# Patient Record
Sex: Male | Born: 1955 | Race: White | Hispanic: No | Smoking: Former smoker
Health system: Southern US, Community
[De-identification: ages and names within clinical notes are randomized; demographics above are authoritative.]

## PROBLEM LIST (undated history)

## (undated) DIAGNOSIS — M1990 Unspecified osteoarthritis, unspecified site: Secondary | ICD-10-CM

## (undated) DIAGNOSIS — I10 Essential (primary) hypertension: Secondary | ICD-10-CM

## (undated) HISTORY — PX: TOE FUSION: SHX1070

## (undated) HISTORY — PX: BACK SURGERY: SHX140

## (undated) HISTORY — PX: APPENDECTOMY: SHX54

## (undated) HISTORY — PX: OTHER SURGICAL HISTORY: SHX169

---

## 1997-04-06 HISTORY — PX: LUMBAR FUSION: SHX111

## 2007-07-28 ENCOUNTER — Ambulatory Visit: Payer: Self-pay | Admitting: Gastroenterology

## 2011-09-11 ENCOUNTER — Ambulatory Visit: Payer: Self-pay | Admitting: Orthopedic Surgery

## 2013-03-06 ENCOUNTER — Ambulatory Visit: Payer: Self-pay | Admitting: Family Medicine

## 2014-06-14 ENCOUNTER — Ambulatory Visit: Payer: Self-pay | Admitting: Family Medicine

## 2014-06-22 ENCOUNTER — Other Ambulatory Visit: Payer: Self-pay | Admitting: Orthopedic Surgery

## 2014-07-16 NOTE — H&P (Signed)
TOTAL KNEE ADMISSION H&P  Patient is being admitted for left total knee arthroplasty.  Subjective:  Chief Complaint:left knee pain.  HPI: Lawrence PaulsMarshall R Espy Jr., 59 y.o. male, has a history of pain and functional disability in the left knee due to arthritis and has failed non-surgical conservative treatments for greater than 12 weeks to includeNSAID's and/or analgesics, corticosteriod injections, viscosupplementation injections, flexibility and strengthening excercises, weight reduction as appropriate and activity modification.  Onset of symptoms was gradual, starting 4 years ago with gradually worsening course since that time. The patient noted prior procedures on the knee to include  nothing on the left knee(s).  Patient currently rates pain in the left knee(s) at 7 out of 10 with activity. Patient has night pain, worsening of pain with activity and weight bearing, pain that interferes with activities of daily living, pain with passive range of motion and joint swelling.  Patient has evidence of subchondral sclerosis, joint subluxation and joint space narrowing by imaging studies. This patient has had osteoarthritis. There is no active infection.  There are no active problems to display for this patient.  No past medical history on file.  No past surgical history on file.  No prescriptions prior to admission   Allergies not on file  History  Substance Use Topics  . Smoking status: Not on file  . Smokeless tobacco: Not on file  . Alcohol Use: Not on file    No family history on file.   Review of Systems  Constitutional: Negative.   HENT: Negative.   Eyes: Negative.   Respiratory: Negative.   Cardiovascular: Negative.   Gastrointestinal: Negative.   Genitourinary: Negative.   Musculoskeletal: Positive for back pain and joint pain.  Skin: Negative.   Neurological: Negative.   Endo/Heme/Allergies: Negative.     Objective:  Physical Exam  Constitutional: He is oriented to  person, place, and time. He appears well-developed.  HENT:  Head: Normocephalic.  Eyes: EOM are normal.  Neck: Normal range of motion.  Cardiovascular: Normal rate, regular rhythm, normal heart sounds and intact distal pulses.   Respiratory: Effort normal.  GI: Soft.  Genitourinary:  Deferred  Musculoskeletal:  Back pain with ROM. R&L knee pain with ROM. 4/5 strength with knee ROM. Calves soft and non-tender. Bilateral lower extremities grossly N/v intact.  Neurological: He is alert and oriented to person, place, and time. He has normal reflexes.  Skin: Skin is warm and dry.  Psychiatric: His behavior is normal.    Vital signs in last 24 hours:    Labs:   There is no height or weight on file to calculate BMI.   Imaging Review Plain radiographs demonstrate moderate degenerative joint disease of the left knee(s). The overall alignment ismild varus. The bone quality appears to be good for age and reported activity level.  Assessment/Plan:  End stage arthritis, left knee   The patient history, physical examination, clinical judgment of the provider and imaging studies are consistent with end stage degenerative joint disease of the left knee(s) and total knee arthroplasty is deemed medically necessary. The treatment options including medical management, injection therapy arthroscopy and arthroplasty were discussed at length. The risks and benefits of total knee arthroplasty were presented and reviewed. The risks due to aseptic loosening, infection, stiffness, patella tracking problems, thromboembolic complications and other imponderables were discussed. The patient acknowledged the explanation, agreed to proceed with the plan and consent was signed. Patient is being admitted for inpatient treatment for surgery, pain control, PT, OT, prophylactic antibiotics,  VTE prophylaxis, progressive ambulation and ADL's and discharge planning. The patient is planning to be discharged home with home  health services.  Contraindications and adverse affects of Tranexamic acid discussed in detail. Patient denies any of these at this time and understands the risks and benefits.

## 2014-08-01 ENCOUNTER — Other Ambulatory Visit (HOSPITAL_COMMUNITY): Payer: Self-pay | Admitting: *Deleted

## 2014-08-01 NOTE — Patient Instructions (Addendum)
Lawrence PaulsMarshall R Mcmullan Jr.  08/01/2014   Your procedure is scheduled on: Friday May 6th, 2015  [facility] and follow signs to               Short Stay Center at AM.  Call this number if you have problems the morning of surgery [phone]   Remember:  Do not eat food or drink liquids :After Midnight.     Take these medicines the morning of surgery with A SIP OF WATER: no meds to take                               You may not have any metal on your body including hair pins and              piercings  Do not wear jewelry, make-up, lotions, powders or perfumes.             Do not wear nail polish.  Do not shave  48 hours prior to surgery.              Men may shave face and neck.   Do not bring valuables to the hospital. [facility] IS NOT             RESPONSIBLE   FOR VALUABLES.  Contacts, dentures or bridgework may not be worn into surgery.  Leave suitcase in the car. After surgery it may be brought to your room.                 Please read over the following fact sheets you were given: _____________________________________________________________________             [facility] - Preparing [facility] Before surgery, you can play an important role.  Because skin is not sterile, your skin needs to be as free of germs as possible.  You can reduce the number of germs on your skin by washing with CHG (chlorahexidine gluconate) soap before surgery.  CHG is an antiseptic cleaner which kills germs and bonds with the skin to continue killing germs even after washing. Please DO NOT use if you have an allergy to CHG or antibacterial soaps.  If your skin becomes reddened/irritated stop using the CHG and inform your nurse when you arrive at Short Stay. Do not shave (including legs and underarms) for at least 48 hours prior to the first CHG shower.  You may shave your face/neck. Please follow these instructions carefully:  1.  Shower with CHG Soap  the night before surgery and the  morning of Surgery.  2.  If you choose to wash your hair, wash your hair first as usual with your  normal  shampoo.  3.  After you shampoo, rinse your hair and body thoroughly to remove the  shampoo.                           4.  Use CHG as you would any other liquid soap.  You can apply chg directly  to the skin and wash                       Gently with a scrungie or clean washcloth.  5.  Apply the CHG Soap to your body ONLY FROM THE NECK DOWN.  Do not use on face/ open                           Wound or open sores. Avoid contact with eyes, ears mouth and genitals (private parts).                       Wash face,  Genitals (private parts) with your normal soap.             6.  Wash thoroughly, paying special attention to the area where your surgery  will be performed.  7.  Thoroughly rinse your body with warm water from the neck down.  8.  DO NOT shower/wash with your normal soap after using and rinsing off  the CHG Soap.                9.  Pat yourself dry with a clean towel.            10.  Wear clean pajamas.            11.  Place clean sheets on your bed the night of your first shower and do not  sleep with pets. Day of Surgery : Do not apply any lotions/deodorants the morning of surgery.  Please wear clean clothes to the hospital/surgery center.  FAILURE TO FOLLOW THESE INSTRUCTIONS MAY RESULT IN THE CANCELLATION OF YOUR SURGERY PATIENT SIGNATURE_________________________________  NURSE SIGNATURE__________________________________  ________________________________________________________________________  WHAT IS A BLOOD TRANSFUSION? Blood Transfusion Information  A transfusion is the replacement of blood or some of its parts. Blood is made up of multiple cells which provide different functions.  Red blood cells carry oxygen and are used for blood loss replacement.  White blood cells fight against infection.  Platelets control bleeding.  Plasma  helps clot blood.  Other blood products are available for specialized needs, such as hemophilia or other clotting disorders. BEFORE THE TRANSFUSION  Who gives blood for transfusions?   Healthy volunteers who are fully evaluated to make sure their blood is safe. This is blood bank blood. Transfusion therapy is the safest it has ever been in the practice of medicine. Before blood is taken from a donor, a complete history is taken to make sure that person has no history of diseases nor engages in risky social behavior (examples are intravenous drug use or sexual activity with multiple partners). The donor's travel history is screened to minimize risk of transmitting infections, such as malaria. The donated blood is tested for signs of infectious diseases, such as HIV and hepatitis. The blood is then tested to be sure it is compatible with you in order to minimize the chance of a transfusion reaction. If you or a relative donates blood, this is often done in anticipation of surgery and is not appropriate for emergency situations. It takes many days to process the donated blood. RISKS AND COMPLICATIONS Although transfusion therapy is very safe and saves many lives, the main dangers of transfusion include:  1. Getting an infectious disease. 2. Developing a transfusion reaction. This is an allergic reaction to something in the blood you were given. Every precaution is taken to prevent this. The decision to have a blood transfusion has been considered carefully by your caregiver before blood is given. Blood is not given unless the benefits outweigh the risks. AFTER THE TRANSFUSION  Right after receiving a blood transfusion, you will usually feel much better and more energetic. This is especially  true if your red blood cells have gotten low (anemic). The transfusion raises the level of the red blood cells which carry oxygen, and this usually causes an energy increase.  The nurse administering the transfusion  will monitor you carefully for complications. HOME CARE INSTRUCTIONS  No special instructions are needed after a transfusion. You may find your energy is better. Speak with your caregiver about any limitations on activity for underlying diseases you may have. SEEK MEDICAL CARE IF:   Your condition is not improving after your transfusion.  You develop redness or irritation at the intravenous (IV) site. SEEK IMMEDIATE MEDICAL CARE IF:  Any of the following symptoms occur over the next 12 hours:  Shaking chills.  You have a temperature by mouth above 102 F (38.9 C), not controlled by medicine.  Chest, back, or muscle pain.  People around you feel you are not acting correctly or are confused.  Shortness of breath or difficulty breathing.  Dizziness and fainting.  You get a rash or develop hives.  You have a decrease in urine output.  Your urine turns a dark color or changes to pink, red, or brown. Any of the following symptoms occur over the next 10 days:  You have a temperature by mouth above 102 F (38.9 C), not controlled by medicine.  Shortness of breath.  Weakness after normal activity.  The white part of the eye turns yellow (jaundice).  You have a decrease in the amount of urine or are urinating less often.  Your urine turns a dark color or changes to pink, red, or brown. Document Released: 03/20/2000 Document Revised: 06/15/2011 Document Reviewed: 11/07/2007 ExitCare Patient Information 2014 [place].  _______________________________________________________________________  Incentive Spirometer  An incentive spirometer is a tool that can help keep your lungs clear and active. This tool measures how well you are filling your lungs with each breath. Taking long deep breaths may help reverse or decrease the chance of developing breathing (pulmonary) problems (especially infection) following:  A long period of time when you are unable to move or be  active. BEFORE THE PROCEDURE   If the spirometer includes an indicator to show your best effort, your nurse or respiratory therapist will set it to a desired goal.  If possible, sit up straight or lean slightly forward. Try not to slouch.  Hold the incentive spirometer in an upright position. INSTRUCTIONS FOR USE  3. Sit on the edge of your bed if possible, or sit up as far as you can in bed or on a chair. 4. Hold the incentive spirometer in an upright position. 5. Breathe out normally. 6. Place the mouthpiece in your mouth and seal your lips tightly around it. 7. Breathe in slowly and as deeply as possible, raising the piston or the ball toward the top of the column. 8. Hold your breath for 3-5 seconds or for as long as possible. Allow the piston or ball to fall to the bottom of the column. 9. Remove the mouthpiece from your mouth and breathe out normally. 10. Rest for a few seconds and repeat Steps 1 through 7 at least 10 times every 1-2 hours when you are awake. Take your time and take a few normal breaths between deep breaths. 11. The spirometer may include an indicator to show your best effort. Use the indicator as a goal to work toward during each repetition. 12. After each set of 10 deep breaths, practice coughing to be sure your lungs are clear. If you have  an incision (the cut made at the time of surgery), support your incision when coughing by placing a pillow or rolled up towels firmly against it. Once you are able to get out of bed, walk around indoors and cough well. You may stop using the incentive spirometer when instructed by your caregiver.  RISKS AND COMPLICATIONS  Take your time so you do not get dizzy or light-headed.  If you are in pain, you may need to take or ask for pain medication before doing incentive spirometry. It is harder to take a deep breath if you are having pain. AFTER USE  Rest and breathe slowly and easily.  It can be helpful to keep track of a log of  your progress. Your caregiver can provide you with a simple table to help with this. If you are using the spirometer at home, follow these instructions: [place] IF:   You are having difficultly using the spirometer.  You have trouble using the spirometer as often as instructed.  Your pain medication is not giving enough relief while using the spirometer.  You develop fever of 100.5 F (38.1 C) or higher. SEEK IMMEDIATE MEDICAL CARE IF:   You cough up bloody sputum that had not been present before.  You develop fever of 102 F (38.9 C) or greater.  You develop worsening pain at or near the incision site. MAKE SURE YOU:   Understand these instructions.  Will watch your condition.  Will get help right away if you are not doing well or get worse. Document Released: 08/03/2006 Document Revised: 06/15/2011 Document Reviewed: 10/04/2006 [facility] Patient Information 2014 [place], [place].   ________________________________________________________________________

## 2014-08-02 ENCOUNTER — Encounter (HOSPITAL_COMMUNITY): Payer: Self-pay

## 2014-08-02 ENCOUNTER — Encounter (HOSPITAL_COMMUNITY)
Admission: RE | Admit: 2014-08-02 | Discharge: 2014-08-02 | Disposition: A | Discharge status: Home / Self Care | Payer: BLUE CROSS/BLUE SHIELD | Source: Ambulatory Visit | Attending: Specialist | Admitting: Specialist

## 2014-08-02 DIAGNOSIS — Z01812 Encounter for preprocedural laboratory examination: Secondary | ICD-10-CM | POA: Diagnosis present

## 2014-08-02 DIAGNOSIS — M1712 Unilateral primary osteoarthritis, left knee: Secondary | ICD-10-CM | POA: Diagnosis not present

## 2014-08-02 HISTORY — DX: Essential (primary) hypertension: I10

## 2014-08-02 HISTORY — DX: Unspecified osteoarthritis, unspecified site: M19.90

## 2014-08-02 LAB — SURGICAL PCR SCREEN
MRSA, PCR: NEGATIVE
Staphylococcus aureus: POSITIVE — AB

## 2014-08-02 LAB — URINALYSIS, ROUTINE W REFLEX MICROSCOPIC
Bilirubin Urine: NEGATIVE
GLUCOSE, UA: NEGATIVE mg/dL
Hgb urine dipstick: NEGATIVE
KETONES UR: NEGATIVE mg/dL
Leukocytes, UA: NEGATIVE
NITRITE: NEGATIVE
Protein, ur: NEGATIVE mg/dL
Specific Gravity, Urine: 1.009 (ref 1.005–1.030)
UROBILINOGEN UA: 0.2 mg/dL (ref 0.0–1.0)
pH: 6 (ref 5.0–8.0)

## 2014-08-02 LAB — PROTIME-INR
INR: 0.99 (ref 0.00–1.49)
Prothrombin Time: 13.2 seconds (ref 11.6–15.2)

## 2014-08-02 LAB — CBC
HCT: 40.6 % (ref 39.0–52.0)
HEMOGLOBIN: 13.4 g/dL (ref 13.0–17.0)
MCH: 29.8 pg (ref 26.0–34.0)
MCHC: 33 g/dL (ref 30.0–36.0)
MCV: 90.4 fL (ref 78.0–100.0)
PLATELETS: 236 10*3/uL (ref 150–400)
RBC: 4.49 MIL/uL (ref 4.22–5.81)
RDW: 12.7 % (ref 11.5–15.5)
WBC: 7.3 10*3/uL (ref 4.0–10.5)

## 2014-08-02 LAB — BASIC METABOLIC PANEL
ANION GAP: 8 (ref 5–15)
BUN: 16 mg/dL (ref 6–23)
CALCIUM: 9.6 mg/dL (ref 8.4–10.5)
CHLORIDE: 105 mmol/L (ref 96–112)
CO2: 25 mmol/L (ref 19–32)
CREATININE: 0.8 mg/dL (ref 0.50–1.35)
GFR calc Af Amer: >90 mL/min (ref >90)
GFR calc non Af Amer: >90 mL/min (ref >90)
GLUCOSE: 106 mg/dL — AB (ref 70–99)
Potassium: 4.6 mmol/L (ref 3.5–5.1)
Sodium: 138 mmol/L (ref 135–145)

## 2014-08-02 LAB — ABO/RH: ABO/RH(D): B POS

## 2014-08-02 LAB — APTT: aPTT: 33 seconds (ref 24–37)

## 2014-08-08 NOTE — Progress Notes (Signed)
Spoke with patient by phone, patient advised RN blood braclet got wet, but had dried and was readable, patient advised to keep blood braclet dry as possible prior to surgery and short stay would check blood braclet. day of surgery

## 2014-08-09 MED ORDER — DEXTROSE 5 % IV SOLN
3.0000 g | INTRAVENOUS | Status: AC
  Administered 2014-08-10: 3 g via INTRAVENOUS
  Filled 2014-08-09 (×2): qty 3000

## 2014-08-10 ENCOUNTER — Encounter (HOSPITAL_COMMUNITY): Payer: Self-pay | Admitting: *Deleted

## 2014-08-10 ENCOUNTER — Inpatient Hospital Stay (HOSPITAL_COMMUNITY): Payer: BLUE CROSS/BLUE SHIELD | Admitting: Anesthesiology

## 2014-08-10 ENCOUNTER — Inpatient Hospital Stay (HOSPITAL_COMMUNITY)
Admission: RE | Admit: 2014-08-10 | Discharge: 2014-08-12 | DRG: 470 | Disposition: A | Discharge status: Home Health | Payer: BLUE CROSS/BLUE SHIELD | Source: Ambulatory Visit | Attending: Specialist | Admitting: Specialist

## 2014-08-10 ENCOUNTER — Encounter (HOSPITAL_COMMUNITY)
Admission: RE | Disposition: A | Discharge status: Home Health | Payer: Self-pay | Source: Ambulatory Visit | Attending: Specialist

## 2014-08-10 DIAGNOSIS — Z01812 Encounter for preprocedural laboratory examination: Secondary | ICD-10-CM

## 2014-08-10 DIAGNOSIS — E669 Obesity, unspecified: Secondary | ICD-10-CM | POA: Diagnosis present

## 2014-08-10 DIAGNOSIS — M25562 Pain in left knee: Secondary | ICD-10-CM | POA: Diagnosis present

## 2014-08-10 DIAGNOSIS — I10 Essential (primary) hypertension: Secondary | ICD-10-CM | POA: Diagnosis present

## 2014-08-10 DIAGNOSIS — M1712 Unilateral primary osteoarthritis, left knee: Principal | ICD-10-CM | POA: Diagnosis present

## 2014-08-10 DIAGNOSIS — Z6837 Body mass index (BMI) 37.0-37.9, adult: Secondary | ICD-10-CM | POA: Diagnosis not present

## 2014-08-10 HISTORY — PX: TOTAL KNEE ARTHROPLASTY: SHX125

## 2014-08-10 LAB — TYPE AND SCREEN
ABO/RH(D): B POS
Antibody Screen: NEGATIVE

## 2014-08-10 SURGERY — ARTHROPLASTY, KNEE, TOTAL
Anesthesia: Spinal | Site: Knee | Laterality: Left

## 2014-08-10 MED ORDER — OXYCODONE HCL ER 20 MG PO T12A
40.0000 mg | EXTENDED_RELEASE_TABLET | Freq: Two times a day (BID) | ORAL | Status: DC
  Administered 2014-08-10 – 2014-08-12 (×5): 40 mg via ORAL
  Filled 2014-08-10 (×5): qty 2

## 2014-08-10 MED ORDER — ONDANSETRON HCL 4 MG/2ML IJ SOLN
INTRAMUSCULAR | Status: DC | PRN
  Administered 2014-08-10: 4 mg via INTRAVENOUS

## 2014-08-10 MED ORDER — MIDAZOLAM HCL 2 MG/2ML IJ SOLN
INTRAMUSCULAR | Status: AC
  Filled 2014-08-10: qty 2

## 2014-08-10 MED ORDER — FENTANYL CITRATE (PF) 250 MCG/5ML IJ SOLN
INTRAMUSCULAR | Status: DC | PRN
  Administered 2014-08-10: 100 ug via INTRAVENOUS

## 2014-08-10 MED ORDER — ONDANSETRON HCL 4 MG PO TABS: 4.0000 mg | ORAL_TABLET | Freq: Four times a day (QID) | ORAL | Status: DC | PRN

## 2014-08-10 MED ORDER — ONDANSETRON HCL 4 MG/2ML IJ SOLN: 4.0000 mg | Freq: Four times a day (QID) | INTRAMUSCULAR | Status: DC | PRN

## 2014-08-10 MED ORDER — KETOROLAC TROMETHAMINE 30 MG/ML IJ SOLN
INTRAMUSCULAR | Status: AC
  Filled 2014-08-10: qty 1

## 2014-08-10 MED ORDER — BUPIVACAINE-EPINEPHRINE 0.25% -1:200000 IJ SOLN
INTRAMUSCULAR | Status: DC | PRN
  Administered 2014-08-10: 30 mL

## 2014-08-10 MED ORDER — POTASSIUM CHLORIDE IN NACL 20-0.9 MEQ/L-% IV SOLN
INTRAVENOUS | Status: DC
  Administered 2014-08-10: 14:00:00 via INTRAVENOUS
  Filled 2014-08-10 (×5): qty 1000

## 2014-08-10 MED ORDER — PROPOFOL 10 MG/ML IV BOLUS
INTRAVENOUS | Status: AC
  Filled 2014-08-10: qty 20

## 2014-08-10 MED ORDER — POLYETHYLENE GLYCOL 3350 17 G PO PACK: 17.0000 g | PACK | Freq: Every day | ORAL | Status: DC | PRN

## 2014-08-10 MED ORDER — LACTATED RINGERS IV SOLN
INTRAVENOUS | Status: DC | PRN
  Administered 2014-08-10 (×2): via INTRAVENOUS

## 2014-08-10 MED ORDER — SODIUM CHLORIDE 0.9 % IV SOLN: INTRAVENOUS | Status: DC

## 2014-08-10 MED ORDER — BUPIVACAINE HCL (PF) 0.75 % IJ SOLN
INTRAMUSCULAR | Status: DC | PRN
  Administered 2014-08-10: 15 mg

## 2014-08-10 MED ORDER — 0.9 % SODIUM CHLORIDE (POUR BTL) OPTIME
TOPICAL | Status: DC | PRN
  Administered 2014-08-10: 1000 mL

## 2014-08-10 MED ORDER — DOCUSATE SODIUM 100 MG PO CAPS
100.0000 mg | ORAL_CAPSULE | Freq: Two times a day (BID) | ORAL | Status: DC
  Administered 2014-08-10 – 2014-08-12 (×4): 100 mg via ORAL

## 2014-08-10 MED ORDER — FENTANYL CITRATE (PF) 100 MCG/2ML IJ SOLN: 25.0000 ug | INTRAMUSCULAR | Status: DC | PRN

## 2014-08-10 MED ORDER — MENTHOL 3 MG MT LOZG: 1.0000 | LOZENGE | OROMUCOSAL | Status: DC | PRN

## 2014-08-10 MED ORDER — ZOLPIDEM TARTRATE 5 MG PO TABS: 5.0000 mg | ORAL_TABLET | Freq: Every evening | ORAL | Status: DC | PRN

## 2014-08-10 MED ORDER — DEXAMETHASONE SODIUM PHOSPHATE 10 MG/ML IJ SOLN
10.0000 mg | Freq: Once | INTRAMUSCULAR | Status: AC
  Administered 2014-08-11: 10 mg via INTRAVENOUS
  Filled 2014-08-10: qty 1

## 2014-08-10 MED ORDER — METHOCARBAMOL 500 MG PO TABS
500.0000 mg | ORAL_TABLET | Freq: Four times a day (QID) | ORAL | Status: DC | PRN
  Administered 2014-08-10 – 2014-08-12 (×5): 500 mg via ORAL
  Filled 2014-08-10 (×6): qty 1

## 2014-08-10 MED ORDER — SODIUM CHLORIDE 0.9 % IJ SOLN
INTRAMUSCULAR | Status: AC
  Filled 2014-08-10: qty 50

## 2014-08-10 MED ORDER — OXYCODONE HCL 5 MG PO TABS
10.0000 mg | ORAL_TABLET | Freq: Three times a day (TID) | ORAL | Status: DC
  Administered 2014-08-10 – 2014-08-12 (×7): 10 mg via ORAL
  Filled 2014-08-10 (×8): qty 2

## 2014-08-10 MED ORDER — PHENOL 1.4 % MT LIQD
1.0000 | OROMUCOSAL | Status: DC | PRN
  Filled 2014-08-10: qty 177

## 2014-08-10 MED ORDER — LISINOPRIL 20 MG PO TABS
20.0000 mg | ORAL_TABLET | Freq: Every day | ORAL | Status: DC
  Administered 2014-08-10 – 2014-08-11 (×2): 20 mg via ORAL
  Filled 2014-08-10 (×4): qty 1

## 2014-08-10 MED ORDER — METHOCARBAMOL 1000 MG/10ML IJ SOLN
500.0000 mg | Freq: Four times a day (QID) | INTRAVENOUS | Status: DC | PRN
  Administered 2014-08-10: 500 mg via INTRAVENOUS
  Filled 2014-08-10 (×2): qty 5

## 2014-08-10 MED ORDER — ACETAMINOPHEN 325 MG PO TABS
650.0000 mg | ORAL_TABLET | Freq: Four times a day (QID) | ORAL | Status: DC | PRN
  Administered 2014-08-11 – 2014-08-12 (×2): 650 mg via ORAL
  Filled 2014-08-10 (×2): qty 2

## 2014-08-10 MED ORDER — DIPHENHYDRAMINE HCL 12.5 MG/5ML PO ELIX
12.5000 mg | ORAL_SOLUTION | ORAL | Status: DC | PRN
Start: 2014-08-10 — End: 2014-08-12

## 2014-08-10 MED ORDER — METOCLOPRAMIDE HCL 5 MG/ML IJ SOLN
5.0000 mg | Freq: Three times a day (TID) | INTRAMUSCULAR | Status: DC | PRN
Start: 2014-08-10 — End: 2014-08-12

## 2014-08-10 MED ORDER — KETOROLAC TROMETHAMINE 30 MG/ML IJ SOLN
INTRAMUSCULAR | Status: DC | PRN
  Administered 2014-08-10: 30 mg via INTRAVENOUS

## 2014-08-10 MED ORDER — ONDANSETRON HCL 4 MG/2ML IJ SOLN: 4.0000 mg | Freq: Once | INTRAMUSCULAR | Status: DC | PRN

## 2014-08-10 MED ORDER — FLUTICASONE PROPIONATE 50 MCG/ACT NA SUSP
1.0000 | Freq: Every evening | NASAL | Status: DC
  Filled 2014-08-10: qty 16

## 2014-08-10 MED ORDER — BUPIVACAINE-EPINEPHRINE (PF) 0.25% -1:200000 IJ SOLN
INTRAMUSCULAR | Status: AC
  Filled 2014-08-10: qty 30

## 2014-08-10 MED ORDER — TRANEXAMIC ACID 1000 MG/10ML IV SOLN
1000.0000 mg | INTRAVENOUS | Status: AC
  Administered 2014-08-10: 1000 mg via INTRAVENOUS
  Filled 2014-08-10: qty 10

## 2014-08-10 MED ORDER — ALUM & MAG HYDROXIDE-SIMETH 200-200-20 MG/5ML PO SUSP: 30.0000 mL | ORAL | Status: DC | PRN

## 2014-08-10 MED ORDER — ENOXAPARIN SODIUM 30 MG/0.3ML ~~LOC~~ SOLN
30.0000 mg | Freq: Two times a day (BID) | SUBCUTANEOUS | Status: DC
  Administered 2014-08-11 – 2014-08-12 (×3): 30 mg via SUBCUTANEOUS
  Filled 2014-08-10 (×5): qty 0.3

## 2014-08-10 MED ORDER — ACETAMINOPHEN 650 MG RE SUPP: 650.0000 mg | Freq: Four times a day (QID) | RECTAL | Status: DC | PRN

## 2014-08-10 MED ORDER — DOXAZOSIN MESYLATE 8 MG PO TABS
8.0000 mg | ORAL_TABLET | Freq: Every day | ORAL | Status: DC
  Administered 2014-08-10 – 2014-08-11 (×2): 8 mg via ORAL
  Filled 2014-08-10 (×4): qty 1

## 2014-08-10 MED ORDER — MIDAZOLAM HCL 5 MG/5ML IJ SOLN
INTRAMUSCULAR | Status: DC | PRN
  Administered 2014-08-10 (×2): 0.5 mg via INTRAVENOUS
  Administered 2014-08-10: 2 mg via INTRAVENOUS
  Administered 2014-08-10 (×2): 0.5 mg via INTRAVENOUS

## 2014-08-10 MED ORDER — HYDROMORPHONE HCL 1 MG/ML IJ SOLN
1.0000 mg | INTRAMUSCULAR | Status: DC | PRN
  Administered 2014-08-10 – 2014-08-11 (×10): 1 mg via INTRAVENOUS
  Filled 2014-08-10 (×10): qty 1

## 2014-08-10 MED ORDER — ACETAMINOPHEN 10 MG/ML IV SOLN
1000.0000 mg | Freq: Once | INTRAVENOUS | Status: AC
  Administered 2014-08-10: 1000 mg via INTRAVENOUS
  Filled 2014-08-10: qty 100

## 2014-08-10 MED ORDER — PROPOFOL 10 MG/ML IV BOLUS
INTRAVENOUS | Status: DC | PRN
  Administered 2014-08-10: 20 mg via INTRAVENOUS

## 2014-08-10 MED ORDER — CEFAZOLIN SODIUM-DEXTROSE 2-3 GM-% IV SOLR
2.0000 g | Freq: Four times a day (QID) | INTRAVENOUS | Status: AC
  Administered 2014-08-10 (×2): 2 g via INTRAVENOUS
  Filled 2014-08-10 (×2): qty 50

## 2014-08-10 MED ORDER — FENTANYL CITRATE (PF) 100 MCG/2ML IJ SOLN
INTRAMUSCULAR | Status: AC
  Filled 2014-08-10: qty 2

## 2014-08-10 MED ORDER — POVIDONE-IODINE 7.5 % EX SOLN: Freq: Once | CUTANEOUS | Status: DC

## 2014-08-10 MED ORDER — DEXAMETHASONE SODIUM PHOSPHATE 10 MG/ML IJ SOLN
10.0000 mg | Freq: Once | INTRAMUSCULAR | Status: AC
  Administered 2014-08-10: 10 mg via INTRAVENOUS

## 2014-08-10 MED ORDER — SODIUM CHLORIDE 0.9 % IR SOLN
Status: DC | PRN
  Administered 2014-08-10: 1000 mL

## 2014-08-10 MED ORDER — BISACODYL 10 MG RE SUPP: 10.0000 mg | Freq: Every day | RECTAL | Status: DC | PRN

## 2014-08-10 MED ORDER — FERROUS SULFATE 325 (65 FE) MG PO TABS
325.0000 mg | ORAL_TABLET | Freq: Three times a day (TID) | ORAL | Status: DC
  Administered 2014-08-10 – 2014-08-12 (×5): 325 mg via ORAL
  Filled 2014-08-10 (×8): qty 1

## 2014-08-10 MED ORDER — GEMFIBROZIL 600 MG PO TABS
600.0000 mg | ORAL_TABLET | Freq: Two times a day (BID) | ORAL | Status: DC
  Administered 2014-08-10 – 2014-08-12 (×4): 600 mg via ORAL
  Filled 2014-08-10 (×7): qty 1

## 2014-08-10 MED ORDER — PROPOFOL INFUSION 10 MG/ML OPTIME
INTRAVENOUS | Status: DC | PRN
  Administered 2014-08-10: 160 ug/kg/min via INTRAVENOUS

## 2014-08-10 MED ORDER — LINACLOTIDE 145 MCG PO CAPS
145.0000 ug | ORAL_CAPSULE | ORAL | Status: DC | PRN
  Filled 2014-08-10: qty 1

## 2014-08-10 MED ORDER — FENOFIBRATE 54 MG PO TABS
54.0000 mg | ORAL_TABLET | Freq: Every day | ORAL | Status: DC
  Filled 2014-08-10 (×2): qty 1

## 2014-08-10 MED ORDER — METOCLOPRAMIDE HCL 10 MG PO TABS: 5.0000 mg | ORAL_TABLET | Freq: Three times a day (TID) | ORAL | Status: DC | PRN

## 2014-08-10 MED ORDER — MAGNESIUM CITRATE PO SOLN: 1.0000 | Freq: Once | ORAL | Status: AC | PRN

## 2014-08-10 MED ORDER — ONDANSETRON HCL 4 MG/2ML IJ SOLN
INTRAMUSCULAR | Status: AC
  Filled 2014-08-10: qty 2

## 2014-08-10 MED ORDER — SODIUM CHLORIDE 0.9 % IJ SOLN
INTRAMUSCULAR | Status: DC | PRN
  Administered 2014-08-10: 50 mL

## 2014-08-10 MED ORDER — DEXAMETHASONE SODIUM PHOSPHATE 10 MG/ML IJ SOLN
INTRAMUSCULAR | Status: AC
  Filled 2014-08-10: qty 1

## 2014-08-10 SURGICAL SUPPLY — 64 items
BAG ZIPLOCK 12X15 (MISCELLANEOUS) ×4 IMPLANT
BANDAGE ELASTIC 4 VELCRO ST LF (GAUZE/BANDAGES/DRESSINGS) ×2 IMPLANT
BANDAGE ELASTIC 6 VELCRO ST LF (GAUZE/BANDAGES/DRESSINGS) ×2 IMPLANT
BANDAGE ESMARK 6X9 LF (GAUZE/BANDAGES/DRESSINGS) ×1 IMPLANT
BLADE SAG 18X100X1.27 (BLADE) ×2 IMPLANT
BLADE SAW SGTL 13.0X1.19X90.0M (BLADE) ×2 IMPLANT
BNDG ESMARK 6X9 LF (GAUZE/BANDAGES/DRESSINGS) ×2
CAP KNEE TOTAL 3 SIGMA ×2 IMPLANT
CEMENT HV SMART SET (Cement) ×4 IMPLANT
CUFF TOURN SGL QUICK 34 (TOURNIQUET CUFF) ×1
CUFF TRNQT CYL 34X4X40X1 (TOURNIQUET CUFF) ×1 IMPLANT
DERMABOND ADVANCED (GAUZE/BANDAGES/DRESSINGS) ×1
DERMABOND ADVANCED .7 DNX12 (GAUZE/BANDAGES/DRESSINGS) ×1 IMPLANT
DRAPE EXTREMITY T 121X128X90 (DRAPE) ×2 IMPLANT
DRAPE POUCH INSTRU U-SHP 10X18 (DRAPES) ×2 IMPLANT
DRAPE SHEET LG 3/4 BI-LAMINATE (DRAPES) ×2 IMPLANT
DRAPE U-SHAPE 47X51 STRL (DRAPES) ×2 IMPLANT
DRSG AQUACEL AG ADV 3.5X10 (GAUZE/BANDAGES/DRESSINGS) ×2 IMPLANT
DRSG OPSITE POSTOP 4X6 (GAUZE/BANDAGES/DRESSINGS) ×2 IMPLANT
DRSG TEGADERM 4X4.75 (GAUZE/BANDAGES/DRESSINGS) ×2 IMPLANT
DURAPREP 26ML APPLICATOR (WOUND CARE) ×2 IMPLANT
ELECT REM PT RETURN 9FT ADLT (ELECTROSURGICAL) ×2
ELECTRODE REM PT RTRN 9FT ADLT (ELECTROSURGICAL) ×1 IMPLANT
EVACUATOR 1/8 PVC DRAIN (DRAIN) ×2 IMPLANT
FACESHIELD WRAPAROUND (MASK) ×10 IMPLANT
GAUZE SPONGE 2X2 8PLY STRL LF (GAUZE/BANDAGES/DRESSINGS) ×1 IMPLANT
GLOVE BIOGEL PI IND STRL 8 (GLOVE) ×2 IMPLANT
GLOVE BIOGEL PI INDICATOR 8 (GLOVE) ×2
GLOVE SURG ORTHO 8.0 STRL STRW (GLOVE) ×2 IMPLANT
GLOVE SURG ORTHO 9.0 STRL STRW (GLOVE) ×2 IMPLANT
GLOVE SURG SS PI 7.5 STRL IVOR (GLOVE) ×2 IMPLANT
GOWN STRL REUS W/TWL XL LVL3 (GOWN DISPOSABLE) ×4 IMPLANT
HANDPIECE INTERPULSE COAX TIP (DISPOSABLE) ×1
IMMOBILIZER KNEE 20 (SOFTGOODS) ×2 IMPLANT
KIT BASIN OR (CUSTOM PROCEDURE TRAY) ×2 IMPLANT
LIQUID BAND (GAUZE/BANDAGES/DRESSINGS) ×2 IMPLANT
NDL SAFETY ECLIPSE 18X1.5 (NEEDLE) ×1 IMPLANT
NEEDLE HYPO 18GX1.5 SHARP (NEEDLE) ×1
NS IRRIG 1000ML POUR BTL (IV SOLUTION) ×2 IMPLANT
PACK TOTAL JOINT (CUSTOM PROCEDURE TRAY) ×2 IMPLANT
POSITIONER SURGICAL ARM (MISCELLANEOUS) ×2 IMPLANT
SET HNDPC FAN SPRY TIP SCT (DISPOSABLE) ×1 IMPLANT
SET PAD KNEE POSITIONER (MISCELLANEOUS) ×2 IMPLANT
SPONGE GAUZE 2X2 STER 10/PKG (GAUZE/BANDAGES/DRESSINGS) ×1
SPONGE LAP 18X18 X RAY DECT (DISPOSABLE) IMPLANT
SPONGE SURGIFOAM ABS GEL 100 (HEMOSTASIS) IMPLANT
STOCKINETTE 6  STRL (DRAPES) ×1
STOCKINETTE 6 STRL (DRAPES) ×1 IMPLANT
SUCTION FRAZIER 12FR DISP (SUCTIONS) ×2 IMPLANT
SUT BONE WAX W31G (SUTURE) IMPLANT
SUT MNCRL AB 3-0 PS2 18 (SUTURE) ×2 IMPLANT
SUT VIC AB 1 CT1 27 (SUTURE) ×4
SUT VIC AB 1 CT1 27XBRD ANTBC (SUTURE) ×4 IMPLANT
SUT VIC AB 2-0 CT1 27 (SUTURE) ×3
SUT VIC AB 2-0 CT1 TAPERPNT 27 (SUTURE) ×3 IMPLANT
SUT VLOC 180 0 24IN GS25 (SUTURE) ×2 IMPLANT
SYR 50ML LL SCALE MARK (SYRINGE) ×2 IMPLANT
TAPE STRIPS DRAPE STRL (GAUZE/BANDAGES/DRESSINGS) ×2 IMPLANT
TOWEL OR 17X26 10 PK STRL BLUE (TOWEL DISPOSABLE) ×2 IMPLANT
TOWEL OR NON WOVEN STRL DISP B (DISPOSABLE) IMPLANT
TOWER CARTRIDGE SMART MIX (DISPOSABLE) ×2 IMPLANT
TRAY FOLEY W/METER SILVER 16FR (SET/KITS/TRAYS/PACK) ×2 IMPLANT
WATER STERILE IRR 1500ML POUR (IV SOLUTION) ×2 IMPLANT
WRAP KNEE MAXI GEL POST OP (GAUZE/BANDAGES/DRESSINGS) ×2 IMPLANT

## 2014-08-10 NOTE — Evaluation (Signed)
Physical Therapy Evaluation Patient Details Name: Lawrence PaulsMarshall R Quam Jr. MRN: [id number] DOB: 1955-12-15 Today's Date: 08/10/2014   History of Present Illness  L TKR  Clinical Impression  Pt s/p L TKR presents with decreased L LE strength/ROM and post op pain limiting functional mobility.  Pt should progress well to dc home with family assist and HHPT follow up.    Follow Up Recommendations Home health PT    Equipment Recommendations  None recommended by PT    Recommendations for Other Services OT consult     Precautions / Restrictions Precautions Precautions: Knee;Fall Required Braces or Orthoses: Knee Immobilizer - Left Knee Immobilizer - Left: Discontinue once straight leg raise with < 10 degree lag Restrictions Weight Bearing Restrictions: No LLE Weight Bearing: Weight bearing as tolerated      Mobility  Bed Mobility Overal bed mobility: Needs Assistance Bed Mobility: Supine to Sit     Supine to sit: Min assist     General bed mobility comments: cues for sequence and use of R LE to self assist  Transfers Overall transfer level: Needs assistance Equipment used: Rolling walker (2 wheeled) Transfers: Sit to/from Stand Sit to Stand: Min assist         General transfer comment: cues for LE management and use of UEs to self assist  Ambulation/Gait Ambulation/Gait assistance: Min assist Ambulation Distance (Feet): 35 Feet Assistive device: Rolling walker (2 wheeled) Gait Pattern/deviations: Step-to pattern;Decreased step length - right;Decreased step length - left;Shuffle;Trunk flexed Gait velocity: decr   General Gait Details: cues for sequence, posture and position from [employer]  Stairs            Wheelchair Mobility    Modified Rankin (Stroke Patients Only)       Balance                                             Pertinent Vitals/Pain Pain Assessment: 0-10 Pain Score: 5  Pain Location: L knee Pain Descriptors /  Indicators: Aching;Sore Pain Intervention(s): Limited activity within patient's tolerance;Monitored during session;Premedicated before session;Ice applied    Home Living Family/patient expects to be discharged to:: Private residence Living Arrangements: Spouse/significant other Available Help at Discharge: Family Type of Home: House Home Access: Stairs to enter Entrance Stairs-Rails: Right Entrance Stairs-Number of Steps: 4 Home Layout: Two level Home Equipment: Environmental consultantWalker - 2 wheels;Cane - single point;Crutches      Prior Function Level of Independence: Independent               Hand Dominance        Extremity/Trunk Assessment   Upper Extremity Assessment: Overall WFL for tasks assessed           Lower Extremity Assessment: LLE deficits/detail      Cervical / Trunk Assessment: Normal  Communication   Communication: No difficulties  Cognition Arousal/Alertness: Awake/alert Behavior During Therapy: WFL for tasks assessed/performed Overall Cognitive Status: Within Functional Limits for tasks assessed                      General Comments      Exercises        Assessment/Plan    PT Assessment Patient needs continued PT services  PT Diagnosis Difficulty walking   PT Problem List Decreased strength;Decreased range of motion;Decreased activity tolerance;Decreased mobility;Decreased knowledge of use of DME;Obesity;Pain  PT Treatment Interventions  DME instruction;Gait training;Stair training;Functional mobility training;Therapeutic activities;Therapeutic exercise;Patient/family education   PT Goals (Current goals can be found in the Care Plan section) Acute Rehab PT Goals Patient Stated Goal: Resume previous lifestyle with decreased pain PT Goal Formulation: With patient Time For Goal Achievement: 08/14/14 Potential to Achieve Goals: Good    Frequency 7X/week   Barriers to discharge        Co-evaluation               End of Session  Equipment Utilized During Treatment: Left knee immobilizer;Gait belt Activity Tolerance: Patient tolerated treatment well Patient left: in chair;with call bell/phone within reach Nurse Communication: Mobility status         Time: [id number] PT Time Calculation (min) (ACUTE ONLY): 27 min   Charges:   PT Evaluation $Initial PT Evaluation Tier I: 1 Procedure PT Treatments $Gait Training: 8-22 mins   PT G Codes:        Doyne Ellinger 08/10/2014, 6:43 PM

## 2014-08-10 NOTE — Anesthesia Procedure Notes (Addendum)
Procedure Name: MAC Date/Time: 08/10/2014 8:29 AM Performed by: Dione Booze Pre-anesthesia Checklist: Patient identified, Emergency Drugs available, Suction available and Patient being monitored Oxygen Delivery Method: Simple face mask Placement Confirmation: positive ETCO2   Spinal Patient location during procedure: OR Staffing Anesthesiologist: Yeraldi Fidler Performed by: anesthesiologist  Preanesthetic Checklist Completed: patient identified, site marked, surgical consent, pre-op evaluation, timeout performed, IV checked, risks and benefits discussed and monitors and equipment checked Spinal Block Patient position: sitting Prep: Betadine Patient monitoring: heart rate, continuous pulse ox and blood pressure Location: L3-4 Injection technique: single-shot Needle Needle type: Sprotte  Needle gauge: 24 G Needle length: 9 cm Additional Notes Expiration date of kit checked and confirmed. Patient tolerated procedure well, without complications.  Functioning IV was confirmed and monitors were applied. Sterile prep and drape, including hand hygiene, mask and sterile gloves were used. The patient was positioned and the spine was prepped. The skin was anesthetized with lidocaine.  Free flow of clear CSF was obtained prior to injecting local anesthetic into the CSF.  The spinal needle aspirated freely following injection.  The needle was carefully withdrawn.  The patient tolerated the procedure well. Consent was obtained prior to procedure with all questions answered and concerns addressed. Risks including but not limited to bleeding, infection, nerve damage, paralysis, failed block, inadequate analgesia, allergic reaction, high spinal, itching and headache were discussed and the patient wished to proceed.   Lauretta Grill, MD

## 2014-08-10 NOTE — Interval H&P Note (Signed)
History and Physical Interval Note:  08/10/2014 8:11 AM  Lawrence PaulsMarshall R Clairmont Jr.  has presented today for surgery, with the diagnosis of LEFT KNEE OA   The various methods of treatment have been discussed with the patient and family. After consideration of risks, benefits and other options for treatment, the patient has consented to  Procedure(s): LEFT TOTAL KNEE ARTHROPLASTY (Left) as a surgical intervention .  The patient's history has been reviewed, patient examined, no change in status, stable for surgery.  I have reviewed the patient's chart and labs.  Questions were answered to the patient's satisfaction.     Rickayla Wieland ANDREW

## 2014-08-10 NOTE — Progress Notes (Signed)
Asked patient to have family member bring a copy of advance directive.

## 2014-08-10 NOTE — Anesthesia Postprocedure Evaluation (Signed)
  Anesthesia Post-op Note  Patient: Lawrence PaulsMarshall R Nancarrow Jr.  Procedure(s) Performed: Procedure(s) (LRB): LEFT TOTAL KNEE ARTHROPLASTY (Left)  Patient Location: PACU  Anesthesia Type: Spinal  Level of Consciousness: awake and alert   Airway and Oxygen Therapy: Patient Spontanous Breathing  Post-op Pain: mild  Post-op Assessment: Post-op Vital signs reviewed, Patient's Cardiovascular Status Stable, Respiratory Function Stable, Patent Airway and No signs of Nausea or vomiting  Last Vitals:  Filed Vitals:   08/10/14 1305  BP: 153/70  Pulse: 86  Temp: 36.7 C  Resp: 16    Post-op Vital Signs: stable   Complications: No apparent anesthesia complications

## 2014-08-10 NOTE — Op Note (Signed)
DATE OF SURGERY:  08/10/2014  TIME: 10:39 AM  PATIENT NAME:  Lawrence PaulsMarshall R Mendia Jr.    AGE: 59 y.o.   PRE-OPERATIVE DIAGNOSIS:  LEFT KNEE OSTEOARTHRITIS   POST-OPERATIVE DIAGNOSIS:  LEFT KNEE OSTEOARTHRITIS  PROCEDURE:  Procedure(s): LEFT TOTAL KNEE ARTHROPLASTY  SURGEON:  Valbuena Luckenbach ANDREW  ASSISTANT:  Bryson Stilwell, PA-C, present and scrubbed throughout the case, critical for assistance with exposure, retraction, instrumentation, and closure.  OPERATIVE IMPLANTS: Depuy PFC Sigma Rotating Platform.  Femur size 5, Tibia size 5, Patella size 38 3-peg oval button, with a 10 mm polyethylene insert.   PREOPERATIVE INDICATIONS:   Lawrence PaulsMarshall R [name] is a 59 y.o. year old male with end stage bone on bone arthritis of the knee who failed conservative treatment and elected for Total Knee Arthroplasty.   The risks, benefits, and alternatives were discussed at length including but not limited to the risks of infection, bleeding, nerve injury, stiffness, blood clots, the need for revision surgery, cardiopulmonary complications, among others, and they were willing to proceed.  OPERATIVE DESCRIPTION:  The patient was brought to the operative room and placed in a supine position.  Spinal anesthesia was administered.  IV antibiotics were given.  The lower extremity was prepped and draped in the usual sterile fashion.  Time out was performed.  The leg was elevated and exsanguinated and the tourniquet was inflated.  Anterior quadriceps tendon splitting approach was performed.  The patella was retracted and osteophytes were removed.  The anterior horn of the medial and lateral meniscus was removed and cruciate ligaments resected.   The distal femur was opened with the drill and the intramedullary distal femoral cutting jig was utilized, set at 5 degrees resecting 10 mm off the distal femur.  Care was taken to protect the collateral ligaments.  The distal femoral sizing jig was applied,  taking care to avoid notching.  Then the 4-in-1 cutting jig was applied and the anterior and posterior femur was cut, along with the chamfer cuts.    Then the extramedullary tibial cutting jig was utilized making the appropriate cut using the anterior tibial crest as a reference building in appropriate posterior slope.  Care was taken during the cut to protect the medial and collateral ligaments.  The proximal tibia was removed along with the posterior horns of the menisci.   The posterior medial femoral osteophytes and posterior lateral femoral osteophytes were removed.    The flexion gap was then measured and was symmetric with the extension gap, measured at 10.  I completed the distal femoral preparation using the appropriate jig to prepare the box.  The patella was then measured, and cut with the saw.    The proximal tibia sized and prepared accordingly with the reamer and the punch, and then all components were trialed with the trial insert.  The knee was found to have excellent balance and full motion.    The above named components were then cemented into place and all excess cement was removed.  The trial polyethylene component was in place during cementation, and then was exchanged for the real polyethylene component.    The knee was easily taken through a range of motion and the patella tracked well and the knee irrigated copiously and the parapatellar and subcutaneous tissue closed with vicryl, and monocryl with steri strips for the skin.  The arthrotomy was closed at 90 of flexion. The wounds were dressed with sterile gauze and the tourniquet released and the patient was awakened and returned  to the PACU in stable and satisfactory condition.  There were no complications.  Total tourniquet time was 110 minutes.60cc marcaine and saline and toradol periosteal injection. Vloc capsule closure.

## 2014-08-10 NOTE — Transfer of Care (Signed)
Immediate Anesthesia Transfer of Care Note  Patient: Lawrence PaulsMarshall R Soliday Jr.  Procedure(s) Performed: Procedure(s): LEFT TOTAL KNEE ARTHROPLASTY (Left)  Patient Location: PACU  Anesthesia Type:MAC and Spinal  Level of Consciousness: awake, alert , oriented and patient cooperative  Airway & Oxygen Therapy: Patient Spontanous Breathing and Patient connected to face mask oxygen  Post-op Assessment: Report given to RN and Post -op Vital signs reviewed and stable  Post vital signs: Reviewed and stable  Last Vitals:  Filed Vitals:   08/10/14 0602  BP: 149/78  Pulse: 90  Temp: 36.9 C  Resp: 18    Complications: No apparent anesthesia complications

## 2014-08-10 NOTE — Progress Notes (Signed)
Utilization review completed.  

## 2014-08-10 NOTE — Anesthesia Preprocedure Evaluation (Addendum)
Anesthesia Evaluation  Patient identified by MRN, date of birth, ID band Patient awake    Reviewed: Allergy & Precautions, NPO status , Patient's Chart, lab work & pertinent test results  History of Anesthesia Complications Negative for: history of anesthetic complications  Airway Mallampati: II  TM Distance: >3 FB Neck ROM: Full    Dental no notable dental hx. (+) Dental Advisory Given   Pulmonary neg pulmonary ROS,  breath sounds clear to auscultation  Pulmonary exam normal       Cardiovascular hypertension, Pt. on medications Normal cardiovascular examRhythm:Regular Rate:Normal     Neuro/Psych negative neurological ROS  negative psych ROS   GI/Hepatic negative GI ROS, Neg liver ROS,   Endo/Other  obesity  Renal/GU negative Renal ROS  negative genitourinary   Musculoskeletal  (+) Arthritis -,   Abdominal   Peds negative pediatric ROS (+)  Hematology negative hematology ROS (+)   Anesthesia Other Findings   Reproductive/Obstetrics negative OB ROS                            Anesthesia Physical Anesthesia Plan  ASA: II  Anesthesia Plan: Spinal   Post-op Pain Management:    Induction:   Airway Management Planned:   Additional Equipment:   Intra-op Plan:   Post-operative Plan:   Informed Consent: I have reviewed the patients History and Physical, chart, labs and discussed the procedure including the risks, benefits and alternatives for the proposed anesthesia with the patient or authorized representative who has indicated his/her understanding and acceptance.   Dental advisory given  Plan Discussed with: CRNA  Anesthesia Plan Comments:         Anesthesia Quick Evaluation

## 2014-08-11 LAB — BASIC METABOLIC PANEL
Anion gap: 7 (ref 5–15)
BUN: 14 mg/dL (ref 6–20)
CO2: 25 mmol/L (ref 22–32)
CREATININE: 0.75 mg/dL (ref 0.61–1.24)
Calcium: 8.8 mg/dL — ABNORMAL LOW (ref 8.9–10.3)
Chloride: 103 mmol/L (ref 101–111)
GFR calc Af Amer: >60 mL/min (ref >60)
GFR calc non Af Amer: >60 mL/min (ref >60)
Glucose, Bld: 145 mg/dL — ABNORMAL HIGH (ref 70–99)
POTASSIUM: 4.3 mmol/L (ref 3.5–5.1)
SODIUM: 135 mmol/L (ref 135–145)

## 2014-08-11 LAB — CBC
HCT: 35.6 % — ABNORMAL LOW (ref 39.0–52.0)
Hemoglobin: 11.6 g/dL — ABNORMAL LOW (ref 13.0–17.0)
MCH: 29.7 pg (ref 26.0–34.0)
MCHC: 32.6 g/dL (ref 30.0–36.0)
MCV: 91.3 fL (ref 78.0–100.0)
Platelets: 225 10*3/uL (ref 150–400)
RBC: 3.9 MIL/uL — AB (ref 4.22–5.81)
RDW: 12.7 % (ref 11.5–15.5)
WBC: 14.6 10*3/uL — ABNORMAL HIGH (ref 4.0–10.5)

## 2014-08-11 MED ORDER — METHOCARBAMOL 500 MG PO TABS: 500.0000 mg | ORAL_TABLET | Freq: Three times a day (TID) | ORAL | Status: DC | PRN

## 2014-08-11 MED ORDER — OXYCODONE HCL 10 MG PO TABS: 10.0000 mg | ORAL_TABLET | Freq: Three times a day (TID) | ORAL | Status: DC

## 2014-08-11 MED ORDER — ASPIRIN EC 325 MG PO TBEC: 325.0000 mg | DELAYED_RELEASE_TABLET | Freq: Two times a day (BID) | ORAL | Status: DC

## 2014-08-11 NOTE — Progress Notes (Signed)
Physical Therapy Treatment Patient Details Name: Lawrence PaulsMarshall R Palmisano Jr. MRN: [id number] DOB: 09-15-1955 Today's Date: 08/11/2014    History of Present Illness L TKR    PT Comments    Steady progress with mobility.  Pt is hopeful for dc tomorrow.  Follow Up Recommendations  Home health PT     Equipment Recommendations  None recommended by PT    Recommendations for Other Services OT consult     Precautions / Restrictions Precautions Precautions: Knee;Fall Required Braces or Orthoses: Knee Immobilizer - Left Knee Immobilizer - Left: Discontinue once straight leg raise with < 10 degree lag Restrictions Weight Bearing Restrictions: No LLE Weight Bearing: Weight bearing as tolerated    Mobility  Bed Mobility Overal bed mobility: Needs Assistance Bed Mobility: Sit to Supine;Supine to Sit     Supine to sit: Min assist Sit to supine: Min assist   General bed mobility comments: cues for sequence and use of R LE to self assist  Transfers Overall transfer level: Needs assistance Equipment used: Rolling walker (2 wheeled) Transfers: Sit to/from Stand Sit to Stand: Min guard         General transfer comment: cues for LE management and use of UEs to self assist  Ambulation/Gait Ambulation/Gait assistance: Min guard Ambulation Distance (Feet): 111 Feet Assistive device: Rolling walker (2 wheeled) Gait Pattern/deviations: Step-to pattern;Decreased step length - right;Decreased step length - left;Shuffle;Trunk flexed Gait velocity: decr   General Gait Details: cues for sequence, posture and position from [employer]   Stairs            Wheelchair Mobility    Modified Rankin (Stroke Patients Only)       Balance                                    Cognition Arousal/Alertness: Awake/alert Behavior During Therapy: WFL for tasks assessed/performed Overall Cognitive Status: Within Functional Limits for tasks assessed                       Exercises Total Joint Exercises Ankle Circles/Pumps: AROM;Both;15 reps;Supine Quad Sets: AROM;Both;10 reps;Supine Heel Slides: AAROM;Left;10 reps;Supine    General Comments        Pertinent Vitals/Pain Pain Assessment: 0-10 Pain Score: 7  Pain Location: L knee Pain Descriptors / Indicators: Aching;Sore Pain Intervention(s): Limited activity within patient's tolerance;Monitored during session;Premedicated before session;Ice applied    Home Living                      Prior Function            PT Goals (current goals can now be found in the care plan section) Acute Rehab PT Goals Patient Stated Goal: Resume previous lifestyle with decreased pain PT Goal Formulation: With patient Time For Goal Achievement: 08/14/14 Potential to Achieve Goals: Good Progress towards PT goals: Progressing toward goals    Frequency  7X/week    PT Plan Current plan remains appropriate    Co-evaluation             End of Session Equipment Utilized During Treatment: Left knee immobilizer;Gait belt Activity Tolerance: Patient tolerated treatment well Patient left: in bed;with call bell/phone within reach;with family/visitor present     Time: [id number] PT Time Calculation (min) (ACUTE ONLY): 24 min  Charges:  $Gait Training: 8-22 mins $Therapeutic Exercise: 8-22 mins  G Codes:      Lemma Tetro 08/11/2014, 5:25 PM

## 2014-08-11 NOTE — Care Management Note (Signed)
Case Management Note  Patient Details  Name: Lawrence PaulsMarshall R Lavell Jr. MRN: [id number] Date of Birth: 01-15-1956  Subjective/Objective:                    Action/Plan:   Expected Discharge Date:  08/12/14               Expected Discharge Plan:  Home w Home Health Services   Discharge planning Services  CM Consult   [facility]  [facility]  Status of Service:     Medicare Important Message Given:  No Date Medicare IM Given:    Medicare IM give by:    Date Additional Medicare IM Given:    Additional Medicare Important Message give by:     If discussed at Long Length of Stay Meetings, dates discussed:    Additional Comments: N[facility] spoke to pt and [facility] Pt states he has RW and crutches at home. Does not want a bedside commode at this time. [facility] Lawrence DonningAlesia Oluwanifemi Petitti RN CCM Case Mgmt phone [phone] Elliot CousinShavis, [name] [name], RN 08/11/2014, 4:32 PM

## 2014-08-11 NOTE — Progress Notes (Signed)
Subjective: 1 Day Post-Op Procedure(s) (LRB): LEFT TOTAL KNEE ARTHROPLASTY (Left) Patient reports pain as mild to the left knee. Progressing with PT. Tolerating PO's. No N/V. Denies SOB, CP, or calf pain.  Objective: Vital signs in last 24 hours: Temp:  [97.5 F (36.4 C)-98.8 F (37.1 C)] 98.4 F (36.9 C) (05/07 0623) Pulse Rate:  [74-107] 91 (05/07 0623) Resp:  [11-20] 18 (05/07 0623) BP: (110-153)/(65-74) 139/73 mmHg (05/07 0623) SpO2:  [95 %-100 %] 97 % (05/07 0623)  Intake/Output from previous day: 05/06 0701 - 05/07 0700 In: 2395 [P.O.:840; I.V.:1500; IV Piggyback:55] Out: 3036 [Urine:2810; Drains:226] Intake/Output this shift:     Recent Labs  08/11/14 0400  HGB 11.6*    Recent Labs  08/11/14 0400  WBC 14.6*  RBC 3.90*  HCT 35.6*  PLT 225    Recent Labs  08/11/14 0400  NA 135  K 4.3  CL 103  CO2 25  BUN 14  CREATININE 0.75  GLUCOSE 145*  CALCIUM 8.8*   No results for input(s): LABPT, INR in the last 72 hours.  Alert and oriented x3. RRR, Lungs clear, BS x4. Left Calf soft and non tender. L knee dressing C/D/I. No DVT signs. No signs of infection or compartment syndrome. LLE grossly neurovascularly intact.  Drain in left knee d/c'ed. Tip was intact. Pt tolerated well. Hemostasis obtained.  Assessment/Plan: 1 Day Post-Op Procedure(s) (LRB): LEFT TOTAL KNEE ARTHROPLASTY (Left) Up with PT Plan D/c home tomorrow Rx Done and instructions Drain D/c'ed Continue current care  STILWELL, BRYSON L 08/11/2014, 7:39 AM

## 2014-08-11 NOTE — Progress Notes (Signed)
Physical Therapy Treatment Patient Details Name: Lawrence PaulsMarshall R Wichman Jr. MRN: [id number] DOB: 02-12-1956 Today's Date: 08/11/2014    History of Present Illness L TKR    PT Comments    Pt very motivated and progressing well with mobility this am.  Pt hopeful for dc home tomorrow  Follow Up Recommendations  Home health PT     Equipment Recommendations  None recommended by PT    Recommendations for Other Services OT consult     Precautions / Restrictions Precautions Precautions: Knee;Fall Required Braces or Orthoses: Knee Immobilizer - Left Knee Immobilizer - Left: Discontinue once straight leg raise with < 10 degree lag Restrictions Weight Bearing Restrictions: No LLE Weight Bearing: Weight bearing as tolerated    Mobility  Bed Mobility Overal bed mobility: Needs Assistance Bed Mobility: Supine to Sit     Supine to sit: Min assist     General bed mobility comments: cues for sequence and use of R LE to self assist  Transfers Overall transfer level: Needs assistance Equipment used: Rolling walker (2 wheeled) Transfers: Sit to/from Stand Sit to Stand: Min assist         General transfer comment: cues for LE management and use of UEs to self assist  Ambulation/Gait Ambulation/Gait assistance: Min assist;Min guard Ambulation Distance (Feet): 74 Feet Assistive device: Rolling walker (2 wheeled) Gait Pattern/deviations: Step-to pattern;Decreased step length - right;Decreased step length - left;Shuffle;Trunk flexed Gait velocity: decr   General Gait Details: cues for sequence, posture and position from [employer]   Stairs            Wheelchair Mobility    Modified Rankin (Stroke Patients Only)       Balance                                    Cognition Arousal/Alertness: Awake/alert Behavior During Therapy: WFL for tasks assessed/performed Overall Cognitive Status: Within Functional Limits for tasks assessed                      Exercises Total Joint Exercises Ankle Circles/Pumps: AROM;Both;15 reps;Supine Quad Sets: AROM;Both;10 reps;Supine Heel Slides: AAROM;Left;10 reps;Supine Straight Leg Raises: AAROM;Left;10 reps;Supine Goniometric ROM: AAROM at L knee -10 - 45    General Comments        Pertinent Vitals/Pain Pain Assessment: 0-10 Pain Score: 7  Pain Location: L knee Pain Descriptors / Indicators: Aching;Sore Pain Intervention(s): Limited activity within patient's tolerance;Monitored during session;Premedicated before session;Ice applied    Home Living                      Prior Function            PT Goals (current goals can now be found in the care plan section) Acute Rehab PT Goals Patient Stated Goal: Resume previous lifestyle with decreased pain PT Goal Formulation: With patient Time For Goal Achievement: 08/14/14 Potential to Achieve Goals: Good    Frequency  7X/week    PT Plan Current plan remains appropriate    Co-evaluation             End of Session Equipment Utilized During Treatment: Left knee immobilizer;Gait belt Activity Tolerance: Patient tolerated treatment well Patient left: in chair;with call bell/phone within reach     Time: 0808-0840 PT Time Calculation (min) (ACUTE ONLY): 32 min  Charges:  $Gait Training: 8-22 mins $Therapeutic Exercise: 8-22 mins  G Codes:      Yobana Culliton 08/11/2014, 9:42 AM

## 2014-08-12 LAB — CBC
HCT: 36.6 % — ABNORMAL LOW (ref 39.0–52.0)
Hemoglobin: 12.6 g/dL — ABNORMAL LOW (ref 13.0–17.0)
MCH: 31 pg (ref 26.0–34.0)
MCHC: 34.4 g/dL (ref 30.0–36.0)
MCV: 89.9 fL (ref 78.0–100.0)
PLATELETS: 237 10*3/uL (ref 150–400)
RBC: 4.07 MIL/uL — AB (ref 4.22–5.81)
RDW: 12.6 % (ref 11.5–15.5)
WBC: 13.2 10*3/uL — AB (ref 4.0–10.5)

## 2014-08-12 NOTE — Discharge Summary (Signed)
Physician Discharge Summary   Patient ID: Lawrence PaulsMarshall R Gorelik Jr. MRN: [id number] DOB/AGE: 06/04/1955 59 y.o.  Admit date: 08/10/2014 Discharge date: 08/12/2014  Admission Diagnoses:  Active Problems:   Primary osteoarthritis of left knee   Status post total left knee replacement using cement   S/P knee surgery   Discharge Diagnoses:  Same   Surgeries: Procedure(s): LEFT TOTAL KNEE ARTHROPLASTY on 08/10/2014   Consultants: PT/OT  Discharged Condition: Stable  Hospital Course: Lawrence PaulsMarshall R Darrough Jr. is an 59 y.o. male who was admitted 08/10/2014 with a chief complaint of No chief complaint on file. , and found to have a diagnosis of <principal problem not specified>.  They were brought to the operating room on 08/10/2014 and underwent the above named procedures.    The patient had an uncomplicated hospital course and was stable for discharge.  Recent vital signs:  Filed Vitals:   08/12/14 0527  BP: 151/65  Pulse: 102  Temp: 98.1 F (36.7 C)  Resp: 18    Recent laboratory studies:  Results for orders placed or performed during the hospital encounter of 08/10/14  Urinalysis, Routine w reflex microscopic  Result Value Ref Range   Color, Urine YELLOW YELLOW   APPearance CLEAR CLEAR   Specific Gravity, Urine 1.009 1.005 - 1.030   pH 6.0 5.0 - 8.0   Glucose, UA NEGATIVE NEGATIVE mg/dL   Hgb urine dipstick NEGATIVE NEGATIVE   Bilirubin Urine NEGATIVE NEGATIVE   Ketones, ur NEGATIVE NEGATIVE mg/dL   Protein, ur NEGATIVE NEGATIVE mg/dL   Urobilinogen, UA 0.2 0.0 - 1.0 mg/dL   Nitrite NEGATIVE NEGATIVE   Leukocytes, UA NEGATIVE NEGATIVE  Basic metabolic panel  Result Value Ref Range   Sodium 135 135 - 145 mmol/L   Potassium 4.3 3.5 - 5.1 mmol/L   Chloride 103 101 - 111 mmol/L   CO2 25 22 - 32 mmol/L   Glucose, Bld 145 (H) 70 - 99 mg/dL   BUN 14 6 - 20 mg/dL   Creatinine, Ser 4.090.75 0.61 - 1.24 mg/dL   Calcium 8.8 (L) 8.9 - 10.3 mg/dL   GFR calc non Af Amer >60 >60 mL/min    GFR calc Af Amer >60 >60 mL/min   Anion gap 7 5 - 15  CBC  Result Value Ref Range   WBC 14.6 (H) 4.0 - 10.5 K/uL   RBC 3.90 (L) 4.22 - 5.81 MIL/uL   Hemoglobin 11.6 (L) 13.0 - 17.0 g/dL   HCT 81.135.6 (L) 91.439.0 - 78.252.0 %   MCV 91.3 78.0 - 100.0 fL   MCH 29.7 26.0 - 34.0 pg   MCHC 32.6 30.0 - 36.0 g/dL   RDW 95.612.7 21.311.5 - 08.615.5 %   Platelets 225 150 - 400 K/uL  CBC  Result Value Ref Range   WBC 13.2 (H) 4.0 - 10.5 K/uL   RBC 4.07 (L) 4.22 - 5.81 MIL/uL   Hemoglobin 12.6 (L) 13.0 - 17.0 g/dL   HCT 57.836.6 (L) 46.939.0 - 62.952.0 %   MCV 89.9 78.0 - 100.0 fL   MCH 31.0 26.0 - 34.0 pg   MCHC 34.4 30.0 - 36.0 g/dL   RDW 52.812.6 41.311.5 - 24.415.5 %   Platelets 237 150 - 400 K/uL    Discharge Medications:     Medication List    TAKE these medications        aspirin EC 325 MG tablet  Take 1 tablet (325 mg total) by mouth 2 (two) times daily.     doxazosin  8 MG tablet  Commonly known as:  CARDURA  Take 8 mg by mouth at bedtime.     Fenofibrate 50 MG Caps  Take 1 capsule by mouth daily.     gemfibrozil 600 MG tablet  Commonly known as:  LOPID  Take 600 mg by mouth 2 (two) times daily before a meal.     Linaclotide 145 MCG Caps capsule  Commonly known as:  LINZESS  Take 145 mcg by mouth as needed.     lisinopril 20 MG tablet  Commonly known as:  PRINIVIL,ZESTRIL  Take 20 mg by mouth at bedtime.     methocarbamol 500 MG tablet  Commonly known as:  ROBAXIN  Take 1 tablet (500 mg total) by mouth every 8 (eight) hours as needed for muscle spasms.     multivitamin with minerals Tabs tablet  Take 1 tablet by mouth daily.     OxyCODONE 40 mg T12a 12 hr tablet  Commonly known as:  OXYCONTIN  Take 40 mg by mouth every 12 (twelve) hours.     Oxycodone HCl 10 MG Tabs  Take 1-1.5 tablets (10-15 mg total) by mouth 3 (three) times daily.     UNABLE TO FIND  - Biotin  - 2 tabs per day        Diagnostic Studies: No results found.  Disposition: Final discharge disposition not confirmed       Discharge Instructions    Call MD / Call 911    Complete by:  As directed   If you experience chest pain or shortness of breath, CALL 911 and be transported to the hospital emergency room.  If you develope a fever above 101 F, pus (white drainage) or increased drainage or redness at the wound, or calf pain, call your surgeon's office.     Constipation Prevention    Complete by:  As directed   Drink plenty of fluids.  Prune juice may be helpful.  You may use a stool softener, such as Colace (over the counter) 100 mg twice a day.  Use MiraLax (over the counter) for constipation as needed.     Diet - low sodium heart healthy    Complete by:  As directed      Discharge instructions    Complete by:  As directed   INSTRUCTIONS AFTER JOINT REPLACEMENT   Remove items at home which could result in a fall. This includes throw rugs or furniture in walking pathways ICE to the affected joint every three hours while awake for 30 minutes at a time, for at least the first 3-5 days, and then as needed for pain and swelling.  Continue to use ice for pain and swelling. You may notice swelling that will progress down to the foot and ankle.  This is normal after surgery.  Elevate your leg when you are not up walking on it.   Continue to use the breathing machine you got in the hospital (incentive spirometer) which will help keep your temperature down.  It is common for your temperature to cycle up and down following surgery, especially at night when you are not up moving around and exerting yourself.  The breathing machine keeps your lungs expanded and your temperature down.   DIET:  As you were doing prior to hospitalization, we recommend a well-balanced diet.  DRESSING / WOUND CARE / SHOWERING   Leave dressing in place, may remove the drain site dressing on the side. Place a band aid and neosporin over the  site. May shower.  ACTIVITY  Increase activity slowly as tolerated, but follow the weight bearing  instructions below.   No driving for 6 weeks or until further direction given by your physician.  You cannot drive while taking narcotics.  No lifting or carrying greater than 10 lbs. until further directed by your surgeon. Avoid periods of inactivity such as sitting longer than an hour when not asleep. This helps prevent blood clots.  You may return to work once you are authorized by your doctor.     WEIGHT BEARING   As tolerated, unless directed otherwise. Use assistive devices for safety.     EXERCISES  Results after joint replacement surgery are often greatly improved when you follow the exercise, range of motion and muscle strengthening exercises prescribed by your doctor. Safety measures are also important to protect the joint from further injury. Any time any of these exercises cause you to have increased pain or swelling, decrease what you are doing until you are comfortable again and then slowly increase them. If you have problems or questions, call your caregiver or physical therapist for advice.   Rehabilitation is important following a joint replacement. After just a few days of immobilization, the muscles of the leg can become weakened and shrink (atrophy).  These exercises are designed to build up the tone and strength of the thigh and leg muscles and to improve motion. Often times heat used for twenty to thirty minutes before working out will loosen up your tissues and help with improving the range of motion but do not use heat for the first two weeks following surgery (sometimes heat can increase post-operative swelling).   These exercises can be done on a training (exercise) mat, on the floor, on a table or on a bed. Use whatever works the best and is most comfortable for you.    Use music or television while you are exercising so that the exercises are a pleasant break in your day. This will make your life better with the exercises acting as a break in your routine that you can  look forward to.   Perform all exercises about fifteen times, three times per day or as directed.  You should exercise both the operative leg and the other leg as well.    Exercises include:   Quad Sets - Tighten up the muscle on the front of the thigh (Quad) and hold for 5-10 seconds.   Straight Leg Raises - With your knee straight (if you were given a brace, keep it on), lift the leg to 60 degrees, hold for 3 seconds, and slowly lower the leg.  Perform this exercise against resistance later as your leg gets stronger.  Leg Slides: Lying on your back, slowly slide your foot toward your buttocks, bending your knee up off the floor (only go as far as is comfortable). Then slowly slide your foot back down until your leg is flat on the floor again.  Angel Wings: Lying on your back spread your legs to the side as far apart as you can without causing discomfort.  Hamstring Strength:  Lying on your back, push your heel against the floor with your leg straight by tightening up the muscles of your buttocks.  Repeat, but this time bend your knee to a comfortable angle, and push your heel against the floor.  You may put a pillow under the heel to make it more comfortable if necessary.   A rehabilitation program following joint replacement surgery can  speed recovery and prevent re-injury in the future due to weakened muscles. Contact your doctor or a physical therapist for more information on knee rehabilitation.    CONSTIPATION  Constipation is defined medically as fewer than three stools per week and severe constipation as less than one stool per week.  Even if you have a regular bowel pattern at home, your normal regimen is likely to be disrupted due to multiple reasons following surgery.  Combination of anesthesia, postoperative narcotics, change in appetite and fluid intake all can affect your bowels.   YOU MUST use at least one of the following options; they are listed in order of increasing strength to  get the job done.  They are all available over the counter, and you may need to use some, POSSIBLY even all of these options:    Drink plenty of fluids (prune juice may be helpful) and high fiber foods Colace 100 mg by mouth twice a day  Senokot for constipation as directed and as needed Dulcolax (bisacodyl), take with full glass of water  Miralax (polyethylene glycol) once or twice a day as needed.  If you have tried all these things and are unable to have a bowel movement in the first 3-4 days after surgery call either your surgeon or your primary doctor.    If you experience loose stools or diarrhea, hold the medications until you stool forms back up.  If your symptoms do not get better within 1 week or if they get worse, check with your doctor.  If you experience "the worst abdominal pain ever" or develop nausea or vomiting, please contact the office immediately for further recommendations for treatment.   ITCHING:  If you experience itching with your medications, try taking only a single pain pill, or even half a pain pill at a time.  You can also use Benadryl over the counter for itching or also to help with sleep.   TED HOSE STOCKINGS:  Use stockings on both legs until for at least 2 weeks or as directed by physician office. They may be removed at night for sleeping.  MEDICATIONS:  See your medication summary on the "After Visit Summary" that nursing will review with you.  You may have some home medications which will be placed on hold until you complete the course of blood thinner medication.  It is important for you to complete the blood thinner medication as prescribed.  PRECAUTIONS:  If you experience chest pain or shortness of breath - call 911 immediately for transfer to the hospital emergency department.   If you develop a fever greater that 101 F, purulent drainage from wound, increased redness or drainage from wound, foul odor from the wound/dressing, or calf pain - CONTACT YOUR  SURGEON.                                                   FOLLOW-UP APPOINTMENTS:  If you do not already have a post-op appointment, please call the office [phone] for an appointment to be seen by your surgeon.  Guidelines for how soon to be seen are listed in your "After Visit Summary", but are typically between 1-4 weeks after surgery.  OTHER INSTRUCTIONS:   Knee Replacement:  Do not place pillow under knee, focus on keeping the knee straight while resting. CPM instructions: 0-90 degrees, 2 hours in  the morning, 2 hours in the afternoon, and 2 hours in the evening. Place foam block, curve side up under heel at all times except when in CPM or when walking.  DO NOT modify, tear, cut, or change the foam block in any way.  MAKE SURE YOU:  Understand these instructions.  Get help right away if you are not doing well or get worse.    Thank you for letting us be a part of your medical care team.  It is a privilege we respect greatly.  We hope these instructions will help you stay on track for a fast and full recovery!     Do not put a pillow under the knee. Place it under the heel.    Complete by:  As directed      Increase activity slowly as tolerated    Complete by:  As directed      TED hose    Complete by:  As directed   Use stockings (TED hose) for 2 weeks on both leg(s).  You may remove them at night for sleeping.           Follow-up Information    Follow up with [facility].   Why:  Home Health Physical Therapy   Contact information:   [address] [place] [address] [phone]        Signed: Thea Gist 08/12/2014, 6:54 AM

## 2014-08-12 NOTE — Plan of Care (Signed)
Problem: Discharge Progression Outcomes Goal: Anticoagulant follow-up in place Outcome: Not Applicable Date Met:  08/12/14 asa     

## 2014-08-12 NOTE — Progress Notes (Signed)
   Subjective: 2 Days Post-Op Procedure(s) (LRB): LEFT TOTAL KNEE ARTHROPLASTY (Left)  Pt doing well  Ready for d/c  Patient reports pain as mild.  Objective:   VITALS:   Filed Vitals:   08/12/14 0527  BP: 151/65  Pulse: 102  Temp: 98.1 F (36.7 C)  Resp: 18    Left knee ace removed aquacel in place nv intact distally No rashes   LABS  Recent Labs  08/11/14 0400 08/12/14 0522  HGB 11.6* 12.6*  HCT 35.6* 36.6*  WBC 14.6* PENDING  PLT 225 PENDING     Recent Labs  08/11/14 0400  NA 135  K 4.3  BUN 14  CREATININE 0.75  GLUCOSE 145*     Assessment/Plan: 2 Days Post-Op Procedure(s) (LRB): LEFT TOTAL KNEE ARTHROPLASTY (Left) Pt doing well D/c after therapy today F/u in 2 weeks    Alphonsa OverallBrad Maryum Batterson, MPAS, PA-C  08/12/2014, 6:52 AM

## 2014-08-12 NOTE — Progress Notes (Signed)
Discharged from floor via w/c, wife & belongings with pt. No changes in assessment. Lawrence Thornton  

## 2014-08-12 NOTE — Progress Notes (Signed)
Physical Therapy Treatment Patient Details Name: Lawrence Thornton. MRN: [id number] DOB: [birth year] Today's Date: 08/12/2014    History of Present Illness L TKR    PT Comments    Progressing well with mobility including stairs.  Follow Up Recommendations  Home health PT     Equipment Recommendations  None recommended by PT    Recommendations for Other Services OT consult     Precautions / Restrictions Precautions Precautions: Knee;Fall Required Braces or Orthoses: Knee Immobilizer - Left Knee Immobilizer - Left: Discontinue once straight leg raise with < 10 degree lag (Pt performed IND SLR x 10 this am) Restrictions Weight Bearing Restrictions: No LLE Weight Bearing: Weight bearing as tolerated    Mobility  Bed Mobility Overal bed mobility: Needs Assistance Bed Mobility: Sit to Supine;Supine to Sit     Supine to sit: Supervision Sit to supine: Supervision   General bed mobility comments: min cues for sequence and use of R LE to self assist  Transfers Overall transfer level: Needs assistance Equipment used: Rolling walker (2 wheeled) Transfers: Sit to/from Stand Sit to Stand: Supervision         General transfer comment: min cues for LE management and use of UEs to self assist  Ambulation/Gait Ambulation/Gait assistance: Supervision Ambulation Distance (Feet): 111 Feet Assistive device: Rolling walker (2 wheeled) Gait Pattern/deviations: Step-to pattern;Decreased step length - right;Decreased step length - left;Shuffle;Trunk flexed Gait velocity: decr   General Gait Details: cues for sequence, posture and position from RW   Stairs Stairs: Yes Stairs assistance: Min guard Stair Management: No rails;One rail Right;Step to pattern;Forwards;With crutches Number of Stairs: 8 General stair comments: 4 stairs with rail and crutch, 4 stairs with Bil crutches.  Cues for sequence and foot/crutch placement  Wheelchair Mobility    Modified Rankin  (Stroke Patients Only)       Balance                                    Cognition Arousal/Alertness: Awake/alert Behavior During Therapy: WFL for tasks assessed/performed Overall Cognitive Status: Within Functional Limits for tasks assessed                      Exercises Total Joint Exercises Ankle Circles/Pumps: AROM;Both;15 reps;Supine Quad Sets: AROM;Both;Supine;20 reps Heel Slides: AAROM;Left;Supine;20 reps Straight Leg Raises: Left;Supine;20 reps;AAROM;AROM Goniometric ROM: AAROM at L knee -10- 60    General Comments        Pertinent Vitals/Pain Pain Assessment: 0-10 Pain Score: 5  Pain Location: L knee Pain Descriptors / Indicators: Aching;Sore Pain Intervention(s): Limited activity within patient's tolerance;Monitored during session;Premedicated before session;Ice applied    Home Living                      Prior Function            PT Goals (current goals can now be found in the care plan section) Acute Rehab PT Goals Patient Stated Goal: Resume previous lifestyle with decreased pain PT Goal Formulation: With patient Time For Goal Achievement: 08/14/14 Potential to Achieve Goals: Good Progress towards PT goals: Progressing toward goals    Frequency  7X/week    PT Plan Current plan remains appropriate    Co-evaluation             End of Session Equipment Utilized During Treatment: Gait belt Activity Tolerance: Patient tolerated treatment well Patient  left: in bed;with call bell/phone within reach;with family/visitor present     Time: 0902-0943 PT Time Calculation (min) (ACUTE ONLY): 41 min  Charges:  $Gait Training: 8-22 mins $Therapeutic Exercise: 8-22 mins $Therapeutic Activity: 8-22 mins                    G Codes:      Lawrence Thornton 08/12/2014, 12:09 PM

## 2014-08-12 NOTE — Progress Notes (Signed)
Pt. Does not want OT at this time. Pt. States he has had multiple back surgeries and is used to doing for himself. Pt. States that he has A at home and will be fine. OT to sign off.

## 2014-09-21 ENCOUNTER — Encounter: Payer: Self-pay | Admitting: Family Medicine

## 2014-09-21 ENCOUNTER — Ambulatory Visit: Payer: Self-pay | Admitting: Family Medicine

## 2014-09-21 ENCOUNTER — Ambulatory Visit (INDEPENDENT_AMBULATORY_CARE_PROVIDER_SITE_OTHER): Payer: BLUE CROSS/BLUE SHIELD | Admitting: Family Medicine

## 2014-09-21 VITALS — BP 130/60 | HR 78 | Resp 16 | Ht 71.5 in | Wt 281.9 lb

## 2014-09-21 DIAGNOSIS — N529 Male erectile dysfunction, unspecified: Secondary | ICD-10-CM | POA: Insufficient documentation

## 2014-09-21 DIAGNOSIS — N401 Enlarged prostate with lower urinary tract symptoms: Secondary | ICD-10-CM | POA: Diagnosis not present

## 2014-09-21 DIAGNOSIS — G8929 Other chronic pain: Secondary | ICD-10-CM | POA: Diagnosis not present

## 2014-09-21 DIAGNOSIS — M545 Low back pain: Secondary | ICD-10-CM | POA: Diagnosis not present

## 2014-09-21 DIAGNOSIS — M5136 Other intervertebral disc degeneration, lumbar region: Secondary | ICD-10-CM | POA: Insufficient documentation

## 2014-09-21 DIAGNOSIS — M51369 Other intervertebral disc degeneration, lumbar region without mention of lumbar back pain or lower extremity pain: Secondary | ICD-10-CM | POA: Insufficient documentation

## 2014-09-21 DIAGNOSIS — R351 Nocturia: Secondary | ICD-10-CM | POA: Diagnosis not present

## 2014-09-21 DIAGNOSIS — F1120 Opioid dependence, uncomplicated: Secondary | ICD-10-CM | POA: Insufficient documentation

## 2014-09-21 DIAGNOSIS — E785 Hyperlipidemia, unspecified: Secondary | ICD-10-CM | POA: Diagnosis not present

## 2014-09-21 MED ORDER — OXYCODONE HCL ER 40 MG PO T12A: 40.0000 mg | EXTENDED_RELEASE_TABLET | Freq: Two times a day (BID) | ORAL | Status: DC

## 2014-09-21 MED ORDER — GEMFIBROZIL 600 MG PO TABS: 600.0000 mg | ORAL_TABLET | Freq: Two times a day (BID) | ORAL | Status: DC

## 2014-09-21 MED ORDER — OXYCODONE-ACETAMINOPHEN 10-325 MG PO TABS: 1.0000 | ORAL_TABLET | Freq: Three times a day (TID) | ORAL | Status: DC | PRN

## 2014-09-21 MED ORDER — DOXAZOSIN MESYLATE 8 MG PO TABS: 8.0000 mg | ORAL_TABLET | Freq: Every day | ORAL | Status: DC

## 2014-09-21 NOTE — Progress Notes (Signed)
Name: Lawrence Thornton.   MRN: [id number]    DOB: [birth year]   Date:09/21/2014       Progress Note  Subjective  Chief Complaint  Chief Complaint  Patient presents with  . Pain    1 month chronic pain meds  . Hyperlipidemia    Hyperlipidemia This is a chronic problem. The problem is controlled. Recent lipid tests were reviewed and are normal. He has no history of chronic renal disease, diabetes, hypothyroidism or liver disease. Pertinent negatives include no leg pain. Current antihyperlipidemic treatment includes fibric acid derivatives. The current treatment provides moderate improvement of lipids. Risk factors for coronary artery disease include dyslipidemia, male sex and obesity.  Back Pain This is a chronic problem. Episode onset: approximately 20 years ago . Started in 1997. The problem occurs daily. The problem is unchanged. The pain is present in the lumbar spine. The quality of the pain is described as burning and shooting. The pain does not radiate. The pain is at a severity of 3/10. The pain is moderate. The symptoms are aggravated by bending, position and sitting. Associated symptoms include numbness (numbness in right big toe.). Pertinent negatives include no bladder incontinence, bowel incontinence, dysuria, fever, leg pain, pelvic pain or tingling. He has tried ice and analgesics for the symptoms. The treatment provided moderate relief.  Benign Prostatic Hypertrophy This is a chronic problem. The problem is unchanged. Irritative symptoms include nocturia. Irritative symptoms do not include frequency. Obstructive symptoms include a slower stream. Obstructive symptoms do not include incomplete emptying or straining. Pertinent negatives include no chills, dysuria, genital pain, hematuria, nausea or vomiting. Past treatments include doxazosin. The treatment provided significant relief. He has been using treatment for 2 or more years.      Past Medical History  Diagnosis Date    . Hypertension   . Arthritis     oa    Past Surgical History  Procedure Laterality Date  . Back surgery  lower back surgery x 3     L 3 L 4 and L 5 are fused together  . Appendectomy  age 59  . Sebaceous cyst removed Right 15 years ago    middle  finger   . Toe fusion Right     4th toe  . Metal removed from eye  years ago  . 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 Joint replacement      Family History  Problem Relation Age of Onset  . Cancer Mother     Throat  . Hypertension Father   . Hyperlipidemia Father     History   Social History  . Marital Status: Married    Spouse Name: N/A  . Number of Children: N/A  . Years of Education: N/A   Occupational History  . Not on file.   Social History Main Topics  . Smoking status: Never Smoker   . Smokeless tobacco: Never Used  . Alcohol Use: No  . Drug Use: No  . Sexual Activity: Yes    Birth Control/ Protection: Condom   Other Topics Concern  . Not on file   Social History Narrative     Current outpatient prescriptions:  .  aspirin EC 325 MG tablet, Take 1 tablet (325 mg total) by mouth 2 (two) times daily., Disp: 60 tablet, Rfl: 0 .  doxazosin (CARDURA) 8 MG tablet, Take  8 mg by mouth at bedtime., Disp: , Rfl:  .  Fenofibrate 50 MG CAPS, Take 1 capsule by mouth daily., Disp: , Rfl:  .  gemfibrozil (LOPID) 600 MG tablet, Take 600 mg by mouth 2 (two) times daily before a meal., Disp: , Rfl:  .  Linaclotide (LINZESS) 145 MCG CAPS capsule, Take 145 mcg by mouth as needed. , Disp: , Rfl:  .  lisinopril (PRINIVIL,ZESTRIL) 20 MG tablet, Take 20 mg by mouth at bedtime., Disp: , Rfl:  .  methocarbamol (ROBAXIN) 500 MG tablet, Take 1 tablet (500 mg total) by mouth every 8 (eight) hours as needed for muscle spasms., Disp: 50 tablet, Rfl: 2 .  Multiple Vitamin (MULTIVITAMIN WITH MINERALS)  TABS tablet, Take 1 tablet by mouth daily., Disp: , Rfl:  .  OxyCODONE (OXYCONTIN) 40 mg T12A 12 hr tablet, Take 40 mg by mouth every 12 (twelve) hours., Disp: , Rfl:  .  Oxycodone HCl 10 MG TABS, Take 1-1.5 tablets (10-15 mg total) by mouth 3 (three) times daily., Disp: 40 tablet, Rfl: 0  No Known Allergies   Review of Systems  Constitutional: Negative for fever and chills.  Gastrointestinal: Negative for nausea, vomiting and bowel incontinence.  Genitourinary: Positive for nocturia. Negative for bladder incontinence, dysuria, frequency, hematuria, pelvic pain and incomplete emptying.  Musculoskeletal: Positive for back pain. Negative for neck pain.  Neurological: Positive for numbness (numbness in right big toe.). Negative for tingling.      Objective  Filed Vitals:   09/21/14 0907  BP: 130/60  Pulse: 78  Resp: 16  Height: 5' 11.5" (1.816 m)  Weight: 281 lb 14.4 oz (127.869 kg)  SpO2: 98%    Physical Exam  Constitutional: He is well-developed, well-nourished, and in no distress.  HENT:  Head: Normocephalic and atraumatic.  Cardiovascular: Normal rate and regular rhythm.   Pulmonary/Chest: Effort normal and breath sounds normal.  Musculoskeletal:       Lumbar back: He exhibits tenderness, pain and spasm. He exhibits no swelling and no edema.       Back:  Nursing note and vitals reviewed.         Assessment & Plan 1. Chronic low back pain Patient has chronic low back pain, responsive to present opioid therapy. He is compliant with controlled substances agreement. Continue present management. Refills provided and follow-up in one month. - OxyCODONE (OXYCONTIN) 40 mg T12A 12 hr tablet; Take 1 tablet (40 mg total) by mouth every 12 (twelve) hours.  Dispense: 60 tablet; Refill: 0 - oxyCODONE-acetaminophen (PERCOCET) 10-325 MG per tablet; Take 1 tablet by mouth every 8 (eight) hours as needed for pain.  Dispense: 90 tablet; Refill: 0  2. BPH associated with  nocturia  - doxazosin (CARDURA) 8 MG tablet; Take 1 tablet (8 mg total) by mouth at bedtime.  Dispense: 90 tablet; Refill: 3  3. Dyslipidemia Recently itched from fenofibrate or gemfibrozil. We will obtain an updated fasting lipid panel. - gemfibrozil (LOPID) 600 MG tablet; Take 1 tablet (600 mg total) by mouth 2 (two) times daily before a meal.  Dispense: 180 tablet; Refill: 0 - Lipid Profile  There are no diagnoses linked to this encounter.  Jeweline Reif [facility] 09/21/2014 9:17 AM

## 2014-10-22 ENCOUNTER — Encounter: Payer: Self-pay | Admitting: Family Medicine

## 2014-10-22 ENCOUNTER — Ambulatory Visit (INDEPENDENT_AMBULATORY_CARE_PROVIDER_SITE_OTHER): Payer: BLUE CROSS/BLUE SHIELD | Admitting: Family Medicine

## 2014-10-22 VITALS — BP 130/69 | HR 90 | Temp 97.8°F | Resp 18 | Ht 72.0 in | Wt 281.1 lb

## 2014-10-22 DIAGNOSIS — I10 Essential (primary) hypertension: Secondary | ICD-10-CM | POA: Diagnosis not present

## 2014-10-22 DIAGNOSIS — J029 Acute pharyngitis, unspecified: Secondary | ICD-10-CM

## 2014-10-22 DIAGNOSIS — G8929 Other chronic pain: Secondary | ICD-10-CM | POA: Diagnosis not present

## 2014-10-22 DIAGNOSIS — M545 Low back pain: Secondary | ICD-10-CM | POA: Diagnosis not present

## 2014-10-22 MED ORDER — OXYCODONE HCL ER 40 MG PO T12A: 40.0000 mg | EXTENDED_RELEASE_TABLET | Freq: Two times a day (BID) | ORAL | Status: DC

## 2014-10-22 MED ORDER — AZITHROMYCIN 250 MG PO TABS: ORAL_TABLET | ORAL | Status: DC

## 2014-10-22 MED ORDER — LISINOPRIL 20 MG PO TABS: 20.0000 mg | ORAL_TABLET | Freq: Every day | ORAL | Status: DC

## 2014-10-22 MED ORDER — OXYCODONE-ACETAMINOPHEN 10-325 MG PO TABS: 1.0000 | ORAL_TABLET | Freq: Three times a day (TID) | ORAL | Status: DC | PRN

## 2014-10-22 NOTE — Progress Notes (Signed)
Name: Lawrence Thornton.   MRN: [id number]    DOB: 1955-10-20   Date:10/22/2014       Progress Note  Subjective  Chief Complaint  Chief Complaint  Patient presents with  . Follow-up    1 mo./ Cold in throat  . Medication Refill  . Hyperlipidemia    Back Pain This is a chronic problem. The pain is present in the lumbar spine. The pain does not radiate. The pain is at a severity of 3/10. Pertinent negatives include no bladder incontinence, bowel incontinence, headaches, leg pain, numbness or paresis. He has tried analgesics for the symptoms. The treatment provided significant relief.  Sore Throat  This is a new problem. The current episode started in the past 7 days. The problem has been unchanged. There has been no fever. The pain is at a severity of 3/10. The pain is mild. Associated symptoms include coughing. Pertinent negatives include no congestion, ear discharge, ear pain, headaches, neck pain, shortness of breath, swollen glands or trouble swallowing. He has had no exposure to strep or mono.        Past Medical History  Diagnosis Date  . Hypertension   . Arthritis     oa    Past Surgical History  Procedure Laterality Date  . Back surgery  lower back surgery x 3     L 3 L 4 and L 5 are fused together  . Appendectomy  age 84  . Sebaceous cyst removed Right 15 years ago    middle  finger   . Toe fusion Right     4th toe  . Metal removed from eye  years ago  . 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 Joint replacement      Family History  Problem Relation Age of Onset  . Cancer Mother     Throat  . Hypertension Father   . Hyperlipidemia Father     History   Social History  . Marital Status: Married    Spouse Name: N/A  . Number of Children: N/A  . Years of Education: N/A   Occupational History  . Not on file.    Social History Main Topics  . Smoking status: Never Smoker   . Smokeless tobacco: Never Used  . Alcohol Use: No  . Drug Use: No  . Sexual Activity: Yes    Birth Control/ Protection: Condom   Other Topics Concern  . Not on file   Social History Narrative     Current outpatient prescriptions:  .  aspirin EC 325 MG tablet, Take 1 tablet (325 mg total) by mouth 2 (two) times daily., Disp: 60 tablet, Rfl: 0 .  doxazosin (CARDURA) 8 MG tablet, Take 1 tablet (8 mg total) by mouth at bedtime., Disp: 90 tablet, Rfl: 3 .  gemfibrozil (LOPID) 600 MG tablet, Take 1 tablet (600 mg total) by mouth 2 (two) times daily before a meal., Disp: 180 tablet, Rfl: 0 .  Linaclotide (LINZESS) 145 MCG CAPS capsule, Take 145 mcg by mouth as needed. , Disp: , Rfl:  .  lisinopril (PRINIVIL,ZESTRIL) 20 MG tablet, Take 1 tablet (20 mg total) by mouth at bedtime., Disp: 90 tablet, Rfl: 0 .  methocarbamol (ROBAXIN) 500 MG tablet, Take 1 tablet (500 mg total) by mouth every 8 (eight) hours as needed for muscle spasms., Disp: 50 tablet, Rfl:  2 .  Multiple Vitamin (MULTIVITAMIN WITH MINERALS) TABS tablet, Take 1 tablet by mouth daily., Disp: , Rfl:  .  OxyCODONE (OXYCONTIN) 40 mg T12A 12 hr tablet, Take 1 tablet (40 mg total) by mouth every 12 (twelve) hours., Disp: 60 tablet, Rfl: 0 .  oxyCODONE-acetaminophen (PERCOCET) 10-325 MG per tablet, Take 1 tablet by mouth every 8 (eight) hours as needed for pain., Disp: 90 tablet, Rfl: 0 .  azithromycin (ZITHROMAX Z-PAK) 250 MG tablet, 2 tabs po x day 1, then 1 tab po q day x 4 days., Disp: 6 each, Rfl: 0  No Known Allergies   Review of Systems  HENT: Negative for congestion, ear discharge, ear pain and trouble swallowing.   Respiratory: Positive for cough. Negative for shortness of breath.   Gastrointestinal: Negative for bowel incontinence.  Genitourinary: Negative for bladder incontinence.  Musculoskeletal: Positive for back pain. Negative for neck pain.   Neurological: Negative for numbness and headaches.      Objective  Filed Vitals:   10/22/14 0809  BP: 130/69  Pulse: 90  Temp: 97.8 F (36.6 C)  TempSrc: Oral  Resp: 18  Height: 6' (1.829 m)  Weight: 281 lb 1.6 oz (127.506 kg)  SpO2: 95%    Physical Exam  Constitutional: He is oriented to person, place, and time and well-developed, well-nourished, and in no distress.  HENT:  Head: Normocephalic and atraumatic.  Right Ear: Hearing and tympanic membrane normal.  Left Ear: Hearing and tympanic membrane normal.  Nose: Right sinus exhibits no maxillary sinus tenderness and no frontal sinus tenderness. Left sinus exhibits no maxillary sinus tenderness and no frontal sinus tenderness.  Mouth/Throat: Mucous membranes are normal. Posterior oropharyngeal erythema present. No oropharyngeal exudate.  Cardiovascular: Normal rate and regular rhythm.   Pulmonary/Chest: Effort normal and breath sounds normal.  Musculoskeletal:       Right ankle: He exhibits no swelling.       Left ankle: He exhibits no swelling.       Lumbar back: He exhibits pain. He exhibits no tenderness and no spasm.       Back:  Lymphadenopathy:       Right cervical: No superficial cervical and no deep cervical adenopathy present.      Left cervical: No superficial cervical and no deep cervical adenopathy present.  Neurological: He is alert and oriented to person, place, and time.  Skin: Skin is warm and dry.  Nursing note and vitals reviewed.     Assessment & Plan 1. Essential hypertension  - lisinopril (PRINIVIL,ZESTRIL) 20 MG tablet; Take 1 tablet (20 mg total) by mouth at bedtime.  Dispense: 90 tablet; Refill: 0  2. Chronic low back pain Chronic lumbar pain, responsive to opioid therapy. Patient compliant with controlled substances agreement and aware of the adverse reactions and dependence potential of opioids. Refills provided and follow-up in one month. - OxyCODONE (OXYCONTIN) 40 mg T12A 12 hr  tablet; Take 1 tablet (40 mg total) by mouth every 12 (twelve) hours.  Dispense: 60 tablet; Refill: 0 - oxyCODONE-acetaminophen (PERCOCET) 10-325 MG per tablet; Take 1 tablet by mouth every 8 (eight) hours as needed for pain.  Dispense: 90 tablet; Refill: 0  3. Pharyngitis Recommended conservative treatment for sore throat and coughing but provided a prescription for Z-Pak to use start if symptoms not responsive to conservative therapy within 3-4 days. - azithromycin (ZITHROMAX Z-PAK) 250 MG tablet; 2 tabs po x day 1, then 1 tab po q day x 4 days.  Dispense:  6 each; Refill: 0   Lavine Hargrove [facility]ad [facility] 10/22/2014 7:59 PM

## 2014-10-23 LAB — LIPID PANEL
CHOLESTEROL TOTAL: 132 mg/dL (ref 100–199)
Chol/HDL Ratio: 3.5 ratio units (ref 0.0–5.0)
HDL: 38 mg/dL — ABNORMAL LOW (ref >39)
LDL CALC: 73 mg/dL (ref 0–99)
TRIGLYCERIDES: 104 mg/dL (ref 0–149)
VLDL CHOLESTEROL CAL: 21 mg/dL (ref 5–40)

## 2014-11-07 ENCOUNTER — Other Ambulatory Visit: Payer: Self-pay | Admitting: Orthopedic Surgery

## 2014-11-23 ENCOUNTER — Ambulatory Visit (INDEPENDENT_AMBULATORY_CARE_PROVIDER_SITE_OTHER): Payer: BLUE CROSS/BLUE SHIELD | Admitting: Family Medicine

## 2014-11-23 ENCOUNTER — Encounter: Payer: Self-pay | Admitting: Family Medicine

## 2014-11-23 VITALS — BP 140/79 | HR 98 | Temp 98.4°F | Resp 18 | Ht 72.0 in | Wt 283.0 lb

## 2014-11-23 DIAGNOSIS — G8929 Other chronic pain: Secondary | ICD-10-CM

## 2014-11-23 DIAGNOSIS — M545 Low back pain, unspecified: Secondary | ICD-10-CM

## 2014-11-23 MED ORDER — OXYCODONE HCL ER 40 MG PO T12A: 40.0000 mg | EXTENDED_RELEASE_TABLET | Freq: Two times a day (BID) | ORAL | Status: DC

## 2014-11-23 MED ORDER — OXYCODONE-ACETAMINOPHEN 10-325 MG PO TABS: 1.0000 | ORAL_TABLET | Freq: Three times a day (TID) | ORAL | Status: DC | PRN

## 2014-11-23 NOTE — Progress Notes (Signed)
Name: Lawrence Thornton.   MRN: [id number]    DOB: 02-13-1956   Date:11/23/2014       Progress Note  Subjective  Chief Complaint  Chief Complaint  Patient presents with  . Follow-up    4 wk  . Hyperlipidemia  . Medication Refill    oxycontin  / oxycodone w/apap 10/325 tablet    Back Pain This is a chronic problem. The pain is present in the lumbar spine. The pain does not radiate. The pain is at a severity of 3/10. Pertinent negatives include no bladder incontinence, bowel incontinence, leg pain or numbness. He has tried analgesics for the symptoms.      Past Medical History  Diagnosis Date  . Hypertension   . Arthritis     oa    Past Surgical History  Procedure Laterality Date  . Back surgery  lower back surgery x 3     L 3 L 4 and L 5 are fused together  . Appendectomy  age 67  . Sebaceous cyst removed Right 15 years ago    middle  finger   . Toe fusion Right     4th toe  . Metal removed from eye  years ago  . 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 Joint replacement      Family History  Problem Relation Age of Onset  . Cancer Mother     Throat  . Hypertension Father   . Hyperlipidemia Father     Social History   Social History  . Marital Status: Married    Spouse Name: N/A  . Number of Children: N/A  . Years of Education: N/A   Occupational History  . Not on file.   Social History Main Topics  . Smoking status: Never Smoker   . Smokeless tobacco: Never Used  . Alcohol Use: No  . Drug Use: No  . Sexual Activity: Yes    Birth Control/ Protection: Condom   Other Topics Concern  . Not on file   Social History Narrative     Current outpatient prescriptions:  .  aspirin EC 325 MG tablet, Take 1 tablet (325 mg total) by mouth 2 (two) times daily., Disp: 60 tablet, Rfl: 0 .  azithromycin (ZITHROMAX  Z-PAK) 250 MG tablet, 2 tabs po x day 1, then 1 tab po q day x 4 days., Disp: 6 each, Rfl: 0 .  doxazosin (CARDURA) 8 MG tablet, Take 1 tablet (8 mg total) by mouth at bedtime., Disp: 90 tablet, Rfl: 3 .  gemfibrozil (LOPID) 600 MG tablet, Take 1 tablet (600 mg total) by mouth 2 (two) times daily before a meal., Disp: 180 tablet, Rfl: 0 .  Linaclotide (LINZESS) 145 MCG CAPS capsule, Take 145 mcg by mouth as needed. , Disp: , Rfl:  .  lisinopril (PRINIVIL,ZESTRIL) 20 MG tablet, Take 1 tablet (20 mg total) by mouth at bedtime., Disp: 90 tablet, Rfl: 0 .  methocarbamol (ROBAXIN) 500 MG tablet, Take 1 tablet (500 mg total) by mouth every 8 (eight) hours as needed for muscle spasms., Disp: 50 tablet, Rfl: 2 .  Multiple Vitamin (MULTIVITAMIN WITH MINERALS) TABS tablet, Take 1 tablet by mouth daily., Disp: , Rfl:  .  OxyCODONE (OXYCONTIN) 40 mg T12A 12 hr tablet, Take 1 tablet (40 mg total) by mouth every 12 (twelve) hours., Disp: 60 tablet, Rfl:  0 .  oxyCODONE-acetaminophen (PERCOCET) 10-325 MG per tablet, Take 1 tablet by mouth every 8 (eight) hours as needed for pain., Disp: 90 tablet, Rfl: 0  No Known Allergies   Review of Systems  Gastrointestinal: Negative for bowel incontinence.  Genitourinary: Negative for bladder incontinence.  Musculoskeletal: Positive for back pain and joint pain.  Neurological: Negative for numbness.      Objective  Filed Vitals:   11/23/14 1017  BP: 140/79  Pulse: 98  Temp: 98.4 F (36.9 C)  TempSrc: Oral  Resp: 18  Height: 6' (1.829 m)  Weight: 283 lb (128.368 kg)  SpO2: 95%    Physical Exam  Constitutional: He is oriented to person, place, and time and well-developed, well-nourished, and in no distress.  Cardiovascular: Normal rate and regular rhythm.   Pulmonary/Chest: Effort normal and breath sounds normal.  Musculoskeletal:       Back:  Neurological: He is alert and oriented to person, place, and time.  Nursing note and vitals  reviewed.   Assessment & Plan  1. Chronic low back pain Chronic low back pain stable on opioid therapy. Patient compliant with controlled substances agreement and taking the medication as directed.no side effects reported. Patient aware of the dependence potential of opioids. Refills provided follow-up in one month.  - OxyCODONE (OXYCONTIN) 40 mg T12A 12 hr tablet; Take 1 tablet (40 mg total) by mouth every 12 (twelve) hours.  Dispense: 60 tablet; Refill: 0 - oxyCODONE-acetaminophen (PERCOCET) 10-325 MG per tablet; Take 1 tablet by mouth every 8 (eight) hours as needed for pain.  Dispense: 90 tablet; Refill: 0   [name] [facility] 11/23/2014 10:34 AM

## 2014-11-26 ENCOUNTER — Ambulatory Visit: Payer: BLUE CROSS/BLUE SHIELD | Admitting: Family Medicine

## 2014-12-07 NOTE — Patient Instructions (Addendum)
Lawrence Thornton.  12/07/2014   Your procedure is scheduled on: December 21, 2014  Report to [facility]  Entrance take [facility]  elevators to 3rd floor to  Short Stay Center at 11:00  AM.  Call this number if you have problems the morning of surgery [phone]   Remember: ONLY 1 PERSON MAY GO WITH YOU TO SHORT STAY TO GET  READY MORNING OF YOUR SURGERY.  Do not eat food or drink liquids :After Midnight.     Take these medicines the morning of surgery with A SIP OF WATER: Oxycodone if needed                               You may not have any metal on your body including hair pins and              piercings  Do not wear jewelry,  lotions, powders or perfumes, deodorant                       Men may shave face and neck.   Do not bring valuables to the hospital. [facility] IS NOT             RESPONSIBLE   FOR VALUABLES.  Contacts, dentures or bridgework may not be worn into surgery.  Leave suitcase in the car. After surgery it may be brought to your room.      Special Instructions: coughing and deep breathing exercises, leg exercises              Please read over the following fact sheets you were given: _____________________________________________________________________             [facility] - Preparing for Surgery Before surgery, you can play an important role.  Because skin is not sterile, your skin needs to be as free of germs as possible.  You can reduce the number of germs on your skin by washing with CHG (chlorahexidine gluconate) soap before surgery.  CHG is an antiseptic cleaner which kills germs and bonds with the skin to continue killing germs even after washing. Please DO NOT use if you have an allergy to CHG or antibacterial soaps.  If your skin becomes reddened/irritated stop using the CHG and inform your nurse when you arrive at Short Stay. Do not shave (including legs and underarms) for at least 48 hours prior to the first CHG  shower.  You may shave your face/neck. Please follow these instructions carefully:  1.  Shower with CHG Soap the night before surgery and the  morning of Surgery.  2.  If you choose to wash your hair, wash your hair first as usual with your  normal  shampoo.  3.  After you shampoo, rinse your hair and body thoroughly to remove the  shampoo.                           4.  Use CHG as you would any other liquid soap.  You can apply chg directly  to the skin and wash                       Gently with a scrungie or clean washcloth.  5.  Apply the CHG Soap to your body ONLY FROM  THE NECK DOWN.   Do not use on face/ open                           Wound or open sores. Avoid contact with eyes, ears mouth and genitals (private parts).                       Wash face,  Genitals (private parts) with your normal soap.             6.  Wash thoroughly, paying special attention to the area where your surgery  will be performed.  7.  Thoroughly rinse your body with warm water from the neck down.  8.  DO NOT shower/wash with your normal soap after using and rinsing off  the CHG Soap.                9.  Pat yourself dry with a clean towel.            10.  Wear clean pajamas.            11.  Place clean sheets on your bed the night of your first shower and do not  sleep with pets. Day of Surgery : Do not apply any lotions/deodorants the morning of surgery.  Please wear clean clothes to the hospital/surgery center.  FAILURE TO FOLLOW THESE INSTRUCTIONS MAY RESULT IN THE CANCELLATION OF YOUR SURGERY PATIENT SIGNATURE_________________________________  NURSE SIGNATURE__________________________________  ________________________________________________________________________   Lawrence Thornton  An incentive spirometer is a tool that can help keep your lungs clear and active. This tool measures how well you are filling your lungs with each breath. Taking long deep breaths may help reverse or decrease the chance  of developing breathing (pulmonary) problems (especially infection) following:  A long period of time when you are unable to move or be active. BEFORE THE PROCEDURE   If the spirometer includes an indicator to show your best effort, your nurse or respiratory therapist will set it to a desired goal.  If possible, sit up straight or lean slightly forward. Try not to slouch.  Hold the incentive spirometer in an upright position. INSTRUCTIONS FOR USE  1. Sit on the edge of your bed if possible, or sit up as far as you can in bed or on a chair. 2. Hold the incentive spirometer in an upright position. 3. Breathe out normally. 4. Place the mouthpiece in your mouth and seal your lips tightly around it. 5. Breathe in slowly and as deeply as possible, raising the piston or the ball toward the top of the column. 6. Hold your breath for 3-5 seconds or for as long as possible. Allow the piston or ball to fall to the bottom of the column. 7. Remove the mouthpiece from your mouth and breathe out normally. 8. Rest for a few seconds and repeat Steps 1 through 7 at least 10 times every 1-2 hours when you are awake. Take your time and take a few normal breaths between deep breaths. 9. The spirometer may include an indicator to show your best effort. Use the indicator as a goal to work toward during each repetition. 10. After each set of 10 deep breaths, practice coughing to be sure your lungs are clear. If you have an incision (the cut made at the time of surgery), support your incision when coughing by placing a pillow or rolled up towels firmly against it. Once you are  able to get out of bed, walk around indoors and cough well. You may stop using the incentive spirometer when instructed by your caregiver.  RISKS AND COMPLICATIONS  Take your time so you do not get dizzy or light-headed.  If you are in pain, you may need to take or ask for pain medication before doing incentive spirometry. It is harder to  take a deep breath if you are having pain. AFTER USE  Rest and breathe slowly and easily.  It can be helpful to keep track of a log of your progress. Your caregiver can provide you with a simple table to help with this. If you are using the spirometer at home, follow these instructions: SEEK MEDICAL CARE IF:   You are having difficultly using the spirometer.  You have trouble using the spirometer as often as instructed.  Your pain medication is not giving enough relief while using the spirometer.  You develop fever of 100.5 F (38.1 C) or higher. SEEK IMMEDIATE MEDICAL CARE IF:   You cough up bloody sputum that had not been present before.  You develop fever of 102 F (38.9 C) or greater.  You develop worsening pain at or near the incision site. MAKE SURE YOU:   Understand these instructions.  Will watch your condition.  Will get help right away if you are not doing well or get worse. Document Released: 08/03/2006 Document Revised: 06/15/2011 Document Reviewed: 10/04/2006 [facility] Patient Information 2014 [place], [place].   ________________________________________________________________________

## 2014-12-13 ENCOUNTER — Encounter (HOSPITAL_COMMUNITY)
Admission: RE | Admit: 2014-12-13 | Discharge: 2014-12-13 | Disposition: A | Discharge status: Home / Self Care | Payer: BLUE CROSS/BLUE SHIELD | Source: Ambulatory Visit | Attending: Ophthalmology | Admitting: Ophthalmology

## 2014-12-13 ENCOUNTER — Encounter (HOSPITAL_COMMUNITY): Payer: Self-pay

## 2014-12-13 DIAGNOSIS — M1711 Unilateral primary osteoarthritis, right knee: Secondary | ICD-10-CM | POA: Insufficient documentation

## 2014-12-13 DIAGNOSIS — Z01812 Encounter for preprocedural laboratory examination: Secondary | ICD-10-CM | POA: Diagnosis not present

## 2014-12-13 LAB — BASIC METABOLIC PANEL
Anion gap: 9 (ref 5–15)
BUN: 11 mg/dL (ref 6–20)
CO2: 25 mmol/L (ref 22–32)
CREATININE: 0.71 mg/dL (ref 0.61–1.24)
Calcium: 9.7 mg/dL (ref 8.9–10.3)
Chloride: 103 mmol/L (ref 101–111)
GFR calc Af Amer: >60 mL/min (ref >60)
GLUCOSE: 105 mg/dL — AB (ref 65–99)
POTASSIUM: 4.2 mmol/L (ref 3.5–5.1)
SODIUM: 137 mmol/L (ref 135–145)

## 2014-12-13 LAB — SURGICAL PCR SCREEN
MRSA, PCR: NEGATIVE
STAPHYLOCOCCUS AUREUS: NEGATIVE

## 2014-12-13 LAB — CBC
HEMATOCRIT: 43.3 % (ref 39.0–52.0)
Hemoglobin: 14.2 g/dL (ref 13.0–17.0)
MCH: 29.2 pg (ref 26.0–34.0)
MCHC: 32.8 g/dL (ref 30.0–36.0)
MCV: 89.1 fL (ref 78.0–100.0)
PLATELETS: 249 10*3/uL (ref 150–400)
RBC: 4.86 MIL/uL (ref 4.22–5.81)
RDW: 13.7 % (ref 11.5–15.5)
WBC: 5.4 10*3/uL (ref 4.0–10.5)

## 2014-12-13 LAB — URINALYSIS, ROUTINE W REFLEX MICROSCOPIC
Bilirubin Urine: NEGATIVE
Glucose, UA: NEGATIVE mg/dL
HGB URINE DIPSTICK: NEGATIVE
Ketones, ur: NEGATIVE mg/dL
LEUKOCYTES UA: NEGATIVE
Nitrite: NEGATIVE
PROTEIN: NEGATIVE mg/dL
SPECIFIC GRAVITY, URINE: 1.026 (ref 1.005–1.030)
UROBILINOGEN UA: 0.2 mg/dL (ref 0.0–1.0)
pH: 6 (ref 5.0–8.0)

## 2014-12-13 LAB — APTT: APTT: 34 s (ref 24–37)

## 2014-12-13 LAB — PROTIME-INR
INR: 1.07 (ref 0.00–1.49)
Prothrombin Time: 14.1 seconds (ref 11.6–15.2)

## 2014-12-13 NOTE — Progress Notes (Signed)
08-02-14 - EKG - EPIC 11-23-14 - LOV - Dr. Sherryll Burger (int.med.) - EPIC Surgical Clearance - Dr. Sherryll Burger - In Chart 06-14-14 - EKG - In Chart 06-14-14 - PT Lab - in chart 3=-10-16 - CBC w/diff - in chart 06-2014 - CXR - in chart

## 2014-12-18 ENCOUNTER — Telehealth: Payer: Self-pay | Admitting: Family Medicine

## 2014-12-18 DIAGNOSIS — M545 Low back pain: Principal | ICD-10-CM

## 2014-12-18 DIAGNOSIS — G8929 Other chronic pain: Secondary | ICD-10-CM

## 2014-12-18 MED ORDER — OXYCODONE-ACETAMINOPHEN 10-325 MG PO TABS: 1.0000 | ORAL_TABLET | Freq: Three times a day (TID) | ORAL | Status: DC | PRN

## 2014-12-18 MED ORDER — OXYCODONE HCL ER 40 MG PO T12A: 40.0000 mg | EXTENDED_RELEASE_TABLET | Freq: Two times a day (BID) | ORAL | Status: DC

## 2014-12-18 NOTE — Telephone Encounter (Signed)
Prescription is ready for pick up at front desk patient has been notified to bring photo ID

## 2014-12-19 ENCOUNTER — Ambulatory Visit: Payer: BLUE CROSS/BLUE SHIELD | Admitting: Family Medicine

## 2014-12-19 NOTE — H&P (Signed)
TOTAL KNEE ADMISSION H&P  Patient is being admitted for right total knee arthroplasty.  Subjective:  Chief Complaint:right knee pain.  HPI: Lawrence Thornton., 59 y.o. male, has a history of pain and functional disability in the right knee due to arthritis and has failed non-surgical conservative treatments for greater than 12 weeks to includeNSAID's and/or analgesics, corticosteriod injections, viscosupplementation injections, flexibility and strengthening excercises, supervised PT with diminished ADL's post treatment, weight reduction as appropriate and activity modification.  Onset of symptoms was gradual, starting 4 years ago with gradually worsening course since that time. The patient noted no procedures on the right knee(s).  Patient currently rates pain in the right knee(s) at 8 out of 10 with activity. Patient has worsening of pain with activity and weight bearing, pain that interferes with activities of daily living, pain with passive range of motion and joint swelling.  Patient has evidence of periarticular osteophytes, joint subluxation and joint space narrowing by imaging studies. This patient has had osteoarthrtis. There is no active infection.  Patient Active Problem List   Diagnosis Date Noted  . Pharyngitis 10/22/2014  . Chronic low back pain 09/21/2014  . BPH associated with nocturia 09/21/2014  . Dyslipidemia 09/21/2014  . Continuous opioid dependence 09/21/2014  . Chronic LBP 09/21/2014  . Chronic constipation 09/21/2014  . Benign fibroma of prostate 09/21/2014  . DDD (degenerative disc disease), lumbar 09/21/2014  . Failure of erection 09/21/2014  . BP (high blood pressure) 09/21/2014  . Long term current use of opiate analgesic 09/21/2014  . Primary osteoarthritis of left knee 08/10/2014  . Status post total left knee replacement using cement 08/10/2014  . S/P knee surgery 08/10/2014   Past Medical History  Diagnosis Date  . Hypertension   . Arthritis     oa     Past Surgical History  Procedure Laterality Date  . Back surgery  lower back surgery x 3     L 3 L 4 and L 5 are fused together  . Appendectomy  age 5  . Sebaceous cyst removed Right 15 years ago    middle  finger   . Toe fusion Right     4th toe  . Metal removed from eye  years ago  . 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 Joint replacement      No prescriptions prior to admission   No Known Allergies  Social History  Substance Use Topics  . Smoking status: Former Smoker    Quit date: 04/07/1999  . Smokeless tobacco: Never Used  . Alcohol Use: No    Family History  Problem Relation Age of Onset  . Cancer Mother     Throat  . Hypertension Father   . Hyperlipidemia Father      Review of Systems  Constitutional: Negative.   HENT: Negative.   Eyes: Negative.   Respiratory: Negative.   Cardiovascular: Negative.   Gastrointestinal: Negative.   Genitourinary: Negative.   Musculoskeletal: Positive for joint pain.  Skin: Negative.   Neurological: Negative.   Endo/Heme/Allergies: Negative.   Psychiatric/Behavioral: Negative.     Objective:  Physical Exam  Constitutional: He is oriented to person, place, and time. He appears well-developed.  HENT:  Head: Normocephalic.  Eyes: EOM are normal.  Neck: Normal range of motion.  Cardiovascular: Normal rate and intact distal pulses.   Respiratory: Effort normal.  GI: Soft.  Genitourinary:  deferred  Musculoskeletal:  Right knee pain with ROM.  Neurological: He is alert and oriented to person, place, and time.  Skin: Skin is warm and dry.  Psychiatric: His behavior is normal.    Vital signs in last 24 hours:    Labs:   Estimated body mass index is 38.12 kg/(m^2) as calculated from the following:   Height as of 10/22/14: 6' (1.829 m).   Weight as of 10/22/14: 127.506 kg  (281 lb 1.6 oz).   Imaging Review Plain radiographs demonstrate moderate degenerative joint disease of the right knee(s). The overall alignment ismild varus. The bone quality appears to be good for age and reported activity level.  Assessment/Plan:  End stage arthritis, right knee   The patient history, physical examination, clinical judgment of the provider and imaging studies are consistent with end stage degenerative joint disease of the right knee(s) and total knee arthroplasty is deemed medically necessary. The treatment options including medical management, injection therapy arthroscopy and arthroplasty were discussed at length. The risks and benefits of total knee arthroplasty were presented and reviewed. The risks due to aseptic loosening, infection, stiffness, patella tracking problems, thromboembolic complications and other imponderables were discussed. The patient acknowledged the explanation, agreed to proceed with the plan and consent was signed. Patient is being admitted for inpatient treatment for surgery, pain control, PT, OT, prophylactic antibiotics, VTE prophylaxis, progressive ambulation and ADL's and discharge planning. The patient is planning to be discharged home with home health services.  Contraindications and adverse affects of Tranexamic acid discussed in detail. Patient denies any of these at this time and understands the risks and benefits.

## 2014-12-20 MED ORDER — DEXTROSE 5 % IV SOLN
3.0000 g | INTRAVENOUS | Status: AC
  Administered 2014-12-21: 3 g via INTRAVENOUS
  Filled 2014-12-20: qty 3000

## 2014-12-21 ENCOUNTER — Inpatient Hospital Stay (HOSPITAL_COMMUNITY): Payer: BLUE CROSS/BLUE SHIELD | Admitting: Certified Registered"

## 2014-12-21 ENCOUNTER — Inpatient Hospital Stay (HOSPITAL_COMMUNITY)
Admission: RE | Admit: 2014-12-21 | Discharge: 2014-12-23 | DRG: 470 | Disposition: A | Discharge status: Home Health | Payer: BLUE CROSS/BLUE SHIELD | Source: Ambulatory Visit | Attending: Specialist | Admitting: Specialist

## 2014-12-21 ENCOUNTER — Encounter (HOSPITAL_COMMUNITY): Payer: Self-pay | Admitting: *Deleted

## 2014-12-21 ENCOUNTER — Encounter (HOSPITAL_COMMUNITY)
Admission: RE | Disposition: A | Discharge status: Home Health | Payer: Self-pay | Source: Ambulatory Visit | Attending: Specialist

## 2014-12-21 DIAGNOSIS — Z01812 Encounter for preprocedural laboratory examination: Secondary | ICD-10-CM

## 2014-12-21 DIAGNOSIS — Z87891 Personal history of nicotine dependence: Secondary | ICD-10-CM

## 2014-12-21 DIAGNOSIS — M545 Low back pain: Secondary | ICD-10-CM | POA: Diagnosis present

## 2014-12-21 DIAGNOSIS — G8929 Other chronic pain: Secondary | ICD-10-CM | POA: Diagnosis present

## 2014-12-21 DIAGNOSIS — M25561 Pain in right knee: Secondary | ICD-10-CM | POA: Diagnosis present

## 2014-12-21 DIAGNOSIS — M1711 Unilateral primary osteoarthritis, right knee: Secondary | ICD-10-CM | POA: Diagnosis present

## 2014-12-21 DIAGNOSIS — Z96659 Presence of unspecified artificial knee joint: Secondary | ICD-10-CM

## 2014-12-21 DIAGNOSIS — Z79891 Long term (current) use of opiate analgesic: Secondary | ICD-10-CM

## 2014-12-21 DIAGNOSIS — E785 Hyperlipidemia, unspecified: Secondary | ICD-10-CM | POA: Diagnosis present

## 2014-12-21 DIAGNOSIS — Z96652 Presence of left artificial knee joint: Secondary | ICD-10-CM | POA: Diagnosis present

## 2014-12-21 DIAGNOSIS — I10 Essential (primary) hypertension: Secondary | ICD-10-CM | POA: Diagnosis present

## 2014-12-21 HISTORY — PX: TOTAL KNEE ARTHROPLASTY: SHX125

## 2014-12-21 LAB — TYPE AND SCREEN
ABO/RH(D): B POS
Antibody Screen: NEGATIVE

## 2014-12-21 SURGERY — ARTHROPLASTY, KNEE, TOTAL
Anesthesia: Spinal | Site: Knee | Laterality: Right

## 2014-12-21 MED ORDER — KETAMINE HCL 10 MG/ML IJ SOLN
INTRAMUSCULAR | Status: DC | PRN
  Administered 2014-12-21 (×2): 20 mg via INTRAVENOUS

## 2014-12-21 MED ORDER — MENTHOL 3 MG MT LOZG: 1.0000 | LOZENGE | OROMUCOSAL | Status: DC | PRN

## 2014-12-21 MED ORDER — ACETAMINOPHEN 10 MG/ML IV SOLN
INTRAVENOUS | Status: AC
Start: 2014-12-21 — End: 2014-12-22
  Filled 2014-12-21: qty 100

## 2014-12-21 MED ORDER — PROPOFOL 10 MG/ML IV BOLUS
INTRAVENOUS | Status: AC
  Filled 2014-12-21: qty 20

## 2014-12-21 MED ORDER — DIPHENHYDRAMINE HCL 12.5 MG/5ML PO ELIX: 12.5000 mg | ORAL_SOLUTION | ORAL | Status: DC | PRN

## 2014-12-21 MED ORDER — KETOROLAC TROMETHAMINE 30 MG/ML IJ SOLN
INTRAMUSCULAR | Status: AC
  Filled 2014-12-21: qty 1

## 2014-12-21 MED ORDER — POLYETHYLENE GLYCOL 3350 17 G PO PACK: 17.0000 g | PACK | Freq: Every day | ORAL | Status: DC | PRN

## 2014-12-21 MED ORDER — ASPIRIN EC 325 MG PO TBEC: 325.0000 mg | DELAYED_RELEASE_TABLET | Freq: Two times a day (BID) | ORAL | Status: DC

## 2014-12-21 MED ORDER — GEMFIBROZIL 600 MG PO TABS
600.0000 mg | ORAL_TABLET | Freq: Two times a day (BID) | ORAL | Status: DC
  Administered 2014-12-21 – 2014-12-23 (×4): 600 mg via ORAL
  Filled 2014-12-21 (×6): qty 1

## 2014-12-21 MED ORDER — OXYCODONE-ACETAMINOPHEN 10-325 MG PO TABS: 1.0000 | ORAL_TABLET | Freq: Three times a day (TID) | ORAL | Status: DC | PRN

## 2014-12-21 MED ORDER — TRANEXAMIC ACID 1000 MG/10ML IV SOLN
1000.0000 mg | INTRAVENOUS | Status: AC
  Administered 2014-12-21: 1000 mg via INTRAVENOUS
  Filled 2014-12-21: qty 10

## 2014-12-21 MED ORDER — DEXAMETHASONE SODIUM PHOSPHATE 10 MG/ML IJ SOLN
INTRAMUSCULAR | Status: AC
  Filled 2014-12-21: qty 1

## 2014-12-21 MED ORDER — LIDOCAINE HCL (CARDIAC) 20 MG/ML IV SOLN
INTRAVENOUS | Status: DC | PRN
  Administered 2014-12-21: 50 mg via INTRAVENOUS

## 2014-12-21 MED ORDER — PROPOFOL 10 MG/ML IV BOLUS
INTRAVENOUS | Status: DC | PRN
  Administered 2014-12-21: 20 mg via INTRAVENOUS
  Administered 2014-12-21: 40 mg via INTRAVENOUS

## 2014-12-21 MED ORDER — HYDROMORPHONE HCL 1 MG/ML IJ SOLN
0.2500 mg | INTRAMUSCULAR | Status: DC | PRN
  Administered 2014-12-21 (×2): 0.5 mg via INTRAVENOUS

## 2014-12-21 MED ORDER — BUPIVACAINE-EPINEPHRINE (PF) 0.25% -1:200000 IJ SOLN
INTRAMUSCULAR | Status: AC
  Filled 2014-12-21: qty 30

## 2014-12-21 MED ORDER — FENTANYL CITRATE (PF) 100 MCG/2ML IJ SOLN
INTRAMUSCULAR | Status: AC
  Filled 2014-12-21: qty 4

## 2014-12-21 MED ORDER — OXYCODONE HCL 5 MG PO TABS
5.0000 mg | ORAL_TABLET | ORAL | Status: DC | PRN
  Administered 2014-12-21 – 2014-12-22 (×2): 10 mg via ORAL
  Administered 2014-12-22: 5 mg via ORAL
  Administered 2014-12-23 (×4): 10 mg via ORAL
  Filled 2014-12-21 (×7): qty 2

## 2014-12-21 MED ORDER — BISACODYL 10 MG RE SUPP: 10.0000 mg | Freq: Every day | RECTAL | Status: DC | PRN

## 2014-12-21 MED ORDER — METOCLOPRAMIDE HCL 10 MG PO TABS: 5.0000 mg | ORAL_TABLET | Freq: Three times a day (TID) | ORAL | Status: DC | PRN

## 2014-12-21 MED ORDER — SODIUM CHLORIDE 0.9 % IR SOLN
Status: DC | PRN
  Administered 2014-12-21: 1000 mL

## 2014-12-21 MED ORDER — HYDROMORPHONE HCL 1 MG/ML IJ SOLN
1.0000 mg | INTRAMUSCULAR | Status: DC | PRN
  Administered 2014-12-21 – 2014-12-22 (×5): 1 mg via INTRAVENOUS
  Filled 2014-12-21 (×5): qty 1

## 2014-12-21 MED ORDER — LACTATED RINGERS IV SOLN
INTRAVENOUS | Status: DC
  Administered 2014-12-21: 15:00:00 via INTRAVENOUS
  Administered 2014-12-21: 1000 mL via INTRAVENOUS
  Administered 2014-12-21: 16:00:00 via INTRAVENOUS

## 2014-12-21 MED ORDER — LISINOPRIL 20 MG PO TABS
20.0000 mg | ORAL_TABLET | Freq: Every day | ORAL | Status: DC
  Administered 2014-12-21 – 2014-12-22 (×2): 20 mg via ORAL
  Filled 2014-12-21 (×3): qty 1

## 2014-12-21 MED ORDER — ACETAMINOPHEN 325 MG PO TABS: 650.0000 mg | ORAL_TABLET | Freq: Four times a day (QID) | ORAL | Status: DC | PRN

## 2014-12-21 MED ORDER — BUPIVACAINE-EPINEPHRINE 0.25% -1:200000 IJ SOLN
INTRAMUSCULAR | Status: DC | PRN
Start: 2014-12-21 — End: 2014-12-21
  Administered 2014-12-21: 30 mL

## 2014-12-21 MED ORDER — OXYCODONE HCL 5 MG PO TABS: 5.0000 mg | ORAL_TABLET | ORAL | Status: DC | PRN

## 2014-12-21 MED ORDER — POTASSIUM CHLORIDE IN NACL 20-0.9 MEQ/L-% IV SOLN
INTRAVENOUS | Status: DC
  Administered 2014-12-21: 75 mL/h via INTRAVENOUS
  Filled 2014-12-21 (×4): qty 1000

## 2014-12-21 MED ORDER — SODIUM CHLORIDE 0.9 % IJ SOLN
INTRAMUSCULAR | Status: AC
  Filled 2014-12-21: qty 50

## 2014-12-21 MED ORDER — DEXAMETHASONE SODIUM PHOSPHATE 10 MG/ML IJ SOLN
10.0000 mg | Freq: Once | INTRAMUSCULAR | Status: AC
  Administered 2014-12-22: 10 mg via INTRAVENOUS
  Filled 2014-12-21: qty 1

## 2014-12-21 MED ORDER — POVIDONE-IODINE 7.5 % EX SOLN: Freq: Once | CUTANEOUS | Status: DC

## 2014-12-21 MED ORDER — PROPOFOL INFUSION 10 MG/ML OPTIME
INTRAVENOUS | Status: DC | PRN
  Administered 2014-12-21: 150 ug/kg/min via INTRAVENOUS

## 2014-12-21 MED ORDER — ALUM & MAG HYDROXIDE-SIMETH 200-200-20 MG/5ML PO SUSP: 30.0000 mL | ORAL | Status: DC | PRN

## 2014-12-21 MED ORDER — DEXAMETHASONE SODIUM PHOSPHATE 10 MG/ML IJ SOLN
10.0000 mg | Freq: Once | INTRAMUSCULAR | Status: AC
  Administered 2014-12-21: 10 mg via INTRAVENOUS

## 2014-12-21 MED ORDER — METOCLOPRAMIDE HCL 5 MG/ML IJ SOLN: 5.0000 mg | Freq: Three times a day (TID) | INTRAMUSCULAR | Status: DC | PRN

## 2014-12-21 MED ORDER — ACETAMINOPHEN 10 MG/ML IV SOLN
INTRAVENOUS | Status: DC | PRN
  Administered 2014-12-21: 1000 mg via INTRAVENOUS

## 2014-12-21 MED ORDER — OXYCODONE HCL 5 MG PO TABS
5.0000 mg | ORAL_TABLET | Freq: Three times a day (TID) | ORAL | Status: DC | PRN
  Administered 2014-12-22 (×2): 5 mg via ORAL
  Filled 2014-12-21 (×2): qty 1

## 2014-12-21 MED ORDER — MIDAZOLAM HCL 2 MG/2ML IJ SOLN
INTRAMUSCULAR | Status: AC
  Filled 2014-12-21: qty 4

## 2014-12-21 MED ORDER — GLYCOPYRROLATE 0.2 MG/ML IJ SOLN
INTRAMUSCULAR | Status: DC | PRN
  Administered 2014-12-21: 0.2 mg via INTRAVENOUS

## 2014-12-21 MED ORDER — OXYCODONE-ACETAMINOPHEN 5-325 MG PO TABS
1.0000 | ORAL_TABLET | Freq: Three times a day (TID) | ORAL | Status: DC | PRN
  Administered 2014-12-22 (×2): 1 via ORAL
  Filled 2014-12-21 (×2): qty 1

## 2014-12-21 MED ORDER — MAGNESIUM CITRATE PO SOLN: 1.0000 | Freq: Once | ORAL | Status: DC | PRN

## 2014-12-21 MED ORDER — FENTANYL CITRATE (PF) 100 MCG/2ML IJ SOLN
INTRAMUSCULAR | Status: DC | PRN
  Administered 2014-12-21 (×3): 50 ug via INTRAVENOUS
  Administered 2014-12-21 (×2): 100 ug via INTRAVENOUS
  Administered 2014-12-21: 50 ug via INTRAVENOUS
  Administered 2014-12-21 (×2): 100 ug via INTRAVENOUS
  Administered 2014-12-21: 50 ug via INTRAVENOUS
  Administered 2014-12-21: 100 ug via INTRAVENOUS
  Administered 2014-12-21: 50 ug via INTRAVENOUS

## 2014-12-21 MED ORDER — DOXAZOSIN MESYLATE 8 MG PO TABS
8.0000 mg | ORAL_TABLET | Freq: Every day | ORAL | Status: DC
  Administered 2014-12-21 – 2014-12-22 (×2): 8 mg via ORAL
  Filled 2014-12-21 (×3): qty 1

## 2014-12-21 MED ORDER — CEFAZOLIN SODIUM-DEXTROSE 2-3 GM-% IV SOLR
2.0000 g | Freq: Four times a day (QID) | INTRAVENOUS | Status: AC
  Administered 2014-12-21 – 2014-12-22 (×2): 2 g via INTRAVENOUS
  Filled 2014-12-21 (×2): qty 50

## 2014-12-21 MED ORDER — METHOCARBAMOL 1000 MG/10ML IJ SOLN
500.0000 mg | Freq: Four times a day (QID) | INTRAVENOUS | Status: DC | PRN
  Administered 2014-12-21: 500 mg via INTRAVENOUS
  Filled 2014-12-21 (×2): qty 5

## 2014-12-21 MED ORDER — SODIUM CHLORIDE 0.9 % IJ SOLN
INTRAMUSCULAR | Status: DC | PRN
  Administered 2014-12-21: 30 mL

## 2014-12-21 MED ORDER — HYDROMORPHONE HCL 1 MG/ML IJ SOLN
INTRAMUSCULAR | Status: AC
  Administered 2014-12-21: 0.5 mg via INTRAVENOUS
  Filled 2014-12-21: qty 1

## 2014-12-21 MED ORDER — METHOCARBAMOL 500 MG PO TABS: 500.0000 mg | ORAL_TABLET | Freq: Four times a day (QID) | ORAL | Status: DC | PRN

## 2014-12-21 MED ORDER — KETOROLAC TROMETHAMINE 30 MG/ML IJ SOLN
INTRAMUSCULAR | Status: DC | PRN
Start: 2014-12-21 — End: 2014-12-21
  Administered 2014-12-21: 30 mg

## 2014-12-21 MED ORDER — FENTANYL CITRATE (PF) 250 MCG/5ML IJ SOLN
INTRAMUSCULAR | Status: AC
  Filled 2014-12-21: qty 25

## 2014-12-21 MED ORDER — METHOCARBAMOL 500 MG PO TABS
500.0000 mg | ORAL_TABLET | Freq: Four times a day (QID) | ORAL | Status: DC | PRN
  Administered 2014-12-22 – 2014-12-23 (×4): 500 mg via ORAL
  Filled 2014-12-21 (×4): qty 1

## 2014-12-21 MED ORDER — FERROUS SULFATE 325 (65 FE) MG PO TABS
325.0000 mg | ORAL_TABLET | Freq: Three times a day (TID) | ORAL | Status: DC
  Administered 2014-12-22 – 2014-12-23 (×4): 325 mg via ORAL
  Filled 2014-12-21 (×7): qty 1

## 2014-12-21 MED ORDER — ZOLPIDEM TARTRATE 5 MG PO TABS: 5.0000 mg | ORAL_TABLET | Freq: Every evening | ORAL | Status: DC | PRN

## 2014-12-21 MED ORDER — MIDAZOLAM HCL 5 MG/5ML IJ SOLN
INTRAMUSCULAR | Status: DC | PRN
  Administered 2014-12-21 (×2): 2 mg via INTRAVENOUS

## 2014-12-21 MED ORDER — LINACLOTIDE 145 MCG PO CAPS
145.0000 ug | ORAL_CAPSULE | ORAL | Status: DC | PRN
  Filled 2014-12-21: qty 1

## 2014-12-21 MED ORDER — OXYCODONE HCL ER 10 MG PO T12A
40.0000 mg | EXTENDED_RELEASE_TABLET | Freq: Two times a day (BID) | ORAL | Status: DC
  Administered 2014-12-21 – 2014-12-23 (×4): 40 mg via ORAL
  Filled 2014-12-21 (×4): qty 4

## 2014-12-21 MED ORDER — ONDANSETRON HCL 4 MG PO TABS: 4.0000 mg | ORAL_TABLET | Freq: Four times a day (QID) | ORAL | Status: DC | PRN

## 2014-12-21 MED ORDER — PHENOL 1.4 % MT LIQD: 1.0000 | OROMUCOSAL | Status: DC | PRN

## 2014-12-21 MED ORDER — DOCUSATE SODIUM 100 MG PO CAPS
100.0000 mg | ORAL_CAPSULE | Freq: Two times a day (BID) | ORAL | Status: DC
  Administered 2014-12-21 – 2014-12-23 (×4): 100 mg via ORAL

## 2014-12-21 MED ORDER — KETAMINE HCL 10 MG/ML IJ SOLN
INTRAMUSCULAR | Status: AC
  Filled 2014-12-21: qty 1

## 2014-12-21 MED ORDER — ONDANSETRON HCL 4 MG/2ML IJ SOLN: 4.0000 mg | Freq: Four times a day (QID) | INTRAMUSCULAR | Status: DC | PRN

## 2014-12-21 MED ORDER — ENOXAPARIN SODIUM 30 MG/0.3ML ~~LOC~~ SOLN
30.0000 mg | Freq: Two times a day (BID) | SUBCUTANEOUS | Status: DC
  Administered 2014-12-22 – 2014-12-23 (×3): 30 mg via SUBCUTANEOUS
  Filled 2014-12-21 (×5): qty 0.3

## 2014-12-21 MED ORDER — ACETAMINOPHEN 650 MG RE SUPP: 650.0000 mg | Freq: Four times a day (QID) | RECTAL | Status: DC | PRN

## 2014-12-21 SURGICAL SUPPLY — 68 items
BAG DECANTER FOR FLEXI CONT (MISCELLANEOUS) IMPLANT
BAG ZIPLOCK 12X15 (MISCELLANEOUS) ×2 IMPLANT
BANDAGE ELASTIC 4 VELCRO ST LF (GAUZE/BANDAGES/DRESSINGS) ×2 IMPLANT
BANDAGE ELASTIC 6 VELCRO ST LF (GAUZE/BANDAGES/DRESSINGS) ×2 IMPLANT
BANDAGE ESMARK 6X9 LF (GAUZE/BANDAGES/DRESSINGS) ×1 IMPLANT
BLADE SAG 18X100X1.27 (BLADE) ×2 IMPLANT
BLADE SAW SGTL 13.0X1.19X90.0M (BLADE) ×2 IMPLANT
BNDG CMPR 9X6 STRL LF SNTH (GAUZE/BANDAGES/DRESSINGS) ×1
BNDG ESMARK 6X9 LF (GAUZE/BANDAGES/DRESSINGS) ×2
CAP KNEE TOTAL 3 SIGMA ×2 IMPLANT
CEMENT HV SMART SET (Cement) ×4 IMPLANT
CUFF TOURN SGL QUICK 34 (TOURNIQUET CUFF) ×2
CUFF TRNQT CYL 34X4X40X1 (TOURNIQUET CUFF) ×1 IMPLANT
DECANTER SPIKE VIAL GLASS SM (MISCELLANEOUS) ×2 IMPLANT
DRAPE EXTREMITY T 121X128X90 (DRAPE) ×2 IMPLANT
DRAPE POUCH INSTRU U-SHP 10X18 (DRAPES) ×2 IMPLANT
DRAPE SHEET LG 3/4 BI-LAMINATE (DRAPES) ×2 IMPLANT
DRAPE U-SHAPE 47X51 STRL (DRAPES) ×2 IMPLANT
DRSG AQUACEL AG ADV 3.5X10 (GAUZE/BANDAGES/DRESSINGS) ×2 IMPLANT
DRSG TEGADERM 4X4.75 (GAUZE/BANDAGES/DRESSINGS) ×2 IMPLANT
DURAPREP 26ML APPLICATOR (WOUND CARE) ×4 IMPLANT
ELECT REM PT RETURN 9FT ADLT (ELECTROSURGICAL) ×2
ELECTRODE REM PT RTRN 9FT ADLT (ELECTROSURGICAL) ×1 IMPLANT
EVACUATOR 1/8 PVC DRAIN (DRAIN) ×2 IMPLANT
FACESHIELD WRAPAROUND (MASK) ×10 IMPLANT
GAUZE SPONGE 2X2 8PLY STRL LF (GAUZE/BANDAGES/DRESSINGS) ×1 IMPLANT
GLOVE BIOGEL PI IND STRL 8 (GLOVE) ×2 IMPLANT
GLOVE BIOGEL PI INDICATOR 8 (GLOVE) ×2
GLOVE SURG ORTHO 8.0 STRL STRW (GLOVE) ×2 IMPLANT
GLOVE SURG ORTHO 9.0 STRL STRW (GLOVE) ×2 IMPLANT
GLOVE SURG SS PI 7.5 STRL IVOR (GLOVE) ×2 IMPLANT
GOWN STRL REUS W/TWL XL LVL3 (GOWN DISPOSABLE) ×4 IMPLANT
HANDPIECE INTERPULSE COAX TIP (DISPOSABLE) ×1
IMMOBILIZER KNEE 20 (SOFTGOODS) ×2
IMMOBILIZER KNEE 20 THIGH 36 (SOFTGOODS) ×1 IMPLANT
KIT BASIN OR (CUSTOM PROCEDURE TRAY) ×2 IMPLANT
LIQUID BAND (GAUZE/BANDAGES/DRESSINGS) ×2 IMPLANT
NDL SAFETY ECLIPSE 18X1.5 (NEEDLE) ×1 IMPLANT
NEEDLE HYPO 18GX1.5 SHARP (NEEDLE) ×2
NS IRRIG 1000ML POUR BTL (IV SOLUTION) ×2 IMPLANT
PACK TOTAL JOINT (CUSTOM PROCEDURE TRAY) ×2 IMPLANT
PEN SKIN MARKING BROAD (MISCELLANEOUS) ×2 IMPLANT
POSITIONER SURGICAL ARM (MISCELLANEOUS) ×2 IMPLANT
SET HNDPC FAN SPRY TIP SCT (DISPOSABLE) ×1 IMPLANT
SET PAD KNEE POSITIONER (MISCELLANEOUS) ×2 IMPLANT
SPONGE GAUZE 2X2 STER 10/PKG (GAUZE/BANDAGES/DRESSINGS) ×1
SPONGE LAP 18X18 X RAY DECT (DISPOSABLE) IMPLANT
SPONGE SURGIFOAM ABS GEL 100 (HEMOSTASIS) ×2 IMPLANT
STOCKINETTE 6  STRL (DRAPES) ×1
STOCKINETTE 6 STRL (DRAPES) ×1 IMPLANT
SUCTION FRAZIER 12FR DISP (SUCTIONS) ×2 IMPLANT
SUT BONE WAX W31G (SUTURE) IMPLANT
SUT MNCRL AB 3-0 PS2 18 (SUTURE) ×2 IMPLANT
SUT VIC AB 1 CT1 27 (SUTURE) ×4
SUT VIC AB 1 CT1 27XBRD ANTBC (SUTURE) ×4 IMPLANT
SUT VIC AB 2-0 CT1 27 (SUTURE) ×4
SUT VIC AB 2-0 CT1 TAPERPNT 27 (SUTURE) ×2 IMPLANT
SUT VLOC 180 0 24IN GS25 (SUTURE) ×2 IMPLANT
SYR 50ML LL SCALE MARK (SYRINGE) ×2 IMPLANT
TAPE STRIPS DRAPE STRL (GAUZE/BANDAGES/DRESSINGS) ×2 IMPLANT
TOWEL OR 17X26 10 PK STRL BLUE (TOWEL DISPOSABLE) ×2 IMPLANT
TOWEL OR NON WOVEN STRL DISP B (DISPOSABLE) ×2 IMPLANT
TOWER CARTRIDGE SMART MIX (DISPOSABLE) ×2 IMPLANT
TRAY FOLEY W/METER SILVER 14FR (SET/KITS/TRAYS/PACK) IMPLANT
TRAY FOLEY W/METER SILVER 16FR (SET/KITS/TRAYS/PACK) ×2 IMPLANT
WATER STERILE IRR 1500ML POUR (IV SOLUTION) ×4 IMPLANT
WRAP KNEE MAXI GEL POST OP (GAUZE/BANDAGES/DRESSINGS) ×2 IMPLANT
YANKAUER SUCT BULB TIP NO VENT (SUCTIONS) ×2 IMPLANT

## 2014-12-21 NOTE — Transfer of Care (Signed)
Immediate Anesthesia Transfer of Care Note  Patient: Lawrence Thornton.  Procedure(s) Performed: Procedure(s): RIGHT TOTAL KNEE ARTHROPLASTY (Right)  Patient Location: PACU  Anesthesia Type:Spinal  Level of Consciousness: awake, alert , oriented and patient cooperative  Airway & Oxygen Therapy: Patient Spontanous Breathing and Patient connected to face mask oxygen  Post-op Assessment: Report given to RN and Post -op Vital signs reviewed and stable  Post vital signs: stable  Last Vitals:  Filed Vitals:   12/21/14 1645  BP:   Pulse:   Temp: 36.8 C  Resp:     Complications: No apparent anesthesia complications

## 2014-12-21 NOTE — Transfer of Care (Deleted)
Immediate Anesthesia Transfer of Care Note  Patient: Lawrence Thornton.  Procedure(s) Performed: Procedure(s): RIGHT TOTAL KNEE ARTHROPLASTY (Right)  Patient Location: PACU  Anesthesia Type:Spinal  Level of Consciousness: awake, alert , oriented and patient cooperative  Airway & Oxygen Therapy: Patient Spontanous Breathing and Patient connected to face mask oxygen  Post-op Assessment: Report given to RN and Post -op Vital signs reviewed and stable  Post vital signs: stable  Last Vitals:  Filed Vitals:   12/21/14 1645  BP:   Pulse:   Temp: 36.8 C  Resp:     Complications: No apparent anesthesia complications  L4 spinal level, moving left leg

## 2014-12-21 NOTE — Anesthesia Preprocedure Evaluation (Addendum)
Anesthesia Evaluation  Patient identified by MRN, date of birth, ID band Patient awake    Reviewed: Allergy & Precautions, NPO status , Patient's Chart, lab work & pertinent test results  Airway Mallampati: II  TM Distance: >3 FB Neck ROM: Full    Dental no notable dental hx.    Pulmonary former smoker,    Pulmonary exam normal breath sounds clear to auscultation       Cardiovascular hypertension, Pt. on medications Normal cardiovascular exam Rhythm:Regular Rate:Normal     Neuro/Psych negative neurological ROS  negative psych ROS   GI/Hepatic negative GI ROS, Neg liver ROS,   Endo/Other  negative endocrine ROS  Renal/GU negative Renal ROS  negative genitourinary   Musculoskeletal  (+) Arthritis ,   Abdominal (+) + obese,   Peds negative pediatric ROS (+)  Hematology negative hematology ROS (+)   Anesthesia Other Findings   Reproductive/Obstetrics negative OB ROS                           Anesthesia Physical Anesthesia Plan  ASA: II  Anesthesia Plan: Spinal   Post-op Pain Management:    Induction: Intravenous  Airway Management Planned: Natural Airway  Additional Equipment:   Intra-op Plan:   Post-operative Plan:   Informed Consent: I have reviewed the patients History and Physical, chart, labs and discussed the procedure including the risks, benefits and alternatives for the proposed anesthesia with the patient or authorized representative who has indicated his/her understanding and acceptance.   Dental advisory given  Plan Discussed with: CRNA  Anesthesia Plan Comments: (Discussed risks and benefits of and differences between spinal and general. Discussed risks of spinal including headache, backache, failure, bleeding and hematoma, infection, and nerve damage and paralysis. Patient consents to spinal. Questions answered. Coagulation studies and platelet count acceptable. )         Anesthesia Quick Evaluation

## 2014-12-21 NOTE — Anesthesia Postprocedure Evaluation (Signed)
  Anesthesia Post-op Note  Patient: Lawrence Thornton.  Procedure(s) Performed: Procedure(s) (LRB): RIGHT TOTAL KNEE ARTHROPLASTY (Right)  Patient Location: PACU  Anesthesia Type: Spinal  Level of Consciousness: awake and alert   Airway and Oxygen Therapy: Patient Spontanous Breathing  Post-op Pain: mild  Post-op Assessment: Post-op Vital signs reviewed, Patient's Cardiovascular Status Stable, Respiratory Function Stable, Patent Airway and No signs of Nausea or vomiting  Last Vitals:  Filed Vitals:   12/21/14 1745  BP: 126/80  Pulse: 86  Temp: 36.6 C  Resp: 16    Post-op Vital Signs: stable   Complications: No apparent anesthesia complications

## 2014-12-21 NOTE — Op Note (Signed)
DATE OF SURGERY:  12/21/2014  TIME: 4:35 PM  PATIENT NAME:  Lawrence Thornton.    AGE: 59 y.o.   PRE-OPERATIVE DIAGNOSIS:  RIGHT KNEE OA  POST-OPERATIVE DIAGNOSIS:  RIGHT KNEE OA  PROCEDURE:  Procedure(s): RIGHT TOTAL KNEE ARTHROPLASTY  SURGEON:  Nikitha Mode ANDREW  ASSISTANT:  Bryson Stilwell, PA-C, present and scrubbed throughout the case, critical for assistance with exposure, retraction, instrumentation, and closure.  OPERATIVE IMPLANTS: Depuy PFC Sigma Rotating Platform.  Femur size 5, Tibia size 5, Patella size 38  3-peg oval button, with a 10 mm polyethylene insert.   PREOPERATIVE INDICATIONS:   Lawrence Thornton. is a 59 y.o. year old male with end stage bone on bone arthritis of the knee who failed conservative treatment and elected for Total Knee Arthroplasty.   The risks, benefits, and alternatives were discussed at length including but not limited to the risks of infection, bleeding, nerve injury, stiffness, blood clots, the need for revision surgery, cardiopulmonary complications, among others, and they were willing to proceed.  OPERATIVE DESCRIPTION:  The patient was brought to the operative room and placed in a supine position.  Spinal anesthesia was administered.  IV antibiotics were given.  The lower extremity was prepped and draped in the usual sterile fashion.  Time out was performed.  The leg was elevated and exsanguinated and the tourniquet was inflated.  Anterior quadriceps tendon splitting approach was performed.  The patella was retracted and osteophytes were removed.  The anterior horn of the medial and lateral meniscus was removed and cruciate ligaments resected.   The distal femur was opened with the drill and the intramedullary distal femoral cutting jig was utilized, set at 5 degrees resecting 10 mm off the distal femur.  Care was taken to protect the collateral ligaments.  The distal femoral sizing jig was applied, taking care to avoid  notching.  Then the 4-in-1 cutting jig was applied and the anterior and posterior femur was cut, along with the chamfer cuts.    Then the extramedullary tibial cutting jig was utilized making the appropriate cut using the anterior tibial crest as a reference building in appropriate posterior slope.  Care was taken during the cut to protect the medial and collateral ligaments.  The proximal tibia was removed along with the posterior horns of the menisci.   The posterior medial femoral osteophytes and posterior lateral femoral osteophytes were removed.    The flexion gap was then measured and was symmetric with the extension gap, measured at 10.  I completed the distal femoral preparation using the appropriate jig to prepare the box.  The patella was then measured, and cut with the saw.    The proximal tibia sized and prepared accordingly with the reamer and the punch, and then all components were trialed with the trial insert.  The knee was found to have excellent balance and full motion.    The above named components were then cemented into place and all excess cement was removed.  The trial polyethylene component was in place during cementation, and then was exchanged for the real polyethylene component.    The knee was easily taken through a range of motion and the patella tracked well and the knee irrigated copiously and the parapatellar and subcutaneous tissue closed with vicryl, and monocryl with steri strips for the skin.  The arthrotomy was closed at 90 of flexion. The wounds were dressed with sterile gauze and the tourniquet released and the patient was awakened and returned  to the PACU in stable and satisfactory condition.  There were no complications.  Total tourniquet time was 115 minutes.

## 2014-12-21 NOTE — Anesthesia Procedure Notes (Signed)
Spinal  End time: 12/21/2014 2:04 PM Staffing Anesthesiologist: Franne Grip Performed by: anesthesiologist  Preanesthetic Checklist Completed: patient identified, site marked, surgical consent, pre-op evaluation, timeout performed, IV checked, risks and benefits discussed and monitors and equipment checked Spinal Block Patient position: sitting Prep: Betadine Patient monitoring: continuous pulse ox, heart rate and blood pressure Approach: midline Location: L3-4 Injection technique: single-shot Needle Needle type: Sprotte  Needle gauge: 24 G Assessment Sensory level: T4 Additional Notes Pt tolerated spinal well. Drugs  And spinal kit within date. CRNA attempted x2 prior to Dr. Delma Post. Negative heme or paresthesia

## 2014-12-21 NOTE — Interval H&P Note (Signed)
History and Physical Interval Note:  12/21/2014 1:38 PM  Lawrence Thornton.  has presented today for surgery, with the diagnosis of RIGHT KNEE OA  The various methods of treatment have been discussed with the patient and family. After consideration of risks, benefits and other options for treatment, the patient has consented to  Procedure(s): RIGHT TOTAL KNEE ARTHROPLASTY (Right) as a surgical intervention .  The patient's history has been reviewed, patient examined, no change in status, stable for surgery.  I have reviewed the patient's chart and labs.  Questions were answered to the patient's satisfaction.     Lawrence Thornton ANDREW

## 2014-12-22 LAB — CBC
HEMATOCRIT: 37.3 % — AB (ref 39.0–52.0)
HEMOGLOBIN: 12.7 g/dL — AB (ref 13.0–17.0)
MCH: 29.8 pg (ref 26.0–34.0)
MCHC: 34 g/dL (ref 30.0–36.0)
MCV: 87.6 fL (ref 78.0–100.0)
Platelets: 243 10*3/uL (ref 150–400)
RBC: 4.26 MIL/uL (ref 4.22–5.81)
RDW: 13.2 % (ref 11.5–15.5)
WBC: 15.5 10*3/uL — AB (ref 4.0–10.5)

## 2014-12-22 LAB — BASIC METABOLIC PANEL WITH GFR
Anion gap: 6 (ref 5–15)
BUN: 12 mg/dL (ref 6–20)
CO2: 28 mmol/L (ref 22–32)
Calcium: 9.3 mg/dL (ref 8.9–10.3)
Chloride: 102 mmol/L (ref 101–111)
Creatinine, Ser: 0.84 mg/dL (ref 0.61–1.24)
GFR calc Af Amer: >60 mL/min
GFR calc non Af Amer: >60 mL/min
Glucose, Bld: 155 mg/dL — ABNORMAL HIGH (ref 65–99)
Potassium: 4.9 mmol/L (ref 3.5–5.1)
Sodium: 136 mmol/L (ref 135–145)

## 2014-12-22 NOTE — Progress Notes (Signed)
Patient ID: Lawrence Thornton., male   DOB: [birth year], 59 y.o.   MRN: [id number]    Subjective: 1 Day Post-Op Procedure(s) (LRB): RIGHT TOTAL KNEE ARTHROPLASTY (Right) Patient reports pain as 5 on 0-10 scale.   Denies CP or SOB.  Voiding without difficulty. Positive flatus. Objective: Vital signs in last 24 hours: Temp:  [97.5 F (36.4 C)-98.5 F (36.9 C)] 98 F (36.7 C) (09/17 0525) Pulse Rate:  [74-95] 84 (09/17 0525) Resp:  [14-21] 16 (09/17 0525) BP: (125-163)/(62-96) 128/96 mmHg (09/17 0525) SpO2:  [95 %-100 %] 96 % (09/17 0525) Weight:  [127.461 kg (281 lb)] 127.461 kg (281 lb) (09/16 1745)  Intake/Output from previous day: 09/16 0701 - 09/17 0700 In: 3560 [P.O.:360; I.V.:3200] Out: 2445 [Urine:2295; Drains:50; Blood:100] Intake/Output this shift: Total I/O In: 240 [P.O.:240] Out: -   Labs:  Recent Labs  12/22/14 0422  HGB 12.7*    Recent Labs  12/22/14 0422  WBC 15.5*  RBC 4.26  HCT 37.3*  PLT 243    Recent Labs  12/22/14 0422  NA 136  K 4.9  CL 102  CO2 28  BUN 12  CREATININE 0.84  GLUCOSE 155*  CALCIUM 9.3   No results for input(s): LABPT, INR in the last 72 hours.  Physical Exam: Neurologically intact ABD soft Sensation intact distally Intact pulses distally Dorsiflexion/Plantar flexion intact Incision: dressing C/D/I and scant drainage Compartment soft  Assessment/Plan: 1 Day Post-Op Procedure(s) (LRB): RIGHT TOTAL KNEE ARTHROPLASTY (Right) Advance diet Up with therapy Plan for discharge tomorrow  Pulled Drain applied steri strips and bandage  [place], Baxter Kail for Dr. Venita Lick [facility] [phone] 12/22/2014, 8:40 AM

## 2014-12-22 NOTE — Evaluation (Signed)
Physical Therapy Evaluation Patient Details Name: Lawrence Thornton. MRN: [id number] DOB: 1955/11/16 Today's Date: 12/22/2014   History of Present Illness  R TKA  Clinical Impression  Patient ambulated short distance this visit. Just took medication. Patient will benefit from PT to address problems listed in note below to DC to home.    Follow Up Recommendations Home health PT;Supervision/Assistance - 24 hour    Equipment Recommendations  None recommended by PT    Recommendations for Other Services       Precautions / Restrictions Precautions Precautions: Knee Required Braces or Orthoses: Knee Immobilizer - Right Knee Immobilizer - Right: Discontinue once straight leg raise with < 10 degree lag      Mobility  Bed Mobility Overal bed mobility: Needs Assistance Bed Mobility: Supine to Sit     Supine to sit: Min assist     General bed mobility comments: R leg support. use of rail  Transfers Overall transfer level: Needs assistance Equipment used: Rolling walker (2 wheeled) Transfers: Sit to/from Stand Sit to Stand: Min assist         General transfer comment: cues for hand placement  Ambulation/Gait Ambulation/Gait assistance: Min assist Ambulation Distance (Feet): 50 Feet Assistive device: Rolling walker (2 wheeled) Gait Pattern/deviations: Step-to pattern;Antalgic     General Gait Details: cues for posture and sequence  Stairs            Wheelchair Mobility    Modified Rankin (Stroke Patients Only)       Balance                                             Pertinent Vitals/Pain Pain Assessment: 0-10 Pain Score: 6  Pain Descriptors / Indicators: Aching;Discomfort;Grimacing Pain Intervention(s): Limited activity within patient's tolerance;Patient requesting pain meds-RN notified;Premedicated before session;Repositioned;Ice applied    Home Living Family/patient expects to be discharged to:: Private residence Living  Arrangements: Spouse/significant other Available Help at Discharge: Family Type of Home: House Home Access: Stairs to enter Entrance Stairs-Rails: Right Entrance Stairs-Number of Steps: 4 Home Layout: One level Home Equipment: Environmental consultant - 2 wheels;Cane - single point Additional Comments: patient states tg=hat he sleeps in a recliner    Prior Function Level of Independence: Independent               Hand Dominance        Extremity/Trunk Assessment               Lower Extremity Assessment: RLE deficits/detail RLE Deficits / Details: able t perform SLR    Cervical / Trunk Assessment: Normal  Communication   Communication: No difficulties  Cognition Arousal/Alertness: Awake/alert Behavior During Therapy: WFL for tasks assessed/performed Overall Cognitive Status: Within Functional Limits for tasks assessed                      General Comments      Exercises        Assessment/Plan    PT Assessment Patient needs continued PT services  PT Diagnosis Difficulty walking   PT Problem List Decreased strength;Decreased range of motion;Decreased activity tolerance;Decreased mobility;Decreased knowledge of precautions;Decreased knowledge of use of DME;Pain  PT Treatment Interventions DME instruction;Gait training;Stair training;Functional mobility training;Therapeutic exercise;Therapeutic activities;Patient/family education   PT Goals (Current goals can be found in the Care Plan section) Acute Rehab PT Goals Patient Stated Goal: to go  home PT Goal Formulation: With patient Time For Goal Achievement: 12/25/14 Potential to Achieve Goals: Good    Frequency 7X/week   Barriers to discharge        Co-evaluation               End of Session Equipment Utilized During Treatment: Right knee immobilizer Activity Tolerance: Patient tolerated treatment well Patient left: in chair;with call bell/phone within reach Nurse Communication: Mobility status          Time: 1610-9604 PT Time Calculation (min) (ACUTE ONLY): 30 min   Charges:   PT Evaluation $Initial PT Evaluation Tier I: 1 Procedure PT Treatments $Gait Training: 8-22 mins   PT G Codes:        [name] 12/22/2014, 9:18 AM [name] PT [phone]

## 2014-12-22 NOTE — Progress Notes (Signed)
PT Cancellation Note  Patient Details Name: Lawrence Thornton. MRN: [id number] DOB: 08/15/1955   Cancelled Treatment:    Reason Eval/Treat Not Completed: Pain limiting ability to participate at 1400.   [name] 12/22/2014, 2:49 PM [name] PT [phone]

## 2014-12-22 NOTE — Progress Notes (Signed)
OT Cancellation Note  Patient Details Name: Lawrence Thornton. MRN: [id number] DOB: 1956/02/29   Cancelled Treatment:    Reason Eval/Treat Not Completed: OT screened, no needs identified, will sign off . Pt has had previous knee sx and does not need OT.  He has been able to access his commode without DME and does not feel he needs anything after this sx.  SPENCER,MARYELLEN 12/22/2014, 11:36 AM  Marica Otter, OTR/L [phone] 12/22/2014

## 2014-12-22 NOTE — Progress Notes (Signed)
Physical Therapy Treatment Patient Details Name: Lawrence Thornton. MRN: [id number] DOB: 1955/06/06 Today's Date: 12/22/2014    History of Present Illness R TKA    PT Comments    Patient tolerated ther exer.Had just ambulated to/from bathroom.  Follow Up Recommendations  Home health PT;Supervision/Assistance - 24 hour     Equipment Recommendations       Recommendations for Other Services       Precautions / Restrictions Precautions Precautions: Knee    Mobility  Bed Mobility                  Transfers                    Ambulation/Gait                 Stairs            Wheelchair Mobility    Modified Rankin (Stroke Patients Only)       Balance                                    Cognition Arousal/Alertness: Awake/alert                          Exercises Total Joint Exercises Ankle Circles/Pumps: AROM;Both;10 reps;Supine Quad Sets: AROM;Both;10 reps;Supine Heel Slides: AAROM;Right;10 reps;Supine Hip ABduction/ADduction: AAROM;Right;10 reps;Supine Straight Leg Raises: AAROM;Right;10 reps;Supine    General Comments        Pertinent Vitals/Pain Pain Score: 4  Pain Location: R knee Pain Descriptors / Indicators: Aching;Tightness Pain Intervention(s): Premedicated before session;Ice applied    Home Living                      Prior Function            PT Goals (current goals can now be found in the care plan section) Progress towards PT goals: Progressing toward goals    Frequency  7X/week    PT Plan Current plan remains appropriate    Co-evaluation             End of Session   Activity Tolerance: Patient tolerated treatment well Patient left: in chair;with call bell/phone within reach     Time: 1051-1106 PT Time Calculation (min) (ACUTE ONLY): 15 min  Charges:  $Therapeutic Exercise: 8-22 mins                    G Codes:      Lawrence Thornton 12/22/2014, 2:48 PM

## 2014-12-23 LAB — CBC
HCT: 35 % — ABNORMAL LOW (ref 39.0–52.0)
Hemoglobin: 11.4 g/dL — ABNORMAL LOW (ref 13.0–17.0)
MCH: 28.8 pg (ref 26.0–34.0)
MCHC: 32.6 g/dL (ref 30.0–36.0)
MCV: 88.4 fL (ref 78.0–100.0)
PLATELETS: 220 10*3/uL (ref 150–400)
RBC: 3.96 MIL/uL — AB (ref 4.22–5.81)
RDW: 13.5 % (ref 11.5–15.5)
WBC: 10.4 10*3/uL (ref 4.0–10.5)

## 2014-12-23 NOTE — Progress Notes (Signed)
   Subjective:  Patient reports pain as mild to moderate.  Denies N/V/CP/SOB. Wants to go home.  Objective:   VITALS:   Filed Vitals:   12/22/14 0917 12/22/14 1406 12/22/14 2121 12/23/14 0506  BP: 141/68 137/76 136/75 139/67  Pulse: 83 91 82 78  Temp: 98.6 F (37 C) 98.5 F (36.9 C) 98.4 F (36.9 C) 98.3 F (36.8 C)  TempSrc: Oral Oral Oral Oral  Resp: Height:      Weight:      SpO2: 97% 95% 96% 98%    Sensation intact distally Intact pulses distally Dorsiflexion/Plantar flexion intact Incision: scant drainage No cellulitis present Compartment soft   Lab Results  Component Value Date   WBC 10.4 12/23/2014   HGB 11.4* 12/23/2014   HCT 35.0* 12/23/2014   MCV 88.4 12/23/2014   PLT 220 12/23/2014   BMET    Component Value Date/Time   NA 136 12/22/2014 0422   K 4.9 12/22/2014 0422   CL 102 12/22/2014 0422   CO2 28 12/22/2014 0422   GLUCOSE 155* 12/22/2014 0422   BUN 12 12/22/2014 0422   CREATININE 0.84 12/22/2014 0422   CALCIUM 9.3 12/22/2014 0422   GFRNONAA >60 12/22/2014 0422   GFRAA >60 12/22/2014 0422     Assessment/Plan: 2 Days Post-Op   Active Problems:   S/P knee replacement   Osteoarthritis of right knee   WBAT with walker DVT ppx: lovenox in house, home on ASA PO pain control Discharge home with home health   Swinteck, Cloyde Reams 12/23/2014, 8:13 AM   Samson Frederic, MD Cell [phone]

## 2014-12-23 NOTE — Progress Notes (Signed)
Physical Therapy Treatment Patient Details Name: Lawrence Thornton. MRN: [id number] DOB: June 25, 1955 Today's Date: 12/23/2014    History of Present Illness R TKA    PT Comments    Encouraged elevation of the R leg. Assisted TEDS onto R  Leg. Pt ready for DC.  Follow Up Recommendations  Home health PT;Supervision/Assistance - 24 hour     Equipment Recommendations       Recommendations for Other Services       Precautions / Restrictions Precautions Precautions: Knee    Mobility  Bed Mobility   Bed Mobility: Sit to Supine       Sit to supine: Modified independent (Device/Increase time)   General bed mobility comments: used sheet to lift leg onto bed.  Transfers Overall transfer level: Needs assistance Equipment used: Rolling walker (2 wheeled) Transfers: Sit to/from Stand Sit to Stand: Modified independent (Device/Increase time)         General transfer comment: cues for hand placement  Ambulation/Gait Ambulation/Gait assistance: Modified independent (Device/Increase time) Ambulation Distance (Feet): 100 Feet Assistive device: Rolling walker (2 wheeled) Gait Pattern/deviations: Step-to pattern;Antalgic     General Gait Details: cues for posture and sequence   Stairs Stairs: Yes Stairs assistance: Supervision Stair Management: Step to pattern;Backwards;With walker Number of Stairs: 1 General stair comments: cues for safety  Wheelchair Mobility    Modified Rankin (Stroke Patients Only)       Balance                                    Cognition Arousal/Alertness: Awake/alert                          Exercises Total Joint Exercises Ankle Circles/Pumps: AROM;Both;10 reps;Supine Quad Sets: AROM;Both;10 reps;Supine Hip ABduction/ADduction: AAROM;Right;10 reps;Supine Straight Leg Raises: AAROM;Right;10 reps;Supine Goniometric ROM: 10-60 knee flexion R    General Comments        Pertinent Vitals/Pain Pain  Score: 6  Pain Location: R knee Pain Descriptors / Indicators: Aching;Tightness Pain Intervention(s): Limited activity within patient's tolerance;Monitored during session;Premedicated before session;Repositioned;Ice applied    Home Living                      Prior Function            PT Goals (current goals can now be found in the care plan section) Progress towards PT goals: Progressing toward goals    Frequency       PT Plan Current plan remains appropriate    Co-evaluation             End of Session   Activity Tolerance: Patient tolerated treatment well Patient left: in bed;with call bell/phone within reach     Time: 1043-1106 PT Time Calculation (min) (ACUTE ONLY): 23 min  Charges:  $Gait Training: 8-22 mins $Therapeutic Exercise: 8-22 mins                    G Codes:      [name] 12/23/2014, 2:35 PM

## 2014-12-23 NOTE — Discharge Instructions (Signed)
° ° °TOTAL KNEE REPLACEMENT POSTOPERATIVE DIRECTIONS ° ° ° °Knee Rehabilitation, Guidelines Following Surgery  °Results after knee surgery are often greatly improved when you follow the exercise, range of motion and muscle strengthening exercises prescribed by your doctor. Safety measures are also important to protect the knee from further injury. Any time any of these exercises cause you to have increased pain or swelling in your knee joint, decrease the amount until you are comfortable again and slowly increase them. If you have problems or questions, call your caregiver or physical therapist for advice.  ° °WEIGHT BEARING °Weight bearing as tolerated with assist device (walker, cane, etc) as directed, use it as long as suggested by your surgeon or therapist, typically at least 4-6 weeks. ° °HOME CARE INSTRUCTIONS  °Remove items at home which could result in a fall. This includes throw rugs or furniture in walking pathways.  °Continue medications as instructed at time of discharge. °You may have some home medications which will be placed on hold until you complete the course of blood thinner medication.  °You may start showering once you are discharged home but do not submerge the incision under water. Just pat the incision dry and apply a dry gauze dressing on daily. °Walk with walker as instructed.  °You may resume a sexual relationship in one month or when given the OK by your doctor.  °· Use walker as long as suggested by your caregivers. °· Avoid periods of inactivity such as sitting longer than an hour when not asleep. This helps prevent blood clots.  °You may put full weight on your legs and walk as much as is comfortable.  °You may return to work once you are cleared by your doctor.  °Do not drive a car for 6 weeks or until released by you surgeon.  °· Do not drive while taking narcotics.  °Wear the elastic stockings for three weeks following surgery during the day but you may remove then at night. °Make  sure you keep all of your appointments after your operation with all of your doctors and caregivers. You should call the office at the above phone number and make an appointment for approximately two weeks after the date of your surgery. °Do not remove your surgical dressing. The dressing is waterproof; you may take showers in 3 days, but do not take tub baths or submerge the dressing. °Please pick up a stool softener and laxative for home use as long as you are requiring pain medications. °· ICE to the affected knee every three hours for 30 minutes at a time and then as needed for pain and swelling.  Continue to use ice on the knee for pain and swelling from surgery. You may notice swelling that will progress down to the foot and ankle.  This is normal after surgery.  Elevate the leg when you are not up walking on it.   °It is important for you to complete the blood thinner medication as prescribed by your doctor. °· Continue to use the breathing machine which will help keep your temperature down.  It is common for your temperature to cycle up and down following surgery, especially at night when you are not up moving around and exerting yourself.  The breathing machine keeps your lungs expanded and your temperature down. ° °RANGE OF MOTION AND STRENGTHENING EXERCISES  °Rehabilitation of the knee is important following a knee injury or an operation. After just a few days of immobilization, the muscles of the thigh   which control the knee become weakened and shrink (atrophy). Knee exercises are designed to build up the tone and strength of the thigh muscles and to improve knee motion. Often times heat used for twenty to thirty minutes before working out will loosen up your tissues and help with improving the range of motion but do not use heat for the first two weeks following surgery. These exercises can be done on a training (exercise) mat, on the floor, on a table or on a bed. Use what ever works the best and is  most comfortable for you Knee exercises include:  °Leg Lifts - While your knee is still immobilized in a splint or cast, you can do straight leg raises. Lift the leg to 60 degrees, hold for 3 sec, and slowly lower the leg. Repeat 10-20 times 2-3 times daily. Perform this exercise against resistance later as your knee gets better.  °Quad and Hamstring Sets - Tighten up the muscle on the front of the thigh (Quad) and hold for 5-10 sec. Repeat this 10-20 times hourly. Hamstring sets are done by pushing the foot backward against an object and holding for 5-10 sec. Repeat as with quad sets.  °A rehabilitation program following serious knee injuries can speed recovery and prevent re-injury in the future due to weakened muscles. Contact your doctor or a physical therapist for more information on knee rehabilitation.  ° °SKILLED REHAB INSTRUCTIONS: °If the patient is transferred to a skilled rehab facility following release from the hospital, a list of the current medications will be sent to the facility for the patient to continue.  When discharged from the skilled rehab facility, please have the facility set up the patient's Home Health Physical Therapy prior to being released. Also, the skilled facility will be responsible for providing the patient with their medications at time of release from the facility to include their pain medication, the muscle relaxants, and their blood thinner medication. If the patient is still at the rehab facility at time of the two week follow up appointment, the skilled rehab facility will also need to assist the patient in arranging follow up appointment in our office and any transportation needs. ° °MAKE SURE YOU:  °Understand these instructions.  °Will watch your condition.  °Will get help right away if you are not doing well or get worse.  ° ° °Pick up stool softner and laxative for home use following surgery while on pain medications. °Do NOT remove your dressing. You may shower.  °Do  not take tub baths or submerge incision under water. °May shower starting three days after surgery. °Please use a clean towel to pat the incision dry following showers. °Continue to use ice for pain and swelling after surgery. °Do not use any lotions or creams on the incision until instructed by your surgeon. ° °

## 2014-12-23 NOTE — Discharge Summary (Signed)
Physician Discharge Summary  Patient ID: Lawrence Thornton. MRN: [id number] DOB/AGE: 1955/04/30 59 y.o.  Admit date: 12/21/2014 Discharge date: 12/23/2014  Admission Diagnoses: Rigth knee primary OA  Discharge Diagnoses:  Active Problems:   S/P knee replacement   Osteoarthritis of right knee   Discharged Condition: good  Hospital Course:  Floy Riegler. is a 59 y.o. who was admitted to [facility]. They were brought to the operating room on 12/21/2014 and underwent Procedure(s): RIGHT TOTAL KNEE ARTHROPLASTY.  Patient tolerated the procedure well and was later transferred to the recovery room and then to the orthopaedic floor for postoperative care.  They were given PO and IV analgesics for pain control following their surgery.  They were given 24 hours of postoperative antibiotics of  Anti-infectives    Start     Dose/Rate Route Frequency Ordered Stop   12/21/14 2000  ceFAZolin (ANCEF) IVPB 2 g/50 mL premix     2 g 100 mL/hr over 30 Minutes Intravenous Every 6 hours 12/21/14 1804 12/22/14 0131   12/21/14 0600  ceFAZolin (ANCEF) 3 g in dextrose 5 % 50 mL IVPB     3 g 160 mL/hr over 30 Minutes Intravenous On call to O.R. 12/20/14 1335 12/21/14 1352     and started on DVT prophylaxis in the form of lovenox.   PT and OT were ordered for total joint protocol.  Discharge planning consulted to help with postop disposition and equipment needs.  Patient had a good night on the evening of surgery and started to get up OOB with therapy on day one.  Hemovac drain was pulled without difficulty.  Continued to work with therapy into day two.  Dressing was with normal limits.  The patient had progressed with therapy and meeting their goals. Patient was seen in rounds and was ready to go home.  Consults: n/a  Significant Diagnostic Studies: routine  Treatments: routine  Discharge Exam: Blood pressure 139/67, pulse 78, temperature 98.3 F (36.8 C), temperature source Oral,  resp. rate 16, height 6' (1.829 m), weight 127.461 kg (281 lb), SpO2 98 %. Well nourished. Alert and oriented x3. RRR, Lungs clear, BS x4. Abdomen soft and non tender. Right Calf soft and non tender. Right knee dressing C/D/I. No DVT signs. Compartment soft. No signs of infection.  Right LE grossly neurovascular intact.  Disposition: 01-Home or Self Care  Discharge Instructions    Call MD / Call 911    Complete by:  As directed   If you experience chest pain or shortness of breath, CALL 911 and be transported to the hospital emergency room.  If you develope a fever above 101 F, pus (white drainage) or increased drainage or redness at the wound, or calf pain, call your surgeon's office.     Call MD / Call 911    Complete by:  As directed   If you experience chest pain or shortness of breath, CALL 911 and be transported to the hospital emergency room.  If you develope a fever above 101 F, pus (white drainage) or increased drainage or redness at the wound, or calf pain, call your surgeon's office.     Constipation Prevention    Complete by:  As directed   Drink plenty of fluids.  Prune juice may be helpful.  You may use a stool softener, such as Colace (over the counter) 100 mg twice a day.  Use MiraLax (over the counter) for constipation as needed.     Constipation  Prevention    Complete by:  As directed   Drink plenty of fluids.  Prune juice may be helpful.  You may use a stool softener, such as Colace (over the counter) 100 mg twice a day.  Use MiraLax (over the counter) for constipation as needed.     Diet - low sodium heart healthy    Complete by:  As directed      Diet - low sodium heart healthy    Complete by:  As directed      Discharge instructions    Complete by:  As directed   INSTRUCTIONS AFTER JOINT REPLACEMENT   Remove items at home which could result in a fall. This includes throw rugs or furniture in walking pathways ICE to the affected joint every three hours while awake for  30 minutes at a time, for at least the first 3-5 days, and then as needed for pain and swelling.  Continue to use ice for pain and swelling. You may notice swelling that will progress down to the foot and ankle.  This is normal after surgery.  Elevate your leg when you are not up walking on it.   Continue to use the breathing machine you got in the hospital (incentive spirometer) which will help keep your temperature down.  It is common for your temperature to cycle up and down following surgery, especially at night when you are not up moving around and exerting yourself.  The breathing machine keeps your lungs expanded and your temperature down.   DIET:  As you were doing prior to hospitalization, we recommend a well-balanced diet.  DRESSING / WOUND CARE / SHOWERING  Keep the surgical dressing until follow up.  The dressing is water proof, so you can shower without any extra covering.  IF THE DRESSING FALLS OFF or the wound gets wet inside, change the dressing with sterile gauze.  Please use good hand washing techniques before changing the dressing.  Do not use any lotions or creams on the incision until instructed by your surgeon.    ACTIVITY  Increase activity slowly as tolerated, but follow the weight bearing instructions below.   No driving for 6 weeks or until further direction given by your physician.  You cannot drive while taking narcotics.  No lifting or carrying greater than 10 lbs. until further directed by your surgeon. Avoid periods of inactivity such as sitting longer than an hour when not asleep. This helps prevent blood clots.  You may return to work once you are authorized by your doctor.     WEIGHT BEARING   Weight bearing as tolerated with assist device (walker, cane, etc) as directed, use it as long as suggested by your surgeon or therapist, typically at least 4-6 weeks.   EXERCISES  Results after joint replacement surgery are often greatly improved when you follow the  exercise, range of motion and muscle strengthening exercises prescribed by your doctor. Safety measures are also important to protect the joint from further injury. Any time any of these exercises cause you to have increased pain or swelling, decrease what you are doing until you are comfortable again and then slowly increase them. If you have problems or questions, call your caregiver or physical therapist for advice.   Rehabilitation is important following a joint replacement. After just a few days of immobilization, the muscles of the leg can become weakened and shrink (atrophy).  These exercises are designed to build up the tone and strength of the thigh  and leg muscles and to improve motion. Often times heat used for twenty to thirty minutes before working out will loosen up your tissues and help with improving the range of motion but do not use heat for the first two weeks following surgery (sometimes heat can increase post-operative swelling).   These exercises can be done on a training (exercise) mat, on the floor, on a table or on a bed. Use whatever works the best and is most comfortable for you.    Use music or television while you are exercising so that the exercises are a pleasant break in your day. This will make your life better with the exercises acting as a break in your routine that you can look forward to.   Perform all exercises about fifteen times, three times per day or as directed.  You should exercise both the operative leg and the other leg as well.   Exercises include:   Quad Sets - Tighten up the muscle on the front of the thigh (Quad) and hold for 5-10 seconds.   Straight Leg Raises - With your knee straight (if you were given a brace, keep it on), lift the leg to 60 degrees, hold for 3 seconds, and slowly lower the leg.  Perform this exercise against resistance later as your leg gets stronger.  Leg Slides: Lying on your back, slowly slide your foot toward your buttocks, bending  your knee up off the floor (only go as far as is comfortable). Then slowly slide your foot back down until your leg is flat on the floor again.  Angel Wings: Lying on your back spread your legs to the side as far apart as you can without causing discomfort.  Hamstring Strength:  Lying on your back, push your heel against the floor with your leg straight by tightening up the muscles of your buttocks.  Repeat, but this time bend your knee to a comfortable angle, and push your heel against the floor.  You may put a pillow under the heel to make it more comfortable if necessary.   A rehabilitation program following joint replacement surgery can speed recovery and prevent re-injury in the future due to weakened muscles. Contact your doctor or a physical therapist for more information on knee rehabilitation.    CONSTIPATION  Constipation is defined medically as fewer than three stools per week and severe constipation as less than one stool per week.  Even if you have a regular bowel pattern at home, your normal regimen is likely to be disrupted due to multiple reasons following surgery.  Combination of anesthesia, postoperative narcotics, change in appetite and fluid intake all can affect your bowels.   YOU MUST use at least one of the following options; they are listed in order of increasing strength to get the job done.  They are all available over the counter, and you may need to use some, POSSIBLY even all of these options:    Drink plenty of fluids (prune juice may be helpful) and high fiber foods Colace 100 mg by mouth twice a day  Senokot for constipation as directed and as needed Dulcolax (bisacodyl), take with full glass of water  Miralax (polyethylene glycol) once or twice a day as needed.  If you have tried all these things and are unable to have a bowel movement in the first 3-4 days after surgery call either your surgeon or your primary doctor.    If you experience loose stools or  diarrhea, hold the  medications until you stool forms back up.  If your symptoms do not get better within 1 week or if they get worse, check with your doctor.  If you experience "the worst abdominal pain ever" or develop nausea or vomiting, please contact the office immediately for further recommendations for treatment.   ITCHING:  If you experience itching with your medications, try taking only a single pain pill, or even half a pain pill at a time.  You can also use Benadryl over the counter for itching or also to help with sleep.   TED HOSE STOCKINGS:  Use stockings on both legs until for at least 2 weeks or as directed by physician office. They may be removed at night for sleeping.  MEDICATIONS:  See your medication summary on the "After Visit Summary" that nursing will review with you.  You may have some home medications which will be placed on hold until you complete the course of blood thinner medication.  It is important for you to complete the blood thinner medication as prescribed.  PRECAUTIONS:  If you experience chest pain or shortness of breath - call 911 immediately for transfer to the hospital emergency department.   If you develop a fever greater that 101 F, purulent drainage from wound, increased redness or drainage from wound, foul odor from the wound/dressing, or calf pain - CONTACT YOUR SURGEON.                                                   FOLLOW-UP APPOINTMENTS:  If you do not already have a post-op appointment, please call the office for an appointment to be seen by your surgeon.  Guidelines for how soon to be seen are listed in your "After Visit Summary", but are typically between 1-4 weeks after surgery.  OTHER INSTRUCTIONS:   Knee Replacement:  Do not place pillow under knee, focus on keeping the knee straight while resting. CPM instructions: 0-90 degrees, 2 hours in the morning, 2 hours in the afternoon, and 2 hours in the evening. Place foam block, curve side up under  heel at all times except when in CPM or when walking.  DO NOT modify, tear, cut, or change the foam block in any way.  MAKE SURE YOU:  Understand these instructions.  Get help right away if you are not doing well or get worse.    Thank you for letting us be a part of your medical care team.  It is a privilege we respect greatly.  We hope these instructions will help you stay on track for a fast and full recovery!     Do not put a pillow under the knee. Place it under the heel.    Complete by:  As directed      Driving restrictions    Complete by:  As directed   No driving for 6 weeks     Increase activity slowly as tolerated    Complete by:  As directed      Increase activity slowly as tolerated    Complete by:  As directed      Lifting restrictions    Complete by:  As directed   No lifting for 6 weeks     TED hose    Complete by:  As directed   Use stockings (TED hose) for 2 weeks on  both leg(s).  You may remove them at night for sleeping.            Medication List    STOP taking these medications        azithromycin 250 MG tablet  Commonly known as:  ZITHROMAX Z-PAK      TAKE these medications        aspirin EC 325 MG tablet  Take 1 tablet (325 mg total) by mouth 2 (two) times daily.     doxazosin 8 MG tablet  Commonly known as:  CARDURA  Take 1 tablet (8 mg total) by mouth at bedtime.     gemfibrozil 600 MG tablet  Commonly known as:  LOPID  Take 1 tablet (600 mg total) by mouth 2 (two) times daily before a meal.     Linaclotide 145 MCG Caps capsule  Commonly known as:  LINZESS  Take 145 mcg by mouth as needed (constipation).     lisinopril 20 MG tablet  Commonly known as:  PRINIVIL,ZESTRIL  Take 1 tablet (20 mg total) by mouth at bedtime.     methocarbamol 500 MG tablet  Commonly known as:  ROBAXIN  Take 1 tablet (500 mg total) by mouth every 6 (six) hours as needed for muscle spasms.     OxyCODONE 40 mg T12a 12 hr tablet  Commonly known as:   OXYCONTIN  Take 1 tablet (40 mg total) by mouth every 12 (twelve) hours.     oxyCODONE 5 MG immediate release tablet  Commonly known as:  Oxy IR/ROXICODONE  Take 1 tablet (5 mg total) by mouth every 4 (four) hours as needed for severe pain or breakthrough pain.     oxyCODONE-acetaminophen 10-325 MG per tablet  Commonly known as:  PERCOCET  Take 1 tablet by mouth every 8 (eight) hours as needed for pain.           Follow-up Information    Follow up with Erasmo Leventhal, MD. Schedule an appointment as soon as possible for a visit in 2 weeks.   Specialty:  Orthopedic Surgery   Why:  For wound re-check   Contact information:   [address] [place] [address] [phone]       Follow up with [facility].   Why:  Home Health Physical Therapy   Contact information:   [address] [place] [address] [phone]       Signed: Markham Jordan 12/23/2014, 3:24 PM

## 2014-12-23 NOTE — Care Management Note (Signed)
Case Management Note  Patient Details  Name: Lawrence Thornton. MRN: [id number] Date of Birth: April 26, 1955  Subjective/Objective:     Right TKA               Action/Plan: Home Health referral. NCM spoke to pt and offered choice for [facility]. Pt ageeable to [place] for [facility]. States he had [facility] in the past. Has RW and cane at home. Wife at home to assist with his care.   Expected Discharge Date:  12/23/2014              Expected Discharge Plan:  Home w Home Health Services  In-House Referral:     Discharge planning Services  CM Consult   HH Arranged:  PT [facility] Agency:  [facility]  Status of Service:  Completed, signed off  Medicare Important Message Given:    Date Medicare IM Given:    Medicare IM give by:    Date Additional Medicare IM Given:    Additional Medicare Important Message give by:     If discussed at Long Length of Stay Meetings, dates discussed:    Additional Comments:  Elliot Cousin, RN 12/23/2014, 10:08 AM

## 2014-12-24 ENCOUNTER — Encounter (HOSPITAL_COMMUNITY): Payer: Self-pay | Admitting: Specialist

## 2014-12-26 ENCOUNTER — Encounter: Payer: Self-pay | Admitting: *Deleted

## 2014-12-26 ENCOUNTER — Emergency Department
Admission: EM | Admit: 2014-12-26 | Discharge: 2014-12-27 | Disposition: A | Discharge status: Home / Self Care | Payer: BLUE CROSS/BLUE SHIELD | Attending: Emergency Medicine | Admitting: Emergency Medicine

## 2014-12-26 DIAGNOSIS — Z79899 Other long term (current) drug therapy: Secondary | ICD-10-CM | POA: Insufficient documentation

## 2014-12-26 DIAGNOSIS — Z7982 Long term (current) use of aspirin: Secondary | ICD-10-CM | POA: Diagnosis not present

## 2014-12-26 DIAGNOSIS — I10 Essential (primary) hypertension: Secondary | ICD-10-CM | POA: Insufficient documentation

## 2014-12-26 DIAGNOSIS — R509 Fever, unspecified: Secondary | ICD-10-CM | POA: Diagnosis present

## 2014-12-26 DIAGNOSIS — Z87891 Personal history of nicotine dependence: Secondary | ICD-10-CM | POA: Insufficient documentation

## 2014-12-26 DIAGNOSIS — Z79891 Long term (current) use of opiate analgesic: Secondary | ICD-10-CM | POA: Diagnosis not present

## 2014-12-26 DIAGNOSIS — L03115 Cellulitis of right lower limb: Secondary | ICD-10-CM

## 2014-12-26 NOTE — ED Notes (Signed)
Patient states he had a temperature of 100.6 orally this evening at approximately 1945.Marland Kitchen Patient states he took 10 mg Percocet at [facility]. Patient had right knee replacement surgery on 9/16 and was told to come in if he had a fever. Patient states he feels like he has had an increase in pain at the incision site. Patient is warm to the touch, diaphoretic and pale.

## 2014-12-27 LAB — CBC
HEMATOCRIT: 33.3 % — AB (ref 40.0–52.0)
HEMOGLOBIN: 11.2 g/dL — AB (ref 13.0–18.0)
MCH: 29 pg (ref 26.0–34.0)
MCHC: 33.6 g/dL (ref 32.0–36.0)
MCV: 86.3 fL (ref 80.0–100.0)
Platelets: 328 10*3/uL (ref 150–440)
RBC: 3.86 MIL/uL — AB (ref 4.40–5.90)
RDW: 13.6 % (ref 11.5–14.5)
WBC: 11.7 10*3/uL — AB (ref 3.8–10.6)

## 2014-12-27 LAB — URINALYSIS COMPLETE WITH MICROSCOPIC (ARMC ONLY)
BACTERIA UA: NONE SEEN
Bilirubin Urine: NEGATIVE
GLUCOSE, UA: NEGATIVE mg/dL
KETONES UR: NEGATIVE mg/dL
Leukocytes, UA: NEGATIVE
NITRITE: NEGATIVE
PROTEIN: NEGATIVE mg/dL
SPECIFIC GRAVITY, URINE: 1.031 — AB (ref 1.005–1.030)
pH: 5 (ref 5.0–8.0)

## 2014-12-27 LAB — COMPREHENSIVE METABOLIC PANEL
ALK PHOS: 56 U/L (ref 38–126)
ALT: 18 U/L (ref 17–63)
AST: 23 U/L (ref 15–41)
Albumin: 3.7 g/dL (ref 3.5–5.0)
Anion gap: 10 (ref 5–15)
BUN: 13 mg/dL (ref 6–20)
CALCIUM: 9.2 mg/dL (ref 8.9–10.3)
CO2: 23 mmol/L (ref 22–32)
CREATININE: 0.74 mg/dL (ref 0.61–1.24)
Chloride: 99 mmol/L — ABNORMAL LOW (ref 101–111)
Glucose, Bld: 173 mg/dL — ABNORMAL HIGH (ref 65–99)
Potassium: 4.2 mmol/L (ref 3.5–5.1)
Sodium: 132 mmol/L — ABNORMAL LOW (ref 135–145)
Total Bilirubin: 0.8 mg/dL (ref 0.3–1.2)
Total Protein: 7.6 g/dL (ref 6.5–8.1)

## 2014-12-27 MED ORDER — MORPHINE SULFATE (PF) 4 MG/ML IV SOLN
INTRAVENOUS | Status: AC
  Filled 2014-12-27: qty 1

## 2014-12-27 MED ORDER — MORPHINE SULFATE (PF) 4 MG/ML IV SOLN
4.0000 mg | Freq: Once | INTRAVENOUS | Status: AC
  Administered 2014-12-27: 4 mg via INTRAVENOUS

## 2014-12-27 MED ORDER — CEPHALEXIN 500 MG PO CAPS
500.0000 mg | ORAL_CAPSULE | Freq: Once | ORAL | Status: AC
  Administered 2014-12-27: 500 mg via ORAL
  Filled 2014-12-27: qty 1

## 2014-12-27 MED ORDER — MORPHINE SULFATE (PF) 2 MG/ML IV SOLN: 2.0000 mg | Freq: Once | INTRAVENOUS | Status: DC

## 2014-12-27 MED ORDER — CEPHALEXIN 500 MG PO CAPS: 500.0000 mg | ORAL_CAPSULE | Freq: Two times a day (BID) | ORAL | Status: AC

## 2014-12-27 NOTE — ED Provider Notes (Signed)
[facility] Emergency Department Provider Note  ____________________________________________  Time seen: 11:45 PM  I have reviewed the triage vital signs and the nursing notes.   HISTORY  Chief Complaint Fever      HPI Lawrence Thornton. is a 59 y.o. male status post right knee replacement performed by Dr. Thomasena Edis on 12/21/2014 presents with fever at home MAXIMUM TEMPERATURE 100.6 at 7:45 PM this evening. Patient denies any cough no dysuria no urinary frequency or urgency. Patient does however admit to warmth and increased pain at the incision site. Of note patient took a Percocet at 8:00 PM before presenting to the emergency department     Past Medical History  Diagnosis Date  . Hypertension   . Arthritis     oa    Patient Active Problem List   Diagnosis Date Noted  . S/P knee replacement 12/21/2014  . Osteoarthritis of right knee 12/21/2014  . Pharyngitis 10/22/2014  . Chronic low back pain 09/21/2014  . BPH associated with nocturia 09/21/2014  . Dyslipidemia 09/21/2014  . Continuous opioid dependence 09/21/2014  . Chronic LBP 09/21/2014  . Chronic constipation 09/21/2014  . Benign fibroma of prostate 09/21/2014  . DDD (degenerative disc disease), lumbar 09/21/2014  . Failure of erection 09/21/2014  . BP (high blood pressure) 09/21/2014  . Long term current use of opiate analgesic 09/21/2014  . Primary osteoarthritis of left knee 08/10/2014  . Status post total left knee replacement using cement 08/10/2014  . S/P knee surgery 08/10/2014    Past Surgical History  Procedure Laterality Date  . Back surgery  lower back surgery x 3     L 3 L 4 and L 5 are fused together  . Appendectomy  age 78  . Sebaceous cyst removed Right 15 years ago    middle  finger   . Toe fusion Right     4th toe  . Metal removed from eye  years ago  . 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 Joint replacement    . Total knee arthroplasty Right 12/21/2014    Procedure: RIGHT TOTAL KNEE ARTHROPLASTY;  Surgeon: Eugenia Mcalpine, MD;  Location: [facility] ORS;  Service: Orthopedics;  Laterality: Right;    Current Outpatient Rx  Name  Route  Sig  Dispense  Refill  . aspirin EC 325 MG tablet   Oral   Take 1 tablet (325 mg total) by mouth 2 (two) times daily.   60 tablet   0   . doxazosin (CARDURA) 8 MG tablet   Oral   Take 1 tablet (8 mg total) by mouth at bedtime.   90 tablet   3   . gemfibrozil (LOPID) 600 MG tablet   Oral   Take 1 tablet (600 mg total) by mouth 2 (two) times daily before a meal.   180 tablet   0   . Linaclotide (LINZESS) 145 MCG CAPS capsule   Oral   Take 145 mcg by mouth as needed (constipation).          Marland Kitchen lisinopril (PRINIVIL,ZESTRIL) 20 MG tablet   Oral   Take 1 tablet (20 mg total) by mouth at bedtime.   90 tablet   0   . methocarbamol (ROBAXIN) 500 MG tablet   Oral   Take 1 tablet (500 mg total) by mouth every 6 (six) hours as  needed for muscle spasms.   50 tablet   2   . oxyCODONE (OXY IR/ROXICODONE) 5 MG immediate release tablet   Oral   Take 1 tablet (5 mg total) by mouth every 4 (four) hours as needed for severe pain or breakthrough pain.   60 tablet   0   . OxyCODONE (OXYCONTIN) 40 mg T12A 12 hr tablet   Oral   Take 1 tablet (40 mg total) by mouth every 12 (twelve) hours.   60 tablet   0   . oxyCODONE-acetaminophen (PERCOCET) 10-325 MG per tablet   Oral   Take 1 tablet by mouth every 8 (eight) hours as needed for pain.   90 tablet   0     Allergies No known drug allergies  Family History  Problem Relation Age of Onset  . Cancer Mother     Throat  . Hypertension Father   . Hyperlipidemia Father     Social History Social History  Substance Use Topics  . Smoking status: Former Smoker    Quit date: 04/07/1999  . Smokeless tobacco: Never  Used  . Alcohol Use: No    Review of Systems  Constitutional: Positive for fever. Eyes: Negative for visual changes. ENT: Negative for sore throat. Cardiovascular: Negative for chest pain. Respiratory: Negative for shortness of breath. Gastrointestinal: Negative for abdominal pain, vomiting and diarrhea. Genitourinary: Negative for dysuria. Musculoskeletal: Negative for back pain. Skin: Negative for rash. Neurolog ical: Negative for headaches, focal weakness or numbness.   10-point ROS otherwise negative.  ____________________________________________   PHYSICAL EXAM:  VITAL SIGNS: ED Triage Vitals  Enc Vitals Group     BP 12/26/14 2315 134/79 mmHg     Pulse Rate 12/26/14 2315 120     Resp 12/26/14 2315 18     Temp 12/26/14 2315 98.5 F (36.9 C)     Temp Source 12/26/14 2315 Oral     SpO2 12/26/14 2315 99 %     Weight 12/26/14 2315 283 lb (128.368 kg)     Height 12/26/14 2315 6' (1.829 m)     Head Cir --      Peak Flow --      Pain Score 12/26/14 2316 5     Pain Loc --      Pain Edu? --      Excl. in GC? --     Constitutional: Alert and oriented. Well appearing and in no distress. Eyes: Conjunctivae are normal. PERRL. Normal extraocular movements. ENT   Head: Normocephalic and atraumatic.   Nose: No congestion/rhinnorhea.   Mouth/Throat: Mucous membranes are moist.   Neck: No stridor. Cardiovascular: Normal rate, regular rhythm. Normal and symmetric distal pulses are present in all extremities. No murmurs, rubs, or gallops. Respiratory: Normal respiratory effort without tachypnea nor retractions. Breath sounds are clear and equal bilaterally. No wheezes/rales/rhonchi. Gastrointestinal: Soft and nontender. No distention. There is no CVA tenderness. Genitourinary: deferred Musculoskeletal: Nontender with normal range of motion in all extremities. No joint effusions.  No lower extremity tenderness nor edema. Neurologic:  Normal speech and language. No  gross focal neurologic deficits are appreciated. Speech is normal.  Skin:  Mild erythema noted superior aspect of the patient's right knee surgical incision site. Psychiatric: Mood and affect are normal. Speech and behavior are normal. Patient exhibits appropriate insight and judgment.  ____________________________________________    LABS (pertinent positives/negatives)  Labs Reviewed  CBC - Abnormal; Notable for the following:    WBC 11.7 (*)    RBC 3.86 (*)  Hemoglobin 11.2 (*)    HCT 33.3 (*)    All other components within normal limits  COMPREHENSIVE METABOLIC PANEL - Abnormal; Notable for the following:    Sodium 132 (*)    Chloride 99 (*)    Glucose, Bld 173 (*)    All other components within normal limits  URINALYSIS COMPLETEWITH MICROSCOPIC ([facility] ONLY) - Abnormal; Notable for the following:    Color, Urine YELLOW (*)    APPearance CLEAR (*)    Specific Gravity, Urine 1.031 (*)    Hgb urine dipstick 1+ (*)    Squamous Epithelial / LPF 0-5 (*)    All other components within normal limits        INITIAL IMPRESSION / ASSESSMENT AND PLAN / ED COURSE  Pertinent labs & imaging results that were available during my care of the patient were reviewed by me and considered in my medical decision making (see chart for details).  Given history of physical exam concern for possible early wound infection. As such patient received Keflex 500 mg after discussing with Dr. Lequita Halt orthopedic on call for [facility] orthopedists who agreed with plan. Patient is advised to follow-up with the orthopedist tomorrow  ____________________________________________   FINAL CLINICAL IMPRESSION(S) / ED DIAGNOSES  Final diagnoses:  Cellulitis of right knee      Darci Current, MD 12/27/14 [phone]

## 2014-12-27 NOTE — ED Notes (Signed)
Pt provided a urinal and given a urine sample

## 2014-12-27 NOTE — ED Notes (Signed)
Pts right knee was re-bandaged and pt was given pain meds. Pt in NAD at this time .

## 2014-12-27 NOTE — Discharge Instructions (Signed)

## 2014-12-28 ENCOUNTER — Other Ambulatory Visit (HOSPITAL_COMMUNITY): Payer: Self-pay | Admitting: Specialist

## 2014-12-28 ENCOUNTER — Encounter (HOSPITAL_COMMUNITY): Payer: BLUE CROSS/BLUE SHIELD

## 2014-12-28 ENCOUNTER — Ambulatory Visit (HOSPITAL_COMMUNITY)
Admission: RE | Admit: 2014-12-28 | Discharge: 2014-12-28 | Disposition: A | Discharge status: Home / Self Care | Payer: BLUE CROSS/BLUE SHIELD | Source: Ambulatory Visit | Attending: Orthopedic Surgery | Admitting: Orthopedic Surgery

## 2014-12-28 DIAGNOSIS — Z96651 Presence of right artificial knee joint: Secondary | ICD-10-CM | POA: Insufficient documentation

## 2014-12-28 DIAGNOSIS — M7989 Other specified soft tissue disorders: Secondary | ICD-10-CM | POA: Diagnosis not present

## 2014-12-28 DIAGNOSIS — R52 Pain, unspecified: Secondary | ICD-10-CM

## 2014-12-28 NOTE — Progress Notes (Signed)
*  PRELIMINARY RESULTS* Vascular Ultrasound Right lower extremity venous duplex has been completed.  Preliminary findings: negative for DVT and superficial thrombosis in visualized veins.  Called results to Dr. Thomasena Edis office.    Farrel Demark, RDMS, RVT  12/28/2014, 11:45 AM

## 2015-01-08 ENCOUNTER — Other Ambulatory Visit: Payer: Self-pay | Admitting: Family Medicine

## 2015-01-22 ENCOUNTER — Ambulatory Visit (INDEPENDENT_AMBULATORY_CARE_PROVIDER_SITE_OTHER): Payer: BLUE CROSS/BLUE SHIELD | Admitting: Family Medicine

## 2015-01-22 ENCOUNTER — Encounter: Payer: Self-pay | Admitting: Family Medicine

## 2015-01-22 VITALS — BP 132/70 | HR 109 | Temp 97.7°F | Resp 18 | Wt 276.2 lb

## 2015-01-22 DIAGNOSIS — R7303 Prediabetes: Secondary | ICD-10-CM

## 2015-01-22 DIAGNOSIS — G8929 Other chronic pain: Secondary | ICD-10-CM | POA: Diagnosis not present

## 2015-01-22 DIAGNOSIS — Z96651 Presence of right artificial knee joint: Secondary | ICD-10-CM

## 2015-01-22 DIAGNOSIS — E668 Other obesity: Secondary | ICD-10-CM | POA: Insufficient documentation

## 2015-01-22 DIAGNOSIS — M545 Low back pain: Secondary | ICD-10-CM

## 2015-01-22 DIAGNOSIS — J329 Chronic sinusitis, unspecified: Secondary | ICD-10-CM | POA: Insufficient documentation

## 2015-01-22 DIAGNOSIS — E781 Pure hyperglyceridemia: Secondary | ICD-10-CM | POA: Insufficient documentation

## 2015-01-22 LAB — POCT GLYCOSYLATED HEMOGLOBIN (HGB A1C): HEMOGLOBIN A1C: 5.6

## 2015-01-22 LAB — POCT UA - MICROALBUMIN: MICROALBUMIN (UR) POC: 20 mg/L

## 2015-01-22 MED ORDER — OXYCODONE HCL ER 40 MG PO T12A: 40.0000 mg | EXTENDED_RELEASE_TABLET | Freq: Two times a day (BID) | ORAL | Status: DC

## 2015-01-22 MED ORDER — OXYCODONE-ACETAMINOPHEN 10-325 MG PO TABS: 1.0000 | ORAL_TABLET | Freq: Three times a day (TID) | ORAL | Status: DC | PRN

## 2015-01-22 NOTE — Progress Notes (Signed)
Name: Lawrence PaulsMarshall R Beshears Jr.   MRN: [id number]    DOB: [birth year]   Date:01/22/2015       Progress Note  Subjective  Chief Complaint  Chief Complaint  Patient presents with  . Medication Refill    patient is here for a 411-month f/u and need a refill of his pain medication (Oxycontin 40mg  & Percocet 10/325mg )     Back Pain This is a chronic problem. The pain is present in the lumbar spine. The pain does not radiate. The pain is at a severity of 3/10. The symptoms are aggravated by bending and position. Pertinent negatives include no bladder incontinence, bowel incontinence or fever. He has tried analgesics for the symptoms. The treatment provided significant relief.    Past Medical History  Diagnosis Date  . Hypertension   . Arthritis     oa    Past Surgical History  Procedure Laterality Date  . Back surgery  lower back surgery x 3     L 3 L 4 and L 5 are fused together  . Appendectomy  age 59  . Sebaceous cyst removed Right 15 years ago    middle  finger   . Toe fusion Right     4th toe  . Metal removed from eye  years ago  . 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 Joint replacement    . Total knee arthroplasty Right 12/21/2014    Procedure: RIGHT TOTAL KNEE ARTHROPLASTY;  Surgeon: Eugenia Mcalpineobert Collins, MD;  Location: [facility];  Service: Orthopedics;  Laterality: Right;    Family History  Problem Relation Age of Onset  . Cancer Mother     Throat  . Hypertension Father   . Hyperlipidemia Father     Social History   Social History  . Marital Status: Married    Spouse Name: N/A  . Number of Children: N/A  . Years of Education: N/A   Occupational History  . Not on file.   Social History Main Topics  . Smoking status: Former Smoker    Quit date: 04/07/1999  . Smokeless tobacco: Never Used  . Alcohol Use: No  . Drug Use: No  .  Sexual Activity: Yes    Birth Control/ Protection: Condom   Other Topics Concern  . Not on file   Social History Narrative     Current outpatient prescriptions:  .  aspirin EC 325 MG tablet, Take 1 tablet (325 mg total) by mouth 2 (two) times daily., Disp: 60 tablet, Rfl: 0 .  doxazosin (CARDURA) 8 MG tablet, Take 1 tablet (8 mg total) by mouth at bedtime., Disp: 90 tablet, Rfl: 3 .  gemfibrozil (LOPID) 600 MG tablet, take 1 tablet by mouth twice a day before meals, Disp: 180 tablet, Rfl: 0 .  Linaclotide (LINZESS) 145 MCG CAPS capsule, Take 145 mcg by mouth as needed (constipation). , Disp: , Rfl:  .  lisinopril (PRINIVIL,ZESTRIL) 20 MG tablet, Take 1 tablet (20 mg total) by mouth at bedtime., Disp: 90 tablet, Rfl: 0 .  methocarbamol (ROBAXIN) 500 MG tablet, Take 1 tablet (500 mg total) by mouth every 6 (six) hours as needed for muscle spasms., Disp: 50 tablet, Rfl: 2 .  oxyCODONE (OXY IR/ROXICODONE) 5 MG immediate release tablet, Take 1 tablet (5 mg total) by mouth every 4 (four) hours as needed for severe pain or breakthrough pain.,  Disp: 60 tablet, Rfl: 0 .  OxyCODONE (OXYCONTIN) 40 mg T12A 12 hr tablet, Take 1 tablet (40 mg total) by mouth every 12 (twelve) hours., Disp: 60 tablet, Rfl: 0 .  oxyCODONE-acetaminophen (PERCOCET) 10-325 MG per tablet, Take 1 tablet by mouth every 8 (eight) hours as needed for pain., Disp: 90 tablet, Rfl: 0  No Known Allergies   Review of Systems  Constitutional: Negative for fever and chills.  Gastrointestinal: Negative for bowel incontinence.  Genitourinary: Negative for bladder incontinence.  Musculoskeletal: Positive for back pain and joint pain.   Objective  Filed Vitals:   01/22/15 1003  BP: 132/70  Pulse: 109  Temp: 97.7 F (36.5 C)  TempSrc: Oral  Resp: 18  Weight: 276 lb 3.2 oz (125.283 kg)  SpO2: 97%   Physical Exam  Constitutional: He is oriented to person, place, and time and well-developed, well-nourished, and in no distress.    Cardiovascular: Normal rate and regular rhythm.   No murmur heard. Pulmonary/Chest: Effort normal and breath sounds normal. No respiratory distress. He has no wheezes.  Musculoskeletal:       Back:  Surgical incision scar on right knee.  Neurological: He is alert and oriented to person, place, and time.  Nursing note and vitals reviewed.   Assessment & Plan  1. Chronic low back pain Symptoms of chronic low back pain are stable and controlled on scheduled and as needed opioid therapy. Patient compliant with controlled substances agreement. - OxyCODONE (OXYCONTIN) 40 mg T12A 12 hr tablet; Take 1 tablet (40 mg total) by mouth every 12 (twelve) hours.  Dispense: 60 tablet; Refill: 0 - oxyCODONE-acetaminophen (PERCOCET) 10-325 MG tablet; Take 1 tablet by mouth every 8 (eight) hours as needed for pain.  Dispense: 90 tablet; Refill: 0  2. Status post total right knee replacement Patient is under a pain contract with this practice. He 70 underwent a right TKR and informed the orthopedic surgeon about the existence of the pain contract. He was prescribed oxycodone by the orthopedic surgeon after surgery to take for breakthrough pain. He took some oxycodone when the pain was particularly severe in the early operative days but has stopped taking the oxycodone now.   3. Prediabetes  - POCT glycosylated hemoglobin (Hb A1C) - POCT UA - Microalbumin   Doral Ventrella Asad A[facility] 01/22/2015 10:15 AM

## 2015-02-22 ENCOUNTER — Encounter: Payer: Self-pay | Admitting: Family Medicine

## 2015-02-22 ENCOUNTER — Ambulatory Visit (INDEPENDENT_AMBULATORY_CARE_PROVIDER_SITE_OTHER): Payer: BLUE CROSS/BLUE SHIELD | Admitting: Family Medicine

## 2015-02-22 VITALS — BP 130/70 | HR 109 | Temp 98.9°F | Resp 19 | Ht 72.0 in | Wt 273.7 lb

## 2015-02-22 DIAGNOSIS — G8929 Other chronic pain: Secondary | ICD-10-CM

## 2015-02-22 DIAGNOSIS — M545 Low back pain, unspecified: Secondary | ICD-10-CM

## 2015-02-22 DIAGNOSIS — Z23 Encounter for immunization: Secondary | ICD-10-CM | POA: Diagnosis not present

## 2015-02-22 DIAGNOSIS — I10 Essential (primary) hypertension: Secondary | ICD-10-CM | POA: Diagnosis not present

## 2015-02-22 MED ORDER — OXYCODONE-ACETAMINOPHEN 10-325 MG PO TABS: 1.0000 | ORAL_TABLET | Freq: Three times a day (TID) | ORAL | Status: DC | PRN

## 2015-02-22 MED ORDER — LISINOPRIL 20 MG PO TABS: 20.0000 mg | ORAL_TABLET | Freq: Every day | ORAL | Status: DC

## 2015-02-22 MED ORDER — OXYCODONE HCL ER 40 MG PO T12A: 40.0000 mg | EXTENDED_RELEASE_TABLET | Freq: Two times a day (BID) | ORAL | Status: DC

## 2015-02-22 NOTE — Progress Notes (Signed)
Name: Lawrence PaulsMarshall R Borel Jr.   MRN: [id number]    DOB: 09/11/1955   Date:02/22/2015       Progress Note  Subjective  Chief Complaint  Chief Complaint  Patient presents with  . Follow-up    1 mo  . Hyperlipidemia  . Medication Refill    lisinopril 20mg  / oxycontin 40mg  / oxycodone 10/325    Back Pain This is a chronic problem. The problem is unchanged. The pain is present in the lumbar spine. The pain does not radiate. The pain is at a severity of 3/10. The pain is moderate. The symptoms are aggravated by bending and position. Pertinent negatives include no bladder incontinence, bowel incontinence, chest pain, fever, headaches, numbness or paresis. He has tried analgesics for the symptoms. The treatment provided significant relief.  Hypertension This is a chronic problem. The problem is controlled. Pertinent negatives include no anxiety, blurred vision, chest pain or headaches. Risk factors for coronary artery disease include dyslipidemia, obesity and male gender. Past treatments include ACE inhibitors. There is no history of kidney disease, CAD/MI or CVA.    Past Medical History  Diagnosis Date  . Hypertension   . Arthritis     oa    Past Surgical History  Procedure Laterality Date  . Back surgery  lower back surgery x 3     L 3 L 4 and L 5 are fused together  . Appendectomy  age 59  . Sebaceous cyst removed Right 15 years ago    middle  finger   . Toe fusion Right     4th toe  . Metal removed from eye  years ago  . 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 Joint replacement    . Total knee arthroplasty Right 12/21/2014    Procedure: RIGHT TOTAL KNEE ARTHROPLASTY;  Surgeon: Eugenia Mcalpineobert Collins, MD;  Location: [facility];  Service: Orthopedics;  Laterality: Right;  . Total knee arthroplasty Right 12/21/14    Family History  Problem Relation Age of  Onset  . Cancer Mother     Throat  . Hypertension Father   . Hyperlipidemia Father     Social History   Social History  . Marital Status: Married    Spouse Name: N/A  . Number of Children: N/A  . Years of Education: N/A   Occupational History  . Not on file.   Social History Main Topics  . Smoking status: Former Smoker    Quit date: 04/07/1999  . Smokeless tobacco: Never Used  . Alcohol Use: No  . Drug Use: No  . Sexual Activity: Yes    Birth Control/ Protection: Condom   Other Topics Concern  . Not on file   Social History Narrative     Current outpatient prescriptions:  .  doxazosin (CARDURA) 8 MG tablet, Take 1 tablet (8 mg total) by mouth at bedtime., Disp: 90 tablet, Rfl: 3 .  gemfibrozil (LOPID) 600 MG tablet, take 1 tablet by mouth twice a day before meals, Disp: 180 tablet, Rfl: 0 .  Linaclotide (LINZESS) 145 MCG CAPS capsule, Take 145 mcg by mouth as needed (constipation). , Disp: , Rfl:  .  lisinopril (PRINIVIL,ZESTRIL) 20 MG tablet, Take 1 tablet (20 mg total) by mouth at bedtime., Disp: 90 tablet, Rfl: 0 .  methocarbamol (ROBAXIN) 500 MG tablet, Take 1 tablet (500 mg total) by mouth every  6 (six) hours as needed for muscle spasms., Disp: 50 tablet, Rfl: 2 .  oxyCODONE (OXY IR/ROXICODONE) 5 MG immediate release tablet, Take 1 tablet (5 mg total) by mouth every 4 (four) hours as needed for severe pain or breakthrough pain., Disp: 60 tablet, Rfl: 0 .  OxyCODONE (OXYCONTIN) 40 mg T12A 12 hr tablet, Take 1 tablet (40 mg total) by mouth every 12 (twelve) hours., Disp: 60 tablet, Rfl: 0 .  oxyCODONE-acetaminophen (PERCOCET) 10-325 MG tablet, Take 1 tablet by mouth every 8 (eight) hours as needed for pain., Disp: 90 tablet, Rfl: 0 .  aspirin EC 325 MG tablet, Take 1 tablet (325 mg total) by mouth 2 (two) times daily. (Patient not taking: Reported on 02/22/2015), Disp: 60 tablet, Rfl: 0  No Known Allergies   Review of Systems  Constitutional: Negative for fever.    Eyes: Negative for blurred vision.  Cardiovascular: Negative for chest pain.  Gastrointestinal: Negative for bowel incontinence.  Genitourinary: Negative for bladder incontinence.  Musculoskeletal: Positive for back pain.  Neurological: Negative for numbness and headaches.     Objective  Filed Vitals:   02/22/15 0915  BP: 130/70  Pulse: 109  Temp: 98.9 F (37.2 C)  TempSrc: Oral  Resp: 19  Height: 6' (1.829 m)  Weight: 273 lb 11.2 oz (124.15 kg)  SpO2: 96%    Physical Exam  Constitutional: He is oriented to person, place, and time and well-developed, well-nourished, and in no distress.  Cardiovascular: Normal rate and regular rhythm.   No murmur heard. Pulmonary/Chest: Effort normal and breath sounds normal. No respiratory distress. He has no wheezes.  Musculoskeletal:       Back:  Surgical incision scar on right knee.  Neurological: He is alert and oriented to person, place, and time.  Nursing note and vitals reviewed.    Assessment & Plan  1. Need for immunization against influenza  - Flu Vaccine QUAD 36+ mos PF IM (Fluarix & Fluzone Quad PF)  2. Essential hypertension  - lisinopril (PRINIVIL,ZESTRIL) 20 MG tablet; Take 1 tablet (20 mg total) by mouth at bedtime.  Dispense: 90 tablet; Refill: 0  3. Chronic low back pain Chronic low back pain, stable on opioid therapy. Patient lives with controlled substances agreement and taking medicines as prescribed. Refills provided. - oxyCODONE (OXYCONTIN) 40 mg 12 hr tablet; Take 1 tablet (40 mg total) by mouth every 12 (twelve) hours.  Dispense: 60 tablet; Refill: 0 - oxyCODONE-acetaminophen (PERCOCET) 10-325 MG tablet; Take 1 tablet by mouth every 8 (eight) hours as needed for pain.  Dispense: 90 tablet; Refill: 0    [facility] 02/22/2015 9:32 AM

## 2015-03-22 ENCOUNTER — Ambulatory Visit (INDEPENDENT_AMBULATORY_CARE_PROVIDER_SITE_OTHER): Payer: BLUE CROSS/BLUE SHIELD | Admitting: Family Medicine

## 2015-03-22 ENCOUNTER — Encounter: Payer: Self-pay | Admitting: Family Medicine

## 2015-03-22 VITALS — BP 130/70 | HR 96 | Temp 98.4°F | Resp 15 | Ht 72.0 in | Wt 272.2 lb

## 2015-03-22 DIAGNOSIS — G8929 Other chronic pain: Secondary | ICD-10-CM

## 2015-03-22 DIAGNOSIS — M545 Low back pain: Secondary | ICD-10-CM

## 2015-03-22 MED ORDER — OXYCODONE-ACETAMINOPHEN 10-325 MG PO TABS: 1.0000 | ORAL_TABLET | Freq: Three times a day (TID) | ORAL | Status: DC | PRN

## 2015-03-22 MED ORDER — OXYCODONE HCL ER 40 MG PO T12A: 40.0000 mg | EXTENDED_RELEASE_TABLET | Freq: Two times a day (BID) | ORAL | Status: DC

## 2015-03-22 NOTE — Progress Notes (Signed)
Name: Lawrence PaulsMarshall R Bodkin Jr.   MRN: [id number]    DOB: 1955/11/28   Date:03/22/2015       Progress Note  Subjective  Chief Complaint  Chief Complaint  Patient presents with  . Follow-up    1 MO  . Hyperlipidemia  . Medication Refill    oxycodone    Back Pain This is a chronic problem. The pain is present in the lumbar spine. The pain does not radiate. The pain is at a severity of 3/10. The symptoms are aggravated by bending and position. Pertinent negatives include no bladder incontinence, bowel incontinence or fever. He has tried analgesics for the symptoms. The treatment provided significant relief.     Past Medical History  Diagnosis Date  . Hypertension   . Arthritis     oa    Past Surgical History  Procedure Laterality Date  . Back surgery  lower back surgery x 3     L 3 L 4 and L 5 are fused together  . Appendectomy  age 59  . Sebaceous cyst removed Right 15 years ago    middle  finger   . Toe fusion Right     4th toe  . Metal removed from eye  years ago  . 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 Joint replacement    . Total knee arthroplasty Right 12/21/2014    Procedure: RIGHT TOTAL KNEE ARTHROPLASTY;  Surgeon: Eugenia Mcalpineobert Collins, MD;  Location: [facility];  Service: Orthopedics;  Laterality: Right;  . Total knee arthroplasty Right 12/21/14    Family History  Problem Relation Age of Onset  . Cancer Mother     Throat  . Hypertension Father   . Hyperlipidemia Father     Social History   Social History  . Marital Status: Married    Spouse Name: N/A  . Number of Children: N/A  . Years of Education: N/A   Occupational History  . Not on file.   Social History Main Topics  . Smoking status: Former Smoker    Quit date: 04/07/1999  . Smokeless tobacco: Never Used  . Alcohol Use: No  . Drug Use: No  . Sexual Activity: Yes      Birth Control/ Protection: Condom   Other Topics Concern  . Not on file   Social History Narrative     Current outpatient prescriptions:  .  aspirin EC 325 MG tablet, Take 1 tablet (325 mg total) by mouth 2 (two) times daily. (Patient not taking: Reported on 02/22/2015), Disp: 60 tablet, Rfl: 0 .  doxazosin (CARDURA) 8 MG tablet, Take 1 tablet (8 mg total) by mouth at bedtime., Disp: 90 tablet, Rfl: 3 .  gemfibrozil (LOPID) 600 MG tablet, take 1 tablet by mouth twice a day before meals, Disp: 180 tablet, Rfl: 0 .  Linaclotide (LINZESS) 145 MCG CAPS capsule, Take 145 mcg by mouth as needed (constipation). , Disp: , Rfl:  .  lisinopril (PRINIVIL,ZESTRIL) 20 MG tablet, Take 1 tablet (20 mg total) by mouth at bedtime., Disp: 90 tablet, Rfl: 0 .  methocarbamol (ROBAXIN) 500 MG tablet, Take 1 tablet (500 mg total) by mouth every 6 (six) hours as needed for muscle spasms., Disp: 50 tablet, Rfl: 2 .  oxyCODONE (OXY IR/ROXICODONE) 5 MG immediate release tablet, Take 1 tablet (5 mg total) by mouth every 4 (four) hours as needed  for severe pain or breakthrough pain., Disp: 60 tablet, Rfl: 0 .  oxyCODONE (OXYCONTIN) 40 mg 12 hr tablet, Take 1 tablet (40 mg total) by mouth every 12 (twelve) hours., Disp: 60 tablet, Rfl: 0 .  oxyCODONE-acetaminophen (PERCOCET) 10-325 MG tablet, Take 1 tablet by mouth every 8 (eight) hours as needed for pain., Disp: 90 tablet, Rfl: 0  No Known Allergies   Review of Systems  Constitutional: Negative for fever.  Gastrointestinal: Negative for bowel incontinence.  Genitourinary: Negative for bladder incontinence.  Musculoskeletal: Positive for back pain.    Objective  Filed Vitals:   03/22/15 1004  BP: 130/70  Pulse: 96  Temp: 98.4 F (36.9 C)  TempSrc: Oral  Resp: 15  Height: 6' (1.829 m)  Weight: 272 lb 3.2 oz (123.469 kg)  SpO2: 98%    Physical Exam  Constitutional: He is oriented to person, place, and time and well-developed, well-nourished, and  in no distress.  Cardiovascular: Normal rate and regular rhythm.   No murmur heard. Pulmonary/Chest: Effort normal and breath sounds normal. No respiratory distress. He has no wheezes.  Musculoskeletal:       Lumbar back: He exhibits tenderness, pain and spasm.       Back:  Neurological: He is alert and oriented to person, place, and time.  Nursing note and vitals reviewed.   Assessment & Plan  1. Chronic low back pain  - oxyCODONE (OXYCONTIN) 40 mg 12 hr tablet; Take 1 tablet (40 mg total) by mouth every 12 (twelve) hours.  Dispense: 60 tablet; Refill: 0 - oxyCODONE-acetaminophen (PERCOCET) 10-325 MG tablet; Take 1 tablet by mouth every 8 (eight) hours as needed for pain.  Dispense: 90 tablet; Refill: 0   [facility] 03/22/2015 10:24 AM

## 2015-04-22 ENCOUNTER — Encounter: Payer: Self-pay | Admitting: Family Medicine

## 2015-04-22 ENCOUNTER — Ambulatory Visit (INDEPENDENT_AMBULATORY_CARE_PROVIDER_SITE_OTHER): Payer: BLUE CROSS/BLUE SHIELD | Admitting: Family Medicine

## 2015-04-22 VITALS — BP 128/71 | HR 99 | Temp 98.9°F | Resp 17 | Ht 72.0 in | Wt 270.6 lb

## 2015-04-22 DIAGNOSIS — M545 Low back pain: Secondary | ICD-10-CM | POA: Diagnosis not present

## 2015-04-22 DIAGNOSIS — G8929 Other chronic pain: Secondary | ICD-10-CM | POA: Diagnosis not present

## 2015-04-22 DIAGNOSIS — E785 Hyperlipidemia, unspecified: Secondary | ICD-10-CM

## 2015-04-22 MED ORDER — OXYCODONE-ACETAMINOPHEN 10-325 MG PO TABS: 1.0000 | ORAL_TABLET | Freq: Three times a day (TID) | ORAL | Status: DC | PRN

## 2015-04-22 MED ORDER — OXYCODONE HCL ER 40 MG PO T12A: 40.0000 mg | EXTENDED_RELEASE_TABLET | Freq: Two times a day (BID) | ORAL | Status: DC

## 2015-04-22 MED ORDER — GEMFIBROZIL 600 MG PO TABS: ORAL_TABLET | ORAL | Status: DC

## 2015-04-22 NOTE — Progress Notes (Signed)
Name: Lawrence PaulsMarshall R Crail Jr.   MRN: [id number]    DOB: [birth year]   Date:04/22/2015       Progress Note  Subjective  Chief Complaint  Chief Complaint  Patient presents with  . Medication Refill    1 mo   . Hyperlipidemia    Hyperlipidemia This is a chronic problem. The problem is controlled. Recent lipid tests were reviewed and are normal. Exacerbating diseases include obesity. Pertinent negatives include no chest pain, leg pain, myalgias or shortness of breath. Current antihyperlipidemic treatment includes fibric acid derivatives.  Back Pain This is a chronic problem. The pain is present in the lumbar spine. The pain does not radiate. The pain is at a severity of 3/10. The symptoms are aggravated by bending and position. Pertinent negatives include no bladder incontinence, bowel incontinence, chest pain, fever or leg pain. He has tried analgesics for the symptoms. The treatment provided significant relief.    Past Medical History  Diagnosis Date  . Hypertension   . Arthritis     oa    Past Surgical History  Procedure Laterality Date  . Back surgery  lower back surgery x 3     L 3 L 4 and L 5 are fused together  . Appendectomy  age 60  . Sebaceous cyst removed Right 15 years ago    middle  finger   . Toe fusion Right     4th toe  . Metal removed from eye  years ago  . 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 Joint replacement    . Total knee arthroplasty Right 12/21/2014    Procedure: RIGHT TOTAL KNEE ARTHROPLASTY;  Surgeon: Eugenia Mcalpineobert Collins, MD;  Location: [facility];  Service: Orthopedics;  Laterality: Right;  . Total knee arthroplasty Right 12/21/14    Family History  Problem Relation Age of Onset  . Cancer Mother     Throat  . Hypertension Father   . Hyperlipidemia Father     Social History   Social History  . Marital Status:  Married    Spouse Name: N/A  . Number of Children: N/A  . Years of Education: N/A   Occupational History  . Not on file.   Social History Main Topics  . Smoking status: Former Smoker    Quit date: 04/07/1999  . Smokeless tobacco: Never Used  . Alcohol Use: No  . Drug Use: No  . Sexual Activity: Yes    Birth Control/ Protection: Condom   Other Topics Concern  . Not on file   Social History Narrative     Current outpatient prescriptions:  .  doxazosin (CARDURA) 8 MG tablet, Take 1 tablet (8 mg total) by mouth at bedtime., Disp: 90 tablet, Rfl: 3 .  gemfibrozil (LOPID) 600 MG tablet, take 1 tablet by mouth twice a day before meals, Disp: 180 tablet, Rfl: 0 .  Linaclotide (LINZESS) 145 MCG CAPS capsule, Take 145 mcg by mouth as needed (constipation). , Disp: , Rfl:  .  lisinopril (PRINIVIL,ZESTRIL) 20 MG tablet, Take 1 tablet (20 mg total) by mouth at bedtime., Disp: 90 tablet, Rfl: 0 .  oxyCODONE (OXY IR/ROXICODONE) 5 MG immediate release tablet, Take 1 tablet (5 mg total) by mouth every 4 (four) hours as needed for severe pain or breakthrough pain., Disp: 60 tablet, Rfl: 0 .  oxyCODONE (OXYCONTIN) 40 mg 12 hr tablet,  Take 1 tablet (40 mg total) by mouth every 12 (twelve) hours., Disp: 60 tablet, Rfl: 0 .  oxyCODONE-acetaminophen (PERCOCET) 10-325 MG tablet, Take 1 tablet by mouth every 8 (eight) hours as needed for pain., Disp: 90 tablet, Rfl: 0 .  aspirin EC 325 MG tablet, Take 1 tablet (325 mg total) by mouth 2 (two) times daily. (Patient not taking: Reported on 02/22/2015), Disp: 60 tablet, Rfl: 0 .  methocarbamol (ROBAXIN) 500 MG tablet, Take 1 tablet (500 mg total) by mouth every 6 (six) hours as needed for muscle spasms. (Patient not taking: Reported on 04/22/2015), Disp: 50 tablet, Rfl: 2  No Known Allergies   Review of Systems  Constitutional: Negative for fever and chills.  Respiratory: Negative for shortness of breath.   Cardiovascular: Negative for chest pain and  palpitations.  Gastrointestinal: Negative for bowel incontinence.  Genitourinary: Negative for bladder incontinence.  Musculoskeletal: Positive for back pain. Negative for myalgias.    Objective  Filed Vitals:   04/22/15 1016  BP: 128/71  Pulse: 99  Temp: 98.9 F (37.2 C)  TempSrc: Oral  Resp: 17  Height: 6' (1.829 m)  Weight: 270 lb 9.6 oz (122.743 kg)  SpO2: 96%    Physical Exam  Constitutional: He is oriented to person, place, and time and well-developed, well-nourished, and in no distress.  Cardiovascular: Normal rate and regular rhythm.   No murmur heard. Pulmonary/Chest: Effort normal and breath sounds normal. No respiratory distress. He has no wheezes.  Musculoskeletal:       Lumbar back: He exhibits tenderness, pain and spasm.       Back:  Neurological: He is alert and oriented to person, place, and time.  Nursing note and vitals reviewed.   Assessment & Plan  1. Chronic low back pain Stable on chronic opioid therapy. Compliant with controlled substances agreement. Refills provided. - oxyCODONE (OXYCONTIN) 40 mg 12 hr tablet; Take 1 tablet (40 mg total) by mouth every 12 (twelve) hours.  Dispense: 60 tablet; Refill: 0 - oxyCODONE-acetaminophen (PERCOCET) 10-325 MG tablet; Take 1 tablet by mouth every 8 (eight) hours as needed for pain.  Dispense: 90 tablet; Refill: 0  2. Dyslipidemia  - gemfibrozil (LOPID) 600 MG tablet; take 1 tablet by mouth twice a day before meals  Dispense: 180 tablet; Refill: 0   Laren Orama Asad [facility] 04/22/2015 10:35 AM

## 2015-05-05 IMAGING — CR DG LUMBAR SPINE 2-3V
2 series · 4 of 4 positions shown · non-contrast
Comparison: None.

CLINICAL DATA: Follow-up back surgery hardware

EXAM:
LUMBAR SPINE - 2-3 VIEW

[ap]
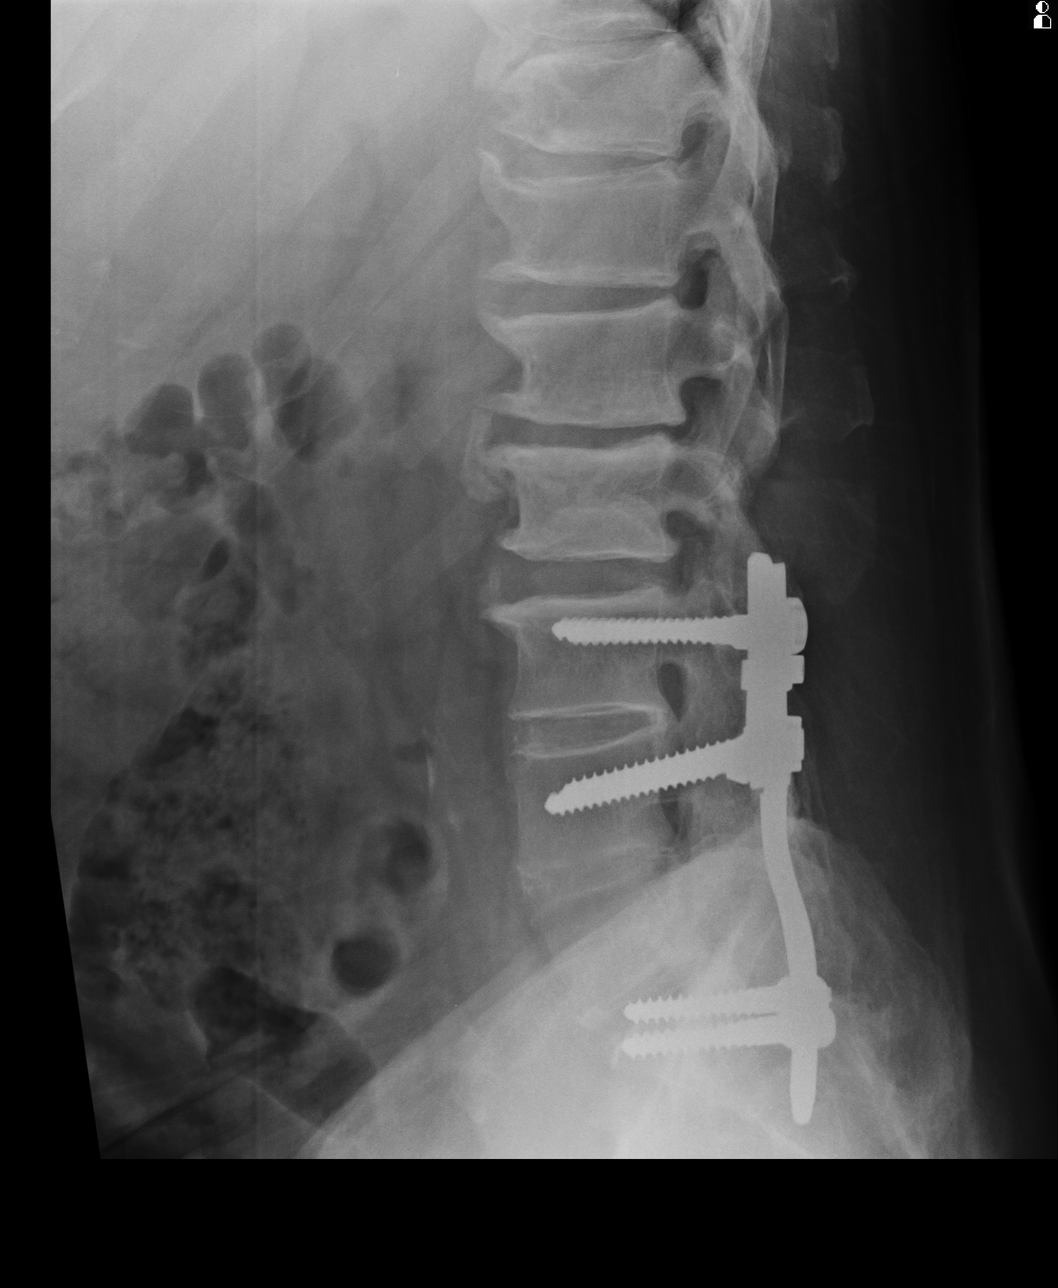

[Series 1: l5/s1 spot · 0.17mm/px · 3 of 3 slices shown]
[im 1/3]
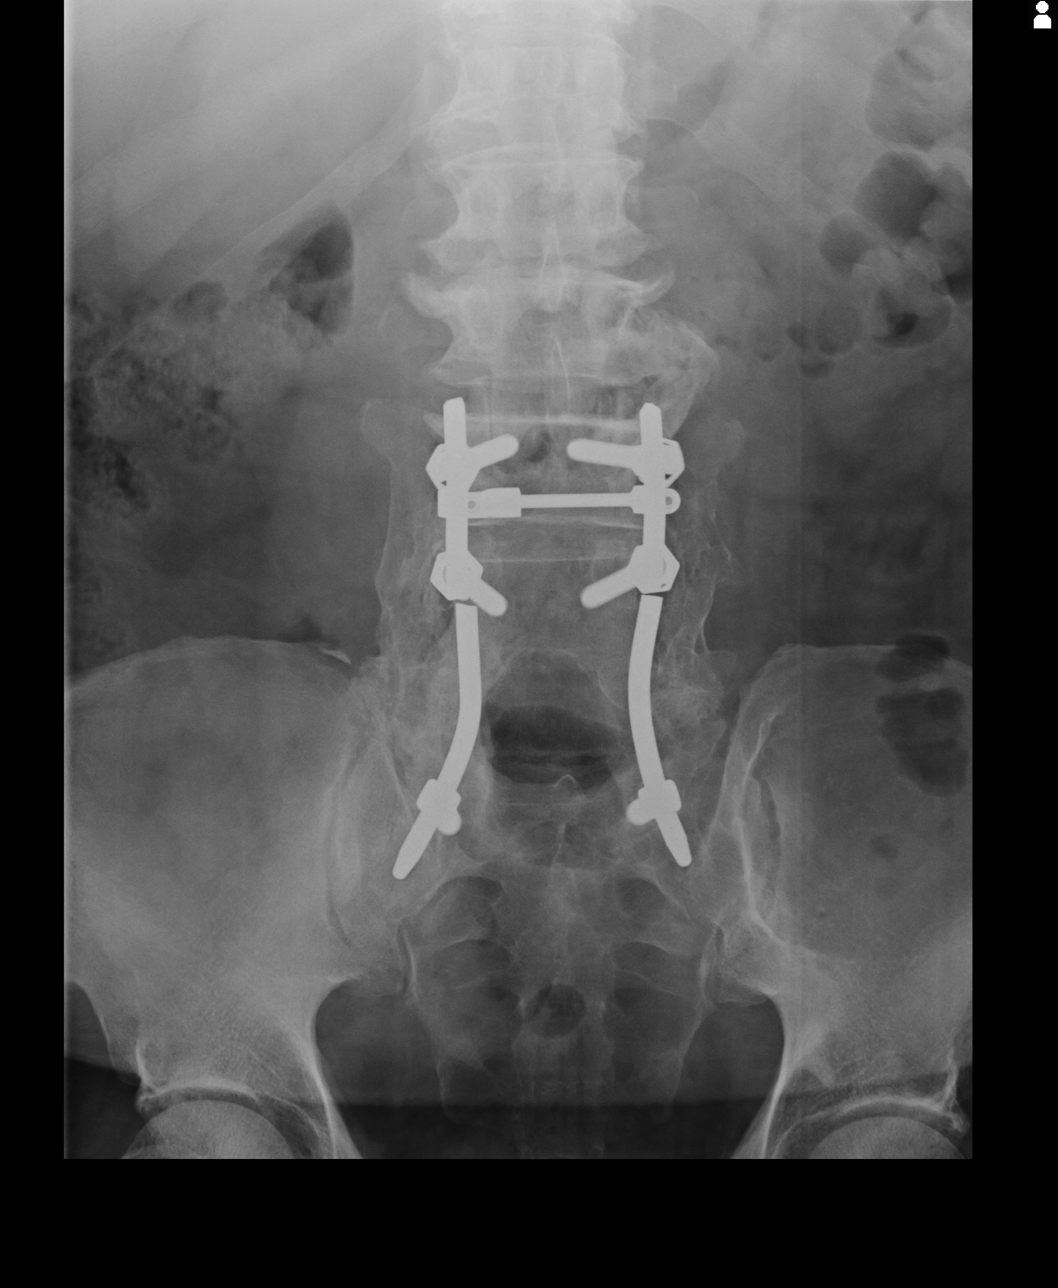
[im 2/3]
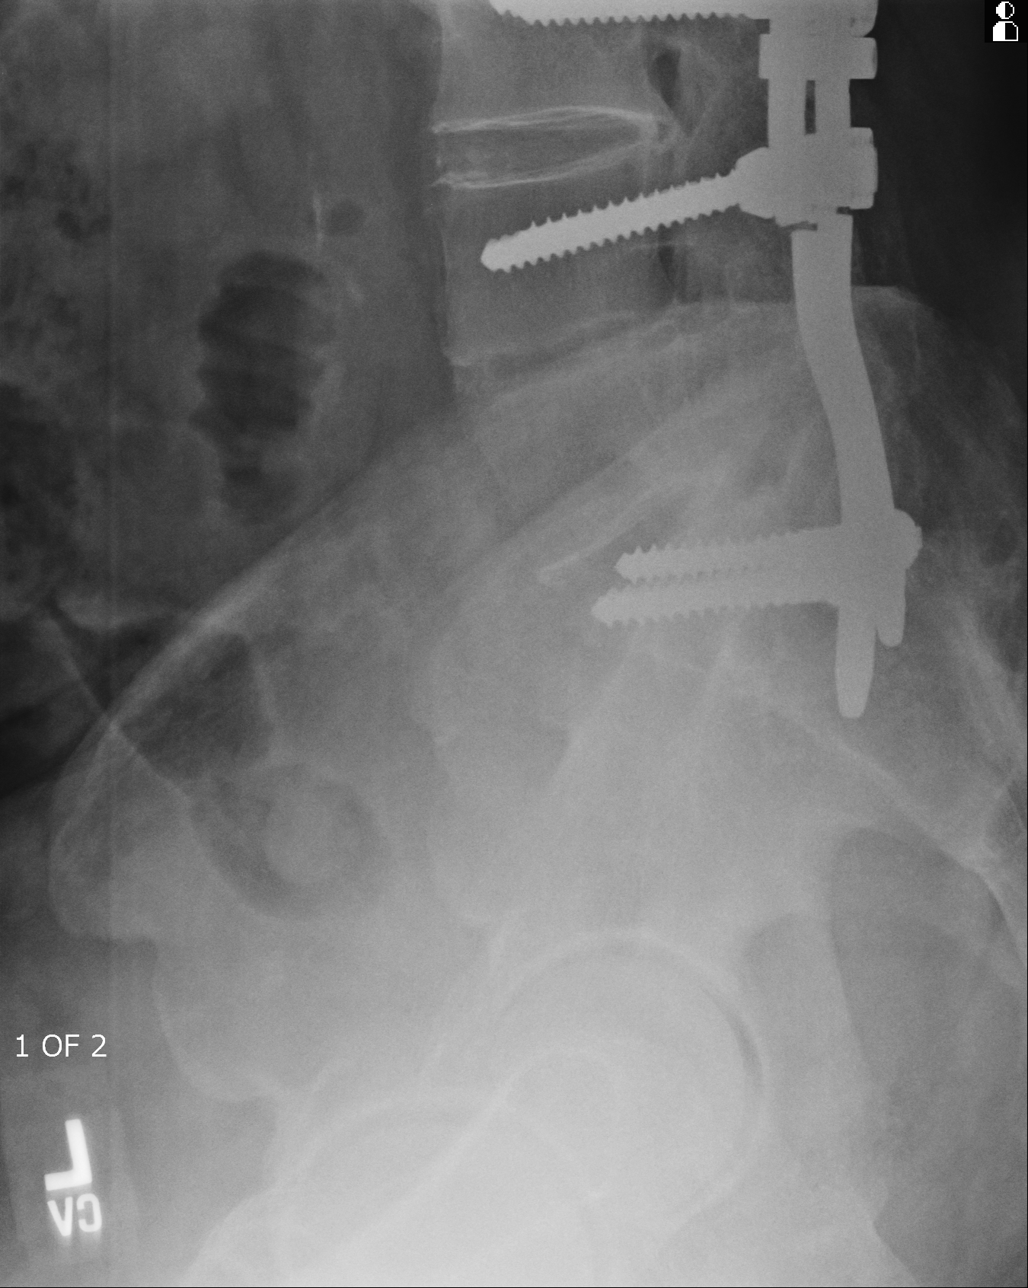
[im 3/3]
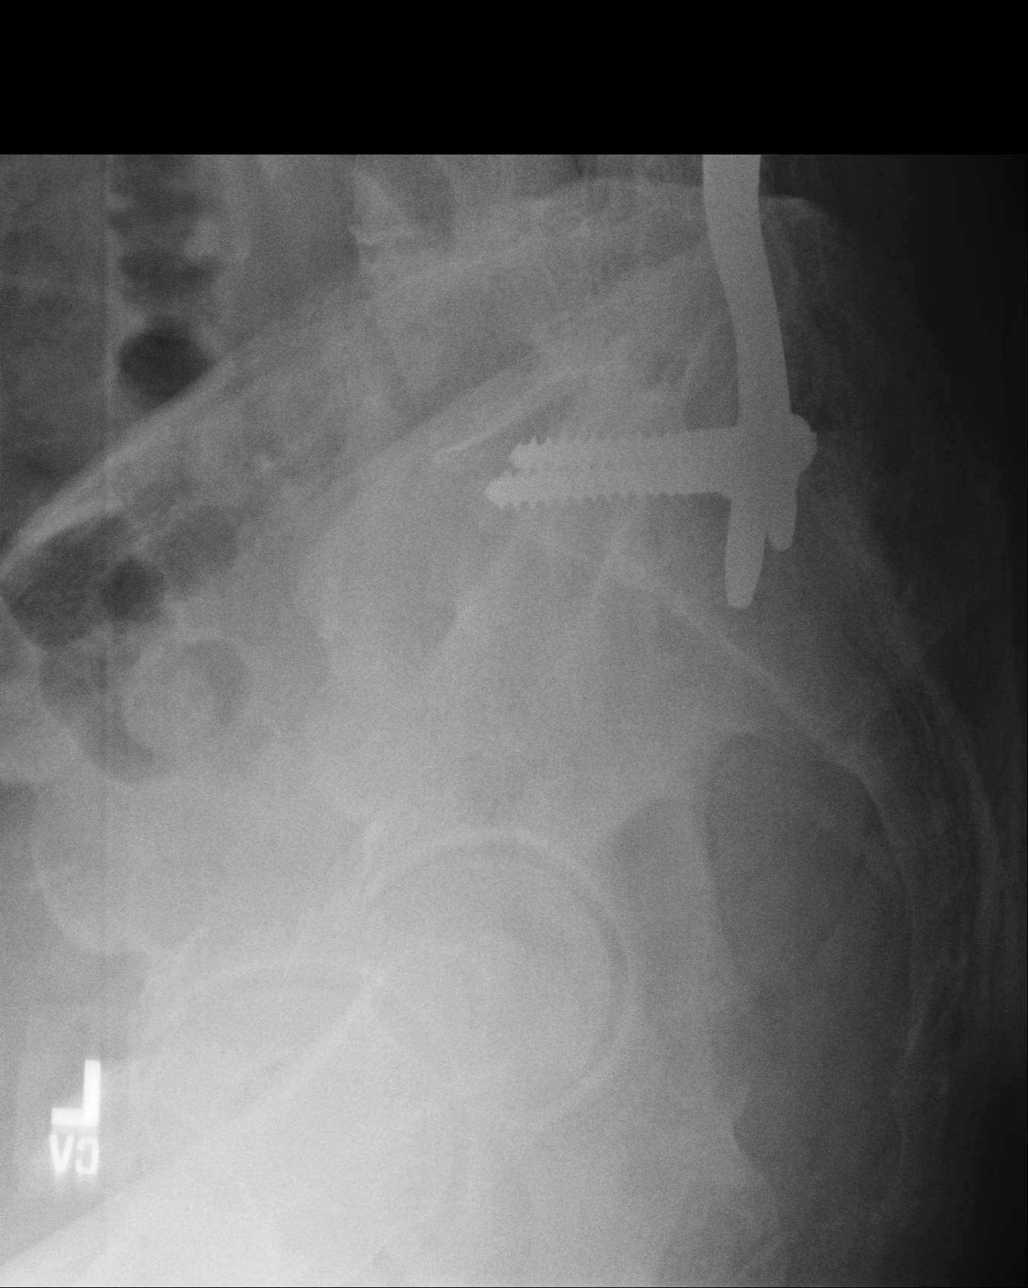

[4 of 4 positions shown; findings below may reference images not displayed]

FINDINGS: There are 5 nonrib bearing lumbar-type vertebral bodies. The
vertebral body heights are maintained. The alignment is anatomic.
There is no spondylolysis. There is no acute fracture or static
listhesis.

There is posterior spinal fusion with osseous fusion across the
posterior elements of L3-4, L4-5 and L5-S1. There is orthopedic
hardware transfixing the L3, L4 and S1 vertebral bodies. There are
bilateral pedicle screws at L3, L4 and S1. There is fracture of the
vertical rod just inferior to the L4 pedicle screw bilaterally.

The SI joints are unremarkable.
IMPRESSION: There are bilateral pedicle screws at L3, L4 and S1. There is
fracture of the vertical rod just inferior to the L4 pedicle screw
bilaterally.

No acute osseous injury of the lumbar spine.

## 2015-05-23 ENCOUNTER — Ambulatory Visit (INDEPENDENT_AMBULATORY_CARE_PROVIDER_SITE_OTHER): Payer: BLUE CROSS/BLUE SHIELD | Admitting: Family Medicine

## 2015-05-23 ENCOUNTER — Encounter: Payer: Self-pay | Admitting: Family Medicine

## 2015-05-23 VITALS — BP 122/64 | HR 91 | Temp 98.4°F | Resp 14 | Ht 72.0 in | Wt 270.0 lb

## 2015-05-23 DIAGNOSIS — M545 Low back pain: Secondary | ICD-10-CM

## 2015-05-23 DIAGNOSIS — G8929 Other chronic pain: Secondary | ICD-10-CM

## 2015-05-23 DIAGNOSIS — I10 Essential (primary) hypertension: Secondary | ICD-10-CM | POA: Diagnosis not present

## 2015-05-23 MED ORDER — OXYCODONE-ACETAMINOPHEN 10-325 MG PO TABS: 1.0000 | ORAL_TABLET | Freq: Three times a day (TID) | ORAL | Status: DC | PRN

## 2015-05-23 MED ORDER — OXYCODONE HCL ER 40 MG PO T12A: 40.0000 mg | EXTENDED_RELEASE_TABLET | Freq: Two times a day (BID) | ORAL | Status: DC

## 2015-05-23 MED ORDER — LISINOPRIL 20 MG PO TABS: 20.0000 mg | ORAL_TABLET | Freq: Every day | ORAL | Status: DC

## 2015-05-23 NOTE — Progress Notes (Signed)
Name: Lawrence Thornton.   MRN: [id number]    DOB: 1955-06-22   Date:05/23/2015       Progress Note  Subjective  Chief Complaint  Chief Complaint  Patient presents with  . Medication Refill  . Pain    back    Back Pain This is a chronic problem. The pain is present in the lumbar spine. The pain does not radiate. The pain is at a severity of 3/10. The symptoms are aggravated by bending and position. Pertinent negatives include no bladder incontinence, bowel incontinence, chest pain, fever or headaches. He has tried analgesics for the symptoms. The treatment provided significant relief.  Hypertension This is a chronic problem. The problem is unchanged. The problem is controlled. Pertinent negatives include no blurred vision, chest pain, headaches, palpitations or shortness of breath. Past treatments include ACE inhibitors. There is no history of kidney disease, CAD/MI or CVA.      Past Medical History  Diagnosis Date  . Hypertension   . Arthritis     oa    Past Surgical History  Procedure Laterality Date  . Back surgery  lower back surgery x 3     L 3 L 4 and L 5 are fused together  . Appendectomy  age 43  . Sebaceous cyst removed Right 15 years ago    middle  finger   . Toe fusion Right     4th toe  . Metal removed from eye  years ago  . 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 Joint replacement    . Total knee arthroplasty Right 12/21/2014    Procedure: RIGHT TOTAL KNEE ARTHROPLASTY;  Surgeon: Eugenia Mcalpine, MD;  Location: [facility];  Service: Orthopedics;  Laterality: Right;  . Total knee arthroplasty Right 12/21/14    Family History  Problem Relation Age of Onset  . Cancer Mother     Throat  . Hypertension Father   . Hyperlipidemia Father     Social History   Social History  . Marital Status: Married    Spouse Name: N/A  .  Number of Children: N/A  . Years of Education: N/A   Occupational History  . Not on file.   Social History Main Topics  . Smoking status: Former Smoker    Quit date: 04/07/1999  . Smokeless tobacco: Never Used  . Alcohol Use: No  . Drug Use: No  . Sexual Activity: Yes    Birth Control/ Protection: Condom   Other Topics Concern  . Not on file   Social History Narrative     Current outpatient prescriptions:  .  aspirin EC 325 MG tablet, Take 1 tablet (325 mg total) by mouth 2 (two) times daily. (Patient not taking: Reported on 02/22/2015), Disp: 60 tablet, Rfl: 0 .  doxazosin (CARDURA) 8 MG tablet, Take 1 tablet (8 mg total) by mouth at bedtime., Disp: 90 tablet, Rfl: 3 .  gemfibrozil (LOPID) 600 MG tablet, take 1 tablet by mouth twice a day before meals, Disp: 180 tablet, Rfl: 0 .  Linaclotide (LINZESS) 145 MCG CAPS capsule, Take 145 mcg by mouth as needed (constipation). , Disp: , Rfl:  .  lisinopril (PRINIVIL,ZESTRIL) 20 MG tablet, Take 1 tablet (20 mg total) by mouth at bedtime., Disp: 90 tablet, Rfl: 0 .  methocarbamol (ROBAXIN) 500 MG tablet, Take 1 tablet (500 mg total) by  mouth every 6 (six) hours as needed for muscle spasms. (Patient not taking: Reported on 04/22/2015), Disp: 50 tablet, Rfl: 2 .  oxyCODONE (OXY IR/ROXICODONE) 5 MG immediate release tablet, Take 1 tablet (5 mg total) by mouth every 4 (four) hours as needed for severe pain or breakthrough pain., Disp: 60 tablet, Rfl: 0 .  oxyCODONE (OXYCONTIN) 40 mg 12 hr tablet, Take 1 tablet (40 mg total) by mouth every 12 (twelve) hours., Disp: 60 tablet, Rfl: 0 .  oxyCODONE-acetaminophen (PERCOCET) 10-325 MG tablet, Take 1 tablet by mouth every 8 (eight) hours as needed for pain., Disp: 90 tablet, Rfl: 0  No Known Allergies   Review of Systems  Constitutional: Negative for fever.  Eyes: Negative for blurred vision.  Respiratory: Negative for shortness of breath.   Cardiovascular: Negative for chest pain and  palpitations.  Gastrointestinal: Negative for bowel incontinence.  Genitourinary: Negative for bladder incontinence.  Musculoskeletal: Positive for back pain.  Neurological: Negative for headaches.     Objective  Filed Vitals:   05/23/15 0909  BP: 122/64  Pulse: 91  Temp: 98.4 F (36.9 C)  TempSrc: Oral  Resp: 14  Height: 6' (1.829 m)  Weight: 270 lb (122.471 kg)  SpO2: 97%    Physical Exam  Constitutional: He is oriented to person, place, and time and well-developed, well-nourished, and in no distress.  HENT:  Head: Normocephalic and atraumatic.  Cardiovascular: Normal rate and regular rhythm.   No murmur heard. Pulmonary/Chest: Effort normal and breath sounds normal. No respiratory distress. He has no wheezes.  Musculoskeletal: He exhibits no edema.       Lumbar back: He exhibits tenderness, pain and spasm.       Back:  Neurological: He is alert and oriented to person, place, and time.  Psychiatric: Mood, memory, affect and judgment normal.  Nursing note and vitals reviewed.      Assessment & Plan  1. Chronic low back pain Table on daily opioid therapy. Patient compliant with controlled substances agreement. Understands the dependence potential and side effects of opioids. Refills provided - oxyCODONE (OXYCONTIN) 40 mg 12 hr tablet; Take 1 tablet (40 mg total) by mouth every 12 (twelve) hours.  Dispense: 60 tablet; Refill: 0 - oxyCODONE-acetaminophen (PERCOCET) 10-325 MG tablet; Take 1 tablet by mouth every 8 (eight) hours as needed for pain.  Dispense: 90 tablet; Refill: 0  2. Essential hypertension  - lisinopril (PRINIVIL,ZESTRIL) 20 MG tablet; Take 1 tablet (20 mg total) by mouth at bedtime.  Dispense: 90 tablet; Refill: 0   Keimya Briddell [facility]ad [facility] 05/23/2015 10:17 AM

## 2015-06-20 ENCOUNTER — Encounter: Payer: Self-pay | Admitting: Family Medicine

## 2015-06-20 ENCOUNTER — Ambulatory Visit (INDEPENDENT_AMBULATORY_CARE_PROVIDER_SITE_OTHER): Payer: BLUE CROSS/BLUE SHIELD | Admitting: Family Medicine

## 2015-06-20 VITALS — BP 120/62 | HR 88 | Temp 98.5°F | Resp 17 | Ht 72.0 in | Wt 268.2 lb

## 2015-06-20 DIAGNOSIS — M545 Low back pain: Secondary | ICD-10-CM | POA: Diagnosis not present

## 2015-06-20 DIAGNOSIS — G8929 Other chronic pain: Secondary | ICD-10-CM | POA: Diagnosis not present

## 2015-06-20 MED ORDER — OXYCODONE HCL ER 40 MG PO T12A: 40.0000 mg | EXTENDED_RELEASE_TABLET | Freq: Two times a day (BID) | ORAL | Status: DC

## 2015-06-20 MED ORDER — OXYCODONE-ACETAMINOPHEN 10-325 MG PO TABS: 1.0000 | ORAL_TABLET | Freq: Three times a day (TID) | ORAL | Status: DC | PRN

## 2015-06-20 NOTE — Progress Notes (Signed)
Name: Lawrence PaulsMarshall R Mathison Jr.   MRN: [id number]    DOB: 10-21-1955   Date:06/20/2015       Progress Note  Subjective  Chief Complaint  Chief Complaint  Patient presents with  . Follow-up    1 mo  . Medication Refill    oxtcodone 10/325 mg / oxtcontin 40 mg    Back Pain This is a chronic problem. The pain is present in the lumbar spine. The pain does not radiate. The pain is at a severity of 3/10. The symptoms are aggravated by bending and position. Pertinent negatives include no bladder incontinence, bowel incontinence or fever. He has tried analgesics for the symptoms. The treatment provided significant relief.      Past Medical History  Diagnosis Date  . Hypertension   . Arthritis     oa    Past Surgical History  Procedure Laterality Date  . Back surgery  lower back surgery x 3     L 3 L 4 and L 5 are fused together  . Appendectomy  age 60  . Sebaceous cyst removed Right 15 years ago    middle  finger   . Toe fusion Right     4th toe  . Metal removed from eye  years ago  . 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 Joint replacement    . Total knee arthroplasty Right 12/21/2014    Procedure: RIGHT TOTAL KNEE ARTHROPLASTY;  Surgeon: Eugenia Mcalpineobert Collins, MD;  Location: [facility];  Service: Orthopedics;  Laterality: Right;  . Total knee arthroplasty Right 12/21/14    Family History  Problem Relation Age of Onset  . Cancer Mother     Throat  . Hypertension Father   . Hyperlipidemia Father     Social History   Social History  . Marital Status: Married    Spouse Name: N/A  . Number of Children: N/A  . Years of Education: N/A   Occupational History  . Not on file.   Social History Main Topics  . Smoking status: Former Smoker    Quit date: 04/07/1999  . Smokeless tobacco: Never Used  . Alcohol Use: No  . Drug Use: No  . Sexual  Activity: Yes    Birth Control/ Protection: Condom   Other Topics Concern  . Not on file   Social History Narrative     Current outpatient prescriptions:  .  aspirin EC 325 MG tablet, Take 1 tablet (325 mg total) by mouth 2 (two) times daily., Disp: 60 tablet, Rfl: 0 .  doxazosin (CARDURA) 8 MG tablet, Take 1 tablet (8 mg total) by mouth at bedtime., Disp: 90 tablet, Rfl: 3 .  gemfibrozil (LOPID) 600 MG tablet, take 1 tablet by mouth twice a day before meals, Disp: 180 tablet, Rfl: 0 .  Linaclotide (LINZESS) 145 MCG CAPS capsule, Take 145 mcg by mouth as needed (constipation). , Disp: , Rfl:  .  lisinopril (PRINIVIL,ZESTRIL) 20 MG tablet, Take 1 tablet (20 mg total) by mouth at bedtime., Disp: 90 tablet, Rfl: 0 .  methocarbamol (ROBAXIN) 500 MG tablet, Take 1 tablet (500 mg total) by mouth every 6 (six) hours as needed for muscle spasms., Disp: 50 tablet, Rfl: 2 .  oxyCODONE (OXY IR/ROXICODONE) 5 MG immediate release tablet, Take 1 tablet (5 mg total) by mouth every 4 (four) hours as needed for severe pain  or breakthrough pain., Disp: 60 tablet, Rfl: 0 .  oxyCODONE (OXYCONTIN) 40 mg 12 hr tablet, Take 1 tablet (40 mg total) by mouth every 12 (twelve) hours., Disp: 60 tablet, Rfl: 0 .  oxyCODONE-acetaminophen (PERCOCET) 10-325 MG tablet, Take 1 tablet by mouth every 8 (eight) hours as needed for pain., Disp: 90 tablet, Rfl: 0  No Known Allergies   Review of Systems  Constitutional: Negative for fever.  Gastrointestinal: Negative for bowel incontinence.  Genitourinary: Negative for bladder incontinence.  Musculoskeletal: Positive for back pain.     Objective  Filed Vitals:   06/20/15 0938  BP: 120/62  Pulse: 88  Temp: 98.5 F (36.9 C)  TempSrc: Oral  Resp: 17  Height: 6' (1.829 m)  Weight: 268 lb 3.2 oz (121.655 kg)  SpO2: 98%    Physical Exam  Constitutional: He is oriented to person, place, and time and well-developed, well-nourished, and in no distress.  HENT:    Head: Normocephalic and atraumatic.  Cardiovascular: Normal rate and regular rhythm.   No murmur heard. Pulmonary/Chest: Effort normal and breath sounds normal. No respiratory distress. He has no wheezes.  Musculoskeletal: He exhibits no edema.       Lumbar back: He exhibits tenderness, pain and spasm.       Back:  Neurological: He is alert and oriented to person, place, and time.  Psychiatric: Mood, memory, affect and judgment normal.  Nursing note and vitals reviewed.     Assessment & Plan  1. Chronic low back pain Stable and responsive to opioid therapy. Patient compliant with controlled substances agreement and a new copy was signed today. Refills provided. - oxyCODONE (OXYCONTIN) 40 mg 12 hr tablet; Take 1 tablet (40 mg total) by mouth every 12 (twelve) hours.  Dispense: 60 tablet; Refill: 0 - oxyCODONE-acetaminophen (PERCOCET) 10-325 MG tablet; Take 1 tablet by mouth every 8 (eight) hours as needed for pain.  Dispense: 90 tablet; Refill: 0   Lawrence Thornton [facility] 06/20/2015 10:02 AM

## 2015-07-18 ENCOUNTER — Encounter: Payer: Self-pay | Admitting: Family Medicine

## 2015-07-18 ENCOUNTER — Ambulatory Visit (INDEPENDENT_AMBULATORY_CARE_PROVIDER_SITE_OTHER): Payer: BLUE CROSS/BLUE SHIELD | Admitting: Family Medicine

## 2015-07-18 VITALS — BP 130/68 | HR 92 | Temp 98.2°F | Resp 16 | Ht 72.0 in | Wt 269.0 lb

## 2015-07-18 DIAGNOSIS — T402X5A Adverse effect of other opioids, initial encounter: Secondary | ICD-10-CM | POA: Insufficient documentation

## 2015-07-18 DIAGNOSIS — G8929 Other chronic pain: Secondary | ICD-10-CM

## 2015-07-18 DIAGNOSIS — K5903 Drug induced constipation: Secondary | ICD-10-CM | POA: Diagnosis not present

## 2015-07-18 DIAGNOSIS — M545 Low back pain, unspecified: Secondary | ICD-10-CM

## 2015-07-18 MED ORDER — OXYCODONE HCL ER 40 MG PO T12A: 40.0000 mg | EXTENDED_RELEASE_TABLET | Freq: Two times a day (BID) | ORAL | Status: DC

## 2015-07-18 MED ORDER — LINACLOTIDE 145 MCG PO CAPS: 145.0000 ug | ORAL_CAPSULE | Freq: Every day | ORAL | Status: DC

## 2015-07-18 MED ORDER — OXYCODONE-ACETAMINOPHEN 10-325 MG PO TABS: 1.0000 | ORAL_TABLET | Freq: Three times a day (TID) | ORAL | Status: DC | PRN

## 2015-07-18 NOTE — Progress Notes (Signed)
Name: Lawrence PaulsMarshall R Koo Jr.   MRN: [id number]    DOB: [birth year]   Date:07/18/2015       Progress Note  Subjective  Chief Complaint  Chief Complaint  Patient presents with  . Follow-up    1 month F/U, Needs refill  . Pain    Chronic back pain constant pain  . Constipation    Refills goes to bathroom daily but will take if he doesn't go for a day, takes PRN.    HPI  Chronic Low Back Pain: Pt. Presents for medication refill of Oxycontin 40 mg every 12 hours and Percocet 10-325 mg every 8 hours as needed. Chronic low back pain rated at 3/10, pain relieved with medications.   Opioid Induced Constipation: Pt. Requesting refill for Linzess, takes it twice a week for opioid-induced constipation. Normally has a bowel movement every day.   Past Medical History  Diagnosis Date  . Hypertension   . Arthritis     oa    Past Surgical History  Procedure Laterality Date  . Back surgery  lower back surgery x 3     L 3 L 4 and L 5 are fused together  . Appendectomy  age 60  . Sebaceous cyst removed Right 15 years ago    middle  finger   . Toe fusion Right     4th toe  . Metal removed from eye  years ago  . 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 Joint replacement    . Total knee arthroplasty Right 12/21/2014    Procedure: RIGHT TOTAL KNEE ARTHROPLASTY;  Surgeon: Eugenia Mcalpineobert Collins, MD;  Location: [facility];  Service: Orthopedics;  Laterality: Right;  . Total knee arthroplasty Right 12/21/14    Family History  Problem Relation Age of Onset  . Cancer Mother     Throat  . Hypertension Father   . Hyperlipidemia Father     Social History   Social History  . Marital Status: Married    Spouse Name: N/A  . Number of Children: N/A  . Years of Education: N/A   Occupational History  . Not on file.   Social History Main Topics  . Smoking status:  Former Smoker    Quit date: 04/07/1999  . Smokeless tobacco: Never Used  . Alcohol Use: No  . Drug Use: No  . Sexual Activity: Yes    Birth Control/ Protection: Condom   Other Topics Concern  . Not on file   Social History Narrative     Current outpatient prescriptions:  .  doxazosin (CARDURA) 8 MG tablet, Take 1 tablet (8 mg total) by mouth at bedtime., Disp: 90 tablet, Rfl: 3 .  gemfibrozil (LOPID) 600 MG tablet, take 1 tablet by mouth twice a day before meals, Disp: 180 tablet, Rfl: 0 .  Linaclotide (LINZESS) 145 MCG CAPS capsule, Take 145 mcg by mouth as needed (constipation). , Disp: , Rfl:  .  lisinopril (PRINIVIL,ZESTRIL) 20 MG tablet, Take 1 tablet (20 mg total) by mouth at bedtime., Disp: 90 tablet, Rfl: 0 .  oxyCODONE (OXYCONTIN) 40 mg 12 hr tablet, Take 1 tablet (40 mg total) by mouth every 12 (twelve) hours., Disp: 60 tablet, Rfl: 0 .  oxyCODONE-acetaminophen (PERCOCET) 10-325 MG tablet, Take 1 tablet by mouth every 8 (eight) hours as needed for pain., Disp: 90 tablet, Rfl: 0 .  aspirin EC  325 MG tablet, Take 1 tablet (325 mg total) by mouth 2 (two) times daily. (Patient not taking: Reported on 07/18/2015), Disp: 60 tablet, Rfl: 0 .  methocarbamol (ROBAXIN) 500 MG tablet, Take 1 tablet (500 mg total) by mouth every 6 (six) hours as needed for muscle spasms. (Patient not taking: Reported on 07/18/2015), Disp: 50 tablet, Rfl: 2 .  oxyCODONE (OXY IR/ROXICODONE) 5 MG immediate release tablet, Take 1 tablet (5 mg total) by mouth every 4 (four) hours as needed for severe pain or breakthrough pain. (Patient not taking: Reported on 07/18/2015), Disp: 60 tablet, Rfl: 0  No Known Allergies   Review of Systems  Gastrointestinal: Positive for constipation. Negative for abdominal pain and diarrhea.  Musculoskeletal: Positive for back pain.      Objective  Filed Vitals:   07/18/15 1529  BP: 130/68  Pulse: 92  Temp: 98.2 F (36.8 C)  TempSrc: Oral  Resp: 16  Height: 6' (1.829  m)  Weight: 269 lb (122.018 kg)  SpO2: 96%    Physical Exam  Constitutional: He is oriented to person, place, and time and well-developed, well-nourished, and in no distress.  Cardiovascular: Normal rate and regular rhythm.   Pulmonary/Chest: Effort normal and breath sounds normal.  Abdominal: Soft. Bowel sounds are normal.  Musculoskeletal:       Lumbar back: He exhibits tenderness.       Back:  Neurological: He is alert and oriented to person, place, and time.  Psychiatric: Mood, memory, affect and judgment normal.  Nursing note and vitals reviewed.      Assessment & Plan  1. Chronic low back pain Stable and responsive to opioid therapy, obtain urine drug screen as part of controlled substances agreement. - oxyCODONE (OXYCONTIN) 40 mg 12 hr tablet; Take 1 tablet (40 mg total) by mouth every 12 (twelve) hours.  Dispense: 60 tablet; Refill: 0 - oxyCODONE-acetaminophen (PERCOCET) 10-325 MG tablet; Take 1 tablet by mouth every 8 (eight) hours as needed for pain.  Dispense: 90 tablet; Refill: 0 - Drug Screen 12+Alcohol+CRT, Ur  2. Constipation due to opioid therapy  - Linaclotide (LINZESS) 145 MCG CAPS capsule; Take 1 capsule (145 mcg total) by mouth daily.  Dispense: 30 capsule; Refill: 0   Berthold Glace Asad [facility] 07/18/2015 3:54 PM

## 2015-07-31 LAB — DRUG SCREEN 12+ALCOHOL+CRT, UR
AMPHETAMINES, URINE: NEGATIVE ng/mL
BARBITURATE: NEGATIVE ng/mL
BENZODIAZ UR QL: NEGATIVE ng/mL
CANNABINOIDS: NEGATIVE ng/mL
COCAINE (METABOLITE): NEGATIVE ng/mL
Creatinine, Urine: 179.1 mg/dL (ref 20.0–300.0)
Ethanol U, Quan: NEGATIVE %
MEPERIDINE: NEGATIVE ng/mL
Methadone: NEGATIVE ng/mL
OPIATE SCREEN URINE: NEGATIVE ng/mL
Oxycodone/Oxymorphone, Urine: POSITIVE — AB
Phencyclidine: NEGATIVE ng/mL
Propoxyphene: NEGATIVE ng/mL
Tramadol: NEGATIVE ng/mL

## 2015-08-16 ENCOUNTER — Encounter: Payer: Self-pay | Admitting: Family Medicine

## 2015-08-16 ENCOUNTER — Ambulatory Visit (INDEPENDENT_AMBULATORY_CARE_PROVIDER_SITE_OTHER): Payer: BLUE CROSS/BLUE SHIELD | Admitting: Family Medicine

## 2015-08-16 VITALS — BP 110/70 | HR 89 | Temp 98.5°F | Resp 18 | Ht 72.0 in | Wt 270.4 lb

## 2015-08-16 DIAGNOSIS — G8929 Other chronic pain: Secondary | ICD-10-CM

## 2015-08-16 DIAGNOSIS — M545 Low back pain: Secondary | ICD-10-CM

## 2015-08-16 MED ORDER — OXYCODONE-ACETAMINOPHEN 10-325 MG PO TABS: 1.0000 | ORAL_TABLET | Freq: Three times a day (TID) | ORAL | Status: DC | PRN

## 2015-08-16 MED ORDER — OXYCODONE HCL ER 40 MG PO T12A: 40.0000 mg | EXTENDED_RELEASE_TABLET | Freq: Two times a day (BID) | ORAL | Status: DC

## 2015-08-16 NOTE — Progress Notes (Signed)
Name: Lawrence Thornton.   MRN: [id number]    DOB: [birth year]   Date:08/16/2015       Progress Note  Subjective  Chief Complaint  Chief Complaint  Patient presents with  . Pain    med refill    Back Pain This is a chronic problem. The problem is unchanged. The pain is present in the lumbar spine. The pain does not radiate. The pain is at a severity of 3/10. The symptoms are aggravated by bending and position. Pertinent negatives include no bladder incontinence, bowel incontinence or fever. He has tried analgesics for the symptoms. The treatment provided significant relief.    Past Medical History  Diagnosis Date  . Hypertension   . Arthritis     oa    Past Surgical History  Procedure Laterality Date  . Back surgery  lower back surgery x 3     L 3 L 4 and L 5 are fused together  . Appendectomy  age 29  . Sebaceous cyst removed Right 15 years ago    middle  finger   . Toe fusion Right     4th toe  . Metal removed from eye  years ago  . 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 Joint replacement    . Total knee arthroplasty Right 12/21/2014    Procedure: RIGHT TOTAL KNEE ARTHROPLASTY;  Surgeon: Eugenia Mcalpine, MD;  Location: [facility];  Service: Orthopedics;  Laterality: Right;  . Total knee arthroplasty Right 12/21/14    Family History  Problem Relation Age of Onset  . Cancer Mother     Throat  . Hypertension Father   . Hyperlipidemia Father     Social History   Social History  . Marital Status: Married    Spouse Name: N/A  . Number of Children: N/A  . Years of Education: N/A   Occupational History  . Not on file.   Social History Main Topics  . Smoking status: Former Smoker    Quit date: 04/07/1999  . Smokeless tobacco: Never Used  . Alcohol Use: No  . Drug Use: No  . Sexual Activity: Yes    Birth Control/ Protection:  Condom   Other Topics Concern  . Not on file   Social History Narrative     Current outpatient prescriptions:  .  aspirin EC 325 MG tablet, Take 1 tablet (325 mg total) by mouth 2 (two) times daily. (Patient not taking: Reported on 07/18/2015), Disp: 60 tablet, Rfl: 0 .  doxazosin (CARDURA) 8 MG tablet, Take 1 tablet (8 mg total) by mouth at bedtime., Disp: 90 tablet, Rfl: 3 .  gemfibrozil (LOPID) 600 MG tablet, take 1 tablet by mouth twice a day before meals, Disp: 180 tablet, Rfl: 0 .  Linaclotide (LINZESS) 145 MCG CAPS capsule, Take 1 capsule (145 mcg total) by mouth daily., Disp: 30 capsule, Rfl: 0 .  lisinopril (PRINIVIL,ZESTRIL) 20 MG tablet, Take 1 tablet (20 mg total) by mouth at bedtime., Disp: 90 tablet, Rfl: 0 .  methocarbamol (ROBAXIN) 500 MG tablet, Take 1 tablet (500 mg total) by mouth every 6 (six) hours as needed for muscle spasms. (Patient not taking: Reported on 07/18/2015), Disp: 50 tablet, Rfl: 2 .  oxyCODONE (OXY IR/ROXICODONE) 5 MG immediate release tablet, Take 1 tablet (5 mg total) by mouth every 4 (four) hours as needed for severe  pain or breakthrough pain. (Patient not taking: Reported on 07/18/2015), Disp: 60 tablet, Rfl: 0 .  oxyCODONE (OXYCONTIN) 40 mg 12 hr tablet, Take 1 tablet (40 mg total) by mouth every 12 (twelve) hours., Disp: 60 tablet, Rfl: 0 .  oxyCODONE-acetaminophen (PERCOCET) 10-325 MG tablet, Take 1 tablet by mouth every 8 (eight) hours as needed for pain., Disp: 90 tablet, Rfl: 0  No Known Allergies   Review of Systems  Constitutional: Negative for fever.  Gastrointestinal: Negative for bowel incontinence.  Genitourinary: Negative for bladder incontinence.  Musculoskeletal: Positive for back pain.     Objective  Filed Vitals:   08/16/15 0909  BP: 110/70  Pulse: 89  Temp: 98.5 F (36.9 C)  TempSrc: Oral  Resp: 18  Height: 6' (1.829 m)  Weight: 270 lb 6.4 oz (122.653 kg)  SpO2: 97%    Physical Exam  Constitutional: He is oriented  to person, place, and time and well-developed, well-nourished, and in no distress.  HENT:  Head: Normocephalic and atraumatic.  Cardiovascular: Normal rate and regular rhythm.   No murmur heard. Pulmonary/Chest: Effort normal and breath sounds normal. No respiratory distress. He has no wheezes.  Musculoskeletal: He exhibits no edema.       Lumbar back: He exhibits tenderness, pain and spasm.       Back:  Neurological: He is alert and oriented to person, place, and time.  Psychiatric: Mood, memory, affect and judgment normal.  Nursing note and vitals reviewed.      Assessment & Plan  1. Chronic low back pain Stable and responsive to opioid therapy, consistent UDS last month. Refills provided and follow-up in one month. - oxyCODONE (OXYCONTIN) 40 mg 12 hr tablet; Take 1 tablet (40 mg total) by mouth every 12 (twelve) hours.  Dispense: 60 tablet; Refill: 0 - oxyCODONE-acetaminophen (PERCOCET) 10-325 MG tablet; Take 1 tablet by mouth every 8 (eight) hours as needed for pain.  Dispense: 90 tablet; Refill: 0   Lawrence Thornton Asad [facility] 08/16/2015 9:52 AM

## 2015-09-07 ENCOUNTER — Other Ambulatory Visit: Payer: Self-pay | Admitting: Family Medicine

## 2015-09-16 ENCOUNTER — Ambulatory Visit (INDEPENDENT_AMBULATORY_CARE_PROVIDER_SITE_OTHER): Payer: BLUE CROSS/BLUE SHIELD | Admitting: Family Medicine

## 2015-09-16 ENCOUNTER — Encounter: Payer: Self-pay | Admitting: Family Medicine

## 2015-09-16 VITALS — BP 124/72 | HR 86 | Temp 98.8°F | Resp 16 | Ht 72.0 in | Wt 275.5 lb

## 2015-09-16 DIAGNOSIS — G8929 Other chronic pain: Secondary | ICD-10-CM

## 2015-09-16 DIAGNOSIS — M545 Low back pain: Secondary | ICD-10-CM | POA: Diagnosis not present

## 2015-09-16 DIAGNOSIS — I10 Essential (primary) hypertension: Secondary | ICD-10-CM

## 2015-09-16 MED ORDER — OXYCODONE HCL ER 40 MG PO T12A
40.0000 mg | EXTENDED_RELEASE_TABLET | Freq: Two times a day (BID) | ORAL | Status: DC
Start: 2015-09-16 — End: 2015-10-10

## 2015-09-16 MED ORDER — OXYCODONE HCL ER 40 MG PO T12A: 40.0000 mg | EXTENDED_RELEASE_TABLET | Freq: Two times a day (BID) | ORAL | Status: DC

## 2015-09-16 MED ORDER — LISINOPRIL 20 MG PO TABS
20.0000 mg | ORAL_TABLET | Freq: Every day | ORAL | Status: DC
Start: 2015-09-16 — End: 2015-12-10

## 2015-09-16 MED ORDER — OXYCODONE-ACETAMINOPHEN 10-325 MG PO TABS: 1.0000 | ORAL_TABLET | Freq: Three times a day (TID) | ORAL | Status: DC | PRN

## 2015-09-16 NOTE — Progress Notes (Signed)
Name: Lawrence Thornton.   MRN: [id number]    DOB: February 27, 1956   Date:09/16/2015       Progress Note  Subjective  Chief Complaint  Chief Complaint  Patient presents with  . Pain    medication refills    Back Pain This is a chronic problem. The problem has been gradually worsening since onset. The pain is present in the lumbar spine. The pain does not radiate. The pain is at a severity of 5/10. The pain is moderate. The pain is worse during the night. The symptoms are aggravated by bending, position and lying down. Pertinent negatives include no bladder incontinence, bowel incontinence, chest pain, fever, headaches or weakness. Risk factors include obesity. He has tried analgesics for the symptoms. The treatment provided significant relief.  Hypertension This is a chronic problem. The problem is unchanged. Pertinent negatives include no blurred vision, chest pain, headaches or palpitations. Past treatments include ACE inhibitors. There is no history of kidney disease, CAD/MI or CVA.     Past Medical History  Diagnosis Date  . Hypertension   . Arthritis     oa    Past Surgical History  Procedure Laterality Date  . Back surgery  lower back surgery x 3     L 3 L 4 and L 5 are fused together  . Appendectomy  age 30  . Sebaceous cyst removed Right 15 years ago    middle  finger   . Toe fusion Right     4th toe  . Metal removed from eye  years ago  . 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 Joint replacement    . Total knee arthroplasty Right 12/21/2014    Procedure: RIGHT TOTAL KNEE ARTHROPLASTY;  Surgeon: Eugenia Mcalpine, MD;  Location: [facility];  Service: Orthopedics;  Laterality: Right;  . Total knee arthroplasty Right 12/21/14    Family History  Problem Relation Age of Onset  . Cancer Mother     Throat  . Hypertension Father   .  Hyperlipidemia Father     Social History   Social History  . Marital Status: Married    Spouse Name: N/A  . Number of Children: N/A  . Years of Education: N/A   Occupational History  . Not on file.   Social History Main Topics  . Smoking status: Former Smoker    Quit date: 04/07/1999  . Smokeless tobacco: Never Used  . Alcohol Use: No  . Drug Use: No  . Sexual Activity: Yes    Birth Control/ Protection: Condom   Other Topics Concern  . Not on file   Social History Narrative     Current outpatient prescriptions:  .  doxazosin (CARDURA) 8 MG tablet, Take 1 tablet (8 mg total) by mouth at bedtime., Disp: 90 tablet, Rfl: 3 .  gemfibrozil (LOPID) 600 MG tablet, take 1 tablet by mouth twice a day before meals, Disp: 180 tablet, Rfl: 0 .  Linaclotide (LINZESS) 145 MCG CAPS capsule, Take 1 capsule (145 mcg total) by mouth daily., Disp: 30 capsule, Rfl: 0 .  lisinopril (PRINIVIL,ZESTRIL) 20 MG tablet, Take 1 tablet (20 mg total) by mouth at bedtime., Disp: 90 tablet, Rfl: 0 .  oxyCODONE (OXYCONTIN) 40 mg 12 hr tablet, Take 1 tablet (40 mg total) by mouth every 12 (twelve) hours., Disp: 60 tablet, Rfl: 0 .  oxyCODONE-acetaminophen (PERCOCET) 10-325 MG tablet, Take 1 tablet by mouth every 8 (eight) hours as needed for pain., Disp: 90 tablet, Rfl: 0  No Known Allergies   Review of Systems  Constitutional: Negative for fever.  Eyes: Negative for blurred vision.  Cardiovascular: Negative for chest pain and palpitations.  Gastrointestinal: Negative for bowel incontinence.  Genitourinary: Negative for bladder incontinence.  Musculoskeletal: Positive for back pain.  Neurological: Negative for weakness and headaches.    Objective  Filed Vitals:   09/16/15 0912  BP: 124/72  Pulse: 86  Temp: 98.8 F (37.1 C)  TempSrc: Oral  Resp: 16  Height: 6' (1.829 m)  Weight: 275 lb 8 oz (124.966 kg)  SpO2: 97%    Physical Exam  Constitutional: He is oriented to person, place, and  time and well-developed, well-nourished, and in no distress.  HENT:  Head: Normocephalic and atraumatic.  Cardiovascular: Normal rate, regular rhythm and normal heart sounds.   No murmur heard. Pulmonary/Chest: Effort normal and breath sounds normal. He has no wheezes.  Musculoskeletal:       Lumbar back: He exhibits tenderness, pain and spasm.       Back:  SLR negative on right side.  Neurological: He is alert and oriented to person, place, and time.  Psychiatric: Mood, memory, affect and judgment normal.  Nursing note and vitals reviewed.     Assessment & Plan  1. Chronic low back pain Acutely worse, patient has history of lower back surgery and will contact orthopedics. Continue on opioid therapy as prescribed. - oxyCODONE (OXYCONTIN) 40 mg 12 hr tablet; Take 1 tablet (40 mg total) by mouth every 12 (twelve) hours.  Dispense: 60 tablet; Refill: 0 - oxyCODONE-acetaminophen (PERCOCET) 10-325 MG tablet; Take 1 tablet by mouth every 8 (eight) hours as needed for pain.  Dispense: 90 tablet; Refill: 0  2. Essential hypertension Pressures stable and controlled on present antihypertensive therapy - lisinopril (PRINIVIL,ZESTRIL) 20 MG tablet; Take 1 tablet (20 mg total) by mouth at bedtime.  Dispense: 90 tablet; Refill: 0   Kimani Hovis Asad A. Faylene KurtzShah [facility] 09/16/2015 9:47 AM

## 2015-10-10 ENCOUNTER — Encounter: Payer: Self-pay | Admitting: Family Medicine

## 2015-10-10 ENCOUNTER — Ambulatory Visit (INDEPENDENT_AMBULATORY_CARE_PROVIDER_SITE_OTHER): Payer: BLUE CROSS/BLUE SHIELD | Admitting: Family Medicine

## 2015-10-10 VITALS — BP 126/73 | HR 87 | Temp 98.6°F | Resp 17 | Ht 72.0 in | Wt 270.2 lb

## 2015-10-10 DIAGNOSIS — E785 Hyperlipidemia, unspecified: Secondary | ICD-10-CM | POA: Diagnosis not present

## 2015-10-10 DIAGNOSIS — R079 Chest pain, unspecified: Secondary | ICD-10-CM

## 2015-10-10 DIAGNOSIS — M545 Low back pain, unspecified: Secondary | ICD-10-CM

## 2015-10-10 DIAGNOSIS — G8929 Other chronic pain: Secondary | ICD-10-CM

## 2015-10-10 DIAGNOSIS — N401 Enlarged prostate with lower urinary tract symptoms: Secondary | ICD-10-CM | POA: Diagnosis not present

## 2015-10-10 DIAGNOSIS — R351 Nocturia: Secondary | ICD-10-CM

## 2015-10-10 MED ORDER — OXYCODONE HCL ER 40 MG PO T12A: 40.0000 mg | EXTENDED_RELEASE_TABLET | Freq: Two times a day (BID) | ORAL | Status: DC

## 2015-10-10 MED ORDER — DOXAZOSIN MESYLATE 8 MG PO TABS: 8.0000 mg | ORAL_TABLET | Freq: Every day | ORAL | Status: DC

## 2015-10-10 MED ORDER — OXYCODONE-ACETAMINOPHEN 10-325 MG PO TABS: 1.0000 | ORAL_TABLET | Freq: Three times a day (TID) | ORAL | Status: DC | PRN

## 2015-10-10 MED ORDER — GEMFIBROZIL 600 MG PO TABS: ORAL_TABLET | ORAL | Status: DC
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
## 2015-11-11 ENCOUNTER — Ambulatory Visit (INDEPENDENT_AMBULATORY_CARE_PROVIDER_SITE_OTHER): Payer: BLUE CROSS/BLUE SHIELD | Admitting: Family Medicine

## 2015-11-11 ENCOUNTER — Encounter: Payer: Self-pay | Admitting: Family Medicine

## 2015-11-11 DIAGNOSIS — G8929 Other chronic pain: Secondary | ICD-10-CM | POA: Diagnosis not present

## 2015-11-11 DIAGNOSIS — M545 Low back pain: Secondary | ICD-10-CM | POA: Diagnosis not present

## 2015-11-11 MED ORDER — OXYCODONE-ACETAMINOPHEN 10-325 MG PO TABS: 1.0000 | ORAL_TABLET | Freq: Three times a day (TID) | ORAL | 0 refills | Status: DC | PRN

## 2015-11-11 MED ORDER — OXYCODONE HCL ER 40 MG PO T12A: 40.0000 mg | EXTENDED_RELEASE_TABLET | Freq: Two times a day (BID) | ORAL | 0 refills | Status: DC

## 2015-11-11 NOTE — Progress Notes (Signed)
Name: Lawrence Thornton.   MRN: [id number]    DOB: 02-Mar-1956   Date:11/11/2015       Progress Note  Subjective  Chief Complaint  Chief Complaint  Patient presents with  . Follow-up    1 mo  . Medication Refill    Back Pain  This is a chronic problem. The problem has been gradually worsening (especially worse at night) since onset. The pain is present in the lumbar spine. The pain does not radiate. The pain is at a severity of 3/10. The pain is moderate. The pain is worse during the night. The symptoms are aggravated by bending, lying down and position. Pertinent negatives include no bladder incontinence, bowel incontinence, fever or weakness. Risk factors include obesity. He has tried analgesics for the symptoms. The treatment provided significant relief.    Past Medical History:  Diagnosis Date  . Arthritis    oa  . Hypertension     Past Surgical History:  Procedure Laterality Date  . APPENDECTOMY  age 67  . BACK SURGERY  lower back surgery x 3    L 3 L 4 and L 5 are fused together  . JOINT REPLACEMENT    . metal removed from eye  years ago  . sebaceous cyst removed Right 15 years ago   middle  finger   . TOE FUSION Right    4th toe  . 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 TOTAL KNEE ARTHROPLASTY Right 12/21/2014   Procedure: RIGHT TOTAL KNEE ARTHROPLASTY;  Surgeon: Eugenia Mcalpine, MD;  Location: [facility];  Service: Orthopedics;  Laterality: Right;  . TOTAL KNEE ARTHROPLASTY Right 12/21/14    Family History  Problem Relation Age of Onset  . Cancer Mother     Throat  . Hypertension Father   . Hyperlipidemia Father     Social History   Social History  . Marital status: Married    Spouse name: N/A  . Number of children: N/A  . Years of education: N/A   Occupational History  . Not on file.   Social History Main Topics  .  Smoking status: Former Smoker    Quit date: 04/07/1999  . Smokeless tobacco: Never Used  . Alcohol use No  . Drug use: No  . Sexual activity: Yes    Birth control/ protection: Condom   Other Topics Concern  . Not on file   Social History Narrative  . No narrative on file     Current Outpatient Prescriptions:  .  gemfibrozil (LOPID) 600 MG tablet, take 1 tablet by mouth twice a day before meals, Disp: 180 tablet, Rfl: 0 .  Linaclotide (LINZESS) 145 MCG CAPS capsule, Take 1 capsule (145 mcg total) by mouth daily., Disp: 30 capsule, Rfl: 0 .  lisinopril (PRINIVIL,ZESTRIL) 20 MG tablet, Take 1 tablet (20 mg total) by mouth at bedtime., Disp: 90 tablet, Rfl: 0 .  oxyCODONE (OXYCONTIN) 40 mg 12 hr tablet, Take 1 tablet (40 mg total) by mouth every 12 (twelve) hours., Disp: 60 tablet, Rfl: 0 .  oxyCODONE-acetaminophen (PERCOCET) 10-325 MG tablet, Take 1 tablet by mouth every 8 (eight) hours as needed for pain., Disp: 90 tablet, Rfl: 0 .  doxazosin (CARDURA) 8 MG tablet, Take 1 tablet (8 mg total) by mouth at bedtime. (Patient not taking: Reported on 11/11/2015), Disp: 90 tablet, Rfl: 3 .  predniSONE (  DELTASONE) 10 MG tablet, take tablets by mouth daily FOLLOWING INSTRUCTION SHEET GIVING, Disp: , Rfl: 0  No Known Allergies   Review of Systems  Constitutional: Negative for fever.  Gastrointestinal: Negative for bowel incontinence.  Genitourinary: Negative for bladder incontinence.  Musculoskeletal: Positive for back pain.  Neurological: Negative for weakness.      Objective  Vitals:   11/11/15 1036  BP: 123/62  Pulse: 100  Resp: 17  Temp: 98.8 F (37.1 C)  TempSrc: Oral  SpO2: 97%  Weight: 274 lb 9.6 oz (124.6 kg)  Height: 6' (1.829 m)    Physical Exam  Constitutional: He is oriented to person, place, and time and well-developed, well-nourished, and in no distress.  Cardiovascular: Normal rate, regular rhythm and normal heart sounds.   No murmur heard. Pulmonary/Chest:  Effort normal and breath sounds normal. He has no wheezes.  Musculoskeletal:       Lumbar back: He exhibits tenderness and pain. He exhibits no spasm.       Back:  SLR test negative on left side.  Neurological: He is alert and oriented to person, place, and time.  Nursing note and vitals reviewed.     Assessment & Plan  1. Chronic low back pain Worsening chronic low back pain, continue on opioid therapy but will also referred to pain management for better pain control. Patient also plans on seeing the back specialist who performed his back surgery. Refills provided. - oxyCODONE (OXYCONTIN) 40 mg 12 hr tablet; Take 1 tablet (40 mg total) by mouth every 12 (twelve) hours.  Dispense: 60 tablet; Refill: 0 - oxyCODONE-acetaminophen (PERCOCET) 10-325 MG tablet; Take 1 tablet by mouth every 8 (eight) hours as needed for pain.  Dispense: 90 tablet; Refill: 0 - Ambulatory referral to Pain Clinic   [facility] Asad A. Faylene KurtzShah [facility] 11/11/2015 10:46 AM

## 2015-11-12 LAB — COMPREHENSIVE METABOLIC PANEL
A/G RATIO: 1.5 (ref 1.2–2.2)
ALK PHOS: 76 IU/L (ref 39–117)
ALT: 27 IU/L (ref 0–44)
AST: 18 IU/L (ref 0–40)
Albumin: 4.6 g/dL (ref 3.5–5.5)
BUN/Creatinine Ratio: 19 (ref 9–20)
BUN: 15 mg/dL (ref 6–24)
Bilirubin Total: 0.3 mg/dL (ref 0.0–1.2)
CALCIUM: 10 mg/dL (ref 8.7–10.2)
CO2: 24 mmol/L (ref 18–29)
CREATININE: 0.78 mg/dL (ref 0.76–1.27)
Chloride: 100 mmol/L (ref 96–106)
GFR calc Af Amer: 114 mL/min/{1.73_m2} (ref >59)
GFR, EST NON AFRICAN AMERICAN: 99 mL/min/{1.73_m2} (ref >59)
Globulin, Total: 3 g/dL (ref 1.5–4.5)
Glucose: 131 mg/dL — ABNORMAL HIGH (ref 65–99)
POTASSIUM: 5.1 mmol/L (ref 3.5–5.2)
SODIUM: 141 mmol/L (ref 134–144)
Total Protein: 7.6 g/dL (ref 6.0–8.5)

## 2015-11-12 LAB — LIPID PANEL
CHOL/HDL RATIO: 3.5 ratio (ref 0.0–5.0)
Cholesterol, Total: 153 mg/dL (ref 100–199)
HDL: 44 mg/dL (ref >39)
LDL CALC: 84 mg/dL (ref 0–99)
Triglycerides: 125 mg/dL (ref 0–149)
VLDL CHOLESTEROL CAL: 25 mg/dL (ref 5–40)

## 2015-12-10 ENCOUNTER — Encounter: Payer: Self-pay | Admitting: Family Medicine

## 2015-12-10 ENCOUNTER — Ambulatory Visit (INDEPENDENT_AMBULATORY_CARE_PROVIDER_SITE_OTHER): Payer: BLUE CROSS/BLUE SHIELD | Admitting: Family Medicine

## 2015-12-10 DIAGNOSIS — E785 Hyperlipidemia, unspecified: Secondary | ICD-10-CM

## 2015-12-10 DIAGNOSIS — G8929 Other chronic pain: Secondary | ICD-10-CM | POA: Diagnosis not present

## 2015-12-10 DIAGNOSIS — I10 Essential (primary) hypertension: Secondary | ICD-10-CM | POA: Diagnosis not present

## 2015-12-10 DIAGNOSIS — M545 Low back pain, unspecified: Secondary | ICD-10-CM

## 2015-12-10 MED ORDER — OXYCODONE HCL ER 40 MG PO T12A: 40.0000 mg | EXTENDED_RELEASE_TABLET | Freq: Two times a day (BID) | ORAL | 0 refills | Status: DC

## 2015-12-10 MED ORDER — OXYCODONE-ACETAMINOPHEN 10-325 MG PO TABS: 1.0000 | ORAL_TABLET | Freq: Three times a day (TID) | ORAL | 0 refills | Status: DC | PRN

## 2015-12-10 MED ORDER — GEMFIBROZIL 600 MG PO TABS: ORAL_TABLET | ORAL | 0 refills | Status: DC

## 2015-12-10 MED ORDER — LISINOPRIL 20 MG PO TABS: 20.0000 mg | ORAL_TABLET | Freq: Every day | ORAL | 0 refills | Status: DC

## 2015-12-10 NOTE — Progress Notes (Signed)
Name: Lawrence Thornton.   MRN: [id number]    DOB: 09-May-1955   Date:12/10/2015       Progress Note  Subjective  Chief Complaint  Chief Complaint  Patient presents with  . Medication Refill  . Pain  . Hypertension    Hypertension  This is a chronic problem. The problem has been gradually worsening since onset. The problem is uncontrolled. Pertinent negatives include no blurred vision, chest pain, headaches, palpitations or shortness of breath. Past treatments include ACE inhibitors. There is no history of kidney disease, CAD/MI or CVA.  Back Pain  This is a chronic problem. The problem has been gradually worsening (especially worse at night) since onset. The pain is present in the lumbar spine. The pain does not radiate. The pain is at a severity of 4/10. The pain is moderate. The pain is worse during the night. The symptoms are aggravated by bending, lying down and position (lying down flat makes it worse.). Pertinent negatives include no bladder incontinence, bowel incontinence, chest pain, fever, headaches or weakness. Risk factors include obesity. He has tried analgesics (Has been referred to Pain Clinic but does not have appointment until September 12th) for the symptoms. The treatment provided significant relief.  Hyperlipidemia  This is a chronic problem. The problem is controlled. Recent lipid tests were reviewed and are normal. He has no history of obesity. Pertinent negatives include no chest pain, myalgias or shortness of breath. Current antihyperlipidemic treatment includes fibric acid derivatives.     Past Medical History:  Diagnosis Date  . Arthritis    oa  . Hypertension     Past Surgical History:  Procedure Laterality Date  . APPENDECTOMY  age 69  . BACK SURGERY  lower back surgery x 3    L 3 L 4 and L 5 are fused together  . JOINT REPLACEMENT    . metal removed from eye  years ago  . sebaceous cyst removed Right 15 years ago   middle  finger   . TOE FUSION  Right    4th toe  . 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 TOTAL KNEE ARTHROPLASTY Right 12/21/2014   Procedure: RIGHT TOTAL KNEE ARTHROPLASTY;  Surgeon: Eugenia Mcalpine, MD;  Location: [facility];  Service: Orthopedics;  Laterality: Right;  . TOTAL KNEE ARTHROPLASTY Right 12/21/14    Family History  Problem Relation Age of Onset  . Cancer Mother     Throat  . Hypertension Father   . Hyperlipidemia Father     Social History   Social History  . Marital status: Married    Spouse name: N/A  . Number of children: N/A  . Years of education: N/A   Occupational History  . Not on file.   Social History Main Topics  . Smoking status: Former Smoker    Quit date: 04/07/1999  . Smokeless tobacco: Never Used  . Alcohol use No  . Drug use: No  . Sexual activity: Yes    Birth control/ protection: Condom   Other Topics Concern  . Not on file   Social History Narrative  . No narrative on file     Current Outpatient Prescriptions:  .  doxazosin (CARDURA) 8 MG tablet, Take 1 tablet (8 mg total) by mouth at bedtime. (Patient not taking: Reported on 11/11/2015), Disp: 90 tablet, Rfl: 3 .  gemfibrozil (LOPID) 600  MG tablet, take 1 tablet by mouth twice a day before meals, Disp: 180 tablet, Rfl: 0 .  Linaclotide (LINZESS) 145 MCG CAPS capsule, Take 1 capsule (145 mcg total) by mouth daily., Disp: 30 capsule, Rfl: 0 .  lisinopril (PRINIVIL,ZESTRIL) 20 MG tablet, Take 1 tablet (20 mg total) by mouth at bedtime., Disp: 90 tablet, Rfl: 0 .  oxyCODONE (OXYCONTIN) 40 mg 12 hr tablet, Take 1 tablet (40 mg total) by mouth every 12 (twelve) hours., Disp: 60 tablet, Rfl: 0 .  oxyCODONE-acetaminophen (PERCOCET) 10-325 MG tablet, Take 1 tablet by mouth every 8 (eight) hours as needed for pain., Disp: 90 tablet, Rfl: 0  No Known Allergies   Review of Systems    Constitutional: Negative for fever.  Eyes: Negative for blurred vision.  Respiratory: Negative for shortness of breath.   Cardiovascular: Negative for chest pain and palpitations.  Gastrointestinal: Negative for bowel incontinence.  Genitourinary: Negative for bladder incontinence.  Musculoskeletal: Positive for back pain. Negative for myalgias.  Neurological: Negative for weakness and headaches.    Objective  Vitals:   12/10/15 0948  BP: (!) 144/68  Pulse: 81  Resp: 18  Temp: 98.9 F (37.2 C)  SpO2: 96%  Weight: 278 lb 1 oz (126.1 kg)  Height: 6' (1.829 m)    Physical Exam  Constitutional: He is oriented to person, place, and time and well-developed, well-nourished, and in no distress.  Cardiovascular: Normal rate, regular rhythm, S1 normal, S2 normal and normal heart sounds.   No murmur heard. Pulmonary/Chest: Effort normal and breath sounds normal. He has no wheezes.  Musculoskeletal:       Right ankle: He exhibits no swelling.       Left ankle: He exhibits no swelling.       Lumbar back: He exhibits tenderness, pain and spasm.       Back:  Tenderness on the right lower back along the paraspinal muscles, spasm and stiffness  in left lower back.  Neurological: He is alert and oriented to person, place, and time.  Psychiatric: Mood, memory, affect and judgment normal.  Nursing note and vitals reviewed.    Assessment & Plan  1. Chronic low back pain Has been referred to pain clinic, he has an appointment later this month. Refill for OxyContin and Percocet provided. Patient compliant with controlled substances agreement - oxyCODONE (OXYCONTIN) 40 mg 12 hr tablet; Take 1 tablet (40 mg total) by mouth every 12 (twelve) hours.  Dispense: 60 tablet; Refill: 0 - oxyCODONE-acetaminophen (PERCOCET) 10-325 MG tablet; Take 1 tablet by mouth every 8 (eight) hours as needed for pain.  Dispense: 90 tablet; Refill: 0  2. Dyslipidemia FLP at goal, reviewed.  Refill for  gemfibrozil provided - gemfibrozil (LOPID) 600 MG tablet; take 1 tablet by mouth twice a day before meals  Dispense: 180 tablet; Refill: 0  3. Essential hypertension Blood pressure elevated on initial check, improved on repeat. Continue on lisinopril at present dosage. - lisinopril (PRINIVIL,ZESTRIL) 20 MG tablet; Take 1 tablet (20 mg total) by mouth at bedtime.  Dispense: 90 tablet; Refill: 0   Rollie Hynek Asad A. Faylene KurtzShah [facility] 12/10/2015 10:18 AM

## 2016-01-09 ENCOUNTER — Ambulatory Visit (INDEPENDENT_AMBULATORY_CARE_PROVIDER_SITE_OTHER): Payer: BLUE CROSS/BLUE SHIELD | Admitting: Family Medicine

## 2016-01-09 ENCOUNTER — Encounter: Payer: Self-pay | Admitting: Family Medicine

## 2016-01-09 VITALS — BP 155/78 | HR 108 | Temp 98.6°F | Resp 16 | Ht 72.0 in | Wt 279.6 lb

## 2016-01-09 DIAGNOSIS — I10 Essential (primary) hypertension: Secondary | ICD-10-CM | POA: Diagnosis not present

## 2016-01-09 DIAGNOSIS — G8929 Other chronic pain: Secondary | ICD-10-CM | POA: Diagnosis not present

## 2016-01-09 DIAGNOSIS — M545 Low back pain: Secondary | ICD-10-CM | POA: Diagnosis not present

## 2016-01-09 MED ORDER — LISINOPRIL 20 MG PO TABS: 30.0000 mg | ORAL_TABLET | Freq: Every day | ORAL | 0 refills | Status: DC

## 2016-01-09 MED ORDER — OXYCODONE HCL ER 40 MG PO T12A: 40.0000 mg | EXTENDED_RELEASE_TABLET | Freq: Two times a day (BID) | ORAL | 0 refills | Status: DC

## 2016-01-09 MED ORDER — OXYCODONE-ACETAMINOPHEN 10-325 MG PO TABS: 1.0000 | ORAL_TABLET | Freq: Three times a day (TID) | ORAL | 0 refills | Status: DC | PRN

## 2016-01-09 NOTE — Progress Notes (Signed)
Name: Lawrence Thornton.   MRN: [id number]    DOB: 08/31/1955   Date:01/09/2016       Progress Note  Subjective  Chief Complaint  Chief Complaint  Patient presents with  . Follow-up    1 month F/U  . Back Pain    Is a little better with medication and able to walk now    Back Pain  This is a chronic problem. The problem occurs constantly. The pain is present in the lumbar spine. The pain does not radiate. The pain is at a severity of 3/10. The symptoms are aggravated by position. Pertinent negatives include no chest pain, headaches, leg pain or numbness. He has tried analgesics (Has seen Physician at [facility] for the symptoms.  Hypertension  This is a chronic problem. The problem has been gradually worsening since onset. The problem is uncontrolled. Pertinent negatives include no blurred vision, chest pain, headaches or palpitations. Past treatments include ACE inhibitors.     Past Medical History:  Diagnosis Date  . Arthritis    oa  . Hypertension     Past Surgical History:  Procedure Laterality Date  . APPENDECTOMY  age 65  . BACK SURGERY  lower back surgery x 3    L 3 L 4 and L 5 are fused together  . JOINT REPLACEMENT    . metal removed from eye  years ago  . sebaceous cyst removed Right 15 years ago   middle  finger   . TOE FUSION Right    4th toe  . 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 TOTAL KNEE ARTHROPLASTY Right 12/21/2014   Procedure: RIGHT TOTAL KNEE ARTHROPLASTY;  Surgeon: Eugenia Mcalpine, MD;  Location: [facility];  Service: Orthopedics;  Laterality: Right;  . TOTAL KNEE ARTHROPLASTY Right 12/21/14    Family History  Problem Relation Age of Onset  . Cancer Mother     Throat  . Hypertension Father   . Hyperlipidemia Father     Social History   Social History  . Marital status: Married    Spouse  name: N/A  . Number of children: N/A  . Years of education: N/A   Occupational History  . Not on file.   Social History Main Topics  . Smoking status: Former Smoker    Quit date: 04/07/1999  . Smokeless tobacco: Never Used  . Alcohol use No  . Drug use: No  . Sexual activity: Yes    Birth control/ protection: Condom   Other Topics Concern  . Not on file   Social History Narrative  . No narrative on file     Current Outpatient Prescriptions:  .  doxazosin (CARDURA) 8 MG tablet, Take 1 tablet (8 mg total) by mouth at bedtime., Disp: 90 tablet, Rfl: 3 .  gemfibrozil (LOPID) 600 MG tablet, take 1 tablet by mouth twice a day before meals, Disp: 180 tablet, Rfl: 0 .  Linaclotide (LINZESS) 145 MCG CAPS capsule, Take 1 capsule (145 mcg total) by mouth daily., Disp: 30 capsule, Rfl: 0 .  lisinopril (PRINIVIL,ZESTRIL) 20 MG tablet, Take 1.5 tablets (30 mg total) by mouth at bedtime., Disp: 135 tablet, Rfl: 0 .  oxyCODONE (OXYCONTIN) 40 mg 12 hr tablet, Take 1 tablet (40 mg total) by mouth every 12 (twelve) hours., Disp: 60 tablet, Rfl: 0 .  oxyCODONE-acetaminophen (PERCOCET) 10-325 MG  tablet, Take 1 tablet by mouth every 8 (eight) hours as needed for pain., Disp: 90 tablet, Rfl: 0 .  tiZANidine (ZANAFLEX) 4 MG tablet, , Disp: , Rfl: 0  No Known Allergies   Review of Systems  Eyes: Negative for blurred vision.  Cardiovascular: Negative for chest pain and palpitations.  Musculoskeletal: Positive for back pain.  Neurological: Negative for numbness and headaches.    Objective  Vitals:   01/09/16 0843 01/09/16 0914  BP: (!) 148/64 (!) 155/78  Pulse: (!) 108   Resp: 16   Temp: 98.6 F (37 C)   TempSrc: Oral   SpO2: 98%   Weight: 279 lb 9.6 oz (126.8 kg)   Height: 6' (1.829 m)     Physical Exam  Constitutional: He is oriented to person, place, and time and well-developed, well-nourished, and in no distress.  HENT:  Head: Normocephalic and atraumatic.  Cardiovascular: Normal  rate, regular rhythm and normal heart sounds.   No murmur heard. Pulmonary/Chest: Effort normal and breath sounds normal. He has no wheezes.  Musculoskeletal: He exhibits no edema.       Lumbar back: He exhibits tenderness, pain and spasm.       Back:  Neurological: He is alert and oriented to person, place, and time.  Psychiatric: Mood, memory, affect and judgment normal.  Nursing note and vitals reviewed.    Assessment & Plan  1. Chronic right-sided low back pain without sciatica Continue on opioid therapy, patient [facility]. Refills provided. Patient compliant with controlled substances agreement - oxyCODONE (OXYCONTIN) 40 mg 12 hr tablet; Take 1 tablet (40 mg total) by mouth every 12 (twelve) hours.  Dispense: 60 tablet; Refill: 0 - oxyCODONE-acetaminophen (PERCOCET) 10-325 MG tablet; Take 1 tablet by mouth every 8 (eight) hours as needed for pain.  Dispense: 90 tablet; Refill: 0  2. Essential hypertension BP is elevated, could be secondary to anxiety versus chronic pain, increase lisinopril to 30 mg daily, reassess in one month - lisinopril (PRINIVIL,ZESTRIL) 20 MG tablet; Take 1.5 tablets (30 mg total) by mouth at bedtime.  Dispense: 135 tablet; Refill: 0   Adiya Selmer Asad A. Faylene KurtzShah [facility] 01/09/2016 7:40 PM

## 2016-02-10 ENCOUNTER — Ambulatory Visit (INDEPENDENT_AMBULATORY_CARE_PROVIDER_SITE_OTHER): Payer: BLUE CROSS/BLUE SHIELD | Admitting: Family Medicine

## 2016-02-10 ENCOUNTER — Encounter: Payer: Self-pay | Admitting: Family Medicine

## 2016-02-10 VITALS — BP 143/72 | HR 88 | Temp 98.4°F | Resp 17 | Ht 72.0 in | Wt 279.4 lb

## 2016-02-10 DIAGNOSIS — R739 Hyperglycemia, unspecified: Secondary | ICD-10-CM

## 2016-02-10 DIAGNOSIS — G8929 Other chronic pain: Secondary | ICD-10-CM

## 2016-02-10 DIAGNOSIS — M545 Low back pain: Secondary | ICD-10-CM

## 2016-02-10 DIAGNOSIS — I10 Essential (primary) hypertension: Secondary | ICD-10-CM

## 2016-02-10 LAB — POCT GLYCOSYLATED HEMOGLOBIN (HGB A1C): HEMOGLOBIN A1C: 6.2

## 2016-02-10 MED ORDER — OXYCODONE HCL ER 40 MG PO T12A: 40.0000 mg | EXTENDED_RELEASE_TABLET | Freq: Two times a day (BID) | ORAL | 0 refills | Status: DC

## 2016-02-10 MED ORDER — OXYCODONE-ACETAMINOPHEN 10-325 MG PO TABS: 1.0000 | ORAL_TABLET | Freq: Three times a day (TID) | ORAL | 0 refills | Status: DC | PRN

## 2016-02-10 NOTE — Progress Notes (Signed)
Name: Lawrence PaulsMarshall R Combes Jr.   MRN: [id number]    DOB: [birth year]   Date:02/10/2016       Progress Note  Subjective  Chief Complaint  Chief Complaint  Patient presents with  . Follow-up    1 mo  . Medication Refill    Back Pain  This is a chronic problem. The problem occurs daily. The problem is unchanged. The pain is present in the lumbar spine. The pain does not radiate. The pain is at a severity of 3/10. The pain is moderate. The pain is worse during the night. The symptoms are aggravated by bending, lying down and position. Pertinent negatives include no bladder incontinence or bowel incontinence. Risk factors include obesity. He has tried analgesics for the symptoms. The treatment provided significant relief.     Past Medical History:  Diagnosis Date  . Arthritis    oa  . Hypertension     Past Surgical History:  Procedure Laterality Date  . APPENDECTOMY  age 60  . BACK SURGERY  lower back surgery x 3    L 3 L 4 and L 5 are fused together  . JOINT REPLACEMENT    . metal removed from eye  years ago  . sebaceous cyst removed Right 15 years ago   middle  finger   . TOE FUSION Right    4th toe  . TOTAL KNEE ARTHROPLASTY Left 08/10/2014   Procedure: LEFT TOTAL KNEE ARTHROPLASTY;  Surgeon: Eugenia Mcalpineobert Collins, MD;  Location: [facility];  Service: Orthopedics;  Laterality: Left;  . TOTAL KNEE ARTHROPLASTY Right 12/21/2014   Procedure: RIGHT TOTAL KNEE ARTHROPLASTY;  Surgeon: Eugenia Mcalpineobert Collins, MD;  Location: [facility];  Service: Orthopedics;  Laterality: Right;  . TOTAL KNEE ARTHROPLASTY Right 12/21/14    Family History  Problem Relation Age of Onset  . Cancer Mother     Throat  . Hypertension Father   . Hyperlipidemia Father     Social History   Social History  . Marital status: Married    Spouse name: N/A  . Number of children: N/A  . Years of education: N/A   Occupational History  . Not on file.   Social History Main Topics  . Smoking status: Former Smoker    Quit date:  04/07/1999  . Smokeless tobacco: Never Used  . Alcohol use No  . Drug use: No  . Sexual activity: Yes    Birth control/ protection: Condom   Other Topics Concern  . Not on file   Social History Narrative  . No narrative on file     Current Outpatient Prescriptions:  .  doxazosin (CARDURA) 8 MG tablet, Take 1 tablet (8 mg total) by mouth at bedtime., Disp: 90 tablet, Rfl: 3 .  gemfibrozil (LOPID) 600 MG tablet, take 1 tablet by mouth twice a day before meals, Disp: 180 tablet, Rfl: 0 .  Linaclotide (LINZESS) 145 MCG CAPS capsule, Take 1 capsule (145 mcg total) by mouth daily., Disp: 30 capsule, Rfl: 0 .  lisinopril (PRINIVIL,ZESTRIL) 20 MG tablet, Take 1.5 tablets (30 mg total) by mouth at bedtime., Disp: 135 tablet, Rfl: 0 .  oxyCODONE (OXYCONTIN) 40 mg 12 hr tablet, Take 1 tablet (40 mg total) by mouth every 12 (twelve) hours., Disp: 60 tablet, Rfl: 0 .  oxyCODONE-acetaminophen (PERCOCET) 10-325 MG tablet, Take 1 tablet by mouth every 8 (eight) hours as needed for pain., Disp: 90 tablet, Rfl: 0 .  tiZANidine (ZANAFLEX) 4 MG tablet, , Disp: , Rfl: 0  No  Known Allergies   Review of Systems  Gastrointestinal: Negative for bowel incontinence.  Genitourinary: Negative for bladder incontinence.  Musculoskeletal: Positive for back pain.      Objective  Vitals:   02/10/16 0836  BP: (!) 143/72  Pulse: 88  Resp: 17  Temp: 98.4 F (36.9 C)  TempSrc: Oral  SpO2: 96%  Weight: 279 lb 6.4 oz (126.7 kg)  Height: 6' (1.829 m)    Physical Exam  Constitutional: He is oriented to person, place, and time and well-developed, well-nourished, and in no distress.  Cardiovascular: Normal rate, regular rhythm, S1 normal, S2 normal and normal heart sounds.   No murmur heard. Pulmonary/Chest: Effort normal and breath sounds normal. He has no wheezes. He has no rhonchi.  Musculoskeletal:       Lumbar back: He exhibits tenderness. He exhibits no pain and no spasm.  Neurological: He is alert  and oriented to person, place, and time.  Nursing note and vitals reviewed.    Assessment & Plan  1. Chronic right-sided low back pain without sciatica Pain is responsive to opioid therapy, patient compliant with controlled substances agreement and understands the dependence potential, side effects, and drug interactions of opioids. Refills provided - oxyCODONE-acetaminophen (PERCOCET) 10-325 MG tablet; Take 1 tablet by mouth every 8 (eight) hours as needed for pain.  Dispense: 90 tablet; Refill: 0 - oxyCODONE (OXYCONTIN) 40 mg 12 hr tablet; Take 1 tablet (40 mg total) by mouth every 12 (twelve) hours.  Dispense: 60 tablet; Refill: 0  2. Hyperglycemia Point-of-care A1c 6.2%, consistent with prediabetes - POCT HgB A1C  3. Essential hypertension BP stable and controlled on present therapy, improving from last 2 months.   Lawrence Thornton Asad A. Faylene KurtzShah [facility] 02/10/2016 8:46 AM

## 2016-03-11 ENCOUNTER — Encounter: Payer: Self-pay | Admitting: Family Medicine

## 2016-03-11 ENCOUNTER — Ambulatory Visit (INDEPENDENT_AMBULATORY_CARE_PROVIDER_SITE_OTHER): Payer: BLUE CROSS/BLUE SHIELD | Admitting: Family Medicine

## 2016-03-11 DIAGNOSIS — I10 Essential (primary) hypertension: Secondary | ICD-10-CM

## 2016-03-11 DIAGNOSIS — T402X5A Adverse effect of other opioids, initial encounter: Secondary | ICD-10-CM

## 2016-03-11 DIAGNOSIS — G8929 Other chronic pain: Secondary | ICD-10-CM | POA: Diagnosis not present

## 2016-03-11 DIAGNOSIS — K5903 Drug induced constipation: Secondary | ICD-10-CM | POA: Diagnosis not present

## 2016-03-11 DIAGNOSIS — M545 Low back pain, unspecified: Secondary | ICD-10-CM

## 2016-03-11 DIAGNOSIS — E785 Hyperlipidemia, unspecified: Secondary | ICD-10-CM | POA: Diagnosis not present

## 2016-03-11 MED ORDER — OXYCODONE HCL ER 40 MG PO T12A: 40.0000 mg | EXTENDED_RELEASE_TABLET | Freq: Two times a day (BID) | ORAL | 0 refills | Status: DC

## 2016-03-11 MED ORDER — LINACLOTIDE 145 MCG PO CAPS: 145.0000 ug | ORAL_CAPSULE | Freq: Every day | ORAL | 0 refills | Status: DC

## 2016-03-11 MED ORDER — GEMFIBROZIL 600 MG PO TABS: ORAL_TABLET | ORAL | 0 refills | Status: DC

## 2016-03-11 MED ORDER — LISINOPRIL 20 MG PO TABS: 30.0000 mg | ORAL_TABLET | Freq: Every day | ORAL | 0 refills | Status: DC

## 2016-03-11 MED ORDER — OXYCODONE-ACETAMINOPHEN 10-325 MG PO TABS: 1.0000 | ORAL_TABLET | Freq: Three times a day (TID) | ORAL | 0 refills | Status: DC | PRN

## 2016-03-11 NOTE — Progress Notes (Signed)
Name: Lawrence PaulsMarshall R Ploch Jr.   MRN: [id number]    DOB: 09/15/1955   Date:03/11/2016       Progress Note  Subjective  Chief Complaint  Chief Complaint  Patient presents with  . Pain    pain medication refills  . Hypertension    Back Pain  This is a chronic problem. The problem occurs daily. The problem is unchanged. The pain is present in the lumbar spine. The pain does not radiate. The pain is at a severity of 3/10. The pain is moderate. The pain is worse during the night. The symptoms are aggravated by bending, lying down and position. Pertinent negatives include no bladder incontinence, bowel incontinence, chest pain, headaches or leg pain. Risk factors include obesity. He has tried analgesics for the symptoms. The treatment provided significant relief.  Hyperlipidemia  This is a chronic problem. The problem is controlled. Recent lipid tests were reviewed and are normal. Pertinent negatives include no chest pain, leg pain, myalgias or shortness of breath. Current antihyperlipidemic treatment includes fibric acid derivatives. Risk factors for coronary artery disease include dyslipidemia, male sex and obesity.  Hypertension  This is a chronic problem. The problem is unchanged. The problem is controlled. Pertinent negatives include no blurred vision, chest pain, headaches, orthopnea, palpitations, shortness of breath or sweats. Past treatments include ACE inhibitors. There is no history of kidney disease or CAD/MI.     Past Medical History:  Diagnosis Date  . Arthritis    oa  . Hypertension     Past Surgical History:  Procedure Laterality Date  . APPENDECTOMY  age 60  . BACK SURGERY  lower back surgery x 3    L 3 L 4 and L 5 are fused together  . JOINT REPLACEMENT    . metal removed from eye  years ago  . sebaceous cyst removed Right 15 years ago   middle  finger   . TOE FUSION Right    4th toe  . TOTAL KNEE ARTHROPLASTY Left 08/10/2014   Procedure: LEFT TOTAL KNEE  ARTHROPLASTY;  Surgeon: Eugenia Mcalpineobert Collins, MD;  Location: [facility];  Service: Orthopedics;  Laterality: Left;  . TOTAL KNEE ARTHROPLASTY Right 12/21/2014   Procedure: RIGHT TOTAL KNEE ARTHROPLASTY;  Surgeon: Eugenia Mcalpineobert Collins, MD;  Location: [facility];  Service: Orthopedics;  Laterality: Right;  . TOTAL KNEE ARTHROPLASTY Right 12/21/14    Family History  Problem Relation Age of Onset  . Cancer Mother     Throat  . Hypertension Father   . Hyperlipidemia Father     Social History   Social History  . Marital status: Married    Spouse name: N/A  . Number of children: N/A  . Years of education: N/A   Occupational History  . Not on file.   Social History Main Topics  . Smoking status: Former Smoker    Quit date: 04/07/1999  . Smokeless tobacco: Never Used  . Alcohol use No  . Drug use: No  . Sexual activity: Yes    Birth control/ protection: Condom   Other Topics Concern  . Not on file   Social History Narrative  . No narrative on file     Current Outpatient Prescriptions:  .  doxazosin (CARDURA) 8 MG tablet, Take 1 tablet (8 mg total) by mouth at bedtime., Disp: 90 tablet, Rfl: 3 .  gemfibrozil (LOPID) 600 MG tablet, take 1 tablet by mouth twice a day before meals, Disp: 180 tablet, Rfl: 0 .  Linaclotide (LINZESS) 145 MCG  CAPS capsule, Take 1 capsule (145 mcg total) by mouth daily., Disp: 30 capsule, Rfl: 0 .  lisinopril (PRINIVIL,ZESTRIL) 20 MG tablet, Take 1.5 tablets (30 mg total) by mouth at bedtime., Disp: 135 tablet, Rfl: 0 .  oxyCODONE (OXYCONTIN) 40 mg 12 hr tablet, Take 1 tablet (40 mg total) by mouth every 12 (twelve) hours., Disp: 60 tablet, Rfl: 0 .  oxyCODONE-acetaminophen (PERCOCET) 10-325 MG tablet, Take 1 tablet by mouth every 8 (eight) hours as needed for pain., Disp: 90 tablet, Rfl: 0 .  tiZANidine (ZANAFLEX) 4 MG tablet, , Disp: , Rfl: 0  No Known Allergies   Review of Systems  Eyes: Negative for blurred vision.  Respiratory: Negative for shortness of breath.    Cardiovascular: Negative for chest pain, palpitations and orthopnea.  Gastrointestinal: Negative for bowel incontinence.  Genitourinary: Negative for bladder incontinence.  Musculoskeletal: Positive for back pain. Negative for myalgias.  Neurological: Negative for headaches.    Objective  Vitals:   03/11/16 1033  BP: 138/64  Pulse: 99  Resp: 18  Temp: 98.1 F (36.7 C)  TempSrc: Oral  SpO2: 98%  Weight: 285 lb (129.3 kg)  Height: 6' (1.829 m)    Physical Exam  Constitutional: He is oriented to person, place, and time and well-developed, well-nourished, and in no distress.  HENT:  Head: Normocephalic and atraumatic.  Cardiovascular: Normal rate, regular rhythm, S1 normal, S2 normal and normal heart sounds.   No murmur heard. Pulmonary/Chest: Effort normal and breath sounds normal. He has no wheezes. He has no rhonchi.  Abdominal: Soft. Bowel sounds are normal.  Musculoskeletal:       Lumbar back: He exhibits tenderness. He exhibits no pain and no spasm.  Neurological: He is alert and oriented to person, place, and time.  Psychiatric: Mood, memory, affect and judgment normal.  Nursing note and vitals reviewed.    Recent Results (from the past 2160 hour(s))  POCT HgB A1C     Status: Abnormal   Collection Time: 02/10/16  9:39 AM  Result Value Ref Range   Hemoglobin A1C 6.2      Assessment & Plan  1. Chronic right-sided low back pain without sciatica Stable, responsive to opioid therapy, refills provided - oxyCODONE-acetaminophen (PERCOCET) 10-325 MG tablet; Take 1 tablet by mouth every 8 (eight) hours as needed for pain.  Dispense: 90 tablet; Refill: 0 - oxyCODONE (OXYCONTIN) 40 mg 12 hr tablet; Take 1 tablet (40 mg total) by mouth every 12 (twelve) hours.  Dispense: 60 tablet; Refill: 0  2. Constipation due to opioid therapy  - linaclotide (LINZESS) 145 MCG CAPS capsule; Take 1 capsule (145 mcg total) by mouth daily.  Dispense: 30 capsule; Refill: 0  3.  Dyslipidemia  - gemfibrozil (LOPID) 600 MG tablet; take 1 tablet by mouth twice a day before meals  Dispense: 180 tablet; Refill: 0  4. Essential hypertension  - lisinopril (PRINIVIL,ZESTRIL) 20 MG tablet; Take 1.5 tablets (30 mg total) by mouth at bedtime.  Dispense: 135 tablet; Refill: 0   Korby Ratay Asad A. Faylene KurtzShah [facility] 03/11/2016 10:42 AM

## 2016-04-10 ENCOUNTER — Ambulatory Visit (INDEPENDENT_AMBULATORY_CARE_PROVIDER_SITE_OTHER): Payer: BLUE CROSS/BLUE SHIELD | Admitting: Family Medicine

## 2016-04-10 ENCOUNTER — Encounter: Payer: Self-pay | Admitting: Family Medicine

## 2016-04-10 VITALS — BP 140/71 | HR 106 | Temp 98.2°F | Resp 17 | Ht 72.0 in | Wt 282.1 lb

## 2016-04-10 DIAGNOSIS — M545 Low back pain: Secondary | ICD-10-CM | POA: Diagnosis not present

## 2016-04-10 DIAGNOSIS — G8929 Other chronic pain: Secondary | ICD-10-CM

## 2016-04-10 DIAGNOSIS — J069 Acute upper respiratory infection, unspecified: Secondary | ICD-10-CM

## 2016-04-10 MED ORDER — OXYCODONE-ACETAMINOPHEN 10-325 MG PO TABS: 1.0000 | ORAL_TABLET | Freq: Three times a day (TID) | ORAL | 0 refills | Status: DC | PRN

## 2016-04-10 MED ORDER — OXYCODONE HCL ER 40 MG PO T12A: 40.0000 mg | EXTENDED_RELEASE_TABLET | Freq: Two times a day (BID) | ORAL | 0 refills | Status: DC

## 2016-04-10 NOTE — Progress Notes (Signed)
Name: Lawrence Thornton.   MRN: [id number]    DOB: April 03, 1956   Date:04/10/2016       Progress Note  Subjective  Chief Complaint  Chief Complaint  Patient presents with  . Follow-up    1 mo  . Medication Refill    Back Pain  This is a chronic problem. The problem occurs daily. The problem is unchanged. The pain is present in the lumbar spine. The pain does not radiate. The pain is at a severity of 3/10. The pain is moderate. The pain is worse during the night. The symptoms are aggravated by bending, lying down and position. Pertinent negatives include no bladder incontinence, bowel incontinence, leg pain or numbness. Risk factors include obesity. He has tried analgesics for the symptoms. The treatment provided significant relief.     Past Medical History:  Diagnosis Date  . Arthritis    oa  . Hypertension     Past Surgical History:  Procedure Laterality Date  . APPENDECTOMY  age 26  . BACK SURGERY  lower back surgery x 3    L 3 L 4 and L 5 are fused together  . JOINT REPLACEMENT    . metal removed from eye  years ago  . sebaceous cyst removed Right 15 years ago   middle  finger   . TOE FUSION Right    4th toe  . 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 TOTAL KNEE ARTHROPLASTY Right 12/21/2014   Procedure: RIGHT TOTAL KNEE ARTHROPLASTY;  Surgeon: Eugenia Mcalpine, MD;  Location: [facility];  Service: Orthopedics;  Laterality: Right;  . TOTAL KNEE ARTHROPLASTY Right 12/21/14    Family History  Problem Relation Age of Onset  . Cancer Mother     Throat  . Hypertension Father   . Hyperlipidemia Father     Social History   Social History  . Marital status: Married    Spouse name: N/A  . Number of children: N/A  . Years of education: N/A   Occupational History  . Not on file.   Social History Main Topics  . Smoking status: Former  Smoker    Quit date: 04/07/1999  . Smokeless tobacco: Never Used  . Alcohol use No  . Drug use: No  . Sexual activity: Yes    Birth control/ protection: Condom   Other Topics Concern  . Not on file   Social History Narrative  . No narrative on file     Current Outpatient Prescriptions:  .  doxazosin (CARDURA) 8 MG tablet, Take 1 tablet (8 mg total) by mouth at bedtime., Disp: 90 tablet, Rfl: 3 .  gemfibrozil (LOPID) 600 MG tablet, take 1 tablet by mouth twice a day before meals, Disp: 180 tablet, Rfl: 0 .  linaclotide (LINZESS) 145 MCG CAPS capsule, Take 1 capsule (145 mcg total) by mouth daily., Disp: 30 capsule, Rfl: 0 .  lisinopril (PRINIVIL,ZESTRIL) 20 MG tablet, Take 1.5 tablets (30 mg total) by mouth at bedtime., Disp: 135 tablet, Rfl: 0 .  oxyCODONE (OXYCONTIN) 40 mg 12 hr tablet, Take 1 tablet (40 mg total) by mouth every 12 (twelve) hours., Disp: 60 tablet, Rfl: 0 .  oxyCODONE-acetaminophen (PERCOCET) 10-325 MG tablet, Take 1 tablet by mouth every 8 (eight) hours as needed for pain., Disp: 90 tablet, Rfl: 0 .  tiZANidine (ZANAFLEX) 4 MG tablet, , Disp: , Rfl:  0  No Known Allergies   Review of Systems  Gastrointestinal: Negative for bowel incontinence.  Genitourinary: Negative for bladder incontinence.  Musculoskeletal: Positive for back pain.  Neurological: Negative for numbness.    Objective  Vitals:   04/10/16 1110  BP: 140/71  Pulse: (!) 106  Resp: 17  Temp: 98.2 F (36.8 C)  TempSrc: Oral  SpO2: 96%  Weight: 282 lb 1.6 oz (128 kg)  Height: 6' (1.829 m)    Physical Exam  Constitutional: He is oriented to person, place, and time and well-developed, well-nourished, and in no distress.  HENT:  Nasal mucosal inflammation, turbinate hypertrophy  Cardiovascular: Normal rate, regular rhythm, S1 normal, S2 normal and normal heart sounds.   No murmur heard. Pulmonary/Chest: Effort normal and breath sounds normal. He has no wheezes. He has no rhonchi.    Musculoskeletal:       Lumbar back: He exhibits tenderness. He exhibits no pain and no spasm.       Back:  Neurological: He is alert and oriented to person, place, and time.  Nursing note and vitals reviewed.     Assessment & Plan  1. Chronic right-sided low back pain without sciatica Stable and responsive to opioid therapy, patient compliant with controlled substances agreement. Refills provided - oxyCODONE-acetaminophen (PERCOCET) 10-325 MG tablet; Take 1 tablet by mouth every 8 (eight) hours as needed for pain.  Dispense: 90 tablet; Refill: 0 - oxyCODONE (OXYCONTIN) 40 mg 12 hr tablet; Take 1 tablet (40 mg total) by mouth every 12 (twelve) hours.  Dispense: 60 tablet; Refill: 0  2. URI with cough and congestion Resolving, advised to use Flonase for 3-4 days to relieve nasal and sinus related symptoms. No antibiotics indicated at this time.   Carrson Lightcap Asad A. Faylene KurtzShah [facility] 04/10/2016 11:35 AM

## 2016-05-11 ENCOUNTER — Encounter: Payer: Self-pay | Admitting: Family Medicine

## 2016-05-11 ENCOUNTER — Ambulatory Visit (INDEPENDENT_AMBULATORY_CARE_PROVIDER_SITE_OTHER): Payer: BLUE CROSS/BLUE SHIELD | Admitting: Family Medicine

## 2016-05-11 DIAGNOSIS — G8929 Other chronic pain: Secondary | ICD-10-CM | POA: Diagnosis not present

## 2016-05-11 DIAGNOSIS — M545 Low back pain, unspecified: Secondary | ICD-10-CM

## 2016-05-11 MED ORDER — OXYCODONE-ACETAMINOPHEN 10-325 MG PO TABS: 1.0000 | ORAL_TABLET | Freq: Three times a day (TID) | ORAL | 0 refills | Status: DC | PRN

## 2016-05-11 MED ORDER — OXYCODONE HCL ER 40 MG PO T12A: 40.0000 mg | EXTENDED_RELEASE_TABLET | Freq: Two times a day (BID) | ORAL | 0 refills | Status: DC

## 2016-05-11 NOTE — Progress Notes (Signed)
Name: Lawrence Thornton.   MRN: [id number]    DOB: March 30, 1956   Date:05/11/2016       Progress Note  Subjective  Chief Complaint  Chief Complaint  Patient presents with  . Medication Refill    Back Pain  This is a chronic problem. The problem occurs daily. 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 The pain is present in the lumbar spine. The pain does not radiate. The pain is at a severity of 4/10. The pain is moderate. The pain is worse during the night. The symptoms are aggravated by bending, lying down and position. Pertinent negatives include no bladder incontinence, bowel incontinence or leg pain. Risk factors include obesity. He has tried analgesics for the symptoms. The treatment provided significant relief.     Past Medical History:  Diagnosis Date  . Arthritis    oa  . Hypertension     Past Surgical History:  Procedure Laterality Date  . APPENDECTOMY  age 61  . BACK SURGERY  lower back surgery x 3    L 3 L 4 and L 5 are fused together  . JOINT REPLACEMENT    . metal removed from eye  years ago  . sebaceous cyst removed Right 15 years ago   middle  finger   . TOE FUSION Right    4th toe  . TOTAL KNEE ARTHROPLASTY Left 08/10/2014   Procedure: LEFT TOTAL KNEE ARTHROPLASTY;  Surgeon: Eugenia Mcalpineobert Collins, MD;  Location: [facility];  Service: Orthopedics;  Laterality: Left;  . TOTAL KNEE ARTHROPLASTY Right 12/21/2014   Procedure: RIGHT TOTAL KNEE ARTHROPLASTY;  Surgeon: Eugenia Mcalpineobert Collins, MD;  Location: [facility];  Service: Orthopedics;  Laterality: Right;  . TOTAL KNEE ARTHROPLASTY Right 12/21/14    Family History  Problem Relation Age of Onset  . Cancer Mother     Throat  . Hypertension Father   . Hyperlipidemia Father     Social History   Social History  . Marital status: Married    Spouse name: N/A  . Number of children: N/A  . Years of education: N/A   Occupational History  . Not on file.   Social  History Main Topics  . Smoking status: Former Smoker    Quit date: 04/07/1999  . Smokeless tobacco: Never Used  . Alcohol use No  . Drug use: No  . Sexual activity: Yes    Birth control/ protection: Condom   Other Topics Concern  . Not on file   Social History Narrative  . No narrative on file     Current Outpatient Prescriptions:  .  doxazosin (CARDURA) 8 MG tablet, Take 1 tablet (8 mg total) by mouth at bedtime., Disp: 90 tablet, Rfl: 3 .  gemfibrozil (LOPID) 600 MG tablet, take 1 tablet by mouth twice a day before meals, Disp: 180 tablet, Rfl: 0 .  linaclotide (LINZESS) 145 MCG CAPS capsule, Take 1 capsule (145 mcg total) by mouth daily., Disp: 30 capsule, Rfl: 0 .  lisinopril (PRINIVIL,ZESTRIL) 20 MG tablet, Take 1.5 tablets (30 mg total) by mouth at bedtime., Disp: 135 tablet, Rfl: 0 .  oxyCODONE (OXYCONTIN) 40 mg 12 hr tablet, Take 1 tablet (40 mg total) by mouth every 12 (twelve) hours., Disp: 60 tablet, Rfl: 0 .  oxyCODONE-acetaminophen (PERCOCET) 10-325 MG tablet, Take 1 tablet by mouth every 8 (eight) hours as needed for pain., Disp: 90 tablet, Rfl: 0 .  tiZANidine (ZANAFLEX) 4 MG tablet, ,  Disp: , Rfl: 0  No Known Allergies   Review of Systems  Gastrointestinal: Negative for bowel incontinence.  Genitourinary: Negative for bladder incontinence.  Musculoskeletal: Positive for back pain.    Objective  Vitals:   05/11/16 0836  BP: 118/84  Pulse: 83  Resp: 18  Temp: 97.8 F (36.6 C)  TempSrc: Oral  SpO2: 95%  Weight: 277 lb 9.6 oz (125.9 kg)  Height: 6' (1.829 m)    Physical Exam  Constitutional: He is oriented to person, place, and time and well-developed, well-nourished, and in no distress.  Cardiovascular: Normal rate, regular rhythm, S1 normal, S2 normal and normal heart sounds.   No murmur heard. Pulmonary/Chest: Effort normal and breath sounds normal. He has no wheezes. He has no rhonchi.  Musculoskeletal:       Lumbar back: He exhibits tenderness.  He exhibits no pain and no spasm.       Back:  Neurological: He is alert and oriented to person, place, and time.  Psychiatric: Mood, memory, affect and judgment normal.  Nursing note and vitals reviewed.       Assessment & Plan  1. Chronic right-sided low back pain without sciatica Moderately worse, has been working on Database administrator, taking medications as prescribed. Patient compliant with controlled substances agreement. Refills provided. - oxyCODONE (OXYCONTIN) 40 mg 12 hr tablet; Take 1 tablet (40 mg total) by mouth every 12 (twelve) hours.  Dispense: 60 tablet; Refill: 0 - oxyCODONE-acetaminophen (PERCOCET) 10-325 MG tablet; Take 1 tablet by mouth every 8 (eight) hours as needed for pain.  Dispense: 90 tablet; Refill: 0   [name] 05/11/2016 8:54 AM

## 2016-06-08 ENCOUNTER — Ambulatory Visit (INDEPENDENT_AMBULATORY_CARE_PROVIDER_SITE_OTHER): Payer: BLUE CROSS/BLUE SHIELD | Admitting: Family Medicine

## 2016-06-08 ENCOUNTER — Encounter: Payer: Self-pay | Admitting: Family Medicine

## 2016-06-08 DIAGNOSIS — G8929 Other chronic pain: Secondary | ICD-10-CM | POA: Diagnosis not present

## 2016-06-08 DIAGNOSIS — M545 Low back pain, unspecified: Secondary | ICD-10-CM

## 2016-06-08 DIAGNOSIS — E785 Hyperlipidemia, unspecified: Secondary | ICD-10-CM | POA: Diagnosis not present

## 2016-06-08 MED ORDER — OXYCODONE-ACETAMINOPHEN 10-325 MG PO TABS: 1.0000 | ORAL_TABLET | Freq: Three times a day (TID) | ORAL | 0 refills | Status: DC | PRN

## 2016-06-08 MED ORDER — GEMFIBROZIL 600 MG PO TABS: ORAL_TABLET | ORAL | 0 refills | Status: DC

## 2016-06-08 MED ORDER — OXYCODONE HCL ER 40 MG PO T12A: 40.0000 mg | EXTENDED_RELEASE_TABLET | Freq: Two times a day (BID) | ORAL | 0 refills | Status: DC

## 2016-06-08 NOTE — Progress Notes (Signed)
Name: Lawrence PaulsMarshall R Shimamoto Jr.   MRN: [id number]    DOB: 12-19-1955   Date:06/08/2016       Progress Note  Subjective  Chief Complaint  Chief Complaint  Patient presents with  . Medication Refill    Back Pain  This is a chronic problem. The problem occurs daily. The problem has been gradually worsening since onset. The pain is present in the lumbar spine. The pain does not radiate. The pain is at a severity of 3/10. The pain is moderate. The pain is worse during the night. The symptoms are aggravated by bending, lying down and position. Pertinent negatives include no bladder incontinence, bowel incontinence or leg pain. Risk factors include obesity. He has tried analgesics for the symptoms. The treatment provided significant relief.  Hyperlipidemia  This is a chronic problem. The problem is controlled. Recent lipid tests were reviewed and are normal. Exacerbating diseases include obesity. Pertinent negatives include no leg pain, myalgias or shortness of breath. Current antihyperlipidemic treatment includes fibric acid derivatives.     Past Medical History:  Diagnosis Date  . Arthritis    oa  . Hypertension     Past Surgical History:  Procedure Laterality Date  . APPENDECTOMY  age 61  . BACK SURGERY  lower back surgery x 3    L 3 L 4 and L 5 are fused together  . JOINT REPLACEMENT    . metal removed from eye  years ago  . sebaceous cyst removed Right 15 years ago   middle  finger   . TOE FUSION Right    4th toe  . TOTAL KNEE ARTHROPLASTY Left 08/10/2014   Procedure: LEFT TOTAL KNEE ARTHROPLASTY;  Surgeon: Eugenia Mcalpineobert Collins, MD;  Location: [facility];  Service: Orthopedics;  Laterality: Left;  . TOTAL KNEE ARTHROPLASTY Right 12/21/2014   Procedure: RIGHT TOTAL KNEE ARTHROPLASTY;  Surgeon: Eugenia Mcalpineobert Collins, MD;  Location: [facility];  Service: Orthopedics;  Laterality: Right;  . TOTAL KNEE ARTHROPLASTY Right 12/21/14    Family History  Problem Relation Age of Onset  . Cancer Mother     Throat   . Hypertension Father   . Hyperlipidemia Father     Social History   Social History  . Marital status: Married    Spouse name: N/A  . Number of children: N/A  . Years of education: N/A   Occupational History  . Not on file.   Social History Main Topics  . Smoking status: Former Smoker    Quit date: 04/07/1999  . Smokeless tobacco: Never Used  . Alcohol use No  . Drug use: No  . Sexual activity: Yes    Birth control/ protection: Condom   Other Topics Concern  . Not on file   Social History Narrative  . No narrative on file     Current Outpatient Prescriptions:  .  doxazosin (CARDURA) 8 MG tablet, Take 1 tablet (8 mg total) by mouth at bedtime., Disp: 90 tablet, Rfl: 3 .  gemfibrozil (LOPID) 600 MG tablet, take 1 tablet by mouth twice a day before meals, Disp: 180 tablet, Rfl: 0 .  linaclotide (LINZESS) 145 MCG CAPS capsule, Take 1 capsule (145 mcg total) by mouth daily., Disp: 30 capsule, Rfl: 0 .  lisinopril (PRINIVIL,ZESTRIL) 20 MG tablet, Take 1.5 tablets (30 mg total) by mouth at bedtime., Disp: 135 tablet, Rfl: 0 .  oxyCODONE (OXYCONTIN) 40 mg 12 hr tablet, Take 1 tablet (40 mg total) by mouth every 12 (twelve) hours., Disp: 60 tablet, Rfl:  0 .  oxyCODONE-acetaminophen (PERCOCET) 10-325 MG tablet, Take 1 tablet by mouth every 8 (eight) hours as needed for pain., Disp: 90 tablet, Rfl: 0 .  tiZANidine (ZANAFLEX) 4 MG tablet, , Disp: , Rfl: 0  No Known Allergies   Review of Systems  Respiratory: Negative for shortness of breath.   Gastrointestinal: Negative for bowel incontinence.  Genitourinary: Negative for bladder incontinence.  Musculoskeletal: Positive for back pain. Negative for myalgias.     Objective  Vitals:   06/08/16 0905  BP: 138/72  Pulse: 78  Resp: 18  Temp: 98.3 F (36.8 C)  TempSrc: Oral  SpO2: 97%  Weight: 274 lb 3.2 oz (124.4 kg)  Height: 6' (1.829 m)    Physical Exam  Constitutional: He is oriented to person, place, and time and  well-developed, well-nourished, and in no distress.  HENT:  Head: Normocephalic and atraumatic.  Cardiovascular: Normal rate, regular rhythm, S1 normal, S2 normal and normal heart sounds.   No murmur heard. Pulmonary/Chest: Effort normal and breath sounds normal. He has no wheezes. He has no rhonchi.  Musculoskeletal: He exhibits no edema.       Lumbar back: He exhibits tenderness. He exhibits no pain and no spasm.       Back:  Neurological: He is alert and oriented to person, place, and time.  Psychiatric: Mood, memory, affect and judgment normal.  Nursing note and vitals reviewed.     Assessment & Plan  1. Chronic right-sided low back pain without sciatica Stable and responsive to opioid therapy, patient compliant with controlled substances agreement - oxyCODONE (OXYCONTIN) 40 mg 12 hr tablet; Take 1 tablet (40 mg total) by mouth every 12 (twelve) hours.  Dispense: 60 tablet; Refill: 0 - oxyCODONE-acetaminophen (PERCOCET) 10-325 MG tablet; Take 1 tablet by mouth every 8 (eight) hours as needed for pain.  Dispense: 90 tablet; Refill: 0  2. Dyslipidemia  - Lipid panel - gemfibrozil (LOPID) 600 MG tablet; take 1 tablet by mouth twice a day before meals  Dispense: 180 tablet; Refill: 0   Timo Hartwig [facility] 06/08/2016 9:15 AM

## 2016-07-09 ENCOUNTER — Other Ambulatory Visit: Payer: Self-pay | Admitting: Family Medicine

## 2016-07-09 ENCOUNTER — Encounter: Payer: Self-pay | Admitting: Family Medicine

## 2016-07-09 ENCOUNTER — Ambulatory Visit (INDEPENDENT_AMBULATORY_CARE_PROVIDER_SITE_OTHER): Payer: BLUE CROSS/BLUE SHIELD | Admitting: Family Medicine

## 2016-07-09 VITALS — BP 138/72 | HR 85 | Temp 98.3°F | Resp 18 | Ht 72.0 in | Wt 275.4 lb

## 2016-07-09 DIAGNOSIS — I10 Essential (primary) hypertension: Secondary | ICD-10-CM

## 2016-07-09 DIAGNOSIS — M62830 Muscle spasm of back: Secondary | ICD-10-CM | POA: Diagnosis not present

## 2016-07-09 DIAGNOSIS — M545 Low back pain: Secondary | ICD-10-CM

## 2016-07-09 DIAGNOSIS — G8929 Other chronic pain: Secondary | ICD-10-CM

## 2016-07-09 LAB — LIPID PANEL
CHOL/HDL RATIO: 3 ratio (ref <5.0)
Cholesterol: 127 mg/dL (ref <200)
HDL: 42 mg/dL (ref >40)
LDL CALC: 67 mg/dL (ref <100)
TRIGLYCERIDES: 90 mg/dL (ref <150)
VLDL: 18 mg/dL (ref <30)

## 2016-07-09 MED ORDER — OXYCODONE HCL ER 40 MG PO T12A: 40.0000 mg | EXTENDED_RELEASE_TABLET | Freq: Two times a day (BID) | ORAL | 0 refills | Status: DC

## 2016-07-09 MED ORDER — OXYCODONE-ACETAMINOPHEN 10-325 MG PO TABS: 1.0000 | ORAL_TABLET | Freq: Three times a day (TID) | ORAL | 0 refills | Status: DC | PRN

## 2016-07-09 MED ORDER — LISINOPRIL 20 MG PO TABS: 30.0000 mg | ORAL_TABLET | Freq: Every day | ORAL | 0 refills | Status: DC

## 2016-07-09 MED ORDER — TIZANIDINE HCL 4 MG PO TABS: 4.0000 mg | ORAL_TABLET | Freq: Every day | ORAL | 2 refills | Status: DC

## 2016-07-09 NOTE — Progress Notes (Signed)
Name: Lawrence Thornton.   MRN: [id number]    DOB: [birth year]   Date:07/09/2016       Progress Note  Subjective  Chief Complaint  Chief Complaint  Patient presents with  . Medication Refill  . Hypertension    Hypertension  This is a chronic problem. The problem is unchanged. The problem is controlled. Pertinent negatives include no blurred vision, chest pain, headaches, orthopnea, palpitations or sweats. Past treatments include ACE inhibitors. There is no history of kidney disease or CAD/MI.  Back Pain  This is a chronic problem. The problem occurs daily. The problem is unchanged. The pain is present in the lumbar spine. The pain does not radiate. The pain is at a severity of 3/10. The pain is moderate. The pain is worse during the night. The symptoms are aggravated by bending, lying down and position. Stiffness is present at night. Pertinent negatives include no bladder incontinence, bowel incontinence, chest pain or headaches. Risk factors include obesity. He has tried analgesics for the symptoms. The treatment provided significant relief.     Past Medical History:  Diagnosis Date  . Arthritis    oa  . Hypertension     Past Surgical History:  Procedure Laterality Date  . APPENDECTOMY  age 15  . BACK SURGERY  lower back surgery x 3    L 3 L 4 and L 5 are fused together  . JOINT REPLACEMENT    . metal removed from eye  years ago  . sebaceous cyst removed Right 15 years ago   middle  finger   . TOE FUSION Right    4th toe  . 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 TOTAL KNEE ARTHROPLASTY Right 12/21/2014   Procedure: RIGHT TOTAL KNEE ARTHROPLASTY;  Surgeon: Eugenia Mcalpine, MD;  Location: [facility];  Service: Orthopedics;  Laterality: Right;  . TOTAL KNEE ARTHROPLASTY Right 12/21/14    Family History  Problem Relation Age of Onset  . Cancer  Mother     Throat  . Hypertension Father   . Hyperlipidemia Father     Social History   Social History  . Marital status: Married    Spouse name: N/A  . Number of children: N/A  . Years of education: N/A   Occupational History  . Not on file.   Social History Main Topics  . Smoking status: Former Smoker    Quit date: 04/07/1999  . Smokeless tobacco: Never Used  . Alcohol use No  . Drug use: No  . Sexual activity: Yes    Birth control/ protection: Condom   Other Topics Concern  . Not on file   Social History Narrative  . No narrative on file     Current Outpatient Prescriptions:  .  doxazosin (CARDURA) 8 MG tablet, Take 1 tablet (8 mg total) by mouth at bedtime., Disp: 90 tablet, Rfl: 3 .  gemfibrozil (LOPID) 600 MG tablet, take 1 tablet by mouth twice a day before meals, Disp: 180 tablet, Rfl: 0 .  linaclotide (LINZESS) 145 MCG CAPS capsule, Take 1 capsule (145 mcg total) by mouth daily., Disp: 30 capsule, Rfl: 0 .  lisinopril (PRINIVIL,ZESTRIL) 20 MG tablet, Take 1.5 tablets (30 mg total) by mouth at bedtime., Disp: 135 tablet, Rfl: 0 .  oxyCODONE (OXYCONTIN) 40 mg 12 hr tablet, Take 1 tablet (40 mg total) by mouth every 12 (  twelve) hours., Disp: 60 tablet, Rfl: 0 .  oxyCODONE-acetaminophen (PERCOCET) 10-325 MG tablet, Take 1 tablet by mouth every 8 (eight) hours as needed for pain., Disp: 90 tablet, Rfl: 0 .  tiZANidine (ZANAFLEX) 4 MG tablet, , Disp: , Rfl: 0  No Known Allergies   Review of Systems  Eyes: Negative for blurred vision.  Cardiovascular: Negative for chest pain, palpitations and orthopnea.  Gastrointestinal: Negative for bowel incontinence.  Genitourinary: Negative for bladder incontinence.  Musculoskeletal: Positive for back pain.  Neurological: Negative for headaches.    Objective  Vitals:   07/09/16 0811  BP: 138/72  Pulse: 85  Resp: 18  Temp: 98.3 F (36.8 C)  TempSrc: Oral  SpO2: 94%  Weight: 275 lb 6.4 oz (124.9 kg)  Height: 6'  (1.829 m)    Physical Exam  Constitutional: He is oriented to person, place, and time and well-developed, well-nourished, and in no distress.  Cardiovascular: Normal rate, regular rhythm, S1 normal, S2 normal and normal heart sounds.   No murmur heard. Pulmonary/Chest: Effort normal and breath sounds normal. He has no wheezes. He has no rhonchi.  Musculoskeletal:       Lumbar back: He exhibits tenderness. He exhibits no pain and no spasm.       Back:  Neurological: He is alert and oriented to person, place, and time.  Psychiatric: Mood, memory, affect and judgment normal.  Nursing note and vitals reviewed.      Assessment & Plan  1. Chronic right-sided low back pain without sciatica Stable, responsive to opioid therapy, refills provided - oxyCODONE (OXYCONTIN) 40 mg 12 hr tablet; Take 1 tablet (40 mg total) by mouth every 12 (twelve) hours.  Dispense: 60 tablet; Refill: 0 - oxyCODONE-acetaminophen (PERCOCET) 10-325 MG tablet; Take 1 tablet by mouth every 8 (eight) hours as needed for pain.  Dispense: 90 tablet; Refill: 0  2. Essential hypertension Pressure controlled on lisinopril - lisinopril (PRINIVIL,ZESTRIL) 20 MG tablet; Take 1.5 tablets (30 mg total) by mouth at bedtime.  Dispense: 135 tablet; Refill: 0  3. Spasm of back muscles  - tiZANidine (ZANAFLEX) 4 MG tablet; Take 1 tablet (4 mg total) by mouth at bedtime.  Dispense: 30 tablet; Refill: 2   Fatim Vanderschaaf [facility] 07/09/2016 8:26 AM

## 2016-08-07 ENCOUNTER — Encounter: Payer: Self-pay | Admitting: Family Medicine

## 2016-08-07 ENCOUNTER — Ambulatory Visit (INDEPENDENT_AMBULATORY_CARE_PROVIDER_SITE_OTHER): Payer: BLUE CROSS/BLUE SHIELD | Admitting: Family Medicine

## 2016-08-07 DIAGNOSIS — K5903 Drug induced constipation: Secondary | ICD-10-CM

## 2016-08-07 DIAGNOSIS — M545 Low back pain: Secondary | ICD-10-CM

## 2016-08-07 DIAGNOSIS — T402X5A Adverse effect of other opioids, initial encounter: Secondary | ICD-10-CM

## 2016-08-07 DIAGNOSIS — G8929 Other chronic pain: Secondary | ICD-10-CM

## 2016-08-07 MED ORDER — OXYCODONE-ACETAMINOPHEN 10-325 MG PO TABS: 1.0000 | ORAL_TABLET | Freq: Three times a day (TID) | ORAL | 0 refills | Status: DC | PRN

## 2016-08-07 MED ORDER — OXYCODONE HCL ER 40 MG PO T12A: 40.0000 mg | EXTENDED_RELEASE_TABLET | Freq: Two times a day (BID) | ORAL | 0 refills | Status: DC

## 2016-08-07 MED ORDER — LINACLOTIDE 145 MCG PO CAPS: 145.0000 ug | ORAL_CAPSULE | Freq: Every day | ORAL | 0 refills | Status: DC

## 2016-08-07 NOTE — Progress Notes (Signed)
Name: Lawrence PaulsMarshall R Doswell Jr.   MRN: [id number]    DOB: October 17, 1955   Date:08/07/2016       Progress Note  Subjective  Chief Complaint  Chief Complaint  Patient presents with  . Follow-up    1 mo  . Medication Refill    Back Pain  This is a chronic problem. The problem occurs constantly. The problem is unchanged. The pain is present in the lumbar spine. The pain is at a severity of 3/10. The symptoms are aggravated by sitting (prolonged sitting). Pertinent negatives include no bladder incontinence, bowel incontinence, leg pain or numbness. (Constipation from opioid use)   Patient is also experiencing intermittent constipation mainly due to opioids, difficult to pass a stool and firm consistency. He takes Linzess periodically which seems to help.    Past Medical History:  Diagnosis Date  . Arthritis    oa  . Hypertension     Past Surgical History:  Procedure Laterality Date  . APPENDECTOMY  age 61  . BACK SURGERY  lower back surgery x 3    L 3 L 4 and L 5 are fused together  . JOINT REPLACEMENT    . metal removed from eye  years ago  . sebaceous cyst removed Right 15 years ago   middle  finger   . TOE FUSION Right    4th toe  . TOTAL KNEE ARTHROPLASTY Left 08/10/2014   Procedure: LEFT TOTAL KNEE ARTHROPLASTY;  Surgeon: Eugenia Mcalpineobert Collins, MD;  Location: [facility];  Service: Orthopedics;  Laterality: Left;  . TOTAL KNEE ARTHROPLASTY Right 12/21/2014   Procedure: RIGHT TOTAL KNEE ARTHROPLASTY;  Surgeon: Eugenia Mcalpineobert Collins, MD;  Location: [facility];  Service: Orthopedics;  Laterality: Right;  . TOTAL KNEE ARTHROPLASTY Right 12/21/14    Family History  Problem Relation Age of Onset  . Cancer Mother     Throat  . Hypertension Father   . Hyperlipidemia Father     Social History   Social History  . Marital status: Married    Spouse name: N/A  . Number of children: N/A  . Years of education: N/A   Occupational History  . Not on file.   Social History Main Topics  . Smoking status:  Former Smoker    Quit date: 04/07/1999  . Smokeless tobacco: Never Used  . Alcohol use No  . Drug use: No  . Sexual activity: Yes    Birth control/ protection: Condom   Other Topics Concern  . Not on file   Social History Narrative  . No narrative on file     Current Outpatient Prescriptions:  .  doxazosin (CARDURA) 8 MG tablet, Take 1 tablet (8 mg total) by mouth at bedtime., Disp: 90 tablet, Rfl: 3 .  gemfibrozil (LOPID) 600 MG tablet, take 1 tablet by mouth twice a day before meals, Disp: 180 tablet, Rfl: 0 .  linaclotide (LINZESS) 145 MCG CAPS capsule, Take 1 capsule (145 mcg total) by mouth daily., Disp: 30 capsule, Rfl: 0 .  lisinopril (PRINIVIL,ZESTRIL) 20 MG tablet, Take 1.5 tablets (30 mg total) by mouth at bedtime., Disp: 135 tablet, Rfl: 0 .  oxyCODONE (OXYCONTIN) 40 mg 12 hr tablet, Take 1 tablet (40 mg total) by mouth every 12 (twelve) hours., Disp: 60 tablet, Rfl: 0 .  oxyCODONE-acetaminophen (PERCOCET) 10-325 MG tablet, Take 1 tablet by mouth every 8 (eight) hours as needed for pain., Disp: 90 tablet, Rfl: 0 .  tiZANidine (ZANAFLEX) 4 MG tablet, Take 1 tablet (4 mg total) by  mouth at bedtime., Disp: 30 tablet, Rfl: 2  No Known Allergies   Review of Systems  Gastrointestinal: Negative for bowel incontinence.  Genitourinary: Negative for bladder incontinence.  Musculoskeletal: Positive for back pain.  Neurological: Negative for numbness.      Objective  Vitals:   08/07/16 0921  BP: 138/78  Pulse: 89  Resp: 16  Temp: 98.2 F (36.8 C)  TempSrc: Oral  SpO2: 96%  Weight: 266 lb 8 oz (120.9 kg)  Height: 6' (1.829 m)    Physical Exam  Constitutional: He is oriented to person, place, and time and well-developed, well-nourished, and in no distress.  HENT:  Head: Normocephalic and atraumatic.  Cardiovascular: Normal rate, regular rhythm, S1 normal, S2 normal and normal heart sounds.   No murmur heard. Pulmonary/Chest: Effort normal and breath sounds normal.  He has no wheezes. He has no rhonchi.  Abdominal: Soft. Bowel sounds are normal. There is no tenderness.  Musculoskeletal: He exhibits no edema.       Lumbar back: He exhibits tenderness. He exhibits no pain and no spasm.       Back:  Neurological: He is alert and oriented to person, place, and time.  Psychiatric: Mood, memory, affect and judgment normal.  Nursing note and vitals reviewed.      Assessment & Plan  1. Chronic right-sided low back pain without sciatica Stable on opioid therapy, patient compliant with controlled substances agreement. Refills provided - oxyCODONE (OXYCONTIN) 40 mg 12 hr tablet; Take 1 tablet (40 mg total) by mouth every 12 (twelve) hours.  Dispense: 60 tablet; Refill: 0 - oxyCODONE-acetaminophen (PERCOCET) 10-325 MG tablet; Take 1 tablet by mouth every 8 (eight) hours as needed for pain.  Dispense: 90 tablet; Refill: 0  2. Constipation due to opioid therapy  - linaclotide (LINZESS) 145 MCG CAPS capsule; Take 1 capsule (145 mcg total) by mouth daily.  Dispense: 30 capsule; Refill: 0   Greogory Cornette [facility]ad [facility] 08/07/2016 9:36 AM

## 2016-08-12 IMAGING — CR DG CHEST 2V
1 series · 2 of 2 positions shown · non-contrast
Comparison: None.

CLINICAL DATA: Preop for total knee replacement of a high blood
pressure by history

EXAM:
CHEST  2 VIEW

[Series 1: kdxr chest pa (or ap) and lat · 0.14mm/px · 2 of 2 slices shown]
[im 1/2]
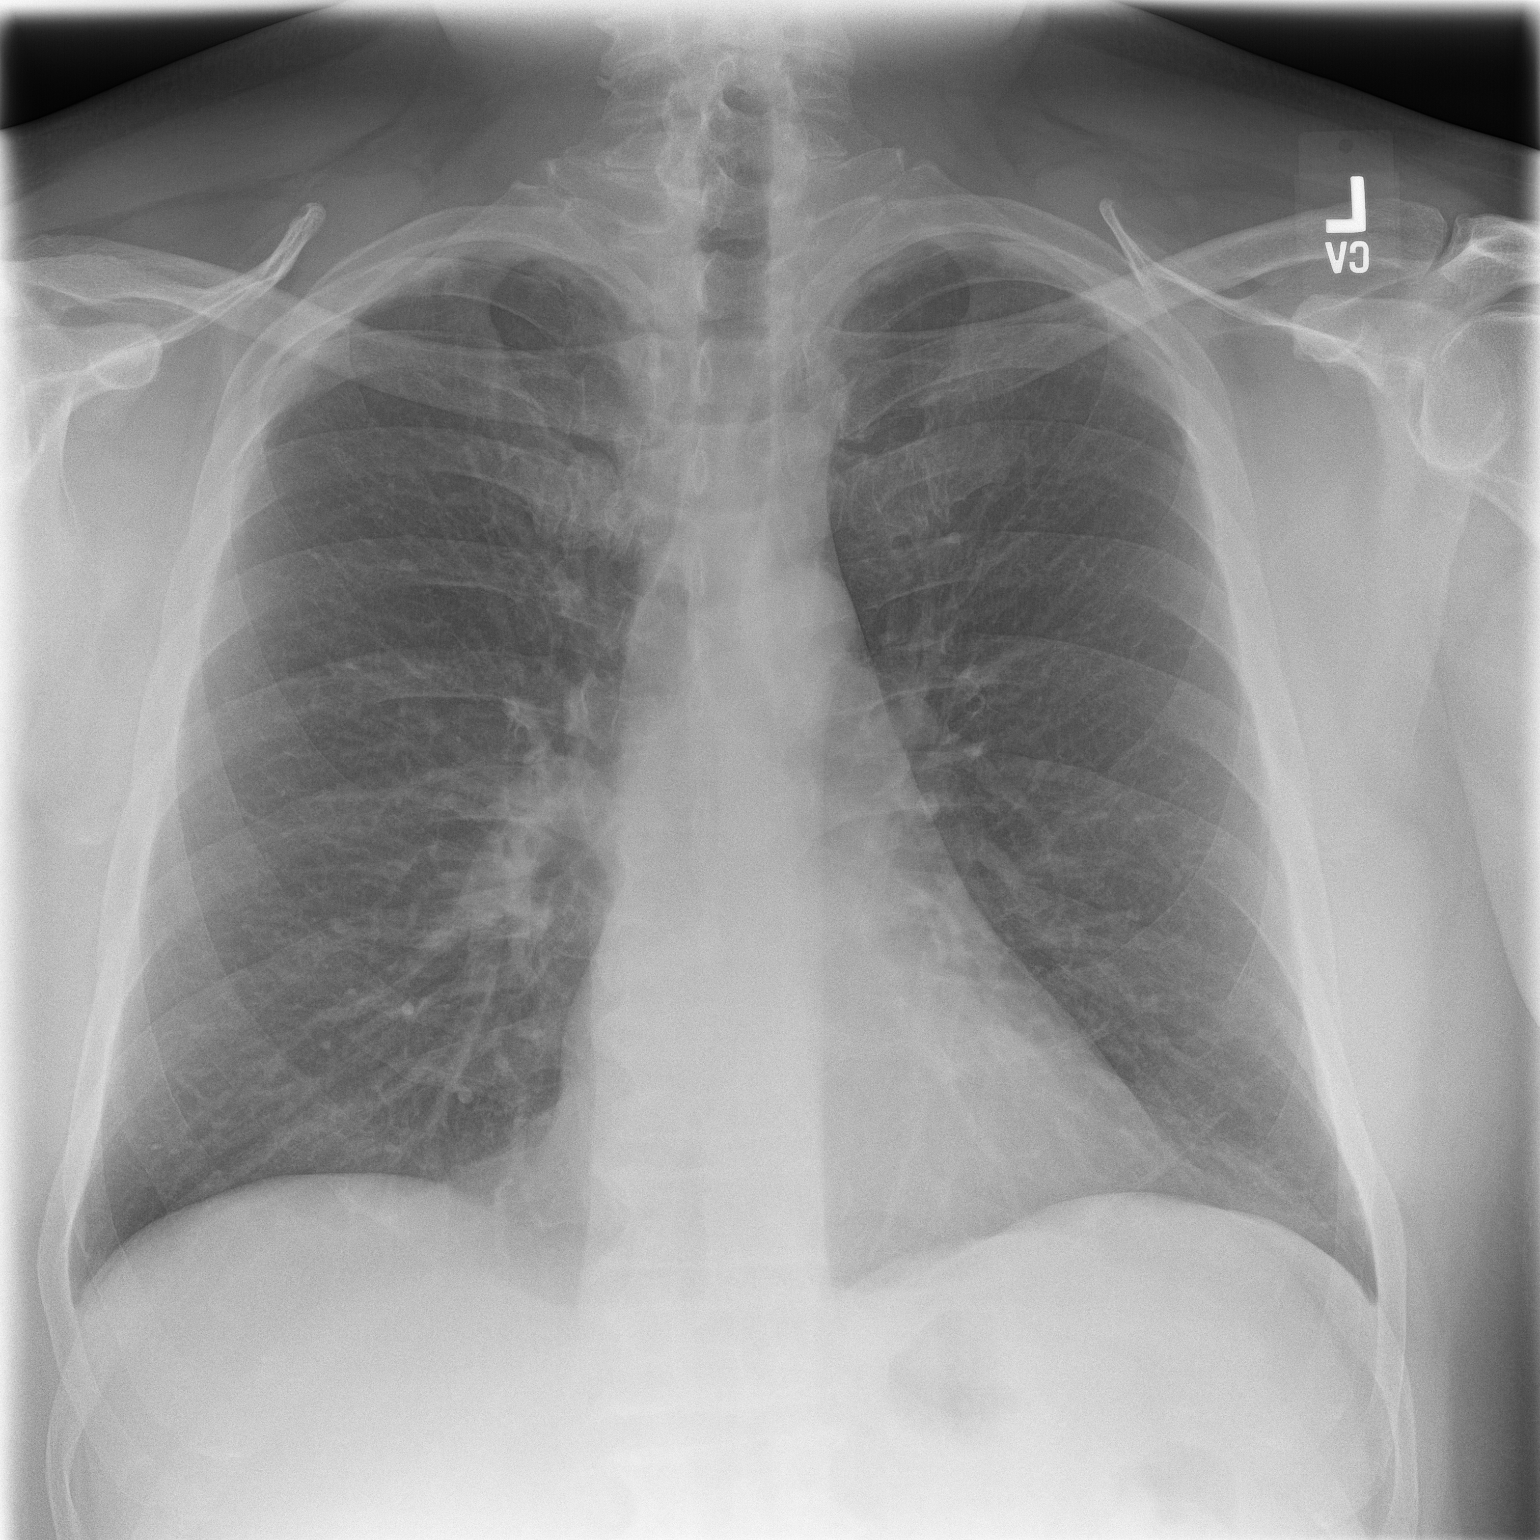
[im 2/2]
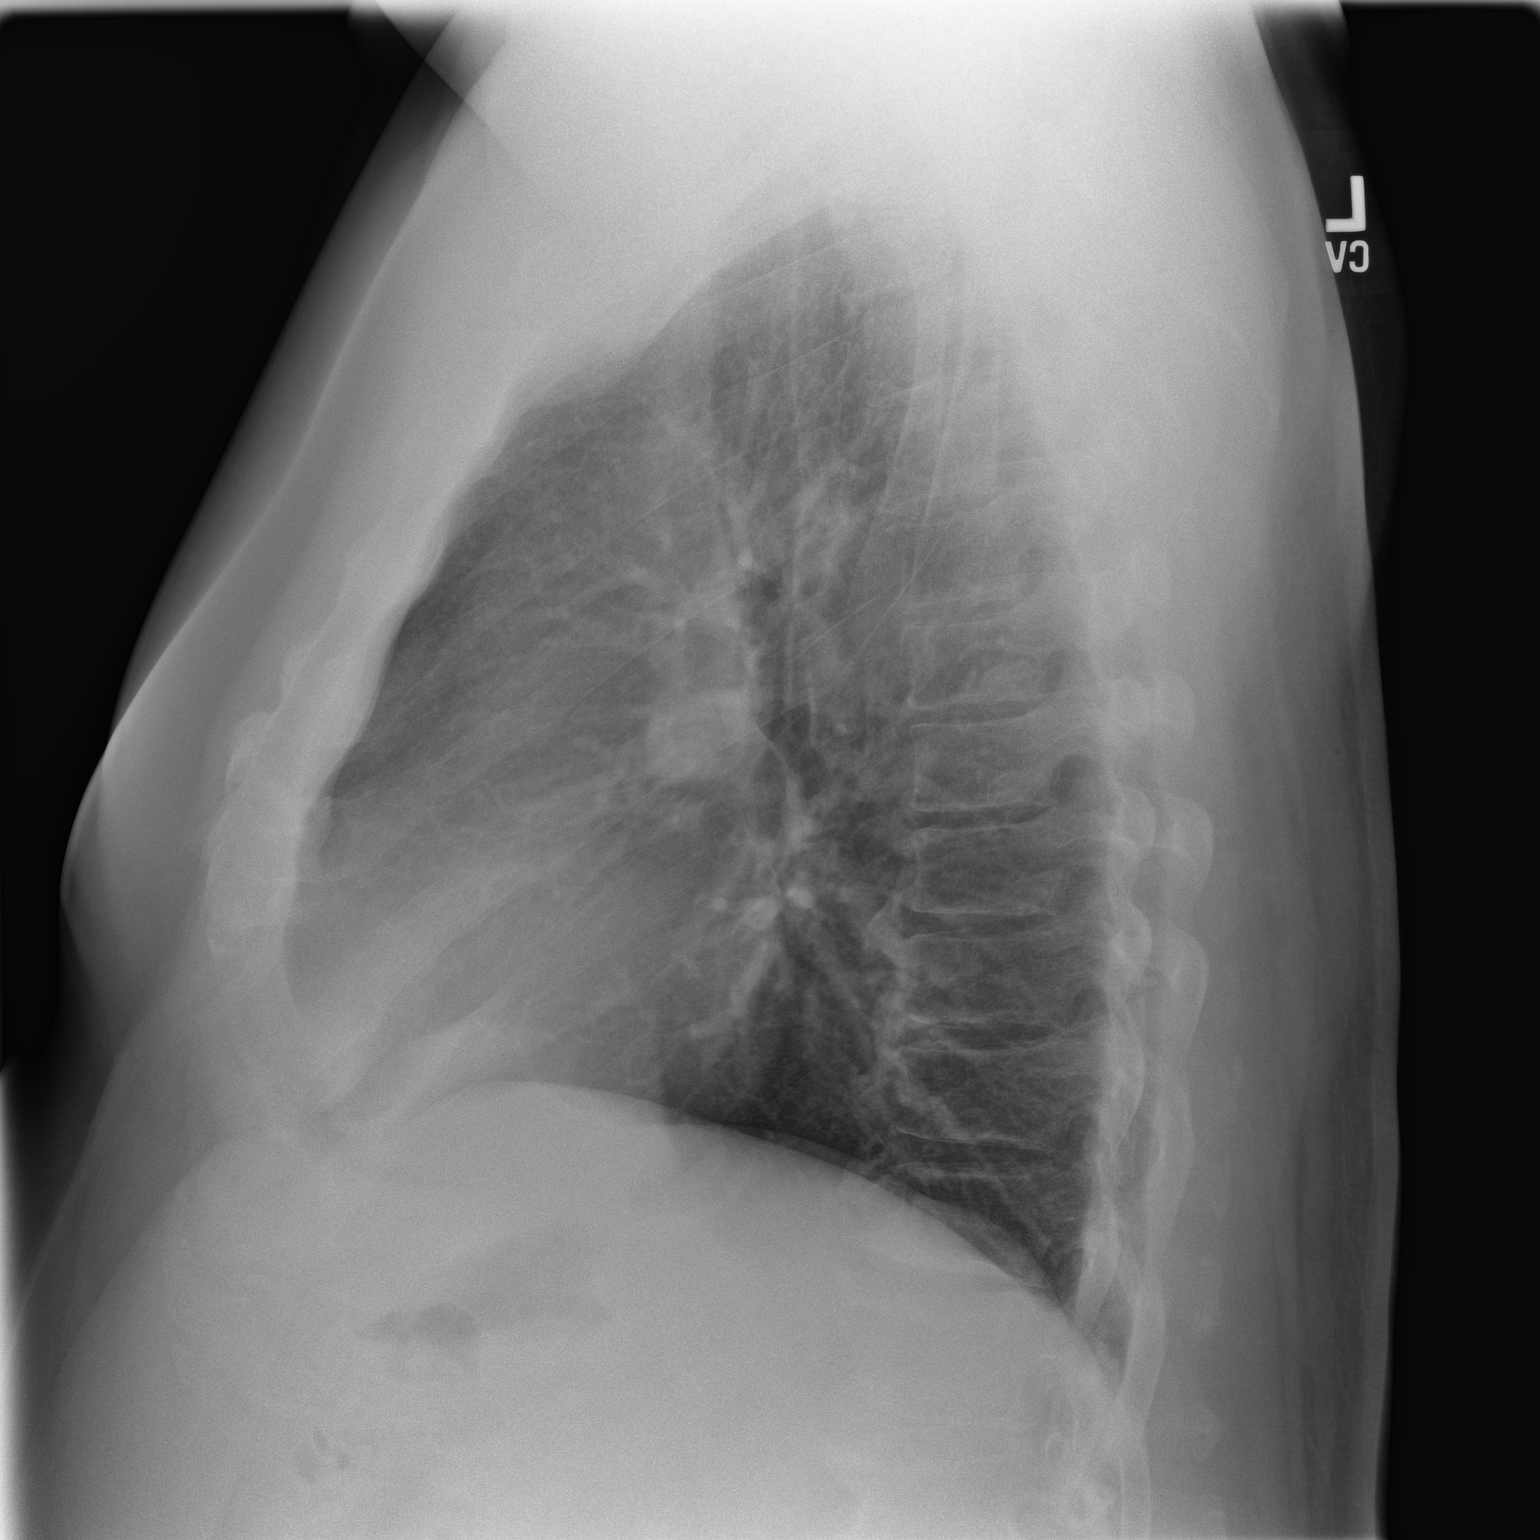

[2 of 2 positions shown; findings below may reference images not displayed]

FINDINGS: No active infiltrate or effusion is seen. Slightly prominent
interstitial and markings are noted which may be chronic in nature.
Mediastinal and hilar contours are unremarkable. The heart is within
normal limits in size. There are degenerative changes noted in the
lower thoracic spine.
IMPRESSION: No active lung disease.  Question mild chronic interstitial change.

## 2016-09-07 ENCOUNTER — Encounter: Payer: Self-pay | Admitting: Family Medicine

## 2016-09-07 ENCOUNTER — Ambulatory Visit (INDEPENDENT_AMBULATORY_CARE_PROVIDER_SITE_OTHER): Payer: BLUE CROSS/BLUE SHIELD | Admitting: Family Medicine

## 2016-09-07 DIAGNOSIS — G8929 Other chronic pain: Secondary | ICD-10-CM | POA: Diagnosis not present

## 2016-09-07 DIAGNOSIS — M545 Low back pain: Secondary | ICD-10-CM

## 2016-09-07 MED ORDER — OXYCODONE-ACETAMINOPHEN 10-325 MG PO TABS: 1.0000 | ORAL_TABLET | Freq: Three times a day (TID) | ORAL | 0 refills | Status: DC | PRN

## 2016-09-07 MED ORDER — OXYCODONE HCL ER 40 MG PO T12A: 40.0000 mg | EXTENDED_RELEASE_TABLET | Freq: Two times a day (BID) | ORAL | 0 refills | Status: DC

## 2016-09-07 NOTE — Progress Notes (Signed)
Name: Lawrence PaulsMarshall R Moyers Jr.   MRN: [id number]    DOB: Oct 16, 1955   Date:09/07/2016       Progress Note  Subjective  Chief Complaint  Chief Complaint  Patient presents with  . Medication Refill    1 month F/U  . Back Pain    Needs Refill    Back Pain  This is a chronic problem. The current episode started today. The problem is unchanged. The pain is present in the lumbar spine. The pain is at a severity of 3/10. The symptoms are aggravated by position. Pertinent negatives include no bladder incontinence, bowel incontinence, leg pain or numbness. He has tried analgesics for the symptoms. The treatment provided significant relief.      Past Medical History:  Diagnosis Date  . Arthritis    oa  . Hypertension     Past Surgical History:  Procedure Laterality Date  . APPENDECTOMY  age 61  . BACK SURGERY  lower back surgery x 3    L 3 L 4 and L 5 are fused together  . JOINT REPLACEMENT    . metal removed from eye  years ago  . sebaceous cyst removed Right 15 years ago   middle  finger   . TOE FUSION Right    4th toe  . TOTAL KNEE ARTHROPLASTY Left 08/10/2014   Procedure: LEFT TOTAL KNEE ARTHROPLASTY;  Surgeon: Eugenia Mcalpineobert Collins, MD;  Location: [facility];  Service: Orthopedics;  Laterality: Left;  . TOTAL KNEE ARTHROPLASTY Right 12/21/2014   Procedure: RIGHT TOTAL KNEE ARTHROPLASTY;  Surgeon: Eugenia Mcalpineobert Collins, MD;  Location: [facility];  Service: Orthopedics;  Laterality: Right;  . TOTAL KNEE ARTHROPLASTY Right 12/21/14    Family History  Problem Relation Age of Onset  . Cancer Mother        Throat  . Hypertension Father   . Hyperlipidemia Father     Social History   Social History  . Marital status: Married    Spouse name: N/A  . Number of children: N/A  . Years of education: N/A   Occupational History  . Not on file.   Social History Main Topics  . Smoking status: Former Smoker    Quit date: 04/07/1999  . Smokeless tobacco: Never Used  . Alcohol use No  . Drug use: No  .  Sexual activity: Yes    Birth control/ protection: Condom   Other Topics Concern  . Not on file   Social History Narrative  . No narrative on file     Current Outpatient Prescriptions:  .  doxazosin (CARDURA) 8 MG tablet, Take 1 tablet (8 mg total) by mouth at bedtime., Disp: 90 tablet, Rfl: 3 .  gemfibrozil (LOPID) 600 MG tablet, take 1 tablet by mouth twice a day before meals, Disp: 180 tablet, Rfl: 0 .  linaclotide (LINZESS) 145 MCG CAPS capsule, Take 1 capsule (145 mcg total) by mouth daily., Disp: 30 capsule, Rfl: 0 .  lisinopril (PRINIVIL,ZESTRIL) 20 MG tablet, Take 1.5 tablets (30 mg total) by mouth at bedtime., Disp: 135 tablet, Rfl: 0 .  oxyCODONE (OXYCONTIN) 40 mg 12 hr tablet, Take 1 tablet (40 mg total) by mouth every 12 (twelve) hours., Disp: 60 tablet, Rfl: 0 .  oxyCODONE-acetaminophen (PERCOCET) 10-325 MG tablet, Take 1 tablet by mouth every 8 (eight) hours as needed for pain., Disp: 90 tablet, Rfl: 0 .  tiZANidine (ZANAFLEX) 4 MG tablet, Take 1 tablet (4 mg total) by mouth at bedtime., Disp: 30 tablet, Rfl: 2  No Known Allergies   Review of Systems  Gastrointestinal: Negative for bowel incontinence.  Genitourinary: Negative for bladder incontinence.  Musculoskeletal: Positive for back pain.  Neurological: Negative for numbness.    Objective  Vitals:   09/07/16 1029  BP: 134/78  Pulse: 74  Resp: 16  Temp: 97.6 F (36.4 C)  TempSrc: Oral  SpO2: 96%  Weight: 263 lb 9.6 oz (119.6 kg)  Height: 6' (1.829 m)    Physical Exam  Constitutional: He is oriented to person, place, and time and well-developed, well-nourished, and in no distress.  HENT:  Head: Normocephalic and atraumatic.  Cardiovascular: Normal rate, regular rhythm, S1 normal, S2 normal and normal heart sounds.   No murmur heard. Pulmonary/Chest: Effort normal and breath sounds normal. He has no wheezes. He has no rhonchi.  Musculoskeletal: He exhibits no edema.       Lumbar back: He exhibits  tenderness. He exhibits no pain and no spasm.       Back:  Neurological: He is alert and oriented to person, place, and time.  Psychiatric: Mood, memory, affect and judgment normal.  Nursing note and vitals reviewed.       Assessment & Plan  1. Chronic right-sided low back pain without sciatica Chronic low back pain stable on opioid therapy, patient compliant with controlled substances agreement. Refills provided - oxyCODONE-acetaminophen (PERCOCET) 10-325 MG tablet; Take 1 tablet by mouth every 8 (eight) hours as needed for pain.  Dispense: 90 tablet; Refill: 0 - oxyCODONE (OXYCONTIN) 40 mg 12 hr tablet; Take 1 tablet (40 mg total) by mouth every 12 (twelve) hours.  Dispense: 60 tablet; Refill: 0   Lawrence Thornton A[facility] 09/07/2016 10:38 AM

## 2016-10-06 ENCOUNTER — Ambulatory Visit (INDEPENDENT_AMBULATORY_CARE_PROVIDER_SITE_OTHER): Payer: BLUE CROSS/BLUE SHIELD | Admitting: Family Medicine

## 2016-10-06 ENCOUNTER — Encounter: Payer: Self-pay | Admitting: Family Medicine

## 2016-10-06 VITALS — BP 136/72 | HR 94 | Temp 98.1°F | Resp 16 | Ht 72.0 in | Wt 261.4 lb

## 2016-10-06 DIAGNOSIS — Z1159 Encounter for screening for other viral diseases: Secondary | ICD-10-CM | POA: Diagnosis not present

## 2016-10-06 DIAGNOSIS — Z0000 Encounter for general adult medical examination without abnormal findings: Secondary | ICD-10-CM | POA: Diagnosis not present

## 2016-10-06 DIAGNOSIS — M545 Low back pain: Secondary | ICD-10-CM | POA: Diagnosis not present

## 2016-10-06 DIAGNOSIS — G8929 Other chronic pain: Secondary | ICD-10-CM

## 2016-10-06 DIAGNOSIS — Z125 Encounter for screening for malignant neoplasm of prostate: Secondary | ICD-10-CM

## 2016-10-06 DIAGNOSIS — R7303 Prediabetes: Secondary | ICD-10-CM

## 2016-10-06 DIAGNOSIS — Z23 Encounter for immunization: Secondary | ICD-10-CM

## 2016-10-06 DIAGNOSIS — Z1211 Encounter for screening for malignant neoplasm of colon: Secondary | ICD-10-CM | POA: Diagnosis not present

## 2016-10-06 LAB — CBC WITH DIFFERENTIAL/PLATELET
BASOS ABS: 63 {cells}/uL (ref 0–200)
Basophils Relative: 1 %
EOS ABS: 189 {cells}/uL (ref 15–500)
Eosinophils Relative: 3 %
HCT: 39.3 % (ref 38.5–50.0)
Hemoglobin: 13.6 g/dL (ref 13.2–17.1)
LYMPHS PCT: 38 %
Lymphs Abs: 2394 cells/uL (ref 850–3900)
MCH: 30.4 pg (ref 27.0–33.0)
MCHC: 34.6 g/dL (ref 32.0–36.0)
MCV: 87.9 fL (ref 80.0–100.0)
MONOS PCT: 10 %
MPV: 9.1 fL (ref 7.5–12.5)
Monocytes Absolute: 630 cells/uL (ref 200–950)
NEUTROS PCT: 48 %
Neutro Abs: 3024 cells/uL (ref 1500–7800)
PLATELETS: 252 10*3/uL (ref 140–400)
RBC: 4.47 MIL/uL (ref 4.20–5.80)
RDW: 13.3 % (ref 11.0–15.0)
WBC: 6.3 10*3/uL (ref 3.8–10.8)

## 2016-10-06 LAB — HEPATITIS C ANTIBODY: HCV Ab: NEGATIVE

## 2016-10-06 LAB — TSH: TSH: 0.65 mIU/L (ref 0.40–4.50)

## 2016-10-06 LAB — POCT GLYCOSYLATED HEMOGLOBIN (HGB A1C): Hemoglobin A1C: 6.6

## 2016-10-06 MED ORDER — OXYCODONE HCL ER 40 MG PO T12A: 40.0000 mg | EXTENDED_RELEASE_TABLET | Freq: Two times a day (BID) | ORAL | 0 refills | Status: DC

## 2016-10-06 MED ORDER — OXYCODONE-ACETAMINOPHEN 10-325 MG PO TABS: 1.0000 | ORAL_TABLET | Freq: Three times a day (TID) | ORAL | 0 refills | Status: DC | PRN

## 2016-10-06 NOTE — Progress Notes (Signed)
Name: Lawrence Thornton.   MRN: [id number]    DOB: 1955/10/27   Date:10/06/2016       Progress Note  Subjective  Chief Complaint  Chief Complaint  Patient presents with  . Annual Exam    CPE    Back Pain  This is a chronic problem. The problem is unchanged. The pain is present in the lumbar spine. The quality of the pain is described as aching. The pain does not radiate. The pain is at a severity of 3/10. The symptoms are aggravated by position. Pertinent negatives include no abdominal pain, bladder incontinence, bowel incontinence, chest pain, fever, headaches, leg pain or numbness. He has tried analgesics for the symptoms. The treatment provided significant relief.    Pt. Presents for a Complete Physical Exam. His last colonoscopy was over 5 years ago, no records available, will order Cologuard.  He is due for Hepatitis C screening, prostate cancer screening.    Past Medical History:  Diagnosis Date  . Arthritis    oa  . Hypertension     Past Surgical History:  Procedure Laterality Date  . APPENDECTOMY  age 88  . BACK SURGERY  lower back surgery x 3    L 3 L 4 and L 5 are fused together  . JOINT REPLACEMENT    . metal removed from eye  years ago  . sebaceous cyst removed Right 15 years ago   middle  finger   . TOE FUSION Right    4th toe  . 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 TOTAL KNEE ARTHROPLASTY Right 12/21/2014   Procedure: RIGHT TOTAL KNEE ARTHROPLASTY;  Surgeon: Eugenia Mcalpine, MD;  Location: [facility];  Service: Orthopedics;  Laterality: Right;  . TOTAL KNEE ARTHROPLASTY Right 12/21/14    Family History  Problem Relation Age of Onset  . Cancer Mother        Throat  . Hypertension Father   . Hyperlipidemia Father     Social History   Social History  . Marital status: Married    Spouse name: N/A  . Number of  children: N/A  . Years of education: N/A   Occupational History  . Not on file.   Social History Main Topics  . Smoking status: Former Smoker    Quit date: 04/07/1999  . Smokeless tobacco: Never Used  . Alcohol use No  . Drug use: No  . Sexual activity: Yes    Birth control/ protection: Condom   Other Topics Concern  . Not on file   Social History Narrative  . No narrative on file     Current Outpatient Prescriptions:  .  doxazosin (CARDURA) 8 MG tablet, Take 1 tablet (8 mg total) by mouth at bedtime., Disp: 90 tablet, Rfl: 3 .  gemfibrozil (LOPID) 600 MG tablet, take 1 tablet by mouth twice a day before meals, Disp: 180 tablet, Rfl: 0 .  linaclotide (LINZESS) 145 MCG CAPS capsule, Take 1 capsule (145 mcg total) by mouth daily., Disp: 30 capsule, Rfl: 0 .  lisinopril (PRINIVIL,ZESTRIL) 20 MG tablet, Take 1.5 tablets (30 mg total) by mouth at bedtime., Disp: 135 tablet, Rfl: 0 .  oxyCODONE (OXYCONTIN) 40 mg 12 hr tablet, Take 1 tablet (40 mg total) by mouth every 12 (twelve) hours., Disp: 60 tablet, Rfl: 0 .  oxyCODONE-acetaminophen (PERCOCET) 10-325 MG tablet, Take 1 tablet by  mouth every 8 (eight) hours as needed for pain., Disp: 90 tablet, Rfl: 0 .  tiZANidine (ZANAFLEX) 4 MG tablet, Take 1 tablet (4 mg total) by mouth at bedtime., Disp: 30 tablet, Rfl: 2  No Known Allergies   Review of Systems  Constitutional: Negative for chills, fever and malaise/fatigue.  HENT: Negative for congestion, ear pain and sore throat.   Eyes: Negative for blurred vision and double vision.  Respiratory: Negative for cough and shortness of breath.   Cardiovascular: Negative for chest pain and leg swelling.  Gastrointestinal: Positive for constipation. Negative for abdominal pain, blood in stool, bowel incontinence, diarrhea, nausea and vomiting.  Genitourinary: Negative for bladder incontinence and hematuria.  Musculoskeletal: Positive for back pain. Negative for joint pain and neck pain.    Neurological: Negative for dizziness, numbness and headaches.  Psychiatric/Behavioral: Negative for depression. The patient is not nervous/anxious and does not have insomnia.       Objective  Vitals:   10/06/16 1424  BP: 136/72  Pulse: 94  Resp: 16  Temp: 98.1 F (36.7 C)  TempSrc: Oral  SpO2: 95%  Weight: 261 lb 6.4 oz (118.6 kg)  Height: 6' (1.829 m)    Physical Exam  Constitutional: He is oriented to person, place, and time and well-developed, well-nourished, and in no distress.  HENT:  Head: Normocephalic and atraumatic.  Right Ear: External ear normal.  Left Ear: External ear normal.  Mouth/Throat: Oropharynx is clear and moist.  Eyes: Conjunctivae are normal. Pupils are equal, round, and reactive to light.  Neck: Neck supple.  Cardiovascular: Normal rate, regular rhythm and normal heart sounds.   No murmur heard. Pulmonary/Chest: Effort normal and breath sounds normal. He has no wheezes.  Abdominal: Soft. Bowel sounds are normal. There is no tenderness.  Genitourinary: Prostate normal. Prostate is not enlarged and not tender.  Musculoskeletal: He exhibits no edema.       Lumbar back: He exhibits tenderness, pain and spasm.       Back:  Neurological: He is alert and oriented to person, place, and time.  Psychiatric: Mood, memory, affect and judgment normal.  Nursing note and vitals reviewed.       Assessment & Plan  1. Annual physical exam Obtain age-appropriate laboratory screening - CBC with Differential/Platelet - VITAMIN D 25 Hydroxy (Vit-D Deficiency, Fractures) - TSH  2. Screening for colon cancer  - Cologuard  3. Screening for prostate cancer  - PSA  4. Need for hepatitis C screening test  - Hepatitis C antibody  5. Chronic right-sided low back pain without sciatica Stable and responsive to opioid therapy, patient compliant with controlled substances agreement. Refills provided - oxyCODONE-acetaminophen (PERCOCET) 10-325 MG tablet;  Take 1 tablet by mouth every 8 (eight) hours as needed for pain.  Dispense: 90 tablet; Refill: 0 - oxyCODONE (OXYCONTIN) 40 mg 12 hr tablet; Take 1 tablet (40 mg total) by mouth every 12 (twelve) hours.  Dispense: 60 tablet; Refill: 0  6. Prediabetes Point-of-care A1c 6.6%, patient is now progressed to full blown diabetes, well controlled, - POCT glycosylated hemoglobin (Hb A1C)  7. Need for shingles vaccine  - Varicella-zoster vaccine IM (Shingrix)   Lawrence Thornton Asad A. Faylene KurtzShah [facility] 10/06/2016 2:33 PM

## 2016-10-07 LAB — PSA: PSA: 0.7 ng/mL (ref <=4.0)

## 2016-10-07 LAB — VITAMIN D 25 HYDROXY (VIT D DEFICIENCY, FRACTURES): Vit D, 25-Hydroxy: 43 ng/mL (ref 30–100)

## 2016-10-08 ENCOUNTER — Telehealth: Payer: Self-pay

## 2016-10-08 NOTE — Telephone Encounter (Signed)
Not able to leave voicemail. Pt box was full.

## 2016-10-08 NOTE — Telephone Encounter (Signed)
-----   Message from Ellyn HackSyed Asad A Shah, MD sent at 10/06/2016  6:15 PM EDT ----- A1c is 6.6%, consistent with well-controlled diabetes mellitus type 2, please schedule for an appointment to discuss the addition of pharmacotherapy

## 2016-10-20 ENCOUNTER — Other Ambulatory Visit: Payer: Self-pay | Admitting: Family Medicine

## 2016-10-20 DIAGNOSIS — R351 Nocturia: Principal | ICD-10-CM

## 2016-10-20 DIAGNOSIS — N401 Enlarged prostate with lower urinary tract symptoms: Secondary | ICD-10-CM

## 2016-10-26 ENCOUNTER — Telehealth: Payer: Self-pay | Admitting: Family Medicine

## 2016-10-26 NOTE — Telephone Encounter (Signed)
Pt states he has received everything for his Colorguard except for the actual kit. Pt would like for that to be sent to him.

## 2016-10-26 NOTE — Telephone Encounter (Signed)
The only thing we do on our in is send in the patient's information along with the order so he will have to contact their office at [phone] to find out why they have not sent him the actual kit.

## 2016-10-28 NOTE — Telephone Encounter (Signed)
Spoke with patient and provided him with the customer service number for cologuard and he will call today

## 2016-11-06 ENCOUNTER — Ambulatory Visit (INDEPENDENT_AMBULATORY_CARE_PROVIDER_SITE_OTHER): Payer: BLUE CROSS/BLUE SHIELD | Admitting: Family Medicine

## 2016-11-06 ENCOUNTER — Encounter: Payer: Self-pay | Admitting: Family Medicine

## 2016-11-06 VITALS — BP 138/67 | HR 80 | Temp 97.8°F | Resp 16 | Ht 72.0 in | Wt 274.6 lb

## 2016-11-06 DIAGNOSIS — M62830 Muscle spasm of back: Secondary | ICD-10-CM

## 2016-11-06 DIAGNOSIS — G8929 Other chronic pain: Secondary | ICD-10-CM | POA: Diagnosis not present

## 2016-11-06 DIAGNOSIS — E781 Pure hyperglyceridemia: Secondary | ICD-10-CM

## 2016-11-06 DIAGNOSIS — M545 Low back pain: Secondary | ICD-10-CM

## 2016-11-06 MED ORDER — OXYCODONE HCL ER 40 MG PO T12A: 40.0000 mg | EXTENDED_RELEASE_TABLET | Freq: Two times a day (BID) | ORAL | 0 refills | Status: DC

## 2016-11-06 MED ORDER — TIZANIDINE HCL 4 MG PO TABS: 4.0000 mg | ORAL_TABLET | Freq: Every day | ORAL | 2 refills | Status: DC

## 2016-11-06 MED ORDER — OXYCODONE-ACETAMINOPHEN 10-325 MG PO TABS: 1.0000 | ORAL_TABLET | Freq: Three times a day (TID) | ORAL | 0 refills | Status: DC | PRN

## 2016-11-06 NOTE — Progress Notes (Signed)
Name: Lawrence PaulsMarshall R Denson Jr.   MRN: [id number]    DOB: [birth year]   Date:11/06/2016       Progress Note  Subjective  Chief Complaint  Chief Complaint  Patient presents with  . Follow-up    1 mo  . Medication Refill    Oxycodone / oxycotin / tizanidine   . Hyperlipidemia    Hyperlipidemia  This is a chronic problem. The problem is controlled. Recent lipid tests were reviewed and are normal. Pertinent negatives include no leg pain, myalgias or shortness of breath. Current antihyperlipidemic treatment includes fibric acid derivatives.  Back Pain  This is a chronic problem. The current episode started today. The problem is unchanged. The pain is present in the lumbar spine. The quality of the pain is described as aching (sometimes sharp pain). The pain is at a severity of 3/10. The symptoms are aggravated by position, bending and sitting (prolonged sitting). Pertinent negatives include no bladder incontinence, bowel incontinence, leg pain or numbness. He has tried analgesics for the symptoms. The treatment provided significant relief.      Past Medical History:  Diagnosis Date  . Arthritis    oa  . Hypertension     Past Surgical History:  Procedure Laterality Date  . APPENDECTOMY  age 61  . BACK SURGERY  lower back surgery x 3    L 3 L 4 and L 5 are fused together  . JOINT REPLACEMENT    . metal removed from eye  years ago  . sebaceous cyst removed Right 15 years ago   middle  finger   . TOE FUSION Right    4th toe  . TOTAL KNEE ARTHROPLASTY Left 08/10/2014   Procedure: LEFT TOTAL KNEE ARTHROPLASTY;  Surgeon: Eugenia Mcalpineobert Collins, MD;  Location: [facility];  Service: Orthopedics;  Laterality: Left;  . TOTAL KNEE ARTHROPLASTY Right 12/21/2014   Procedure: RIGHT TOTAL KNEE ARTHROPLASTY;  Surgeon: Eugenia Mcalpineobert Collins, MD;  Location: [facility];  Service: Orthopedics;  Laterality: Right;  . TOTAL KNEE ARTHROPLASTY Right 12/21/14    Family History  Problem Relation Age of Onset  . Cancer Mother        Throat  . Hypertension Father   . Hyperlipidemia Father     Social History   Social History  . Marital status: Married    Spouse name: N/A  . Number of children: N/A  . Years of education: N/A   Occupational History  . Not on file.   Social History Main Topics  . Smoking status: Former Smoker    Quit date: 04/07/1999  . Smokeless tobacco: Never Used  . Alcohol use No  . Drug use: No  . Sexual activity: Yes    Birth control/ protection: Condom   Other Topics Concern  . Not on file   Social History Narrative  . No narrative on file     Current Outpatient Prescriptions:  .  doxazosin (CARDURA) 8 MG tablet, take 1 tablet by mouth at bedtime, Disp: 90 tablet, Rfl: 3 .  gemfibrozil (LOPID) 600 MG tablet, take 1 tablet by mouth twice a day before meals, Disp: 180 tablet, Rfl: 0 .  linaclotide (LINZESS) 145 MCG CAPS capsule, Take 1 capsule (145 mcg total) by mouth daily., Disp: 30 capsule, Rfl: 0 .  lisinopril (PRINIVIL,ZESTRIL) 20 MG tablet, Take 1.5 tablets (30 mg total) by mouth at bedtime., Disp: 135 tablet, Rfl: 0 .  oxyCODONE (OXYCONTIN) 40 mg 12 hr tablet, Take 1 tablet (40 mg total) by mouth every  12 (twelve) hours., Disp: 60 tablet, Rfl: 0 .  oxyCODONE-acetaminophen (PERCOCET) 10-325 MG tablet, Take 1 tablet by mouth every 8 (eight) hours as needed for pain., Disp: 90 tablet, Rfl: 0 .  tiZANidine (ZANAFLEX) 4 MG tablet, Take 1 tablet (4 mg total) by mouth at bedtime., Disp: 30 tablet, Rfl: 2  No Known Allergies   Review of Systems  Respiratory: Negative for shortness of breath.   Gastrointestinal: Negative for bowel incontinence.  Genitourinary: Negative for bladder incontinence.  Musculoskeletal: Positive for back pain. Negative for myalgias.  Neurological: Negative for numbness.      Objective  Vitals:   11/06/16 0936  BP: 138/67  Pulse: 80  Resp: 16  Temp: 97.8 F (36.6 C)  TempSrc: Oral  SpO2: 96%  Weight: 274 lb 9.6 oz (124.6 kg)  Height: 6'  (1.829 m)    Physical Exam  Constitutional: He is oriented to person, place, and time and well-developed, well-nourished, and in no distress.  HENT:  Head: Normocephalic and atraumatic.  Cardiovascular: Normal rate, regular rhythm and normal heart sounds.   No murmur heard. Pulmonary/Chest: Effort normal and breath sounds normal. He has no wheezes.  Musculoskeletal:       Lumbar back: He exhibits tenderness and pain.       Back:  Neurological: He is alert and oriented to person, place, and time.  Psychiatric: Mood, memory, affect and judgment normal.  Nursing note and vitals reviewed.     Assessment & Plan  1. Chronic right-sided low back pain without sciatica Stable and responsive to opioid treatment, patient compliant with controlled substances agreement and understands the dependence potential, side effects and drug interactions of opioids. Refills provided - oxyCODONE (OXYCONTIN) 40 mg 12 hr tablet; Take 1 tablet (40 mg total) by mouth every 12 (twelve) hours.  Dispense: 60 tablet; Refill: 0 - oxyCODONE-acetaminophen (PERCOCET) 10-325 MG tablet; Take 1 tablet by mouth every 8 (eight) hours as needed for pain.  Dispense: 90 tablet; Refill: 0  2. Spasm of back muscles  - tiZANidine (ZANAFLEX) 4 MG tablet; Take 1 tablet (4 mg total) by mouth at bedtime.  Dispense: 30 tablet; Refill: 2  3. Hypertriglyceridemia Continue on gemfibrozil, repeat lipid panel - Lipid panel   Nikkole Placzek Asad A. Faylene KurtzShah [facility] 11/06/2016 9:49 AM

## 2016-11-20 LAB — LIPID PANEL
CHOL/HDL RATIO: 2.7 ratio (ref <5.0)
CHOLESTEROL: 126 mg/dL (ref <200)
HDL: 46 mg/dL (ref >40)
LDL Cholesterol: 56 mg/dL (ref <100)
TRIGLYCERIDES: 118 mg/dL (ref <150)
VLDL: 24 mg/dL (ref <30)

## 2016-12-08 ENCOUNTER — Ambulatory Visit (INDEPENDENT_AMBULATORY_CARE_PROVIDER_SITE_OTHER): Payer: BLUE CROSS/BLUE SHIELD | Admitting: Family Medicine

## 2016-12-08 ENCOUNTER — Encounter: Payer: Self-pay | Admitting: Family Medicine

## 2016-12-08 DIAGNOSIS — G8929 Other chronic pain: Secondary | ICD-10-CM

## 2016-12-08 DIAGNOSIS — M545 Low back pain: Secondary | ICD-10-CM | POA: Diagnosis not present

## 2016-12-08 MED ORDER — OXYCODONE HCL ER 40 MG PO T12A: 40.0000 mg | EXTENDED_RELEASE_TABLET | Freq: Two times a day (BID) | ORAL | 0 refills | Status: DC

## 2016-12-08 MED ORDER — OXYCODONE-ACETAMINOPHEN 10-325 MG PO TABS: 1.0000 | ORAL_TABLET | Freq: Three times a day (TID) | ORAL | 0 refills | Status: DC | PRN

## 2016-12-08 NOTE — Progress Notes (Signed)
Name: Lawrence PaulsMarshall R Hardacre Jr.   MRN: [id number]    DOB: 08-22-1955   Date:12/08/2016       Progress Note  Subjective  Chief Complaint  Chief Complaint  Patient presents with  . Follow-up    1 mo  . Medication Refill    Back Pain  This is a chronic problem. The current episode started today. The problem is unchanged. The pain is present in the lumbar spine. The quality of the pain is described as aching (sometimes sharp pain). The pain does not radiate. The pain is at a severity of 3/10. The symptoms are aggravated by position, bending and sitting (prolonged sitting). Pertinent negatives include no bladder incontinence, bowel incontinence or numbness. He has tried analgesics for the symptoms. The treatment provided significant relief.     Past Medical History:  Diagnosis Date  . Arthritis    oa  . Hypertension     Past Surgical History:  Procedure Laterality Date  . APPENDECTOMY  age 61  . BACK SURGERY  lower back surgery x 3    L 3 L 4 and L 5 are fused together  . JOINT REPLACEMENT    . metal removed from eye  years ago  . sebaceous cyst removed Right 15 years ago   middle  finger   . TOE FUSION Right    4th toe  . TOTAL KNEE ARTHROPLASTY Left 08/10/2014   Procedure: LEFT TOTAL KNEE ARTHROPLASTY;  Surgeon: Eugenia Mcalpineobert Collins, MD;  Location: [facility];  Service: Orthopedics;  Laterality: Left;  . TOTAL KNEE ARTHROPLASTY Right 12/21/2014   Procedure: RIGHT TOTAL KNEE ARTHROPLASTY;  Surgeon: Eugenia Mcalpineobert Collins, MD;  Location: [facility];  Service: Orthopedics;  Laterality: Right;  . TOTAL KNEE ARTHROPLASTY Right 12/21/14    Family History  Problem Relation Age of Onset  . Cancer Mother        Throat  . Hypertension Father   . Hyperlipidemia Father     Social History   Social History  . Marital status: Married    Spouse name: N/A  . Number of children: N/A  . Years of education: N/A   Occupational History  . Not on file.   Social History Main Topics  . Smoking status: Former  Smoker    Quit date: 04/07/1999  . Smokeless tobacco: Never Used  . Alcohol use No  . Drug use: No  . Sexual activity: Yes    Birth control/ protection: Condom   Other Topics Concern  . Not on file   Social History Narrative  . No narrative on file     Current Outpatient Prescriptions:  .  doxazosin (CARDURA) 8 MG tablet, take 1 tablet by mouth at bedtime, Disp: 90 tablet, Rfl: 3 .  gemfibrozil (LOPID) 600 MG tablet, take 1 tablet by mouth twice a day before meals, Disp: 180 tablet, Rfl: 0 .  linaclotide (LINZESS) 145 MCG CAPS capsule, Take 1 capsule (145 mcg total) by mouth daily., Disp: 30 capsule, Rfl: 0 .  lisinopril (PRINIVIL,ZESTRIL) 20 MG tablet, Take 1.5 tablets (30 mg total) by mouth at bedtime., Disp: 135 tablet, Rfl: 0 .  oxyCODONE (OXYCONTIN) 40 mg 12 hr tablet, Take 1 tablet (40 mg total) by mouth every 12 (twelve) hours., Disp: 60 tablet, Rfl: 0 .  oxyCODONE-acetaminophen (PERCOCET) 10-325 MG tablet, Take 1 tablet by mouth every 8 (eight) hours as needed for pain., Disp: 90 tablet, Rfl: 0 .  tiZANidine (ZANAFLEX) 4 MG tablet, Take 1 tablet (4 mg total) by  mouth at bedtime., Disp: 30 tablet, Rfl: 2  No Known Allergies   Review of Systems  Gastrointestinal: Negative for bowel incontinence.  Genitourinary: Negative for bladder incontinence.  Musculoskeletal: Positive for back pain.  Neurological: Negative for numbness.      Objective  Vitals:   12/08/16 1328  BP: 136/67  Pulse: 79  Resp: 16  Temp: 98.6 F (37 C)  TempSrc: Oral  SpO2: 95%  Weight: 278 lb 6.4 oz (126.3 kg)  Height: 6' (1.829 m)    Physical Exam  Constitutional: He is oriented to person, place, and time and well-developed, well-nourished, and in no distress.  HENT:  Head: Normocephalic and atraumatic.  Cardiovascular: Normal rate, regular rhythm and normal heart sounds.   No murmur heard. Pulmonary/Chest: Effort normal and breath sounds normal. He has no wheezes.  Musculoskeletal:        Lumbar back: He exhibits tenderness and pain.       Back:  Neurological: He is alert and oriented to person, place, and time.  Psychiatric: Mood, memory, affect and judgment normal.  Nursing note and vitals reviewed.     Assessment & Plan  1. Chronic right-sided low back pain without sciatica Stable and responsive to opioid treatment, patient compliant with controlled substances agreement and understands the dependence potential, side effects and drug interactions of opioids. Refills provided - oxyCODONE (OXYCONTIN) 40 mg 12 hr tablet; Take 1 tablet (40 mg total) by mouth every 12 (twelve) hours.  Dispense: 60 tablet; Refill: 0 - oxyCODONE-acetaminophen (PERCOCET) 10-325 MG tablet; Take 1 tablet by mouth every 8 (eight) hours as needed for pain.  Dispense: 90 tablet; Refill: 0   Elva Breaker Asad A. Faylene Kurtz [facility] 12/08/2016 1:49 PM

## 2017-01-07 ENCOUNTER — Encounter: Payer: Self-pay | Admitting: Family Medicine

## 2017-01-07 ENCOUNTER — Ambulatory Visit (INDEPENDENT_AMBULATORY_CARE_PROVIDER_SITE_OTHER): Payer: BLUE CROSS/BLUE SHIELD | Admitting: Family Medicine

## 2017-01-07 VITALS — BP 134/72 | HR 82 | Ht 72.0 in | Wt 275.9 lb

## 2017-01-07 DIAGNOSIS — E119 Type 2 diabetes mellitus without complications: Secondary | ICD-10-CM

## 2017-01-07 DIAGNOSIS — I10 Essential (primary) hypertension: Secondary | ICD-10-CM

## 2017-01-07 DIAGNOSIS — G8929 Other chronic pain: Secondary | ICD-10-CM | POA: Diagnosis not present

## 2017-01-07 DIAGNOSIS — E785 Hyperlipidemia, unspecified: Secondary | ICD-10-CM | POA: Diagnosis not present

## 2017-01-07 DIAGNOSIS — Z23 Encounter for immunization: Secondary | ICD-10-CM

## 2017-01-07 DIAGNOSIS — M545 Low back pain: Secondary | ICD-10-CM | POA: Diagnosis not present

## 2017-01-07 LAB — POCT GLYCOSYLATED HEMOGLOBIN (HGB A1C): HEMOGLOBIN A1C: 6

## 2017-01-07 MED ORDER — OXYCODONE HCL ER 40 MG PO T12A: 40.0000 mg | EXTENDED_RELEASE_TABLET | Freq: Two times a day (BID) | ORAL | 0 refills | Status: DC

## 2017-01-07 MED ORDER — LISINOPRIL 20 MG PO TABS: 30.0000 mg | ORAL_TABLET | Freq: Every day | ORAL | 0 refills | Status: DC

## 2017-01-07 MED ORDER — OXYCODONE-ACETAMINOPHEN 10-325 MG PO TABS: 1.0000 | ORAL_TABLET | Freq: Three times a day (TID) | ORAL | 0 refills | Status: DC | PRN

## 2017-01-07 MED ORDER — GEMFIBROZIL 600 MG PO TABS: ORAL_TABLET | ORAL | 0 refills | Status: DC

## 2017-01-07 NOTE — Progress Notes (Signed)
Name: Lawrence Thornton.   MRN: [id number]    DOB: [birth year]   Date:01/07/2017       Progress Note  Subjective  Chief Complaint  Chief Complaint  Patient presents with  . Diabetes  . Immunizations    flu vaccine given today     Diabetes  He presents for his follow-up diabetic visit. He has type 2 diabetes mellitus. His disease course has been stable. There are no hypoglycemic associated symptoms. Pertinent negatives for hypoglycemia include no headaches. Pertinent negatives for diabetes include no blurred vision, no chest pain, no fatigue, no foot paresthesias, no polydipsia and no polyuria. Symptoms are stable. Pertinent negatives for diabetic complications include no CVA, heart disease or peripheral neuropathy. Current diabetic treatment includes diet. His weight is stable. He is following a generally healthy diet. He participates in exercise daily (walks about 1 mile every day). Frequency home blood tests: does not check his blood glucose.  Hypertension  This is a chronic problem. The problem is unchanged. The problem is controlled. Pertinent negatives include no blurred vision, chest pain, headaches or palpitations. Past treatments include ACE inhibitors. There is no history of kidney disease, CAD/MI or CVA.  Hyperlipidemia  This is a chronic problem. The problem is uncontrolled. Recent lipid tests were reviewed and are normal. Pertinent negatives include no chest pain. Current antihyperlipidemic treatment includes fibric acid derivatives. Risk factors for coronary artery disease include diabetes mellitus, obesity and male sex.  Back Pain  This is a chronic problem. The problem is unchanged. The pain is present in the lumbar spine. The pain does not radiate. The pain is at a severity of 4/10. The symptoms are aggravated by bending, position and standing (has been working on his house at [employer]). Pertinent negatives include no chest pain or headaches.      Past Medical History:    Diagnosis Date  . Arthritis    oa  . Hypertension     Past Surgical History:  Procedure Laterality Date  . APPENDECTOMY  age 61  . BACK SURGERY  lower back surgery x 3    L 3 L 4 and L 5 are fused together  . JOINT REPLACEMENT    . metal removed from eye  years ago  . sebaceous cyst removed Right 15 years ago   middle  finger   . TOE FUSION Right    4th toe  . 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 TOTAL KNEE ARTHROPLASTY Right 12/21/2014   Procedure: RIGHT TOTAL KNEE ARTHROPLASTY;  Surgeon: Eugenia Mcalpine, MD;  Location: [facility];  Service: Orthopedics;  Laterality: Right;  . TOTAL KNEE ARTHROPLASTY Right 12/21/14    Family History  Problem Relation Age of Onset  . Cancer Mother        Throat  . Hypertension Father   . Hyperlipidemia Father     Social History   Social History  . Marital status: Married    Spouse name: N/A  . Number of children: N/A  . Years of education: N/A   Occupational History  . Not on file.   Social History Main Topics  . Smoking status: Former Smoker    Quit date: 04/07/1999  . Smokeless tobacco: Never Used  . Alcohol use No  . Drug use: No  . Sexual activity: Yes    Birth control/ protection: Condom  Other Topics Concern  . Not on file   Social History Narrative  . No narrative on file     Current Outpatient Prescriptions:  .  gemfibrozil (LOPID) 600 MG tablet, take 1 tablet by mouth twice a day before meals, Disp: 180 tablet, Rfl: 0 .  linaclotide (LINZESS) 145 MCG CAPS capsule, Take 1 capsule (145 mcg total) by mouth daily., Disp: 30 capsule, Rfl: 0 .  lisinopril (PRINIVIL,ZESTRIL) 20 MG tablet, Take 1.5 tablets (30 mg total) by mouth at bedtime., Disp: 135 tablet, Rfl: 0 .  oxyCODONE (OXYCONTIN) 40 mg 12 hr tablet, Take 1 tablet (40 mg total) by mouth every 12 (twelve) hours., Disp: 60 tablet,  Rfl: 0 .  oxyCODONE-acetaminophen (PERCOCET) 10-325 MG tablet, Take 1 tablet by mouth every 8 (eight) hours as needed for pain., Disp: 90 tablet, Rfl: 0 .  doxazosin (CARDURA) 8 MG tablet, take 1 tablet by mouth at bedtime (Patient not taking: Reported on 01/07/2017), Disp: 90 tablet, Rfl: 3 .  tiZANidine (ZANAFLEX) 4 MG tablet, Take 1 tablet (4 mg total) by mouth at bedtime. (Patient not taking: Reported on 01/07/2017), Disp: 30 tablet, Rfl: 2  No Known Allergies   Review of Systems  Constitutional: Negative for fatigue.  Eyes: Negative for blurred vision.  Cardiovascular: Negative for chest pain and palpitations.  Musculoskeletal: Positive for back pain.  Neurological: Negative for headaches.  Endo/Heme/Allergies: Negative for polydipsia.     Objective  Vitals:   01/07/17 0951  BP: 134/72  Pulse: 82  SpO2: 98%  Weight: 275 lb 14.4 oz (125.1 kg)  Height: 6' (1.829 m)    Physical Exam  Constitutional: He is oriented to person, place, and time and well-developed, well-nourished, and in no distress.  HENT:  Head: Normocephalic and atraumatic.  Cardiovascular: Normal rate, regular rhythm, S1 normal, S2 normal and normal heart sounds.   No murmur heard. Pulmonary/Chest: Effort normal and breath sounds normal. He has no wheezes. He has no rhonchi.  Abdominal: Soft. Bowel sounds are normal. There is no tenderness.  Musculoskeletal: He exhibits no edema.       Lumbar back: He exhibits tenderness. He exhibits no pain and no spasm.       Back:  Neurological: He is alert and oriented to person, place, and time.  Psychiatric: Mood, memory, affect and judgment normal.  Nursing note and vitals reviewed.      Recent Results (from the past 2160 hour(s))  Lipid panel     Status: None   Collection Time: 11/19/16  8:33 AM  Result Value Ref Range   Cholesterol 126 <200 mg/dL   Triglycerides 130 <865 mg/dL   HDL 46 >78 mg/dL   Total CHOL/HDL Ratio 2.7 <5.0 Ratio   VLDL 24 <30 mg/dL    LDL Cholesterol 56 <469 mg/dL  POCT glycosylated hemoglobin (Hb A1C)     Status: Normal   Collection Time: 01/07/17  9:56 AM  Result Value Ref Range   Hemoglobin A1C 6.0      Assessment & Plan  1. Flu vaccine need  - Flu Vaccine QUAD 36+ mos IM  2. Type 2 diabetes mellitus without complication, without long-term current use of insulin (HCC) A1c is 6.0%, well-controlled diabetes, no indication for pharmacotherapy. - POCT glycosylated hemoglobin (Hb A1C)  3. Essential hypertension BP 7 present at [place] as a treatment - lisinopril (PRINIVIL,ZESTRIL) 20 MG tablet; Take 1.5 tablets (30 mg total) by mouth at bedtime.  Dispense: 135 tablet; Refill: 0  4. Dyslipidemia FLP at  goal consider starting on statin - gemfibrozil (LOPID) 600 MG tablet; take 1 tablet by mouth twice a day before meals  Dispense: 180 tablet; Refill: 0  5. Chronic right-sided low back pain without sciatica Stable, responsive to opioid treatment, patient compliant with controlled substance agreement, understanding the potential side effects of opioids, refills provided and follow-up in one month - oxyCODONE-acetaminophen (PERCOCET) 10-325 MG tablet; Take 1 tablet by mouth every 8 (eight) hours as needed for pain.  Dispense: 90 tablet; Refill: 0 - oxyCODONE (OXYCONTIN) 40 mg 12 hr tablet; Take 1 tablet (40 mg total) by mouth every 12 (twelve) hours.  Dispense: 60 tablet; Refill: 0   Hailei Besser Asad [facility] 01/07/2017 10:05 AM

## 2017-02-05 ENCOUNTER — Ambulatory Visit (INDEPENDENT_AMBULATORY_CARE_PROVIDER_SITE_OTHER): Payer: BLUE CROSS/BLUE SHIELD | Admitting: Family Medicine

## 2017-02-05 ENCOUNTER — Encounter: Payer: Self-pay | Admitting: Family Medicine

## 2017-02-05 VITALS — BP 132/74 | HR 90 | Temp 98.3°F | Resp 14 | Ht 72.0 in | Wt 276.7 lb

## 2017-02-05 DIAGNOSIS — Z1211 Encounter for screening for malignant neoplasm of colon: Secondary | ICD-10-CM | POA: Diagnosis not present

## 2017-02-05 DIAGNOSIS — M545 Low back pain: Secondary | ICD-10-CM

## 2017-02-05 DIAGNOSIS — G8929 Other chronic pain: Secondary | ICD-10-CM

## 2017-02-05 MED ORDER — OXYCODONE HCL ER 40 MG PO T12A: 40.0000 mg | EXTENDED_RELEASE_TABLET | Freq: Two times a day (BID) | ORAL | 0 refills | Status: DC

## 2017-02-05 MED ORDER — OXYCODONE-ACETAMINOPHEN 10-325 MG PO TABS: 1.0000 | ORAL_TABLET | Freq: Three times a day (TID) | ORAL | 0 refills | Status: DC | PRN

## 2017-02-05 NOTE — Progress Notes (Signed)
The following letter was provided to Lawrence Thornton. during their visit today: ---------------------------------------  Dear valued [facility] Patient,  I am writing to share that as of May 21, 2017, I will no longer be seeing patients at [facility]. While it has been my privilege to care for you as a physician, I have decided to move outside of [place] to pursue other opportunities.  The staff at [facility] has been supportive of my decision and are supportive of any patients who have been under my care.  They will be happy to provide care to you and your family.  The office staff will do everything they can to ensure a seamless transition of care at [facility].  However if you are on any controlled substance medications (i.e. Pain medication or benzodiazepines), they will no longer be able to refill those medications, but we will be more than happy to refer you to a specialist.  [facility] will also assist you with the transfer of medical records should you wish to seek care elsewhere.  If you have any questions about your future care, you may call the office at [phone].  I have enjoyed getting to know my patients here and I wish you the very best.  Sincerely,  Velta AddisonSyed Asad Shah, MD  ---------------------------------------  A written copy of this letter was given to the patient.  The patient verbalizes understanding of the letter, and does request referral to specialist or to new primary care provider.  This decision has been conveyed to Dr. Sherryll BurgerShah, who will place any appropriate referrals during today's visit.

## 2017-02-05 NOTE — Progress Notes (Signed)
Name: Lawrence Thornton.   MRN: [id number]    DOB: April 05, 1956   Date:02/05/2017       Progress Note  Subjective  Chief Complaint  Chief Complaint  Patient presents with  . Medication Refill    pain meds    Back Pain  This is a chronic problem. The current episode started today. The problem is unchanged. The pain is present in the lumbar spine. The quality of the pain is described as aching (sometimes sharp pain). The pain does not radiate. The pain is at a severity of 4/10. The pain is moderate. The symptoms are aggravated by position, bending and sitting (prolonged sitting). Pertinent negatives include no bladder incontinence, bowel incontinence or numbness. He has tried analgesics for the symptoms. The treatment provided significant relief.     Past Medical History:  Diagnosis Date  . Arthritis    oa  . Hypertension     Past Surgical History:  Procedure Laterality Date  . APPENDECTOMY  age 61  . BACK SURGERY  lower back surgery x 3    L 3 L 4 and L 5 are fused together  . JOINT REPLACEMENT    . metal removed from eye  years ago  . sebaceous cyst removed Right 15 years ago   middle  finger   . TOE FUSION Right    4th toe  . TOTAL KNEE ARTHROPLASTY Left 08/10/2014   Procedure: LEFT TOTAL KNEE ARTHROPLASTY;  Surgeon: Sydnee Cabal, MD;  Location: [facility];  Service: Orthopedics;  Laterality: Left;  . TOTAL KNEE ARTHROPLASTY Right 12/21/2014   Procedure: RIGHT TOTAL KNEE ARTHROPLASTY;  Surgeon: Sydnee Cabal, MD;  Location: [facility];  Service: Orthopedics;  Laterality: Right;  . TOTAL KNEE ARTHROPLASTY Right 12/21/14    Family History  Problem Relation Age of Onset  . Cancer Mother        Throat  . Hypertension Father   . Hyperlipidemia Father     Social History   Social History  . Marital status: Married    Spouse name: N/A  . Number of children: N/A  . Years of education: N/A   Occupational History  . Not on file.   Social History Main Topics  . Smoking  status: Former Smoker    Quit date: 04/07/1999  . Smokeless tobacco: Never Used  . Alcohol use No  . Drug use: No  . Sexual activity: Yes    Birth control/ protection: Condom   Other Topics Concern  . Not on file   Social History Narrative  . No narrative on file     Current Outpatient Prescriptions:  .  doxazosin (CARDURA) 8 MG tablet, take 1 tablet by mouth at bedtime, Disp: 90 tablet, Rfl: 3 .  gemfibrozil (LOPID) 600 MG tablet, take 1 tablet by mouth twice a day before meals, Disp: 180 tablet, Rfl: 0 .  linaclotide (LINZESS) 145 MCG CAPS capsule, Take 1 capsule (145 mcg total) by mouth daily., Disp: 30 capsule, Rfl: 0 .  lisinopril (PRINIVIL,ZESTRIL) 20 MG tablet, Take 1.5 tablets (30 mg total) by mouth at bedtime., Disp: 135 tablet, Rfl: 0 .  oxyCODONE (OXYCONTIN) 40 mg 12 hr tablet, Take 1 tablet (40 mg total) by mouth every 12 (twelve) hours., Disp: 60 tablet, Rfl: 0 .  oxyCODONE-acetaminophen (PERCOCET) 10-325 MG tablet, Take 1 tablet by mouth every 8 (eight) hours as needed for pain., Disp: 90 tablet, Rfl: 0 .  tiZANidine (ZANAFLEX) 4 MG tablet, Take 1 tablet (4 mg total)  by mouth at bedtime., Disp: 30 tablet, Rfl: 2  No Known Allergies   Review of Systems  Gastrointestinal: Negative for bowel incontinence.  Genitourinary: Negative for bladder incontinence.  Musculoskeletal: Positive for back pain.  Neurological: Negative for numbness.     Objective  Vitals:   02/05/17 1057  BP: 132/74  Pulse: 90  Resp: 14  Temp: 98.3 F (36.8 C)  TempSrc: Oral  SpO2: 96%  Weight: 276 lb 11.2 oz (125.5 kg)  Height: 6' (1.829 m)    Physical Exam  Constitutional: He is oriented to person, place, and time and well-developed, well-nourished, and in no distress.  HENT:  Head: Normocephalic and atraumatic.  Cardiovascular: Normal rate, regular rhythm and normal heart sounds.   No murmur heard. Pulmonary/Chest: Effort normal and breath sounds normal. He has no wheezes.    Musculoskeletal:       Lumbar back: He exhibits tenderness and pain.       Back:  Neurological: He is alert and oriented to person, place, and time.  Psychiatric: Mood, memory, affect and judgment normal.  Nursing note and vitals reviewed.    Recent Results (from the past 2160 hour(s))  Lipid panel     Status: None   Collection Time: 11/19/16  8:33 AM  Result Value Ref Range   Cholesterol 126 <200 mg/dL   Triglycerides 118 <150 mg/dL   HDL 46 >40 mg/dL   Total CHOL/HDL Ratio 2.7 <5.0 Ratio   VLDL 24 <30 mg/dL   LDL Cholesterol 56 <100 mg/dL  POCT glycosylated hemoglobin (Hb A1C)     Status: Normal   Collection Time: 01/07/17  9:56 AM  Result Value Ref Range   Hemoglobin A1C 6.0      Assessment & Plan  1. Chronic right-sided low back pain without sciatica Chronic low back pain, stable on opioid therapy, compliant with controlled substances agreement, advised that after my departure from the practice, he will need to follow up with pain Clinic and he verbalized agreement. - Ambulatory referral to Pain Clinic - oxyCODONE (OXYCONTIN) 40 mg 12 hr tablet; Take 1 tablet (40 mg total) by mouth every 12 (twelve) hours.  Dispense: 60 tablet; Refill: 0 - oxyCODONE-acetaminophen (PERCOCET) 10-325 MG tablet; Take 1 tablet by mouth every 8 (eight) hours as needed for pain.  Dispense: 90 tablet; Refill: 0  2. Screening for colon cancer Pt. has not received Cologuard kit, would like to get referral for colonoscopy.  - Ambulatory referral to Gastroenterology  Dossie Der [facility]ad [facility] 02/05/2017 11:08 AM

## 2017-03-04 ENCOUNTER — Ambulatory Visit (INDEPENDENT_AMBULATORY_CARE_PROVIDER_SITE_OTHER): Payer: BLUE CROSS/BLUE SHIELD | Admitting: Family Medicine

## 2017-03-04 ENCOUNTER — Encounter: Payer: Self-pay | Admitting: Family Medicine

## 2017-03-04 VITALS — BP 158/76 | HR 95 | Temp 98.0°F | Resp 14 | Ht 72.0 in | Wt 283.3 lb

## 2017-03-04 DIAGNOSIS — M545 Low back pain, unspecified: Secondary | ICD-10-CM

## 2017-03-04 DIAGNOSIS — M62830 Muscle spasm of back: Secondary | ICD-10-CM

## 2017-03-04 DIAGNOSIS — G8929 Other chronic pain: Secondary | ICD-10-CM | POA: Diagnosis not present

## 2017-03-04 DIAGNOSIS — I10 Essential (primary) hypertension: Secondary | ICD-10-CM | POA: Diagnosis not present

## 2017-03-04 MED ORDER — TIZANIDINE HCL 4 MG PO TABS: 4.0000 mg | ORAL_TABLET | Freq: Every day | ORAL | 2 refills | Status: DC

## 2017-03-04 MED ORDER — OXYCODONE HCL ER 40 MG PO T12A: 40.0000 mg | EXTENDED_RELEASE_TABLET | Freq: Two times a day (BID) | ORAL | 0 refills | Status: DC

## 2017-03-04 MED ORDER — OXYCODONE-ACETAMINOPHEN 10-325 MG PO TABS: 1.0000 | ORAL_TABLET | Freq: Three times a day (TID) | ORAL | 0 refills | Status: DC | PRN

## 2017-03-04 NOTE — Progress Notes (Signed)
Name: Lawrence Thornton.   MRN: [id number]    DOB: Dec 10, 1955   Date:03/04/2017       Progress Note  Subjective  Chief Complaint  Chief Complaint  Patient presents with  . discuss medicaitons  . forms to fill out  . Medication Refill    Back Pain  This is a chronic problem. The problem is unchanged. The pain is present in the lumbar spine. The quality of the pain is described as aching (sometimes sharp pain). The pain does not radiate. The pain is at a severity of 4/10. The symptoms are aggravated by position, bending, sitting and standing (prolonged sitting and standing makes it worse.). Pertinent negatives include no bladder incontinence, bowel incontinence, leg pain or numbness. He has tried analgesics for the symptoms. The treatment provided significant relief.  Patient is scheduled to see pain management next week for initial visit, he is requesting a prescription for 1 month to be able to establish care with pain clinic to start appropriate initial evaluation and workup.  Past Medical History:  Diagnosis Date  . Arthritis    oa  . Hypertension     Past Surgical History:  Procedure Laterality Date  . APPENDECTOMY  age 61  . BACK SURGERY  lower back surgery x 3    L 3 L 4 and L 5 are fused together  . JOINT REPLACEMENT    . metal removed from eye  years ago  . sebaceous cyst removed Right 15 years ago   middle  finger   . TOE FUSION Right    4th toe  . TOTAL KNEE ARTHROPLASTY Left 08/10/2014   Procedure: LEFT TOTAL KNEE ARTHROPLASTY;  Surgeon: Eugenia Mcalpineobert Collins, MD;  Location: [facility];  Service: Orthopedics;  Laterality: Left;  . TOTAL KNEE ARTHROPLASTY Right 12/21/2014   Procedure: RIGHT TOTAL KNEE ARTHROPLASTY;  Surgeon: Eugenia Mcalpineobert Collins, MD;  Location: [facility];  Service: Orthopedics;  Laterality: Right;  . TOTAL KNEE ARTHROPLASTY Right 12/21/14    Family History  Problem Relation Age of Onset  . Cancer Mother        Throat  . Hypertension Father   . Hyperlipidemia  Father     Social History   Socioeconomic History  . Marital status: Married    Spouse name: Not on file  . Number of children: Not on file  . Years of education: Not on file  . Highest education level: Not on file  Social Needs  . Financial resource strain: Not on file  . Food insecurity - worry: Not on file  . Food insecurity - inability: Not on file  . Transportation needs - medical: Not on file  . Transportation needs - non-medical: Not on file  Occupational History  . Not on file  Tobacco Use  . Smoking status: Former Smoker    Last attempt to quit: 04/07/1999    Years since quitting: 17.9  . Smokeless tobacco: Never Used  Substance and Sexual Activity  . Alcohol use: No  . Drug use: No  . Sexual activity: Yes    Birth control/protection: Condom  Other Topics Concern  . Not on file  Social History Narrative  . Not on file     Current Outpatient Medications:  .  doxazosin (CARDURA) 8 MG tablet, take 1 tablet by mouth at bedtime, Disp: 90 tablet, Rfl: 3 .  gemfibrozil (LOPID) 600 MG tablet, take 1 tablet by mouth twice a day before meals, Disp: 180 tablet, Rfl: 0 .  linaclotide (  LINZESS) 145 MCG CAPS capsule, Take 1 capsule (145 mcg total) by mouth daily., Disp: 30 capsule, Rfl: 0 .  lisinopril (PRINIVIL,ZESTRIL) 20 MG tablet, Take 1.5 tablets (30 mg total) by mouth at bedtime., Disp: 135 tablet, Rfl: 0 .  oxyCODONE (OXYCONTIN) 40 mg 12 hr tablet, Take 1 tablet (40 mg total) by mouth every 12 (twelve) hours., Disp: 60 tablet, Rfl: 0 .  oxyCODONE-acetaminophen (PERCOCET) 10-325 MG tablet, Take 1 tablet by mouth every 8 (eight) hours as needed for pain., Disp: 90 tablet, Rfl: 0 .  tiZANidine (ZANAFLEX) 4 MG tablet, Take 1 tablet (4 mg total) by mouth at bedtime., Disp: 30 tablet, Rfl: 2  No Known Allergies   Review of Systems  Gastrointestinal: Negative for bowel incontinence.  Genitourinary: Negative for bladder incontinence.  Musculoskeletal: Positive for back  pain.  Neurological: Negative for numbness.      Objective  Vitals:   03/04/17 1143  BP: (!) 158/76  Pulse: 95  Resp: 14  Temp: 98 F (36.7 C)  TempSrc: Oral  SpO2: 94%  Weight: 283 lb 4.8 oz (128.5 kg)  Height: 6' (1.829 m)    Physical Exam  Constitutional: He is oriented to person, place, and time and well-developed, well-nourished, and in no distress.  HENT:  Head: Normocephalic and atraumatic.  Cardiovascular: Normal rate, regular rhythm and normal heart sounds.  No murmur heard. Pulmonary/Chest: Effort normal and breath sounds normal. He has no wheezes.  Musculoskeletal:       Lumbar back: He exhibits tenderness, pain and spasm.       Back:  Neurological: He is alert and oriented to person, place, and time.  Nursing note and vitals reviewed.     Recent Results (from the past 2160 hour(s))  POCT glycosylated hemoglobin (Hb A1C)     Status: Normal   Collection Time: 01/07/17  9:56 AM  Result Value Ref Range   Hemoglobin A1C 6.0      Assessment & Plan  1. Chronic right-sided low back pain without sciati symptoms responsive to opioid treatment, compliant with controlled substances agreement and is scheduled to see pain clinic next week, refills provided - oxyCODONE (OXYCONTIN) 40 mg 12 hr tablet; Take 1 tablet (40 mg total) by mouth every 12 (twelve) hours.  Dispense: 60 tablet; Refill: 0 - oxyCODONE-acetaminophen (PERCOCET) 10-325 MG tablet; Take 1 tablet by mouth every 8 (eight) hours as needed for pain.  Dispense: 90 tablet; Refill: 0  2. Spasm of back muscles  - tiZANidine (ZANAFLEX) 4 MG tablet; Take 1 tablet (4 mg total) by mouth at bedtime.  Dispense: 30 tablet; Refill: 2  3. Essential hypertension Elevated blood pressure, advise to check blood pressure regularly, follow up in one month  [name] Asad A. [facility] 03/04/2017 12:02 PM

## 2017-03-11 ENCOUNTER — Other Ambulatory Visit: Payer: Self-pay

## 2017-03-11 ENCOUNTER — Ambulatory Visit
Payer: BLUE CROSS/BLUE SHIELD | Attending: Student in an Organized Health Care Education/Training Program | Admitting: Student in an Organized Health Care Education/Training Program

## 2017-03-11 ENCOUNTER — Encounter: Payer: Self-pay | Admitting: Student in an Organized Health Care Education/Training Program

## 2017-03-11 VITALS — BP 143/77 | HR 86 | Temp 98.2°F | Resp 20 | Ht 72.0 in | Wt 277.0 lb

## 2017-03-11 DIAGNOSIS — E669 Obesity, unspecified: Secondary | ICD-10-CM | POA: Diagnosis not present

## 2017-03-11 DIAGNOSIS — M545 Low back pain: Secondary | ICD-10-CM | POA: Diagnosis present

## 2017-03-11 DIAGNOSIS — Z87442 Personal history of urinary calculi: Secondary | ICD-10-CM | POA: Insufficient documentation

## 2017-03-11 DIAGNOSIS — Z96652 Presence of left artificial knee joint: Secondary | ICD-10-CM | POA: Insufficient documentation

## 2017-03-11 DIAGNOSIS — Z79891 Long term (current) use of opiate analgesic: Secondary | ICD-10-CM | POA: Diagnosis not present

## 2017-03-11 DIAGNOSIS — R079 Chest pain, unspecified: Secondary | ICD-10-CM | POA: Diagnosis not present

## 2017-03-11 DIAGNOSIS — E079 Disorder of thyroid, unspecified: Secondary | ICD-10-CM | POA: Insufficient documentation

## 2017-03-11 DIAGNOSIS — E781 Pure hyperglyceridemia: Secondary | ICD-10-CM | POA: Diagnosis not present

## 2017-03-11 DIAGNOSIS — M5136 Other intervertebral disc degeneration, lumbar region: Secondary | ICD-10-CM | POA: Insufficient documentation

## 2017-03-11 DIAGNOSIS — I129 Hypertensive chronic kidney disease with stage 1 through stage 4 chronic kidney disease, or unspecified chronic kidney disease: Secondary | ICD-10-CM | POA: Diagnosis not present

## 2017-03-11 DIAGNOSIS — Z86010 Personal history of colonic polyps: Secondary | ICD-10-CM | POA: Diagnosis not present

## 2017-03-11 DIAGNOSIS — K5909 Other constipation: Secondary | ICD-10-CM | POA: Insufficient documentation

## 2017-03-11 DIAGNOSIS — Z981 Arthrodesis status: Secondary | ICD-10-CM

## 2017-03-11 DIAGNOSIS — N189 Chronic kidney disease, unspecified: Secondary | ICD-10-CM | POA: Diagnosis not present

## 2017-03-11 DIAGNOSIS — E785 Hyperlipidemia, unspecified: Secondary | ICD-10-CM | POA: Diagnosis not present

## 2017-03-11 DIAGNOSIS — N401 Enlarged prostate with lower urinary tract symptoms: Secondary | ICD-10-CM | POA: Insufficient documentation

## 2017-03-11 DIAGNOSIS — M1712 Unilateral primary osteoarthritis, left knee: Secondary | ICD-10-CM | POA: Insufficient documentation

## 2017-03-11 DIAGNOSIS — G894 Chronic pain syndrome: Secondary | ICD-10-CM | POA: Insufficient documentation

## 2017-03-11 NOTE — Progress Notes (Signed)
Patient's Name: Lawrence Thornton.  MRN: [id number]  Referring Provider: Roselee Nova, MD  DOB: 15-Apr-1955  PCP: Roselee Nova, MD  DOS: 03/11/2017  Note by: Gillis Santa, MD  Service setting: Ambulatory outpatient  Specialty: Interventional Pain Management  Location: [facility] (AMB) Pain Management Facility  Visit type: Initial Patient Evaluation  Patient type: New Patient   Primary Reason(s) for Visit: Encounter for initial evaluation of one or more chronic problems (new to examiner) potentially causing chronic pain, and posing a threat to normal musculoskeletal function. (Level of risk: High) CC: Back Pain (low and right)  HPI  Lawrence Thornton is a 61 y.o. year old, male patient, who comes today to see [place] for the first time for an initial evaluation of his chronic pain. He has Primary osteoarthritis of left knee; Status post total left knee replacement using cement; S/P knee surgery; Chronic low back pain; BPH associated with nocturia; Dyslipidemia; Continuous opioid dependence ([place]); Chronic LBP; Chronic constipation; Benign fibroma of prostate; DDD (degenerative disc disease), lumbar; Failure of erection; BP (high blood pressure); Long term current use of opiate analgesic; S/P knee replacement; Osteoarthritis of right knee; Gonalgia; History of colon polyps; Hypertriglyceridemia; Need for prophylactic vaccination and inoculation against influenza; Congested; Extreme obesity; Preop examination; Screening for gout; Need for vaccination with Comvax; Constipation due to opioid therapy; Hyperglycemia; and Chest pain on their problem list. Today he comes in for evaluation of his Back Pain (low and right)  Pain Assessment: Location: Lower, Right Back Radiating: denies Onset: More than a month ago Duration: Chronic pain Quality: Aching, Sharp, Dull, Burning Severity: 3 /10 (self-reported pain score)  Note: Reported level is compatible with observation.                         When using our  objective Pain Scale, levels between 6 and 10/10 are said to belong in an emergency room, as it progressively worsens from a 6/10, described as severely limiting, requiring emergency care not usually available at an outpatient pain management facility. At a 6/10 level, communication becomes difficult and requires great effort. Assistance to reach the emergency department may be required. Facial flushing and profuse sweating along with potentially dangerous increases in heart rate and blood pressure will be evident. Effect on ADL:   Timing: Constant Modifying factors: medications, walking, ice  Onset and Duration: Gradual Cause of pain: 2009 DDD Severity: No change since onset Timing: unanswered Aggravating Factors: Bending, Lifiting, Prolonged sitting, Prolonged standing and Twisting Alleviating Factors: Cold packs, Medications and Walking Associated Problems: Constipation, Numbness, Spasms, Weakness and Pain that wakes patient up Quality of Pain: Burning, Dull and Sharp Previous Examinations or Tests: Discogram, MRI scan, Myelogram, Neurosurgical evaluation, Orthopedic evaluation and Psychiatric evaluation Previous Treatments: Epidural steroid injections, Narcotic medications and Steroid treatments by mouth  The patient comes into the clinics today for the first time for a chronic pain management evaluation.   61 year old male who seen as a referral from Dr. Manuella Ghazi, his primary care physician for his chronic pain that is localized primarily to his axial lumbar spine and intermittent pain in his bilateral knees.  Patient has a history of discectomy in 1997 and revision discectomy in 1998 revised to a posterior lumbar fusion in 1999. Patient was previously under the care of Dr. Burman Riis at the [facility] spine center in 1999 which resulted in L3-S1 fusion.  Plain film radiographs from December 2014 showed fracture of bilateral spinal rods from  his fusion from L3-S1.  He saw orthopedic spine surgery, Dr. Gerome Apley  regarding his spinal rod fractures which was not thought to be contributing to his worsening low back pain.  Surgery was not recommended initially but if conservative management failed, Dr. Gerome Apley discussed with the patient that major reconstructive surgery would be needed including osteotomy which would require about 1 year long recovery.  In regards to his knees, patient has had knee replacement surgery in 2016.  Lawrence Thornton is tried various medications including gabapentin which was not effective.  Resulted and we are thoughts and nightmares.  He is currently on OxyContin 40 mg twice daily and Percocet 10 mg 3 times daily as needed.  Patient is also tried meloxicam 7.5 mg twice daily.  He stopped due to shortness of breath he uses Zanaflex occasionally for his muscle spasms.  He usually uses Zanaflex once daily.  Patient is also tried physical therapy in the past.  He has had cortisone injections in his back for worsening back pain which was somewhat effective.  He states that he does exercises at home.  Patient is a Armed forces operational officer and states that these medications allow him to function.  He states that he has never run out of his medications early.  Patient has seen Dr. Jeneen Rinks at [facility] pain medicine.  There is a psychological assessment done with Dr.Dinoff  which did not reveal any concerns for substance abuse disorder.  The patient was also made aware of my Comprehensive Pain Management Safety Guidelines where by joining my practice, they limit all of their nerve blocks and joint injections to those done by our practice, for as long as we are retained to manage their care.   Historic Controlled Substance Pharmacotherapy Review  PMP and historical list of controlled substances: Percocet 10 mg 3 times daily as needed for breakthrough, OxyContin 40 mg twice daily. MME/day: Percocet equals 45, OxyContin equals 120 mg/day total equals 165 Medications: The patient did not bring the medication(s) to the  appointment, as requested in our "New Patient Package" Pharmacodynamics: Desired effects: Analgesia: The patient reports >50% benefit. Reported improvement in function: The patient reports medication allows him to accomplish basic ADLs. Clinically meaningful improvement in function (CMIF): Sustained CMIF goals met Perceived effectiveness: Described as relatively effective, allowing for increase in activities of daily living (ADL) Undesirable effects: Side-effects or Adverse reactions: None reported Historical Monitoring: The patient  reports that he does not use drugs. List of all UDS Test(s): Lab Results  Component Value Date   PCPQUANT Negative 07/19/2015   List of other Serum/Urine Drug Screening Test(s):  Lab Results  Component Value Date   PCPQUANT Negative 07/19/2015   Historical Background Evaluation: [place] PMP: Six (6) year initial data search conducted.             PMP NARX Score Report:  Narcotic: 412 Sedative: 160 Stimulant: 0 [place] Department of public safety, offender search: Editor, commissioning Information) Non-contributory Risk Assessment Profile: Aberrant behavior: None observed or detected today Risk factors for fatal opioid overdose: None identified today PMP NARX Overdose Risk Score: 280 Fatal overdose hazard ratio (HR): 2.04 for doses equal to, or higher than 100 MME/day Non-fatal overdose hazard ratio (HR): 8.87 for 100-199 MME/day Risk of opioid abuse or dependence: 0.7-3.0% with doses ? 36 MME/day and 6.1-26% with doses ? 120 MME/day. Substance use disorder (SUD) risk level: Low See psychological assessment done at [facility] on December 30, 2015. Opioid risk tool (ORT) (Total Score): 0 Opioid Risk Tool -  03/11/17 0823      Family History of Substance Abuse   Alcohol  Negative    Illegal Drugs  Negative    Rx Drugs  Negative      Personal History of Substance Abuse   Alcohol  Negative    Illegal Drugs  Negative    Rx Drugs  Negative      Age   Age between 89-45 years    No      History of Preadolescent Sexual Abuse   History of Preadolescent Sexual Abuse  Negative or Male      Psychological Disease   Psychological Disease  Negative    Depression  Negative      Total Score   Opioid Risk Tool Scoring  0    Opioid Risk Interpretation  Low Risk      ORT Scoring interpretation table:  Score <3 = Low Risk for SUD  Score between 4-7 = Moderate Risk for SUD  Score >8 = High Risk for Opioid Abuse    Pharmacologic Plan: None at this point.            Initial impression: No immediate contraindications found.  Meds   Current Outpatient Medications:  .  doxazosin (CARDURA) 8 MG tablet, take 1 tablet by mouth at bedtime, Disp: 90 tablet, Rfl: 3 .  gemfibrozil (LOPID) 600 MG tablet, take 1 tablet by mouth twice a day before meals, Disp: 180 tablet, Rfl: 0 .  linaclotide (LINZESS) 145 MCG CAPS capsule, Take 1 capsule (145 mcg total) by mouth daily., Disp: 30 capsule, Rfl: 0 .  lisinopril (PRINIVIL,ZESTRIL) 20 MG tablet, Take 1.5 tablets (30 mg total) by mouth at bedtime., Disp: 135 tablet, Rfl: 0 .  oxyCODONE (OXYCONTIN) 40 mg 12 hr tablet, Take 1 tablet (40 mg total) by mouth every 12 (twelve) hours., Disp: 60 tablet, Rfl: 0 .  oxyCODONE-acetaminophen (PERCOCET) 10-325 MG tablet, Take 1 tablet by mouth every 8 (eight) hours as needed for pain., Disp: 90 tablet, Rfl: 0 .  tiZANidine (ZANAFLEX) 4 MG tablet, Take 1 tablet (4 mg total) by mouth at bedtime., Disp: 30 tablet, Rfl: 2  Imaging Review   LUMBAR SPINE - 2-3 VIEW   COMPARISON:  None.   FINDINGS:  There are 5 nonrib bearing lumbar-type vertebral bodies. The  vertebral body heights are maintained. The alignment is anatomic.  There is no spondylolysis. There is no acute fracture or static  listhesis.   There is posterior spinal fusion with osseous fusion across the  posterior elements of L3-4, L4-5 and L5-S1. There is orthopedic  hardware transfixing the L3, L4 and S1 vertebral bodies. There  are  bilateral pedicle screws at L3, L4 and S1. There is fracture of the  vertical rod just inferior to the L4 pedicle screw bilaterally.   The SI joints are unremarkable.   IMPRESSION:  There are bilateral pedicle screws at L3, L4 and S1. There is  fracture of the vertical rod just inferior to the L4 pedicle screw  bilaterally.   No acute osseous injury of the lumbar spine.     Complexity Note: Imaging results reviewed. Results shared with Lawrence Thornton, using Layman's terms.                         ROS  Cardiovascular History: High blood pressure and Needs antibiotics prior to dental procedures Pulmonary or Respiratory History: No reported pulmonary signs or symptoms such as wheezing and difficulty taking a  deep full breath (Asthma), difficulty blowing air out (Emphysema), coughing up mucus (Bronchitis), persistent dry cough, or temporary stoppage of breathing during sleep Neurological History: No reported neurological signs or symptoms such as seizures, abnormal skin sensations, urinary and/or fecal incontinence, being born with an abnormal open spine and/or a tethered spinal cord Review of Past Neurological Studies: No results found for this or any previous visit. Psychological-Psychiatric History: No reported psychological or psychiatric signs or symptoms such as difficulty sleeping, anxiety, depression, delusions or hallucinations (schizophrenial), mood swings (bipolar disorders) or suicidal ideations or attempts Gastrointestinal History: Irregular, infrequent bowel movements (Constipation) Genitourinary History: No reported renal or genitourinary signs or symptoms such as difficulty voiding or producing urine, peeing blood, non-functioning kidney, kidney stones, difficulty emptying the bladder, difficulty controlling the flow of urine, or chronic kidney disease Hematological History: No reported hematological signs or symptoms such as prolonged bleeding, low or poor functioning  platelets, bruising or bleeding easily, hereditary bleeding problems, low energy levels due to low hemoglobin or being anemic Endocrine History: No reported endocrine signs or symptoms such as high or low blood sugar, rapid heart rate due to high thyroid levels, obesity or weight gain due to slow thyroid or thyroid disease Rheumatologic History: Joint aches and or swelling due to excess weight (Osteoarthritis) Musculoskeletal History: Negative for myasthenia gravis, muscular dystrophy, multiple sclerosis or malignant hyperthermia Work History: Working full time  Allergies  Lawrence Thornton has No Known Allergies.  Laboratory Chemistry  Inflammation Markers (CRP: Acute Phase) (ESR: Chronic Phase) No results found for: CRP, ESRSEDRATE, LATICACIDVEN               Rheumatology Markers No results found for: RF, ANA, Therisa [place], [facility]              Renal Function Markers Lab Results  Component Value Date   BUN 15 11/11/2015   CREATININE 0.78 11/11/2015   GFRAA 114 11/11/2015   GFRNONAA 99 11/11/2015                 Hepatic Function Markers Lab Results  Component Value Date   AST 18 11/11/2015   ALT 27 11/11/2015   ALBUMIN 4.6 11/11/2015   ALKPHOS 76 11/11/2015   HCVAB NEGATIVE 10/06/2016                 Electrolytes Lab Results  Component Value Date   NA 141 11/11/2015   K 5.1 11/11/2015   CL 100 11/11/2015   CALCIUM 10.0 11/11/2015                 Neuropathy Markers Lab Results  Component Value Date   HGBA1C 6.0 01/07/2017                 Bone Pathology Markers Lab Results  Component Value Date   VD25OH 43 10/06/2016                 Coagulation Parameters Lab Results  Component Value Date   INR 1.07 12/13/2014   LABPROT 14.1 12/13/2014   APTT 34 12/13/2014   PLT 252 10/06/2016                 Cardiovascular Markers Lab Results  Component Value Date   HGB 13.6 10/06/2016   HCT 39.3 10/06/2016                 CA Markers No results  found for: CEA, CA125, LABCA2  Note: Lab results reviewed.  [place]  Drug: Lawrence Thornton  reports that he does not use drugs. Alcohol:  reports that he does not drink alcohol. Tobacco:  reports that he quit smoking about 17 years ago. he has never used smokeless tobacco. Medical:  has a past medical history of Arthritis and Hypertension. Family: family history includes Cancer in his mother; Hyperlipidemia in his father; Hypertension in his father.  Past Surgical History:  Procedure Laterality Date  . APPENDECTOMY  age 61  . BACK SURGERY  lower back surgery x 3    L 3 L 4 and L 5 are fused together  . JOINT REPLACEMENT    . LUMBAR FUSION  1999   L- to L5  . metal removed from eye  years ago  . sebaceous cyst removed Right 15 years ago   middle  finger   . TOE FUSION Right    4th toe  . TOTAL KNEE ARTHROPLASTY Left 08/10/2014   Procedure: LEFT TOTAL KNEE ARTHROPLASTY;  Surgeon: Sydnee Cabal, MD;  Location: [facility];  Service: Orthopedics;  Laterality: Left;  . TOTAL KNEE ARTHROPLASTY Right 12/21/2014   Procedure: RIGHT TOTAL KNEE ARTHROPLASTY;  Surgeon: Sydnee Cabal, MD;  Location: [facility];  Service: Orthopedics;  Laterality: Right;  . TOTAL KNEE ARTHROPLASTY Right 12/21/14   Active Ambulatory Problems    Diagnosis Date Noted  . Primary osteoarthritis of left knee 08/10/2014  . Status post total left knee replacement using cement 08/10/2014  . S/P knee surgery 08/10/2014  . Chronic low back pain 09/21/2014  . BPH associated with nocturia 09/21/2014  . Dyslipidemia 09/21/2014  . Continuous opioid dependence ([place]) 09/21/2014  . Chronic LBP 09/21/2014  . Chronic constipation 09/21/2014  . Benign fibroma of prostate 09/21/2014  . DDD (degenerative disc disease), lumbar 09/21/2014  . Failure of erection 09/21/2014  . BP (high blood pressure) 09/21/2014  . Long term current use of opiate analgesic 09/21/2014  . S/P knee replacement 12/21/2014  . Osteoarthritis of right  knee 12/21/2014  . Gonalgia 01/22/2015  . History of colon polyps 01/22/2015  . Hypertriglyceridemia 01/22/2015  . Need for prophylactic vaccination and inoculation against influenza 01/22/2015  . Congested 01/22/2015  . Extreme obesity 01/22/2015  . Preop examination 01/22/2015  . Screening for gout 01/22/2015  . Need for vaccination with Comvax 01/22/2015  . Constipation due to opioid therapy 07/18/2015  . Hyperglycemia 09/16/2015  . Chest pain 10/10/2015   Resolved Ambulatory Problems    Diagnosis Date Noted  . Pharyngitis 10/22/2014  . Sinus infection 01/22/2015   Past Medical History:  Diagnosis Date  . Arthritis   . Hypertension    Constitutional Exam  General appearance: Well nourished, well developed, and well hydrated. In no apparent acute distress Vitals:   03/11/17 0817  BP: (!) 143/77  Pulse: 86  Resp: 20  Temp: 98.2 F (36.8 C)  TempSrc: Oral  SpO2: 98%  Weight: 277 lb (125.6 kg)  Height: 6' (1.829 m)   BMI Assessment: Estimated body mass index is 37.57 kg/m as calculated from the following:   Height as of this encounter: 6' (1.829 m).   Weight as of this encounter: 277 lb (125.6 kg).  BMI interpretation table: BMI level Category Range association with higher incidence of chronic pain  <18 kg/m2 Underweight   18.5-24.9 kg/m2 Ideal body weight   25-29.9 kg/m2 Overweight Increased incidence by 20%  30-34.9 kg/m2 Obese (Class I) Increased incidence by 68%  35-39.9 kg/m2 Severe obesity (Class II)  Increased incidence by 136%  >40 kg/m2 Extreme obesity (Class III) Increased incidence by 254%   BMI Readings from Last 4 Encounters:  03/11/17 37.57 kg/m  03/04/17 38.42 kg/m  02/05/17 37.53 kg/m  01/07/17 37.42 kg/m   Wt Readings from Last 4 Encounters:  03/11/17 277 lb (125.6 kg)  03/04/17 283 lb 4.8 oz (128.5 kg)  02/05/17 276 lb 11.2 oz (125.5 kg)  01/07/17 275 lb 14.4 oz (125.1 kg)  Psych/Mental status: Alert, oriented x 3 (person, place, &  time)       Eyes: PERLA Respiratory: No evidence of acute respiratory distress  Cervical Spine Area Exam  Skin & Axial Inspection: No masses, redness, edema, swelling, or associated skin lesions Alignment: Symmetrical Functional ROM: Unrestricted ROM      Stability: No instability detected Muscle Tone/Strength: Functionally intact. No obvious neuro-muscular anomalies detected. Sensory (Neurological): Unimpaired Palpation: No palpable anomalies              Upper Extremity (UE) Exam    Side: Right upper extremity  Side: Left upper extremity  Skin & Extremity Inspection: Skin color, temperature, and hair growth are WNL. No peripheral edema or cyanosis. No masses, redness, swelling, asymmetry, or associated skin lesions. No contractures.  Skin & Extremity Inspection: Skin color, temperature, and hair growth are WNL. No peripheral edema or cyanosis. No masses, redness, swelling, asymmetry, or associated skin lesions. No contractures.  Functional ROM: Unrestricted ROM          Functional ROM: Unrestricted ROM          Muscle Tone/Strength: Functionally intact. No obvious neuro-muscular anomalies detected.  Muscle Tone/Strength: Functionally intact. No obvious neuro-muscular anomalies detected.  Sensory (Neurological): Unimpaired          Sensory (Neurological): Unimpaired          Palpation: No palpable anomalies              Palpation: No palpable anomalies              Specialized Test(s): Deferred         Specialized Test(s): Deferred          Thoracic Spine Area Exam  Skin & Axial Inspection: No masses, redness, or swelling Alignment: Symmetrical Functional ROM: Unrestricted ROM Stability: No instability detected Muscle Tone/Strength: Functionally intact. No obvious neuro-muscular anomalies detected. Sensory (Neurological): Unimpaired Muscle strength & Tone: No palpable anomalies  Lumbar Spine Area Exam  Skin & Axial Inspection: Well healed scar from previous spine surgery  detected Alignment: Symmetrical Functional ROM: Decreased ROM     With lumbar extension Stability: No instability detected Muscle Tone/Strength: Functionally intact. No obvious neuro-muscular anomalies detected. Sensory (Neurological): Unimpaired Palpation: Complains of area being tender to palpation       Provocative Tests: Lumbar Hyperextension and rotation test: Positive bilaterally for facet joint pain. Lumbar Lateral bending test: evaluation deferred today       Patrick's Maneuver: Positive bilaterally for SI joint arthralgia.                    Gait & Posture Assessment  Ambulation: Unassisted Gait: Relatively normal for age and body habitus Posture: WNL   Lower Extremity Exam    Side: Right lower extremity  Side: Left lower extremity  Skin & Extremity Inspection: Skin color, temperature, and hair growth are WNL. No peripheral edema or cyanosis. No masses, redness, swelling, asymmetry, or associated skin lesions. No contractures.  Skin & Extremity Inspection: Skin color, temperature, and hair  growth are WNL. No peripheral edema or cyanosis. No masses, redness, swelling, asymmetry, or associated skin lesions. No contractures.  Functional ROM: Unrestricted ROM          Functional ROM: Unrestricted ROM          Muscle Tone/Strength: Functionally intact. No obvious neuro-muscular anomalies detected.  Muscle Tone/Strength: Functionally intact. No obvious neuro-muscular anomalies detected.  Sensory (Neurological): Unimpaired  Sensory (Neurological): Unimpaired  Palpation: No palpable anomalies  Palpation: No palpable anomalies   Assessment  Primary Diagnosis & Pertinent Problem List: The primary encounter diagnosis was Chronic pain syndrome. Diagnoses of Chronic use of opiate for therapeutic purpose, S/P lumbar fusion, and Lumbar degenerative disc disease were also pertinent to this visit.  Visit Diagnosis (New problems to examiner): 1. Chronic pain syndrome   2. Chronic use of opiate  for therapeutic purpose   3. S/P lumbar fusion   4. Lumbar degenerative disc disease     61 year old male who has been on chronic opioid therapy for greater than 10 years being referred from his primary care physician for chronic pain related to his axial lumbar spine and bilateral knees.  Patient's primary pain is in his lower back.  Patient has an impressive lumbar surgical history which includes an L3-S1 posterior lumbar fusion with a sequelae of rod fracture that is been evaluated by orthopedic surgeon and surgery has not been recommended given how extensive reconstruction would be.  Patient has tried cortisone injections, physical therapy, non-opioid analgesics including meloxicam which resulted in shortness of breath, gabapentin which resulted in vivid dreams and impaired cognition, Lyrica which resulted in leg swelling, and various muscle relaxants only of which tizanidine is somewhat effective.  Patient has been stable on his chronic opioid regimen for approximately 8 years which includes OxyContin 40 mg twice daily, Percocet 10 mg 3 times daily as needed for breakthrough pain.  I had an extensive discussion about opiate-induced hyperalgesia and his total morphine milliequivalents.  While patient has been stable on this dose for many years, I would like to wean his total opioid dose down.  Patient states that he finds greater benefit with the long-acting OxyContin so I discussed weaning down his Percocet for breakthrough pain.  Patient utilizes 10 mg 3 times daily as needed, pending appropriate urine drug screen, will agree to take the patient on for his medication management with the understanding that we will be weaning his total opioid dose.  Ideally I would like to get him down to 100-120 morphine milliequivalents.  Patient is currently at 165 morphine milliequivalents.  Patient does have an extensive lumbar surgery along with bilateral knee replacements, works, and is able to function with this  medication regimen so I believe it is effective for his well-being and overall functional status to be on opioid therapy of some sort..  Also had a discussion with the patient about spinal cord stimulation.  The patient's pain is largely axial rather than appendicular.  For this reason we will defer spinal cord stimulation workup at this time but will consider it in the future if his axial low back pain worsens especially in the context of his opioid wean or if he develops radicular pain.  Plan: -Urine drug screen today.  Expect this to be positive for oxycodone and its metabolites. -Follow-up in 2-3 weeks.  Pending urine drug screen, will take over opioid medication management with the understanding that we will be weaning his short acting Percocet from 3 times daily to twice daily as needed. -  Continue tizanidine as prescribed -We will discuss adding additional neuropathic agents including Cymbalta or tricyclic antidepressant at next visit as we start opioid wean.  Ordered Lab-work, Procedure(s), Referral(s), & Consult(s): Orders Placed This Encounter  Procedures  . Compliance Drug Analysis, Ur   Pharmacotherapy (current): Medications ordered:  No orders of the defined types were placed in this encounter.  Medications administered during this visit: Georg Thornton. Huey Romans. had no medications administered during this visit.   Pharmacological management options:  Opioid Analgesics: The patient was informed that there is no guarantee that he would be a candidate for opioid analgesics. The decision will be made following CDC guidelines. This decision will be based on the results of diagnostic studies, as well as Mr. Lasala's risk profile.   Membrane stabilizer: Gabapentin: Vivid dreams, nightmares.  Lyrica: Leg swelling, nausea.  Can consider Cymbalta, venlafaxine, tricyclic antidepressant  Muscle relaxant: Currently on tizanidine and finds that somewhat effective  NSAID: Ibuprofen and  naproxen in the past.  Also tried meloxicam but resulted in shortness of breath.  Other analgesic(s): Consider topical agents including compounded ketamine cream.   Interventional management options: Lawrence Thornton was informed that there is no guarantee that he would be a candidate for interventional therapies. The decision will be based on the results of diagnostic studies, as well as Lawrence Thornton's risk profile.  Procedure(s) under consideration:  Bilateral SI joint injection.   Provider-requested follow-up: Return in about 2 weeks (around 03/25/2017) for Medication Management.  Future Appointments  Date Time Provider [place]  03/25/2017  9:30 AM Gillis Santa, MD [facility] None  05/07/2017 10:00 AM Roselee Nova, MD [facility]    Primary Care Physician: Roselee Nova, MD Location: [facility] Note by: Gillis Santa, M.D, Date: 03/11/2017; Time: 11:32 AM  Patient Instructions  1. UDS today 2. Follow up in 2 weeks

## 2017-03-11 NOTE — Patient Instructions (Signed)
1. UDS today 2. Follow up in 2 weeks

## 2017-03-11 NOTE — Progress Notes (Signed)
Safety precautions to be maintained throughout the outpatient stay will include: orient to surroundings, keep bed in low position, maintain call bell within reach at all times, provide assistance with transfer out of bed and ambulation.  

## 2017-03-17 LAB — COMPLIANCE DRUG ANALYSIS, UR

## 2017-03-25 ENCOUNTER — Encounter: Payer: Self-pay | Admitting: Student in an Organized Health Care Education/Training Program

## 2017-03-25 ENCOUNTER — Other Ambulatory Visit: Payer: Self-pay

## 2017-03-25 ENCOUNTER — Ambulatory Visit
Payer: BLUE CROSS/BLUE SHIELD | Attending: Student in an Organized Health Care Education/Training Program | Admitting: Student in an Organized Health Care Education/Training Program

## 2017-03-25 VITALS — BP 145/72 | HR 80 | Temp 98.4°F | Resp 20 | Ht 72.0 in | Wt 279.0 lb

## 2017-03-25 DIAGNOSIS — Z79899 Other long term (current) drug therapy: Secondary | ICD-10-CM | POA: Diagnosis not present

## 2017-03-25 DIAGNOSIS — Z96653 Presence of artificial knee joint, bilateral: Secondary | ICD-10-CM | POA: Insufficient documentation

## 2017-03-25 DIAGNOSIS — Z79891 Long term (current) use of opiate analgesic: Secondary | ICD-10-CM | POA: Diagnosis not present

## 2017-03-25 DIAGNOSIS — M542 Cervicalgia: Secondary | ICD-10-CM | POA: Diagnosis not present

## 2017-03-25 DIAGNOSIS — M545 Low back pain, unspecified: Secondary | ICD-10-CM

## 2017-03-25 DIAGNOSIS — R739 Hyperglycemia, unspecified: Secondary | ICD-10-CM | POA: Insufficient documentation

## 2017-03-25 DIAGNOSIS — Z87891 Personal history of nicotine dependence: Secondary | ICD-10-CM | POA: Diagnosis not present

## 2017-03-25 DIAGNOSIS — E781 Pure hyperglyceridemia: Secondary | ICD-10-CM | POA: Diagnosis not present

## 2017-03-25 DIAGNOSIS — K5909 Other constipation: Secondary | ICD-10-CM | POA: Insufficient documentation

## 2017-03-25 DIAGNOSIS — Z5181 Encounter for therapeutic drug level monitoring: Secondary | ICD-10-CM | POA: Diagnosis not present

## 2017-03-25 DIAGNOSIS — M5136 Other intervertebral disc degeneration, lumbar region: Secondary | ICD-10-CM

## 2017-03-25 DIAGNOSIS — Z981 Arthrodesis status: Secondary | ICD-10-CM | POA: Diagnosis not present

## 2017-03-25 DIAGNOSIS — G8929 Other chronic pain: Secondary | ICD-10-CM

## 2017-03-25 DIAGNOSIS — M170 Bilateral primary osteoarthritis of knee: Secondary | ICD-10-CM | POA: Insufficient documentation

## 2017-03-25 DIAGNOSIS — G894 Chronic pain syndrome: Secondary | ICD-10-CM

## 2017-03-25 DIAGNOSIS — M51369 Other intervertebral disc degeneration, lumbar region without mention of lumbar back pain or lower extremity pain: Secondary | ICD-10-CM

## 2017-03-25 DIAGNOSIS — I10 Essential (primary) hypertension: Secondary | ICD-10-CM | POA: Diagnosis not present

## 2017-03-25 DIAGNOSIS — E785 Hyperlipidemia, unspecified: Secondary | ICD-10-CM | POA: Diagnosis not present

## 2017-03-25 MED ORDER — OXYCODONE HCL ER 40 MG PO T12A: 40.0000 mg | EXTENDED_RELEASE_TABLET | Freq: Two times a day (BID) | ORAL | 0 refills | Status: DC

## 2017-03-25 MED ORDER — OXYCODONE-ACETAMINOPHEN 10-325 MG PO TABS: 1.0000 | ORAL_TABLET | Freq: Two times a day (BID) | ORAL | 0 refills | Status: DC | PRN

## 2017-03-25 NOTE — Progress Notes (Signed)
Patient's Name: Lawrence R Villamar Jr.  MRN: [id number]  Referring Provider: Shah, Syed Asad A, MD  DOB: 07/20/1955  PCP: Shah, Syed Asad A, MD  DOS: 03/25/2017  Note by: Bilal Lateef, MD  Service setting: Ambulatory outpatient  Specialty: Interventional Pain Management  Location: [facility] (AMB) Pain Management Facility    Patient type: Established   Primary Reason(s) for Visit: Encounter for prescription drug management. (Level of risk: moderate)  CC: Back Pain (lower right) and Neck Pain  HPI  Lawrence Thornton is a 60 y.o. year old, male patient, who comes today for a medication management evaluation. He has Primary osteoarthritis of left knee; Status post total left knee replacement using cement; S/P knee surgery; Chronic low back pain; BPH associated with nocturia; Dyslipidemia; Continuous opioid dependence (HCC); Chronic LBP; Chronic constipation; Benign fibroma of prostate; DDD (degenerative disc disease), lumbar; Failure of erection; BP (high blood pressure); Long term current use of opiate analgesic; S/P knee replacement; Osteoarthritis of right knee; Gonalgia; History of colon polyps; Hypertriglyceridemia; Need for prophylactic vaccination and inoculation against influenza; Congested; Extreme obesity; Preop examination; Screening for gout; Need for vaccination with Comvax; Constipation due to opioid therapy; Hyperglycemia; and Chest pain on their problem list. His primarily concern today is the Back Pain (lower right) and Neck Pain  Pain Assessment: Location: Right Back(lower right) Radiating: denies Onset: More than a month ago Duration: Chronic pain Quality: Sharp, Dull Severity: 3 /10 (self-reported pain score)  Note: Reported level is compatible with observation.                         When using our objective Pain Scale, levels between 6 and 10/10 are said to belong in an emergency room, as it progressively worsens from a 6/10, described as severely limiting, requiring emergency care not  usually available at an outpatient pain management facility. At a 6/10 level, communication becomes difficult and requires great effort. Assistance to reach the emergency department may be required. Facial flushing and profuse sweating along with potentially dangerous increases in heart rate and blood pressure will be evident. Effect on ADL: slowed way down Timing: Constant Modifying factors: moving around medication  Lawrence Thornton was last scheduled for an appointment on 03/11/2017 for medication management. During today's appointment we reviewed Lawrence Thornton's chronic pain status, as well as his outpatient medication regimen.  The patient  reports that he does not use drugs. His body mass index is 37.84 kg/m.  Further details on both, my assessment(s), as well as the proposed treatment plan, please see below.  Controlled Substance Pharmacotherapy Assessment REMS (Risk Evaluation and Mitigation Strategy)  PMP and historical list of controlled substances: Percocet 10 mg 3 times daily as needed for breakthrough, OxyContin 40 mg twice daily. MME/day: Percocet equals 45, OxyContin equals 120 mg/day total equals 165 No notes on file Pharmacokinetics: Liberation and absorption (onset of action): WNL Distribution (time to peak effect): WNL Metabolism and excretion (duration of action): WNL         Pharmacodynamics: Desired effects: Analgesia: Lawrence Thornton reports >50% benefit. Functional ability: Patient reports that medication allows him to accomplish basic ADLs Clinically meaningful improvement in function (CMIF): Sustained CMIF goals met Perceived effectiveness: Described as relatively effective, allowing for increase in activities of daily living (ADL) Undesirable effects: Side-effects or Adverse reactions: None reported Monitoring: [place] PMP: Online review of the past 12-month period conducted. Compliant with practice rules and regulations Last UDS on record: Summary  Date Value Ref   Range  Status  03/11/2017 FINAL  Final    Comment:    ==================================================================== TOXASSURE COMP DRUG ANALYSIS,UR ==================================================================== Test                             Result       Flag       Units Drug Present and Declared for Prescription Verification   Oxycodone                      2974         EXPECTED   ng/mg creat   Oxymorphone                    1740         EXPECTED   ng/mg creat   Noroxycodone                   2878         EXPECTED   ng/mg creat   Noroxymorphone                 420          EXPECTED   ng/mg creat    Sources of oxycodone are scheduled prescription medications.    Oxymorphone, noroxycodone, and noroxymorphone are expected    metabolites of oxycodone. Oxymorphone is also available as a    scheduled prescription medication.   Acetaminophen                  PRESENT      EXPECTED Drug Absent but Declared for Prescription Verification   Tizanidine                     Not Detected UNEXPECTED    Tizanidine, as indicated in the declared medication list, is not    always detected even when used as directed. ==================================================================== Test                      Result    Flag   Units      Ref Range   Creatinine              112              mg/dL      >=20 ==================================================================== Declared Medications:  The flagging and interpretation on this report are based on the  following declared medications.  Unexpected results may arise from  inaccuracies in the declared medications.  **Note: The testing scope of this panel includes these medications:  Oxycodone  Oxycodone (Oxycodone Acetaminophen)  **Note: The testing scope of this panel does not include small to  moderate amounts of these reported medications:  Acetaminophen (Oxycodone Acetaminophen)  Tizanidine  **Note: The testing scope of this panel does  not include following  reported medications:  Doxazosin  Gemfibrozil  Linaclotide  Lisinopril ==================================================================== For clinical consultation, please call [phone]. ====================================================================    UDS interpretation: Compliant          Medication Assessment Form: Reviewed. Patient indicates being compliant with therapy Treatment compliance: Compliant Risk Assessment Profile: Aberrant behavior: See prior evaluations. None observed or detected today Comorbid factors increasing risk of overdose: See prior notes. No additional risks detected today Risk of substance use disorder (SUD): Low Opioid Risk Tool - 03/25/17 0940      Family History of Substance Abuse   Alcohol  Negative      Illegal Drugs  Negative    Rx Drugs  Negative      Personal History of Substance Abuse   Alcohol  Negative    Illegal Drugs  Negative    Rx Drugs  Negative      Age   Age between 16-45 years   No      History of Preadolescent Sexual Abuse   History of Preadolescent Sexual Abuse  Negative or Male      Psychological Disease   Psychological Disease  Negative    Depression  Negative      Total Score   Opioid Risk Tool Scoring  0    Opioid Risk Interpretation  Low Risk      ORT Scoring interpretation table:  Score <3 = Low Risk for SUD  Score between 4-7 = Moderate Risk for SUD  Score >8 = High Risk for Opioid Abuse   Risk Mitigation Strategies:  Patient Counseling: Completed today. Counseling provided to patient as per "Patient Counseling Document". Document signed by patient, attesting to counseling and understanding Patient-Prescriber Agreement (PPA): Obtained today  Notification to other healthcare providers: Written and sent today  Pharmacologic Plan: Today we will take over the chronic pain medication management and from this point on our medication agreement with this patient is active continue  OxyContin at its current dose, wean Percocet to twice daily as needed.  Laboratory Chemistry  Inflammation Markers (CRP: Acute Phase) (ESR: Chronic Phase) No results found for: CRP, ESRSEDRATE, LATICACIDVEN               Rheumatology Markers No results found for: Elayne Guerin, [facility]              Renal Function Markers Lab Results  Component Value Date   BUN 15 11/11/2015   CREATININE 0.78 11/11/2015   GFRAA 114 11/11/2015   GFRNONAA 99 11/11/2015                 Hepatic Function Markers Lab Results  Component Value Date   AST 18 11/11/2015   ALT 27 11/11/2015   ALBUMIN 4.6 11/11/2015   ALKPHOS 76 11/11/2015   HCVAB NEGATIVE 10/06/2016                 Electrolytes Lab Results  Component Value Date   NA 141 11/11/2015   K 5.1 11/11/2015   CL 100 11/11/2015   CALCIUM 10.0 11/11/2015                 Neuropathy Markers Lab Results  Component Value Date   HGBA1C 6.0 01/07/2017                 Bone Pathology Markers Lab Results  Component Value Date   VD25OH 43 10/06/2016                 Coagulation Parameters Lab Results  Component Value Date   INR 1.07 12/13/2014   LABPROT 14.1 12/13/2014   APTT 34 12/13/2014   PLT 252 10/06/2016                 Cardiovascular Markers Lab Results  Component Value Date   HGB 13.6 10/06/2016   HCT 39.3 10/06/2016                 CA Markers No results found for: CEA, CA125, LABCA2               Note: Lab results reviewed.   Meds  Current Outpatient Medications:  .  doxazosin (CARDURA) 8 MG tablet, take 1 tablet by mouth at bedtime, Disp: 90 tablet, Rfl: 3 .  gemfibrozil (LOPID) 600 MG tablet, take 1 tablet by mouth twice a day before meals, Disp: 180 tablet, Rfl: 0 .  linaclotide (LINZESS) 145 MCG CAPS capsule, Take 1 capsule (145 mcg total) by mouth daily., Disp: 30 capsule, Rfl: 0 .  lisinopril (PRINIVIL,ZESTRIL) 20 MG tablet, Take 1.5 tablets (30 mg total) by mouth at bedtime.,  Disp: 135 tablet, Rfl: 0 .  oxyCODONE (OXYCONTIN) 40 mg 12 hr tablet, Take 1 tablet (40 mg total) by mouth every 12 (twelve) hours. For chronic pain. To last for 30 days from fill date.  DNF: 04/03/17, 05/03/17, Disp: 60 tablet, Rfl: 0 .  oxyCODONE-acetaminophen (PERCOCET) 10-325 MG tablet, Take 1 tablet by mouth 2 (two) times daily as needed for pain. For chronic pain. To last for 30 days from fill date.  DNF: 04/03/17, 05/03/17, Disp: 60 tablet, Rfl: 0 .  tiZANidine (ZANAFLEX) 4 MG tablet, Take 1 tablet (4 mg total) by mouth at bedtime., Disp: 30 tablet, Rfl: 2  ROS  Constitutional: Denies any fever or chills Gastrointestinal: No reported hemesis, hematochezia, vomiting, or acute GI distress Musculoskeletal: Denies any acute onset joint swelling, redness, loss of ROM, or weakness Neurological: No reported episodes of acute onset apraxia, aphasia, dysarthria, agnosia, amnesia, paralysis, loss of coordination, or loss of consciousness  Allergies  Lawrence Thornton has No Known Allergies.  PFSH  Drug: Lawrence Thornton  reports that he does not use drugs. Alcohol:  reports that he does not drink alcohol. Tobacco:  reports that he quit smoking about 17 years ago. he has never used smokeless tobacco. Medical:  has a past medical history of Arthritis and Hypertension. Surgical: Lawrence Thornton  has a past surgical history that includes Back surgery (lower back surgery x 3 ); Appendectomy (age 10); sebaceous cyst removed (Right, 15 years ago); Toe Fusion (Right); metal removed from eye (years ago); Total knee arthroplasty (Left, 08/10/2014); Joint replacement; Total knee arthroplasty (Right, 12/21/2014); Total knee arthroplasty (Right, 12/21/14); and Lumbar fusion (1999). Family: family history includes Cancer in his mother; Hyperlipidemia in his father; Hypertension in his father.  Constitutional Exam  General appearance: Well nourished, well developed, and well hydrated. In no apparent acute distress Vitals:    03/25/17 0933  BP: (!) 145/72  Pulse: 80  Resp: 20  Temp: 98.4 F (36.9 C)  TempSrc: Oral  SpO2: 98%  Weight: 279 lb (126.6 kg)  Height: 6' (1.829 m)   BMI Assessment: Estimated body mass index is 37.84 kg/m as calculated from the following:   Height as of this encounter: 6' (1.829 m).   Weight as of this encounter: 279 lb (126.6 kg).  BMI interpretation table: BMI level Category Range association with higher incidence of chronic pain  <18 kg/m2 Underweight   18.5-24.9 kg/m2 Ideal body weight   25-29.9 kg/m2 Overweight Increased incidence by 20%  30-34.9 kg/m2 Obese (Class I) Increased incidence by 68%  35-39.9 kg/m2 Severe obesity (Class II) Increased incidence by 136%  >40 kg/m2 Extreme obesity (Class III) Increased incidence by 254%   BMI Readings from Last 4 Encounters:  03/25/17 37.84 kg/m  03/11/17 37.57 kg/m  03/04/17 38.42 kg/m  02/05/17 37.53 kg/m   Wt Readings from Last 4 Encounters:  03/25/17 279 lb (126.6 kg)  03/11/17 277 lb (125.6 kg)  03/04/17 283 lb 4.8 oz (128.5 kg)  02/05/17 276 lb 11.2 oz (125.5   kg)  Psych/Mental status: Alert, oriented x 3 (person, place, & time)       Eyes: PERLA Respiratory: No evidence of acute respiratory distress  Cervical Spine Area Exam  Skin & Axial Inspection: No masses, redness, edema, swelling, or associated skin lesions Alignment: Symmetrical Functional ROM: Unrestricted ROM      Stability: No instability detected Muscle Tone/Strength: Functionally intact. No obvious neuro-muscular anomalies detected. Sensory (Neurological): Unimpaired Palpation: No palpable anomalies              Upper Extremity (UE) Exam    Side: Right upper extremity  Side: Left upper extremity  Skin & Extremity Inspection: Skin color, temperature, and hair growth are WNL. No peripheral edema or cyanosis. No masses, redness, swelling, asymmetry, or associated skin lesions. No contractures.  Skin & Extremity Inspection: Skin color, temperature,  and hair growth are WNL. No peripheral edema or cyanosis. No masses, redness, swelling, asymmetry, or associated skin lesions. No contractures.  Functional ROM: Unrestricted ROM          Functional ROM: Unrestricted ROM          Muscle Tone/Strength: Functionally intact. No obvious neuro-muscular anomalies detected.  Muscle Tone/Strength: Functionally intact. No obvious neuro-muscular anomalies detected.  Sensory (Neurological): Unimpaired          Sensory (Neurological): Unimpaired          Palpation: No palpable anomalies              Palpation: No palpable anomalies              Specialized Test(s): Deferred         Specialized Test(s): Deferred          Thoracic Spine Area Exam  Skin & Axial Inspection: No masses, redness, or swelling Alignment: Symmetrical Functional ROM: Unrestricted ROM Stability: No instability detected Muscle Tone/Strength: Functionally intact. No obvious neuro-muscular anomalies detected. Sensory (Neurological): Unimpaired Muscle strength & Tone: No palpable anomalies  Lumbar Spine Area Exam  Skin & Axial Inspection: Well healed scar from previous spine surgery detected Alignment: Symmetrical Functional ROM: Decreased ROM     With lumbar extension Stability: No instability detected Muscle Tone/Strength: Functionally intact. No obvious neuro-muscular anomalies detected. Sensory (Neurological): Unimpaired Palpation: Complains of area being tender to palpation       Provocative Tests: Lumbar Hyperextension and rotation test: Positive bilaterally for facet joint pain. Lumbar Lateral bending test: evaluation deferred today       Patrick's Maneuver: Positive bilaterally for SI joint arthralgia.                    Gait & Posture Assessment  Ambulation: Unassisted Gait: Relatively normal for age and body habitus Posture: WNL   Lower Extremity Exam    Side: Right lower extremity  Side: Left lower extremity  Skin & Extremity Inspection: Skin color,  temperature, and hair growth are WNL. No peripheral edema or cyanosis. No masses, redness, swelling, asymmetry, or associated skin lesions. No contractures.  Skin & Extremity Inspection: Skin color, temperature, and hair growth are WNL. No peripheral edema or cyanosis. No masses, redness, swelling, asymmetry, or associated skin lesions. No contractures.  Functional ROM: Unrestricted ROM          Functional ROM: Unrestricted ROM          Muscle Tone/Strength: Functionally intact. No obvious neuro-muscular anomalies detected.  Muscle Tone/Strength: Functionally intact. No obvious neuro-muscular anomalies detected.  Sensory (Neurological): Unimpaired  Sensory (Neurological): Unimpaired  Palpation: No  palpable anomalies  Palpation: No palpable anomalies     Assessment  Primary Diagnosis & Pertinent Problem List: The primary encounter diagnosis was Chronic pain syndrome. Diagnoses of Chronic right-sided low back pain without sciatica, Chronic use of opiate for therapeutic purpose, S/P lumbar fusion, and Lumbar degenerative disc disease were also pertinent to this visit.  Status Diagnosis  Controlled Controlled Controlled 1. Chronic pain syndrome   2. Chronic right-sided low back pain without sciatica   3. Chronic use of opiate for therapeutic purpose   4. S/P lumbar fusion   5. Lumbar degenerative disc disease     General Recommendations: The pain condition that the patient suffers from is best treated with a multidisciplinary approach that involves an increase in physical activity to prevent de-conditioning and worsening of the pain cycle, as well as psychological counseling (formal and/or informal) to address the co-morbid psychological affects of pain. Treatment will often involve judicious use of pain medications and interventional procedures to decrease the pain, allowing the patient to participate in the physical activity that will ultimately produce long-lasting pain reductions. The goal  of the multidisciplinary approach is to return the patient to a higher level of overall function and to restore their ability to perform activities of daily living.   60-year-old male who has been on chronic opioid therapy for greater than 10 years referred from his primary care physician for chronic pain related to his axial lumbar spine and bilateral knees.  Patient's primary pain is in his lower back.  Patient has an impressive lumbar surgical history which includes an L3-S1 posterior lumbar fusion with a sequelae of rod fracture that is been evaluated by orthopedic surgeon and surgery has not been recommended given how extensive reconstruction would be.  Patient has tried cortisone injections, physical therapy, non-opioid analgesics including meloxicam which resulted in shortness of breath, gabapentin which resulted in vivid dreams and impaired cognition, Lyrica which resulted in leg swelling, and various muscle relaxants only of which tizanidine is somewhat effective.  Patient has been stable on his chronic opioid regimen for approximately 8 years which includes OxyContin 40 mg twice daily, Percocet 10 mg 3 times daily as needed for breakthrough pain.  I had an extensive discussion about opiate-induced hyperalgesia and his total morphine milliequivalents.  While patient has been stable on this dose for many years, I would like to wean his total opioid dose down.  Patient states that he finds greater benefit with the long-acting OxyContin so I discussed weaning down his Percocet for breakthrough pain.  Patient's urine drug screen was appropriate as above.  We will wean his Percocet to 10 mg twice daily as needed, quantity 60 a month (from 3 times daily as needed, quantity 90-month).  We will continue same dose of OxyContin since patient finds greater benefit with his long-acting opioid medication.  Plan: -Prescription for OxyContin and Percocet as below.  Wean Percocet from #90 to #60 a  month. -Instructed patient to increase tizanidine to twice daily to 3 times daily as needed if he is having more pain with weaning his total Percocet. -Follow-up in 2 months  Time Note: Greater than 50% of the 25 minute(s) of face-to-face time spent with Lawrence Thornton, was spent in counseling/coordination of care regarding: opioid tolerance, the treatment plan, medication side effects, the opioid analgesic risks and possible complications, the medication agreement and the patient's responsibilities when it comes to controlled substances.   Plan of Care  Pharmacotherapy (Medications Ordered): Meds ordered this encounter  Medications  .   DISCONTD: oxyCODONE (OXYCONTIN) 40 mg 12 hr tablet    Sig: Take 1 tablet (40 mg total) by mouth every 12 (twelve) hours. For chronic pain. To last for 30 days from fill date.  DNF: 04/03/17, 05/03/17    Dispense:  60 tablet    Refill:  0  . DISCONTD: oxyCODONE-acetaminophen (PERCOCET) 10-325 MG tablet    Sig: Take 1 tablet by mouth 2 (two) times daily as needed for pain. For chronic pain. To last for 30 days from fill date.  DNF: 04/03/17, 05/03/17    Dispense:  60 tablet    Refill:  0  . oxyCODONE (OXYCONTIN) 40 mg 12 hr tablet    Sig: Take 1 tablet (40 mg total) by mouth every 12 (twelve) hours. For chronic pain. To last for 30 days from fill date.  DNF: 04/03/17, 05/03/17    Dispense:  60 tablet    Refill:  0  . oxyCODONE-acetaminophen (PERCOCET) 10-325 MG tablet    Sig: Take 1 tablet by mouth 2 (two) times daily as needed for pain. For chronic pain. To last for 30 days from fill date.  DNF: 04/03/17, 05/03/17    Dispense:  60 tablet    Refill:  0     Provider-requested follow-up: Return in about 8 weeks (around 05/20/2017) for Medication Management.  Future Appointments  Date Time Provider Department Center  05/07/2017 10:00 AM Shah, Syed Asad A, MD [facility]    Primary Care Physician: Shah, Syed Asad A, MD Location: [facility] Facility Note by: Bilal Lateef, M.D Date: 03/25/2017; Time: 10:05 AM  Patient Instructions  1. Sign opioid agreement 2. Continue Oxycontin at current dose, decrease Percocet to 10 mg twice daily as needed 3. Ok to increase Tizanidine as needed when having more pain  

## 2017-03-25 NOTE — Patient Instructions (Addendum)
1. Sign opioid agreement 2. Continue Oxycontin at current dose, decrease Percocet to 10 mg twice daily as needed 3. Ok to increase Tizanidine as needed when having more pain  Prescriptions given to patient with instructions

## 2017-03-26 ENCOUNTER — Telehealth: Payer: Self-pay

## 2017-03-26 NOTE — Telephone Encounter (Signed)
Gastroenterology Pre-Procedure Review  Request Date:   Requesting Physician: Dr. Tobi BastosAnna  PATIENT REVIEW QUESTIONS: The patient responded to the following health history questions as indicated:    1. Are you having any GI issues? No  2. Do you have a personal history of Polyps? Yes  3. Do you have a family history of Colon Cancer or Polyps? No  4. Diabetes Mellitus? No  5. Joint replacements in the past 12 months? No  6. Major health problems in the past 3 months? No  7. Any artificial heart valves, MVP, or defibrillator? No     MEDICATIONS & ALLERGIES:    Patient reports the following regarding taking any anticoagulation/antiplatelet therapy:   Plavix, Coumadin, Eliquis, Xarelto, Lovenox, Pradaxa, Brilinta, or Effient? No  Aspirin? No   Patient confirms/reports the following medications:  Current Outpatient Medications  Medication Sig Dispense Refill  . doxazosin (CARDURA) 8 MG tablet take 1 tablet by mouth at bedtime 90 tablet 3  . gemfibrozil (LOPID) 600 MG tablet take 1 tablet by mouth twice a day before meals 180 tablet 0  . linaclotide (LINZESS) 145 MCG CAPS capsule Take 1 capsule (145 mcg total) by mouth daily. 30 capsule 0  . lisinopril (PRINIVIL,ZESTRIL) 20 MG tablet Take 1.5 tablets (30 mg total) by mouth at bedtime. 135 tablet 0  . oxyCODONE (OXYCONTIN) 40 mg 12 hr tablet Take 1 tablet (40 mg total) by mouth every 12 (twelve) hours. For chronic pain. To last for 30 days from fill date.  DNF: 04/03/17, 05/03/17 60 tablet 0  . oxyCODONE-acetaminophen (PERCOCET) 10-325 MG tablet Take 1 tablet by mouth 2 (two) times daily as needed for pain. For chronic pain. To last for 30 days from fill date.  DNF: 04/03/17, 05/03/17 60 tablet 0  . tiZANidine (ZANAFLEX) 4 MG tablet Take 1 tablet (4 mg total) by mouth at bedtime. 30 tablet 2   No current facility-administered medications for this visit.     Patient confirms/reports the following allergies:  No Known Allergies  No orders of  the defined types were placed in this encounter.   AUTHORIZATION INFORMATION Primary Insurance: 1D#: Group #:  Secondary Insurance: 1D#: Group #:  SCHEDULE INFORMATION: Date: 05/04/17 Time: Location: [facility]

## 2017-03-31 ENCOUNTER — Other Ambulatory Visit: Payer: Self-pay

## 2017-03-31 DIAGNOSIS — Z86010 Personal history of colonic polyps: Secondary | ICD-10-CM

## 2017-04-14 ENCOUNTER — Other Ambulatory Visit: Payer: Self-pay

## 2017-04-14 DIAGNOSIS — Z1211 Encounter for screening for malignant neoplasm of colon: Secondary | ICD-10-CM

## 2017-04-14 MED ORDER — PEG 3350-KCL-NA BICARB-NACL 420 G PO SOLR: 4000.0000 mL | Freq: Once | ORAL | 0 refills | Status: AC

## 2017-05-04 ENCOUNTER — Encounter: Payer: Self-pay | Admitting: Anesthesiology

## 2017-05-04 ENCOUNTER — Encounter
Admission: RE | Disposition: A | Discharge status: Home / Self Care | Payer: Self-pay | Source: Ambulatory Visit | Attending: Gastroenterology

## 2017-05-04 ENCOUNTER — Ambulatory Visit
Admission: RE | Admit: 2017-05-04 | Discharge: 2017-05-04 | Disposition: A | Discharge status: Home / Self Care | Payer: BLUE CROSS/BLUE SHIELD | Source: Ambulatory Visit | Attending: Gastroenterology | Admitting: Gastroenterology

## 2017-05-04 ENCOUNTER — Ambulatory Visit: Payer: BLUE CROSS/BLUE SHIELD | Admitting: Anesthesiology

## 2017-05-04 DIAGNOSIS — Z860100 Personal history of colon polyps, unspecified: Secondary | ICD-10-CM

## 2017-05-04 DIAGNOSIS — Z96653 Presence of artificial knee joint, bilateral: Secondary | ICD-10-CM | POA: Diagnosis not present

## 2017-05-04 DIAGNOSIS — I10 Essential (primary) hypertension: Secondary | ICD-10-CM | POA: Insufficient documentation

## 2017-05-04 DIAGNOSIS — Z79899 Other long term (current) drug therapy: Secondary | ICD-10-CM | POA: Diagnosis not present

## 2017-05-04 DIAGNOSIS — K640 First degree hemorrhoids: Secondary | ICD-10-CM | POA: Diagnosis not present

## 2017-05-04 DIAGNOSIS — Z981 Arthrodesis status: Secondary | ICD-10-CM | POA: Insufficient documentation

## 2017-05-04 DIAGNOSIS — Z1211 Encounter for screening for malignant neoplasm of colon: Secondary | ICD-10-CM | POA: Insufficient documentation

## 2017-05-04 DIAGNOSIS — Z87891 Personal history of nicotine dependence: Secondary | ICD-10-CM | POA: Diagnosis not present

## 2017-05-04 DIAGNOSIS — M1990 Unspecified osteoarthritis, unspecified site: Secondary | ICD-10-CM | POA: Diagnosis not present

## 2017-05-04 HISTORY — PX: COLONOSCOPY WITH PROPOFOL: SHX5780

## 2017-05-04 SURGERY — COLONOSCOPY WITH PROPOFOL
Anesthesia: General

## 2017-05-04 MED ORDER — SODIUM CHLORIDE 0.9 % IV SOLN
INTRAVENOUS | Status: DC
  Administered 2017-05-04: 1000 mL via INTRAVENOUS

## 2017-05-04 MED ORDER — PROPOFOL 10 MG/ML IV BOLUS
INTRAVENOUS | Status: DC | PRN
  Administered 2017-05-04: 20 mg via INTRAVENOUS
  Administered 2017-05-04: 50 mg via INTRAVENOUS
  Administered 2017-05-04: 30 mg via INTRAVENOUS

## 2017-05-04 MED ORDER — FENTANYL CITRATE (PF) 100 MCG/2ML IJ SOLN: 25.0000 ug | INTRAMUSCULAR | Status: DC | PRN

## 2017-05-04 MED ORDER — ONDANSETRON HCL 4 MG/2ML IJ SOLN: 4.0000 mg | Freq: Once | INTRAMUSCULAR | Status: DC | PRN

## 2017-05-04 MED ORDER — SUCCINYLCHOLINE CHLORIDE 20 MG/ML IJ SOLN
INTRAMUSCULAR | Status: AC
Start: 2017-05-04 — End: 2017-05-04
  Filled 2017-05-04: qty 1

## 2017-05-04 MED ORDER — LIDOCAINE HCL (PF) 2 % IJ SOLN
INTRAMUSCULAR | Status: AC
  Filled 2017-05-04: qty 10

## 2017-05-04 MED ORDER — LIDOCAINE HCL (CARDIAC) 20 MG/ML IV SOLN
INTRAVENOUS | Status: DC | PRN
  Administered 2017-05-04: 50 mg via INTRAVENOUS

## 2017-05-04 MED ORDER — PROPOFOL 500 MG/50ML IV EMUL
INTRAVENOUS | Status: AC
  Filled 2017-05-04: qty 50

## 2017-05-04 MED ORDER — PROPOFOL 500 MG/50ML IV EMUL
INTRAVENOUS | Status: DC | PRN
  Administered 2017-05-04: 160 ug/kg/min via INTRAVENOUS

## 2017-05-04 NOTE — Anesthesia Post-op Follow-up Note (Signed)
Anesthesia QCDR form completed.        

## 2017-05-04 NOTE — Transfer of Care (Signed)
Immediate Anesthesia Transfer of Care Note  Patient: Lawrence PaulsMarshall R Korol Jr.  Procedure(s) Performed: COLONOSCOPY WITH PROPOFOL (N/A )  Patient Location: PACU  Anesthesia Type:General  Level of Consciousness: awake and alert   Airway & Oxygen Therapy: Patient Spontanous Breathing and Patient connected to nasal cannula oxygen  Post-op Assessment: Report given to RN and Post -op Vital signs reviewed and stable  Post vital signs: Reviewed and stable  Last Vitals:  Vitals:   05/04/17 0707 05/04/17 0809  BP: (!) 177/99 109/60  Pulse: 78 79  Resp: 20 14  Temp: (!) 36.1 C (!) 36.4 C  SpO2: 96% 99%    Last Pain:  Vitals:   05/04/17 0809  TempSrc: Temporal  PainSc:          Complications: No apparent anesthesia complications

## 2017-05-04 NOTE — Anesthesia Postprocedure Evaluation (Signed)
Anesthesia Post Note  Patient: Lawrence PaulsMarshall R Arkwright Jr.  Procedure(s) Performed: COLONOSCOPY WITH PROPOFOL (N/A )  Patient location during evaluation: PACU Anesthesia Type: General Level of consciousness: awake and alert and oriented Pain management: pain level controlled Vital Signs Assessment: post-procedure vital signs reviewed and stable Respiratory status: spontaneous breathing Cardiovascular status: blood pressure returned to baseline Anesthetic complications: no     Last Vitals:  Vitals:   05/04/17 0707 05/04/17 0809  BP: (!) 177/99 109/60  Pulse: 78 79  Resp: 20 14  Temp: (!) 36.1 C (!) 36.4 C  SpO2: 96% 99%    Last Pain:  Vitals:   05/04/17 0839  TempSrc:   PainSc: 0-No pain                 Ediberto Sens

## 2017-05-04 NOTE — Op Note (Signed)
[facility] Gastroenterology Patient Name: Lawrence Thornton Procedure Date: 05/04/2017 7:39 AM MRN: [id number] Account #: [id number] Date of Birth: 01-20-1956 Admit Type: Outpatient Age: 62 [facility]OM 4 Gender: Male Note Status: Finalized Procedure:            Colonoscopy Indications:          High risk colon cancer surveillance: Personal history                        of colonic polyps Providers:            Midge Miniumarren Joelie Schou MD, MD Referring MD:         Shelbie AmmonsSyed A. Sherryll BurgerShah (Referring MD) Medicines:            Propofol per Anesthesia Complications:        No immediate complications. Procedure:            Pre-Anesthesia Assessment:                       - Prior to the procedure, a History and Physical was                        performed, and patient medications and allergies were                        reviewed. The patient's tolerance of previous                        anesthesia was also reviewed. The risks and benefits of                        the procedure and the sedation options and risks were                        discussed with the patient. All questions were                        answered, and informed consent was obtained. Prior                        Anticoagulants: The patient has taken no previous                        anticoagulant or antiplatelet agents. ASA Grade                        Assessment: II - A patient with mild systemic disease.                        After reviewing the risks and benefits, the patient was                        deemed in satisfactory condition to undergo the                        procedure.                       After obtaining informed consent, the colonoscope was  passed under direct vision. Throughout the procedure,                        the patient's blood pressure, pulse, and oxygen                        saturations were monitored continuously. The                        Colonoscope was  introduced through the anus and                        advanced to the the cecum, identified by appendiceal                        orifice and ileocecal valve. The colonoscopy was                        performed without difficulty. The patient tolerated the                        procedure well. The quality of the bowel preparation                        was excellent. Findings:      The perianal and digital rectal examinations were normal.      Non-bleeding internal hemorrhoids were found during retroflexion. The       hemorrhoids were Grade I (internal hemorrhoids that do not prolapse). Impression:           - Non-bleeding internal hemorrhoids.                       - No specimens collected. Recommendation:       - Discharge patient to home.                       - Resume previous diet.                       - Continue present medications.                       - Repeat colonoscopy in 5 years for surveillance. Procedure Code(s):    --- Professional ---                       [phone], Colonoscopy, flexible; diagnostic, including                        collection of specimen(s) by brushing or washing, when                        performed (separate procedure) Diagnosis Code(s):    --- Professional ---                       Z86.010, Personal history of colonic polyps CPT copyright 2016 American Medical Association. All rights reserved. The codes documented in this report are preliminary and upon coder review may  be revised to meet current compliance requirements. Midge Minium MD, MD 05/04/2017 8:06:53 AM This report has been signed electronically. Number of Addenda: 0 Note Initiated On: 05/04/2017  7:39 AM Scope Withdrawal Time: 0 hours 6 minutes 25 seconds  Total Procedure Duration: 0 hours 8 minutes 57 seconds       [facility]

## 2017-05-04 NOTE — Anesthesia Procedure Notes (Signed)
Date/Time: 05/04/2017 7:51 AM Performed by: Ginger CarneMichelet, [place] Brasington, CRNA Pre-anesthesia Checklist: Patient identified, Emergency Drugs available, Suction available, Patient being monitored and Timeout performed Patient Re-evaluated:Patient Re-evaluated prior to induction Oxygen Delivery Method: Nasal cannula

## 2017-05-04 NOTE — H&P (Signed)
Midge Minium, MD [facility] [address], [address] [place], [place] [address] Phone:[phone] Fax : [phone]  Primary Care Physician:  Ellyn Hack, MD Primary Gastroenterologist:  Dr. Servando Snare  Pre-Procedure History & Physical: HPI:  Lawrence Thornton. is a 62 y.o. male is here for an colonoscopy.   Past Medical History:  Diagnosis Date  . Arthritis    oa  . Hypertension     Past Surgical History:  Procedure Laterality Date  . APPENDECTOMY  age 89  . BACK SURGERY  lower back surgery x 3    L 3 L 4 and L 5 are fused together  . JOINT REPLACEMENT    . LUMBAR FUSION  1999   L- to L5  . metal removed from eye  years ago  . sebaceous cyst removed Right 15 years ago   middle  finger   . TOE FUSION Right    4th toe  . 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 TOTAL KNEE ARTHROPLASTY Right 12/21/2014   Procedure: RIGHT TOTAL KNEE ARTHROPLASTY;  Surgeon: Eugenia Mcalpine, MD;  Location: [facility] ORS;  Service: Orthopedics;  Laterality: Right;  . TOTAL KNEE ARTHROPLASTY Right 12/21/14    Prior to Admission medications   Medication Sig Start Date End Date Taking? Authorizing Provider  doxazosin (CARDURA) 8 MG tablet take 1 tablet by mouth at bedtime 10/20/16  Yes Brayton El Asad A, MD  gemfibrozil (LOPID) 600 MG tablet take 1 tablet by mouth twice a day before meals 01/07/17  Yes Ellyn Hack, MD  linaclotide [facility]) 145 MCG CAPS capsule Take 1 capsule (145 mcg total) by mouth daily. 08/07/16  Yes Velta Addison A, MD  lisinopril (PRINIVIL,ZESTRIL) 20 MG tablet Take 1.5 tablets (30 mg total) by mouth at bedtime. 01/07/17  Yes Ellyn Hack, MD  oxyCODONE (OXYCONTIN) 40 mg 12 hr tablet Take 1 tablet (40 mg total) by mouth every 12 (twelve) hours. For chronic pain. To last for 30 days from fill date.  DNF: 04/03/17, 05/03/17 03/25/17  Yes Edward Jolly, MD  oxyCODONE-acetaminophen (PERCOCET) 10-325 MG tablet Take 1 tablet by mouth 2 (two) times daily as needed for pain. For chronic pain. To last for 30 days from fill date.  DNF: 04/03/17, 05/03/17 03/25/17  Yes Edward Jolly, MD  tiZANidine (ZANAFLEX) 4 MG tablet Take 1 tablet (4 mg total) by mouth at bedtime. 03/04/17  Yes Ellyn Hack, MD    Allergies as of 04/01/2017  . (No Known Allergies)    Family History  Problem Relation Age of Onset  . Cancer Mother        Throat  . Hypertension Father   . Hyperlipidemia Father     Social History   Socioeconomic History  . Marital status: Married    Spouse name: Not on file  . Number of children: Not on file  . Years of education: Not on file  . Highest education level: Not on file  Social Needs  . Financial resource strain: Not on file  . Food insecurity - worry: Not on file  . Food insecurity - inability: Not on file  . Transportation needs - medical: Not on file  . Transportation needs - non-medical: Not on file  Occupational History  . Not on file  Tobacco Use  . Smoking status: Former Smoker  Last attempt to quit: 04/07/1999    Years since quitting: 18.0  . Smokeless tobacco: Never Used  Substance and Sexual Activity  . Alcohol use: No  . Drug use: No  . Sexual activity: Yes    Birth control/protection: Condom  Other Topics Concern  . Not on file  Social History Narrative  . Not on file    Review of Systems: See HPI, otherwise negative ROS  Physical Exam: BP (!) 177/99   Pulse 78   Temp (!) 96.9 F (36.1 C) (Tympanic)   Resp 20   Ht 6' (1.829 m)   Wt 274 lb (124.3 kg)   SpO2 96%   BMI 37.16 kg/m  General:   Alert,  pleasant and cooperative in NAD Head:  Normocephalic and atraumatic. Neck:  Supple; no masses or thyromegaly. Lungs:  Clear throughout to auscultation.    Heart:  Regular rate and rhythm. Abdomen:  Soft, nontender and nondistended. Normal bowel sounds, without guarding, and  without rebound.   Neurologic:  Alert and  oriented x4;  grossly normal neurologically.  Impression/Plan: Lawrence PaulsMarshall R Barrett Jr. is here for an colonoscopy to be performed for history of colon polyps  Risks, benefits, limitations, and alternatives regarding  colonoscopy have been reviewed with the patient.  Questions have been answered.  All parties agreeable.   Midge Miniumarren Yaiza Palazzola, MD  05/04/2017, 7:44 AM

## 2017-05-04 NOTE — Anesthesia Preprocedure Evaluation (Signed)
Anesthesia Evaluation  Patient identified by MRN, date of birth, ID band Patient awake    Reviewed: Allergy & Precautions, NPO status , Patient's Chart, lab work & pertinent test results  Airway Mallampati: II  TM Distance: >3 FB Neck ROM: Full    Dental no notable dental hx.    Pulmonary former smoker,    Pulmonary exam normal breath sounds clear to auscultation       Cardiovascular hypertension, Pt. on medications Normal cardiovascular exam Rhythm:Regular Rate:Normal     Neuro/Psych negative neurological ROS  negative psych ROS   GI/Hepatic negative GI ROS, Neg liver ROS,   Endo/Other  negative endocrine ROS  Renal/GU negative Renal ROS  negative genitourinary   Musculoskeletal  (+) Arthritis ,   Abdominal (+) + obese,   Peds negative pediatric ROS (+)  Hematology negative hematology ROS (+)   Anesthesia Other Findings Past Medical History: No date: Arthritis     Comment:  oa No date: Hypertension  Reproductive/Obstetrics negative OB ROS                             Anesthesia Physical  Anesthesia Plan  ASA: II  Anesthesia Plan: General   Post-op Pain Management:    Induction: Intravenous  PONV Risk Score and Plan:   Airway Management Planned: Nasal Cannula  Additional Equipment:   Intra-op Plan:   Post-operative Plan:   Informed Consent: I have reviewed the patients History and Physical, chart, labs and discussed the procedure including the risks, benefits and alternatives for the proposed anesthesia with the patient or authorized representative who has indicated his/her understanding and acceptance.   Dental advisory given  Plan Discussed with: CRNA  Anesthesia Plan Comments: (Discussed risks and benefits of and differences between spinal and general. Discussed risks of spinal including headache, backache, failure, bleeding and hematoma, infection, and nerve  damage and paralysis. Patient consents to spinal. Questions answered. Coagulation studies and platelet count acceptable. )        Anesthesia Quick Evaluation

## 2017-05-05 ENCOUNTER — Encounter: Payer: Self-pay | Admitting: Gastroenterology

## 2017-05-07 ENCOUNTER — Ambulatory Visit (INDEPENDENT_AMBULATORY_CARE_PROVIDER_SITE_OTHER): Payer: BLUE CROSS/BLUE SHIELD | Admitting: Family Medicine

## 2017-05-07 ENCOUNTER — Encounter: Payer: Self-pay | Admitting: Family Medicine

## 2017-05-07 ENCOUNTER — Other Ambulatory Visit: Payer: Self-pay

## 2017-05-07 DIAGNOSIS — I10 Essential (primary) hypertension: Secondary | ICD-10-CM

## 2017-05-07 DIAGNOSIS — E785 Hyperlipidemia, unspecified: Secondary | ICD-10-CM | POA: Diagnosis not present

## 2017-05-07 MED ORDER — LISINOPRIL 20 MG PO TABS: 30.0000 mg | ORAL_TABLET | Freq: Every day | ORAL | 0 refills | Status: DC

## 2017-05-07 MED ORDER — GEMFIBROZIL 600 MG PO TABS: ORAL_TABLET | ORAL | 0 refills | Status: DC

## 2017-05-07 NOTE — Progress Notes (Signed)
Name: Lawrence Thornton.   MRN: [id number]    DOB: 13-Sep-1955   Date:05/07/2017       Progress Note  Subjective  Chief Complaint  Chief Complaint  Patient presents with  . Hypertension    BP little high since his neck hurting but denies any other issues   . Hyperlipidemia  . Medication Refill    Hypertension  This is a chronic problem. The problem is unchanged. The problem is controlled. Pertinent negatives include no blurred vision, chest pain, headaches, palpitations or shortness of breath. Past treatments include ACE inhibitors. There is no history of kidney disease, CAD/MI or CVA.  Hyperlipidemia  This is a chronic problem. The problem is controlled. Recent lipid tests were reviewed and are normal. Pertinent negatives include no chest pain, leg pain, myalgias or shortness of breath. Current antihyperlipidemic treatment includes statins.     Past Medical History:  Diagnosis Date  . Arthritis    oa  . Hypertension     Past Surgical History:  Procedure Laterality Date  . APPENDECTOMY  age 9  . BACK SURGERY  lower back surgery x 3    L 3 L 4 and L 5 are fused together  . COLONOSCOPY WITH PROPOFOL N/A 05/04/2017   Procedure: COLONOSCOPY WITH PROPOFOL;  Surgeon: Midge Minium, MD;  Location[facility] ENDOSCOPY;  Service: Endoscopy;  Laterality: N/A;  . JOINT REPLACEMENT    . LUMBAR FUSION  1999   L- to L5  . metal removed from eye  years ago  . sebaceous cyst removed Right 15 years ago   middle  finger   . TOE FUSION Right    4th toe  . 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 TOTAL KNEE ARTHROPLASTY Right 12/21/2014   Procedure: RIGHT TOTAL KNEE ARTHROPLASTY;  Surgeon: Eugenia Mcalpine, MD;  Location: [facility];  Service: Orthopedics;  Laterality: Right;  . TOTAL KNEE ARTHROPLASTY Right 12/21/14    Family History  Problem Relation Age of Onset   . Cancer Mother        Throat  . Heart attack Mother   . Hypertension Father   . Hyperlipidemia Father     Social History   Socioeconomic History  . Marital status: Married    Spouse name: Not on file  . Number of children: Not on file  . Years of education: Not on file  . Highest education level: Not on file  Social Needs  . Financial resource strain: Not on file  . Food insecurity - worry: Not on file  . Food insecurity - inability: Not on file  . Transportation needs - medical: Not on file  . Transportation needs - non-medical: Not on file  Occupational History  . Not on file  Tobacco Use  . Smoking status: Former Smoker    Last attempt to quit: 04/07/1999    Years since quitting: 18.0  . Smokeless tobacco: Never Used  Substance and Sexual Activity  . Alcohol use: No  . Drug use: No  . Sexual activity: Yes    Birth control/protection: Condom  Other Topics Concern  . Not on file  Social History Narrative  . Not on file     Current Outpatient Medications:  .  doxazosin (CARDURA) 8 MG tablet, take 1 tablet by mouth at bedtime, Disp: 90 tablet, Rfl: 3 .  gemfibrozil (LOPID) 600 MG  tablet, take 1 tablet by mouth twice a day before meals, Disp: 180 tablet, Rfl: 0 .  linaclotide (LINZESS) 145 MCG CAPS capsule, Take 1 capsule (145 mcg total) by mouth daily., Disp: 30 capsule, Rfl: 0 .  lisinopril (PRINIVIL,ZESTRIL) 20 MG tablet, Take 1.5 tablets (30 mg total) by mouth at bedtime., Disp: 135 tablet, Rfl: 0 .  oxyCODONE (OXYCONTIN) 40 mg 12 hr tablet, Take 1 tablet (40 mg total) by mouth every 12 (twelve) hours. For chronic pain. To last for 30 days from fill date.  DNF: 04/03/17, 05/03/17, Disp: 60 tablet, Rfl: 0 .  oxyCODONE-acetaminophen (PERCOCET) 10-325 MG tablet, Take 1 tablet by mouth 2 (two) times daily as needed for pain. For chronic pain. To last for 30 days from fill date.  DNF: 04/03/17, 05/03/17, Disp: 60 tablet, Rfl: 0 .  tiZANidine (ZANAFLEX) 4 MG tablet, Take 1  tablet (4 mg total) by mouth at bedtime., Disp: 30 tablet, Rfl: 2  No Known Allergies   Review of Systems  Eyes: Negative for blurred vision.  Respiratory: Negative for shortness of breath.   Cardiovascular: Negative for chest pain and palpitations.  Musculoskeletal: Negative for myalgias.  Neurological: Negative for headaches.    Objective  Vitals:   05/07/17 1009  BP: 140/76  Pulse: 97  Resp: 14  Temp: 98.7 F (37.1 C)  TempSrc: Oral  SpO2: 98%  Weight: 292 lb 3.2 oz (132.5 kg)    Physical Exam  Constitutional: He is oriented to person, place, and time and well-developed, well-nourished, and in no distress.  HENT:  Head: Normocephalic and atraumatic.  Cardiovascular: Normal rate, regular rhythm and normal heart sounds.  Pulmonary/Chest: Effort normal and breath sounds normal.  Abdominal: Soft. Bowel sounds are normal.  Musculoskeletal: He exhibits no edema.  Neurological: He is alert and oriented to person, place, and time.  Psychiatric: Mood, memory, affect and judgment normal.  Nursing note and vitals reviewed.      Assessment & Plan  1. Dyslipidemia  - gemfibrozil (LOPID) 600 MG tablet; take 1 tablet by mouth twice a day before meals  Dispense: 180 tablet; Refill: 0 - Lipid panel - COMPLETE METABOLIC PANEL WITH GFR  2. Essential hypertension  - lisinopril (PRINIVIL,ZESTRIL) 20 MG tablet; Take 1.5 tablets (30 mg total) by mouth at bedtime.  Dispense: 135 tablet; Refill: 0   Lawrence Thornton [facility] 05/07/2017 10:19 AM

## 2017-05-07 NOTE — Progress Notes (Signed)
erroneous

## 2017-05-13 ENCOUNTER — Other Ambulatory Visit: Payer: Self-pay

## 2017-05-13 ENCOUNTER — Encounter: Payer: Self-pay | Admitting: Student in an Organized Health Care Education/Training Program

## 2017-05-13 ENCOUNTER — Ambulatory Visit
Payer: BLUE CROSS/BLUE SHIELD | Attending: Student in an Organized Health Care Education/Training Program | Admitting: Student in an Organized Health Care Education/Training Program

## 2017-05-13 VITALS — BP 154/73 | HR 84 | Temp 98.3°F | Resp 16 | Ht 72.0 in | Wt 280.0 lb

## 2017-05-13 DIAGNOSIS — M5136 Other intervertebral disc degeneration, lumbar region: Secondary | ICD-10-CM | POA: Diagnosis not present

## 2017-05-13 DIAGNOSIS — M170 Bilateral primary osteoarthritis of knee: Secondary | ICD-10-CM | POA: Diagnosis not present

## 2017-05-13 DIAGNOSIS — E785 Hyperlipidemia, unspecified: Secondary | ICD-10-CM | POA: Insufficient documentation

## 2017-05-13 DIAGNOSIS — R739 Hyperglycemia, unspecified: Secondary | ICD-10-CM | POA: Insufficient documentation

## 2017-05-13 DIAGNOSIS — Z79891 Long term (current) use of opiate analgesic: Secondary | ICD-10-CM | POA: Diagnosis not present

## 2017-05-13 DIAGNOSIS — G894 Chronic pain syndrome: Secondary | ICD-10-CM | POA: Diagnosis not present

## 2017-05-13 DIAGNOSIS — Z79899 Other long term (current) drug therapy: Secondary | ICD-10-CM | POA: Diagnosis not present

## 2017-05-13 DIAGNOSIS — E781 Pure hyperglyceridemia: Secondary | ICD-10-CM | POA: Insufficient documentation

## 2017-05-13 DIAGNOSIS — K5909 Other constipation: Secondary | ICD-10-CM | POA: Insufficient documentation

## 2017-05-13 DIAGNOSIS — I10 Essential (primary) hypertension: Secondary | ICD-10-CM | POA: Diagnosis not present

## 2017-05-13 DIAGNOSIS — G8929 Other chronic pain: Secondary | ICD-10-CM

## 2017-05-13 DIAGNOSIS — Z96653 Presence of artificial knee joint, bilateral: Secondary | ICD-10-CM | POA: Diagnosis not present

## 2017-05-13 DIAGNOSIS — Z5181 Encounter for therapeutic drug level monitoring: Secondary | ICD-10-CM | POA: Diagnosis not present

## 2017-05-13 DIAGNOSIS — M545 Low back pain, unspecified: Secondary | ICD-10-CM

## 2017-05-13 DIAGNOSIS — M25511 Pain in right shoulder: Secondary | ICD-10-CM | POA: Insufficient documentation

## 2017-05-13 DIAGNOSIS — M542 Cervicalgia: Secondary | ICD-10-CM | POA: Diagnosis not present

## 2017-05-13 DIAGNOSIS — Z87891 Personal history of nicotine dependence: Secondary | ICD-10-CM | POA: Diagnosis not present

## 2017-05-13 DIAGNOSIS — Z981 Arthrodesis status: Secondary | ICD-10-CM

## 2017-05-13 DIAGNOSIS — M62830 Muscle spasm of back: Secondary | ICD-10-CM | POA: Diagnosis not present

## 2017-05-13 MED ORDER — OXYCODONE HCL ER 40 MG PO T12A: 40.0000 mg | EXTENDED_RELEASE_TABLET | Freq: Two times a day (BID) | ORAL | 0 refills | Status: DC

## 2017-05-13 MED ORDER — OXYCODONE-ACETAMINOPHEN 10-325 MG PO TABS: 1.0000 | ORAL_TABLET | Freq: Two times a day (BID) | ORAL | 0 refills | Status: DC | PRN

## 2017-05-13 MED ORDER — TIZANIDINE HCL 4 MG PO TABS: 4.0000 mg | ORAL_TABLET | Freq: Every day | ORAL | 2 refills | Status: DC

## 2017-05-13 NOTE — Progress Notes (Signed)
Nursing Pain Medication Assessment:  Safety precautions to be maintained throughout the outpatient stay will include: orient to surroundings, keep bed in low position, maintain call bell within reach at all times, provide assistance with transfer out of bed and ambulation.  Medication Inspection Compliance: Pill count conducted under aseptic conditions, in front of the patient. Neither the pills nor the bottle was removed from the patient's sight at any time. Once count was completed pills were immediately returned to the patient in their original bottle.  Medication #1: Oxycodone ER (OxyContin) Pill/Patch Count: 42 of 60 pills remain Pill/Patch Appearance: Markings consistent with prescribed medication Bottle Appearance: Standard pharmacy container. Clearly labeled. Filled Date: 1 / 28 / 2019 Last Medication intake:  Yesterday  Medication #2: Oxycodone/APAP Pill/Patch Count: 42 of 60 pills remain Pill/Patch Appearance: Markings consistent with prescribed medication Bottle Appearance: Standard pharmacy container. Clearly labeled. Filled Date: 1 / 7028 / 2019 Last Medication intake:  Yesterday

## 2017-05-13 NOTE — Patient Instructions (Signed)
____________________________________________________________________________________________ ° °Genicular Nerve Block ° °What is a genicular nerve block? °A genicular nerve block is the injection of a local anesthetic to block the nerves that transmits pain from the knee. ° °What is the purpose of a facet nerve block? °A genicular nerve block is a diagnostic procedure to determine if the pathologic changes (i.e. arthritis, meniscal tears, etc) and inflammation within the knee joint is the source of your knee pain. It also confirms that the knee pain will respond well to the actual treatment procedure. If a genicular nerve block works, it will give you relief for several hours. After that, the pain is expected to return to normal. This test is always performed twice (usually a week or two apart) because two successful tests are required to move onto treatment. If both diagnostic tests are positive, then we schedule a treatment called radiofrequency (RF) ablation. In this procedure, the same nerves are cauterized, which typically leads to pain relief for 4 -18 months. If this process works well for one knee, it can be performed on the other knee if needed. ° °How is the procedure performed? °You will be placed on the procedure table. The injection site is sterilized with either iodine or chlorhexadine. The site to be injected is numbed with a local anesthetic, and a needle is directed to the target area. X-ray guidance is used to ensure proper placement and positioning of the needle. When the needle is properly positioned near the genicular nerve, local anesthetic is injected to numb that nerve. This will be repeated at multiple sites around the knee to block all genicular nerves. ° °Will the procedure be painful? °The injection can be painful and we therefore provide the option of receiving IV sedation. IV sedation, combined with local anesthetic, can make the injection nearly pain free. It allows you to remain very  still during the procedure, which can also make the injection easier, faster, and more successful. If you decide to have IV sedation, you must have a driver to get you home safely afterwards. In addition, you cannot have anything to eat or drink within 8 hours of your appointment (clear liquids are allowed until 3 hours before the procedure). If you take medications for diabetes, these medications may need to be adjusted the morning of the procedure. Your primary care physician can help you with this adjustment. ° °What are the discharge instructions? °If you received IV sedation do not drive or operate machinery for at least 24 hours after the procedure. You may return to work the next day following your procedure. You may resume your normal diet immediately. Do not engage in any strenuous activity for 24 hours. You should, however, engage in moderate activity that typically causes your ususal pain. If the block works, those activities should not be painful for several hours after the injection. Do not take a bath, swim, or use a hot tub for 24 hours (you may take a shower). Call the office if you have any of the following: severe pain afterwards (different than your usual symptoms), redness/swelling/discharge at the injection site(s), fevers/chills, difficulty with bowel or bladder functions. ° °What are the risks and side effects? °The complication rate for this procedure is very low. Whenever a needle enters the skin, bleeding or infection can occur. Some other serious but extremely rare risks include paralysis and death. °You may have an allergic reaction to any of the medications used. If you have a known allergy to any medications, especially local anesthetics, notify   our staff before the procedure takes place. °You may experience any of the following side effects up to 4 - 6 hours after the procedure: °• Leg muscle weakness or numbness may occur due to the local anesthetic affecting the nerves that control  your legs (this is a temporary affect and it is not paralysis). If you have any leg weakness or numbness, walk only with assistance in order to prevent falls and injury. Your leg strength will return slowly and completely. °• Dizziness may occur due to a decrease in your blood pressure. If this occurs, remain in a seated or lying position. Gradually sit up, and then stand after at least 10 minutes of sitting. °• Mild headaches may occur. Drink fluids and take pain medications if needed. If the headaches persist or become severe, call the office. °• Mild discomfort at the injection site can occur. This typically lasts for a few hours but can persist for a couple days. If this occurs, take anti-inflammatories or pain medications, apply ice to the area the day of the procedure. If it persists, apply moist heat in the day(s) following. ° °The side effects listed above can be normal. They are not dangerous and will resolve on their own. If, however, you experience any of the following, a complication may have occurred and you should either contact your doctor. If he is not readily available, then you should proceed to the closest urgent care center for evaluation: °• Severe or progressive pain at the injection site(s) °• Arm or leg weakness that progressively worsens or persists for longer than 8 hours °• Severe or progressive redness, swelling, or discharge from the injections site(s) °• Fevers, chills, nausea, or vomiting °• Bowel or bladder dysfunction (i.e. inability to urinate or pass stool or difficulty controlling either) ° °How long does it take for the procedure to work? °You should feel relief from your usual pain within the first hour. Again, this is only expected to last for several hours, at the most. Remember, you may be sore in the middle part of your back from the needles, and you must distinguish this from your usual  pain. °____________________________________________________________________________________________ ° ° °

## 2017-05-13 NOTE — Progress Notes (Signed)
Patient's Name: Lawrence Thornton.  MRN: [id number]  Referring Provider: Roselee Nova, MD  DOB: 11-07-1955  PCP: Roselee Nova, MD  DOS: 05/13/2017  Note by: Gillis Santa, MD  Service setting: Ambulatory outpatient  Specialty: Interventional Pain Management  Location: [facility] (AMB) Pain Management Facility    Patient type: Established   Primary Reason(s) for Visit: Encounter for prescription drug management. (Level of risk: moderate)  CC: Back Pain (lower); Neck Pain; and Shoulder Pain (right)  HPI  Lawrence Thornton is a 62 y.o. year old, male patient, who comes today for a medication management evaluation. He has Primary osteoarthritis of left knee; Status post total left knee replacement using cement; S/P knee surgery; Chronic low back pain; BPH associated with nocturia; Dyslipidemia; Continuous opioid dependence ([place]); Chronic LBP; Chronic constipation; Benign fibroma of prostate; DDD (degenerative disc disease), lumbar; Failure of erection; BP (high blood pressure); Long term current use of opiate analgesic; S/P knee replacement; Osteoarthritis of right knee; Gonalgia; History of colon polyps; Hypertriglyceridemia; Need for prophylactic vaccination and inoculation against influenza; Congested; Extreme obesity; Preop examination; Screening for gout; Need for vaccination with Comvax; Constipation due to opioid therapy; Hyperglycemia; Chest pain; and Personal history of colonic polyps on their problem list. His primarily concern today is the Back Pain (lower); Neck Pain; and Shoulder Pain (right)  Pain Assessment: Location: Lower Back Radiating: denies Onset: More than a month ago Duration: Chronic pain Quality: Constant, Sharp, Dull Severity: 3 /10 (self-reported pain score)  Note: Reported level is compatible with observation.                         When using our objective Pain Scale, levels between 6 and 10/10 are said to belong in an emergency room, as it progressively worsens from a  6/10, described as severely limiting, requiring emergency care not usually available at an outpatient pain management facility. At a 6/10 level, communication becomes difficult and requires great effort. Assistance to reach the emergency department may be required. Facial flushing and profuse sweating along with potentially dangerous increases in heart rate and blood pressure will be evident. Effect on ADL:   Timing: Constant Modifying factors: medications, ice, exercise  Lawrence Thornton was last scheduled for an appointment on 03/25/2017 for medication management. During today's appointment we reviewed Lawrence Thornton's chronic pain status, as well as his outpatient medication regimen.  The patient  reports that he does not use drugs. His body mass index is 37.97 kg/m.  Further details on both, my assessment(s), as well as the proposed treatment plan, please see below.  Controlled Substance Pharmacotherapy Assessment REMS (Risk Evaluation and Mitigation Strategy)  PMP and historical list of controlled substances:Percocet 10 mg 2 times daily as needed for breakthrough, OxyContin 40 mg twice daily. MME/day:Percocet equals 30, OxyContin equals 171m/daytotal equals 150 (reduced from 165)  WRise Patience 05/13/2017  8:36 AM  Signed Nursing Pain Medication Assessment:  Safety precautions to be maintained throughout the outpatient stay will include: orient to surroundings, keep bed in low position, maintain call bell within reach at all times, provide assistance with transfer out of bed and ambulation.  Medication Inspection Compliance: Pill count conducted under aseptic conditions, in front of the patient. Neither the pills nor the bottle was removed from the patient's sight at any time. Once count was completed pills were immediately returned to the patient in their original bottle.  Medication #1: Oxycodone ER (OxyContin) Pill/Patch Count: 42 of  60 pills remain Pill/Patch Appearance: Markings  consistent with prescribed medication Bottle Appearance: Standard pharmacy container. Clearly labeled. Filled Date: 1 / 28 / 2019 Last Medication intake:  Yesterday  Medication #2: Oxycodone/APAP Pill/Patch Count: 42 of 60 pills remain Pill/Patch Appearance: Markings consistent with prescribed medication Bottle Appearance: Standard pharmacy container. Clearly labeled. Filled Date: 1 / 28 / 2019 Last Medication intake:  Yesterday Pharmacokinetics: Liberation and absorption (onset of action): WNL Distribution (time to peak effect): WNL Metabolism and excretion (duration of action): WNL         Pharmacodynamics: Desired effects: Analgesia: Lawrence Thornton reports >50% benefit. Functional ability: Patient reports that medication allows him to accomplish basic ADLs Clinically meaningful improvement in function (CMIF): Sustained CMIF goals met Perceived effectiveness: Described as relatively effective, allowing for increase in activities of daily living (ADL) Undesirable effects: Side-effects or Adverse reactions: None reported Monitoring: [place]MP: Online review of the past 20-monthperiod conducted. Compliant with practice rules and regulations Last UDS on record: Summary  Date Value Ref Range Status  03/11/2017 FINAL  Final    Comment:    ==================================================================== TOXASSURE COMP DRUG ANALYSIS,UR ==================================================================== Test                             Result       Flag       Units Drug Present and Declared for Prescription Verification   Oxycodone                      2974         EXPECTED   ng/mg creat   Oxymorphone                    1740         EXPECTED   ng/mg creat   Noroxycodone                   2878         EXPECTED   ng/mg creat   Noroxymorphone                 420          EXPECTED   ng/mg creat    Sources of oxycodone are scheduled prescription medications.    Oxymorphone, noroxycodone,  and noroxymorphone are expected    metabolites of oxycodone. Oxymorphone is also available as a    scheduled prescription medication.   Acetaminophen                  PRESENT      EXPECTED Drug Absent but Declared for Prescription Verification   Tizanidine                     Not Detected UNEXPECTED    Tizanidine, as indicated in the declared medication list, is not    always detected even when used as directed. ==================================================================== Test                      Result    Flag   Units      Ref Range   Creatinine              112              mg/dL      >=20 ==================================================================== Declared Medications:  The flagging and interpretation on this report are based on  the  following declared medications.  Unexpected results may arise from  inaccuracies in the declared medications.  **Note: The testing scope of this panel includes these medications:  Oxycodone  Oxycodone (Oxycodone Acetaminophen)  **Note: The testing scope of this panel does not include small to  moderate amounts of these reported medications:  Acetaminophen (Oxycodone Acetaminophen)  Tizanidine  **Note: The testing scope of this panel does not include following  reported medications:  Doxazosin  Gemfibrozil  Linaclotide  Lisinopril ==================================================================== For clinical consultation, please call [phone]. ====================================================================    UDS interpretation: Compliant          Medication Assessment Form: Reviewed. Patient indicates being compliant with therapy Treatment compliance: Compliant Risk Assessment Profile: Aberrant behavior: See prior evaluations. None observed or detected today Comorbid factors increasing risk of overdose: See prior notes. No additional risks detected today Risk of substance use disorder (SUD): Low Opioid Risk Tool  - 05/13/17 0827      Family History of Substance Abuse   Alcohol  Negative    Illegal Drugs  Negative    Rx Drugs  Negative      Personal History of Substance Abuse   Alcohol  Negative    Illegal Drugs  Negative    Rx Drugs  Negative      Age   Age between 2-45 years   No      History of Preadolescent Sexual Abuse   History of Preadolescent Sexual Abuse  Negative or Male      Psychological Disease   Psychological Disease  Negative    Depression  Negative      Total Score   Opioid Risk Tool Scoring  0    Opioid Risk Interpretation  Low Risk      ORT Scoring interpretation table:  Score <3 = Low Risk for SUD  Score between 4-7 = Moderate Risk for SUD  Score >8 = High Risk for Opioid Abuse   Risk Mitigation Strategies:  Patient Counseling: Covered Patient-Prescriber Agreement (PPA): Present and active  Notification to other healthcare providers: Done  Pharmacologic Plan: No change in therapy, at this time.             Laboratory Chemistry  Inflammation Markers (CRP: Acute Phase) (ESR: Chronic Phase) No results found for: CRP, ESRSEDRATE, LATICACIDVEN               Rheumatology Markers No results found for: RF, ANA, Therisa Doyne, [facility]              Renal Function Markers Lab Results  Component Value Date   BUN 15 11/11/2015   CREATININE 0.78 11/11/2015   GFRAA 114 11/11/2015   GFRNONAA 99 11/11/2015                 Hepatic Function Markers Lab Results  Component Value Date   AST 18 11/11/2015   ALT 27 11/11/2015   ALBUMIN 4.6 11/11/2015   ALKPHOS 76 11/11/2015   HCVAB NEGATIVE 10/06/2016                 Electrolytes Lab Results  Component Value Date   NA 141 11/11/2015   K 5.1 11/11/2015   CL 100 11/11/2015   CALCIUM 10.0 11/11/2015                 Neuropathy Markers Lab Results  Component Value Date   HGBA1C 6.0 01/07/2017  Bone Pathology Markers Lab Results  Component Value Date   VD25OH 43  10/06/2016                 Coagulation Parameters Lab Results  Component Value Date   INR 1.07 12/13/2014   LABPROT 14.1 12/13/2014   APTT 34 12/13/2014   PLT 252 10/06/2016                 Cardiovascular Markers Lab Results  Component Value Date   HGB 13.6 10/06/2016   HCT 39.3 10/06/2016                 CA Markers No results found for: CEA, CA125, LABCA2               Note: Lab results reviewed.   Meds   Current Outpatient Medications:  .  doxazosin (CARDURA) 8 MG tablet, take 1 tablet by mouth at bedtime, Disp: 90 tablet, Rfl: 3 .  gemfibrozil (LOPID) 600 MG tablet, take 1 tablet by mouth twice a day before meals, Disp: 180 tablet, Rfl: 0 .  linaclotide (LINZESS) 145 MCG CAPS capsule, Take 1 capsule (145 mcg total) by mouth daily., Disp: 30 capsule, Rfl: 0 .  lisinopril (PRINIVIL,ZESTRIL) 20 MG tablet, Take 1.5 tablets (30 mg total) by mouth at bedtime., Disp: 135 tablet, Rfl: 0 .  oxyCODONE (OXYCONTIN) 40 mg 12 hr tablet, Take 1 tablet (40 mg total) by mouth every 12 (twelve) hours. For chronic pain. To last for 30 days from fill date.  DNF: 06/02/17, 07/02/17, Disp: 60 tablet, Rfl: 0 .  oxyCODONE-acetaminophen (PERCOCET) 10-325 MG tablet, Take 1 tablet by mouth 2 (two) times daily as needed for pain. For chronic pain. To last for 30 days from fill date.  DNF: 06/02/17, 07/02/17, Disp: 60 tablet, Rfl: 0 .  tiZANidine (ZANAFLEX) 4 MG tablet, Take 1 tablet (4 mg total) by mouth at bedtime., Disp: 30 tablet, Rfl: 2  ROS  Constitutional: Denies any fever or chills Gastrointestinal: No reported hemesis, hematochezia, vomiting, or acute GI distress Musculoskeletal: Denies any acute onset joint swelling, redness, loss of ROM, or weakness Neurological: No reported episodes of acute onset apraxia, aphasia, dysarthria, agnosia, amnesia, paralysis, loss of coordination, or loss of consciousness  Allergies  Lawrence Thornton has No Known Allergies.  [place]  Drug: Lawrence Thornton  reports that  he does not use drugs. Alcohol:  reports that he does not drink alcohol. Tobacco:  reports that he quit smoking about 18 years ago. he has never used smokeless tobacco. Medical:  has a past medical history of Arthritis and Hypertension. Surgical: Lawrence Thornton  has a past surgical history that includes Back surgery (lower back surgery x 3 ); Appendectomy (age 36); sebaceous cyst removed (Right, 15 years ago); Toe Fusion (Right); metal removed from eye (years ago); Total knee arthroplasty (Left, 08/10/2014); Joint replacement; Total knee arthroplasty (Right, 12/21/2014); Total knee arthroplasty (Right, 12/21/14); Lumbar fusion (1999); and Colonoscopy with propofol (N/A, 05/04/2017). Family: family history includes Cancer in his mother; Heart attack in his mother; Hyperlipidemia in his father; Hypertension in his father.  Constitutional Exam  General appearance: Well nourished, well developed, and well hydrated. In no apparent acute distress Vitals:   05/13/17 0819 05/13/17 0831  BP: (!) 112/96 (!) 154/73  Pulse: 84   Resp: 16   Temp: 98.3 F (36.8 C)   SpO2: 99%   Weight: 280 lb (127 kg)   Height: 6' (1.829 m)    BMI Assessment: Estimated body  mass index is 37.97 kg/m as calculated from the following:   Height as of this encounter: 6' (1.829 m).   Weight as of this encounter: 280 lb (127 kg).  BMI interpretation table: BMI level Category Range association with higher incidence of chronic pain  <18 kg/m2 Underweight   18.5-24.9 kg/m2 Ideal body weight   25-29.9 kg/m2 Overweight Increased incidence by 20%  30-34.9 kg/m2 Obese (Class I) Increased incidence by 68%  35-39.9 kg/m2 Severe obesity (Class II) Increased incidence by 136%  >40 kg/m2 Extreme obesity (Class III) Increased incidence by 254%   BMI Readings from Last 4 Encounters:  05/13/17 37.97 kg/m  05/07/17 39.63 kg/m  05/04/17 37.16 kg/m  03/25/17 37.84 kg/m   Wt Readings from Last 4 Encounters:  05/13/17 280 lb (127 kg)   05/07/17 292 lb 3.2 oz (132.5 kg)  05/04/17 274 lb (124.3 kg)  03/25/17 279 lb (126.6 kg)  Psych/Mental status: Alert, oriented x 3 (person, place, & time)       Eyes: PERLA Respiratory: No evidence of acute respiratory distress  Cervical Spine Area Exam  Skin & Axial Inspection: No masses, redness, edema, swelling, or associated skin lesions Alignment: Symmetrical Functional ROM: Unrestricted ROM      Stability: No instability detected Muscle Tone/Strength: Functionally intact. No obvious neuro-muscular anomalies detected. Sensory (Neurological): Unimpaired Palpation: No palpable anomalies              Upper Extremity (UE) Exam    Side: Right upper extremity  Side: Left upper extremity  Skin & Extremity Inspection: Skin color, temperature, and hair growth are WNL. No peripheral edema or cyanosis. No masses, redness, swelling, asymmetry, or associated skin lesions. No contractures.  Skin & Extremity Inspection: Skin color, temperature, and hair growth are WNL. No peripheral edema or cyanosis. No masses, redness, swelling, asymmetry, or associated skin lesions. No contractures.  Functional ROM: Unrestricted ROM          Functional ROM: Unrestricted ROM          Muscle Tone/Strength: Functionally intact. No obvious neuro-muscular anomalies detected.  Muscle Tone/Strength: Functionally intact. No obvious neuro-muscular anomalies detected.  Sensory (Neurological): Unimpaired          Sensory (Neurological): Unimpaired          Palpation: No palpable anomalies              Palpation: No palpable anomalies              Specialized Test(s): Deferred         Specialized Test(s): Deferred          Thoracic Spine Area Exam  Skin & Axial Inspection: No masses, redness, or swelling Alignment: Symmetrical Functional ROM: Unrestricted ROM Stability: No instability detected Muscle Tone/Strength: Functionally intact. No obvious neuro-muscular anomalies detected. Sensory (Neurological):  Unimpaired Muscle strength & Tone: No palpable anomalies   Lumbar Spine Area Exam  Skin & Axial Inspection:Well healed scar from previous spine surgery detected Alignment:Symmetrical Functional IEP:PIRJJOACZ ROMWith lumbar extension Stability:No instability detected Muscle Tone/Strength:Functionally intact. No obvious neuro-muscular anomalies detected. Sensory (Neurological):Unimpaired Palpation:Complains of area being tender to palpation Provocative Tests: Lumbar Hyperextension and rotation test:Positivebilaterally for facet joint pain. Lumbar Lateral bending test:evaluation deferred today Patrick's Maneuver:Positive bilaterally for SI joint arthralgia.  Gait & Posture Assessment  Ambulation:Unassisted Gait:Relatively normal for age and body habitus Posture:WNL  Lower Extremity Exam    Side:Right lower extremity  Side:Left lower extremity  Skin & Extremity Inspection:Skin color, temperature, and hair growth are  WNL. No peripheral edema or cyanosis. No masses, redness, swelling, asymmetry, or associated skin lesions. No contractures.  Skin & Extremity Inspection:Skin color, temperature, and hair growth are WNL. No peripheral edema or cyanosis. No masses, redness, swelling, asymmetry, or associated skin lesions. No contractures.  Functional BCW:[id number] ROM  Functional UEK:[id number] ROM  Muscle Tone/Strength:Functionally intact. No obvious neuro-muscular anomalies detected.  Muscle Tone/Strength:Functionally intact. No obvious neuro-muscular anomalies detected.  Sensory (Neurological):Unimpaired  Sensory (Neurological):Unimpaired  Palpation:No palpable anomalies  Palpation:No palpable anomalies     Assessment  Primary Diagnosis & Pertinent Problem List: The primary encounter diagnosis was Chronic right-sided low back pain without sciatica. Diagnoses of Chronic pain syndrome, Chronic use of  opiate for therapeutic purpose, S/P lumbar fusion, Lumbar degenerative disc disease, and Spasm of back muscles were also pertinent to this visit.  Status Diagnosis  Controlled Controlled Controlled 1. Chronic right-sided low back pain without sciatica   2. Chronic pain syndrome   3. Chronic use of opiate for therapeutic purpose   4. S/P lumbar fusion   5. Lumbar degenerative disc disease   6. Spasm of back muscles      General Recommendations: The pain condition that the patient suffers from is best treated with a multidisciplinary approach that involves an increase in physical activity to prevent de-conditioning and worsening of the pain cycle, as well as psychological counseling (formal and/or informal) to address the co-morbid psychological affects of pain. Treatment will often involve judicious use of pain medications and interventional procedures to decrease the pain, allowing the patient to participate in the physical activity that will ultimately produce long-lasting pain reductions. The goal of the multidisciplinary approach is to return the patient to a higher level of overall function and to restore their ability to perform activities of daily living.   62 year old male who has been on chronic opioid therapy for greater than 10 years referred from his primary care physician for chronic pain related to his axial lumbar spine and bilateral knees. Patient's primary pain is in his lower back. Patient has an impressive lumbar surgical history which includes an L3-S1 posterior lumbar fusion with a sequelae of rod fracture that is been evaluated by orthopedic surgeon and surgery has not been recommended given how extensive reconstruction would be. Patient has tried cortisone injections, physical therapy, non-opioid analgesics including meloxicam which resulted in shortness of breath, gabapentin which resulted in vivid dreams and impaired cognition, Lyrica which resulted in leg swelling, and  various muscle relaxants only of which tizanidine is somewhat effective. Patient has been stable on his chronic opioid regimen for approximately 8years which includes OxyContin 40 mg twice daily, Percocet 10 mg3 times daily as needed for breakthrough pain.   The patient's last visit, his Percocet was weaned from 10 mg 3 times daily to twice daily as needed.  He states that he is doing okay with a dose reduction.  He is not in a position to wean further at this point.  I told him that we would continue weaning in the future.  Today we will refill his Percocet at 10 mg twice daily as needed, OxyContin 40 mg twice daily.  I had an extensive discussion about opiate-induced hyperalgesia and his total morphine milliequivalents. While patient has been stable on this dose for many years, I would like to continue to wean his total opioid dose down (patient was referred to me at [facility] 165). Patient states that he finds greater benefit with the long-acting OxyContin so I discussed weaning down his Percocet  for breakthrough pain at next visit (weaned from 10 mg TID to BID at last visit).  Plan: -Refill oxycodone and Percocet as below. -Consider weaning Percocet to 5 mg 3 times daily at next visit. -Refill Tizanidine as below  Plan of Care  Pharmacotherapy (Medications Ordered): Meds ordered this encounter  Medications  . DISCONTD: oxyCODONE (OXYCONTIN) 40 mg 12 hr tablet    Sig: Take 1 tablet (40 mg total) by mouth every 12 (twelve) hours. For chronic pain. To last for 30 days from fill date.  DNF: 06/02/17, 07/02/17    Dispense:  60 tablet    Refill:  0  . DISCONTD: oxyCODONE-acetaminophen (PERCOCET) 10-325 MG tablet    Sig: Take 1 tablet by mouth 2 (two) times daily as needed for pain. For chronic pain. To last for 30 days from fill date.  DNF: 06/02/17, 07/02/17    Dispense:  60 tablet    Refill:  0  . tiZANidine (ZANAFLEX) 4 MG tablet    Sig: Take 1 tablet (4 mg total) by mouth at bedtime.     Dispense:  30 tablet    Refill:  2  . oxyCODONE (OXYCONTIN) 40 mg 12 hr tablet    Sig: Take 1 tablet (40 mg total) by mouth every 12 (twelve) hours. For chronic pain. To last for 30 days from fill date.  DNF: 06/02/17, 07/02/17    Dispense:  60 tablet    Refill:  0  . oxyCODONE-acetaminophen (PERCOCET) 10-325 MG tablet    Sig: Take 1 tablet by mouth 2 (two) times daily as needed for pain. For chronic pain. To last for 30 days from fill date.  DNF: 06/02/17, 07/02/17    Dispense:  60 tablet    Refill:  0   Provider-requested follow-up: Return in about 9 weeks (around 07/15/2017) for Medication Management. Time Note: Greater than 50% of the 25 minute(s) of face-to-face time spent with Lawrence Thornton, was spent in counseling/coordination of care regarding: opioid tolerance, "Drug Holidays", Lawrence Thornton's primary cause of pain, medication side effects, the opioid analgesic risks and possible complications, the appropriate use of his medications and the medication agreement. Future Appointments  Date Time Provider [place]  07/15/2017  8:45 AM Gillis Santa, MD [facility] None  08/13/2017  8:00 AM Steele Sizer, MD [facility]    Primary Care Physician: Roselee Nova, MD Location: [facility] Note by: Gillis Santa, M.D Date: 05/13/2017; Time: 10:14 AM  Patient Instructions  ____________________________________________________________________________________________  Genicular Nerve Block  What is a genicular nerve block? A genicular nerve block is the injection of a local anesthetic to block the nerves that transmits pain from the knee.  What is the purpose of a facet nerve block? A genicular nerve block is a diagnostic procedure to determine if the pathologic changes (i.e. arthritis, meniscal tears, etc) and inflammation within the knee joint is the source of your knee pain. It also confirms that the knee pain will respond well to the actual  treatment procedure. If a genicular nerve block works, it will give you relief for several hours. After that, the pain is expected to return to normal. This test is always performed twice (usually a week or two apart) because two successful tests are required to move onto treatment. If both diagnostic tests are positive, then we schedule a treatment called radiofrequency (RF) ablation. In this procedure, the same nerves are cauterized, which typically leads to pain relief for 4 -18 months. If this process  works well for one knee, it can be performed on the other knee if needed.  How is the procedure performed? You will be placed on the procedure table. The injection site is sterilized with either iodine or chlorhexadine. The site to be injected is numbed with a local anesthetic, and a needle is directed to the target area. X-ray guidance is used to ensure proper placement and positioning of the needle. When the needle is properly positioned near the genicular nerve, local anesthetic is injected to numb that nerve. This will be repeated at multiple sites around the knee to block all genicular nerves.  Will the procedure be painful? The injection can be painful and we therefore provide the option of receiving IV sedation. IV sedation, combined with local anesthetic, can make the injection nearly pain free. It allows you to remain very still during the procedure, which can also make the injection easier, faster, and more successful. If you decide to have IV sedation, you must have a driver to get you home safely afterwards. In addition, you cannot have anything to eat or drink within 8 hours of your appointment (clear liquids are allowed until 3 hours before the procedure). If you take medications for diabetes, these medications may need to be adjusted the morning of the procedure. Your primary care physician can help you with this adjustment.  What are the discharge instructions? If you received IV sedation  do not drive or operate machinery for at least 24 hours after the procedure. You may return to work the next day following your procedure. You may resume your normal diet immediately. Do not engage in any strenuous activity for 24 hours. You should, however, engage in moderate activity that typically causes your ususal pain. If the block works, those activities should not be painful for several hours after the injection. Do not take a bath, swim, or use a hot tub for 24 hours (you may take a shower). Call the office if you have any of the following: severe pain afterwards (different than your usual symptoms), redness/swelling/discharge at the injection site(s), fevers/chills, difficulty with bowel or bladder functions.  What are the risks and side effects? The complication rate for this procedure is very low. Whenever a needle enters the skin, bleeding or infection can occur. Some other serious but extremely rare risks include paralysis and death. You may have an allergic reaction to any of the medications used. If you have a known allergy to any medications, especially local anesthetics, notify our staff before the procedure takes place. You may experience any of the following side effects up to 4 - 6 hours after the procedure: . Leg muscle weakness or numbness may occur due to the local anesthetic affecting the nerves that control your legs (this is a temporary affect and it is not paralysis). If you have any leg weakness or numbness, walk only with assistance in order to prevent falls and injury. Your leg strength will return slowly and completely. . Dizziness may occur due to a decrease in your blood pressure. If this occurs, remain in a seated or lying position. Gradually sit up, and then stand after at least 10 minutes of sitting. . Mild headaches may occur. Drink fluids and take pain medications if needed. If the headaches persist or become severe, call the office. . Mild discomfort at the injection  site can occur. This typically lasts for a few hours but can persist for a couple days. If this occurs, take anti-inflammatories or pain medications,  apply ice to the area the day of the procedure. If it persists, apply moist heat in the day(s) following.  The side effects listed above can be normal. They are not dangerous and will resolve on their own. If, however, you experience any of the following, a complication may have occurred and you should either contact your doctor. If he is not readily available, then you should proceed to the closest urgent care center for evaluation: . Severe or progressive pain at the injection site(s) . Arm or leg weakness that progressively worsens or persists for longer than 8 hours . Severe or progressive redness, swelling, or discharge from the injections site(s) . Fevers, chills, nausea, or vomiting . Bowel or bladder dysfunction (i.e. inability to urinate or pass stool or difficulty controlling either)  How long does it take for the procedure to work? You should feel relief from your usual pain within the first hour. Again, this is only expected to last for several hours, at the most. Remember, you may be sore in the middle part of your back from the needles, and you must distinguish this from your usual pain. ____________________________________________________________________________________________

## 2017-07-13 ENCOUNTER — Other Ambulatory Visit: Payer: Self-pay

## 2017-07-13 ENCOUNTER — Encounter: Payer: Self-pay | Admitting: Student in an Organized Health Care Education/Training Program

## 2017-07-13 ENCOUNTER — Ambulatory Visit
Payer: BLUE CROSS/BLUE SHIELD | Attending: Student in an Organized Health Care Education/Training Program | Admitting: Student in an Organized Health Care Education/Training Program

## 2017-07-13 VITALS — BP 143/82 | HR 74 | Temp 99.4°F | Resp 18 | Ht 72.0 in | Wt 280.0 lb

## 2017-07-13 DIAGNOSIS — G8929 Other chronic pain: Secondary | ICD-10-CM | POA: Diagnosis not present

## 2017-07-13 DIAGNOSIS — Z981 Arthrodesis status: Secondary | ICD-10-CM | POA: Diagnosis not present

## 2017-07-13 DIAGNOSIS — E669 Obesity, unspecified: Secondary | ICD-10-CM | POA: Diagnosis not present

## 2017-07-13 DIAGNOSIS — Z79891 Long term (current) use of opiate analgesic: Secondary | ICD-10-CM

## 2017-07-13 DIAGNOSIS — M545 Low back pain, unspecified: Secondary | ICD-10-CM

## 2017-07-13 DIAGNOSIS — R079 Chest pain, unspecified: Secondary | ICD-10-CM | POA: Insufficient documentation

## 2017-07-13 DIAGNOSIS — Z87891 Personal history of nicotine dependence: Secondary | ICD-10-CM | POA: Insufficient documentation

## 2017-07-13 DIAGNOSIS — K5909 Other constipation: Secondary | ICD-10-CM | POA: Diagnosis not present

## 2017-07-13 DIAGNOSIS — Z79899 Other long term (current) drug therapy: Secondary | ICD-10-CM | POA: Diagnosis not present

## 2017-07-13 DIAGNOSIS — Z760 Encounter for issue of repeat prescription: Secondary | ICD-10-CM | POA: Insufficient documentation

## 2017-07-13 DIAGNOSIS — E781 Pure hyperglyceridemia: Secondary | ICD-10-CM | POA: Insufficient documentation

## 2017-07-13 DIAGNOSIS — M5136 Other intervertebral disc degeneration, lumbar region: Secondary | ICD-10-CM | POA: Insufficient documentation

## 2017-07-13 DIAGNOSIS — M549 Dorsalgia, unspecified: Secondary | ICD-10-CM | POA: Diagnosis not present

## 2017-07-13 DIAGNOSIS — G894 Chronic pain syndrome: Secondary | ICD-10-CM | POA: Diagnosis not present

## 2017-07-13 DIAGNOSIS — R739 Hyperglycemia, unspecified: Secondary | ICD-10-CM | POA: Diagnosis not present

## 2017-07-13 DIAGNOSIS — Z96653 Presence of artificial knee joint, bilateral: Secondary | ICD-10-CM | POA: Diagnosis not present

## 2017-07-13 DIAGNOSIS — M62830 Muscle spasm of back: Secondary | ICD-10-CM | POA: Diagnosis not present

## 2017-07-13 DIAGNOSIS — Z86010 Personal history of colonic polyps: Secondary | ICD-10-CM | POA: Insufficient documentation

## 2017-07-13 DIAGNOSIS — M170 Bilateral primary osteoarthritis of knee: Secondary | ICD-10-CM | POA: Diagnosis not present

## 2017-07-13 DIAGNOSIS — I10 Essential (primary) hypertension: Secondary | ICD-10-CM | POA: Diagnosis not present

## 2017-07-13 DIAGNOSIS — Z9889 Other specified postprocedural states: Secondary | ICD-10-CM | POA: Diagnosis not present

## 2017-07-13 DIAGNOSIS — N401 Enlarged prostate with lower urinary tract symptoms: Secondary | ICD-10-CM | POA: Diagnosis not present

## 2017-07-13 DIAGNOSIS — M51369 Other intervertebral disc degeneration, lumbar region without mention of lumbar back pain or lower extremity pain: Secondary | ICD-10-CM

## 2017-07-13 MED ORDER — OXYCODONE HCL ER 40 MG PO T12A: 40.0000 mg | EXTENDED_RELEASE_TABLET | Freq: Two times a day (BID) | ORAL | 0 refills | Status: DC

## 2017-07-13 MED ORDER — OXYCODONE-ACETAMINOPHEN 10-325 MG PO TABS: 1.0000 | ORAL_TABLET | Freq: Two times a day (BID) | ORAL | 0 refills | Status: DC | PRN

## 2017-07-13 NOTE — Patient Instructions (Signed)
You have been given 2 scrips today for oxycodone and 2 scripts for oxycontin.

## 2017-07-13 NOTE — Progress Notes (Signed)
Patient's Name: Lawrence Thornton.  MRN: [id number]  Referring Provider: Roselee Nova, MD  DOB: 1955/11/20  PCP: Roselee Nova, MD  DOS: 07/13/2017  Note by: Gillis Santa, MD  Service setting: Ambulatory outpatient  Specialty: Interventional Pain Management  Location: [facility] (AMB) Pain Management Facility    Patient type: Established   Primary Reason(s) for Visit: Encounter for prescription drug management. (Level of risk: moderate)  CC: Back Pain (low right)  HPI  Lawrence Thornton is a 62 y.o. year old, male patient, who comes today for a medication management evaluation. He has Primary osteoarthritis of left knee; Status post total left knee replacement using cement; S/P knee surgery; Chronic low back pain; BPH associated with nocturia; Dyslipidemia; Continuous opioid dependence ([place]); Chronic LBP; Chronic constipation; Benign fibroma of prostate; DDD (degenerative disc disease), lumbar; Failure of erection; BP (high blood pressure); Long term current use of opiate analgesic; S/P knee replacement; Osteoarthritis of right knee; Gonalgia; History of colon polyps; Hypertriglyceridemia; Need for prophylactic vaccination and inoculation against influenza; Congested; Extreme obesity; Preop examination; Screening for gout; Need for vaccination with Comvax; Constipation due to opioid therapy; Hyperglycemia; Chest pain; Personal history of colonic polyps; S/P lumbar fusion; Chronic use of opiate for therapeutic purpose; and Chronic pain syndrome on their problem list. His primarily concern today is the Back Pain (low right)  Pain Assessment: Location: Lower, Right Back Radiating: denies Onset: More than a month ago Duration: Chronic pain Quality: Dull, [place] Ard Severity: 4 /10 (self-reported pain score)  Note: Reported level is compatible with observation.                         When using our objective Pain Scale, levels between 6 and 10/10 are said to belong in an emergency room, as it  progressively worsens from a 6/10, described as severely limiting, requiring emergency care not usually available at an outpatient pain management facility. At a 6/10 level, communication becomes difficult and requires great effort. Assistance to reach the emergency department may be required. Facial flushing and profuse sweating along with potentially dangerous increases in heart rate and blood pressure will be evident. Effect on ADL:   Timing: Intermittent Modifying factors: medication  Mr. Skowron was last scheduled for an appointment on 05/13/2017 for medication management. During today's appointment we reviewed Mr. Glassberg's chronic pain status, as well as his outpatient medication regimen.  The patient  reports that he does not use drugs. His body mass index is 37.97 kg/m.  Further details on both, my assessment(s), as well as the proposed treatment plan, please see below.  Controlled Substance Pharmacotherapy Assessment REMS (Risk Evaluation and Mitigation Strategy)  PMP and historical list of controlled substances:Percocet 10 mg 2 times daily as needed for breakthrough, OxyContin 40 mg twice daily. MME/day:Percocet equals 30, OxyContin equals 116m/daytotal equals 150 (reduced from 165)   TDewayne Shorter RN  07/13/2017  9:05 AM  Sign at close encounter Nursing Pain Medication Assessment:  Safety precautions to be maintained throughout the outpatient stay will include: orient to surroundings, keep bed in low position, maintain call bell within reach at all times, provide assistance with transfer out of bed and ambulation.  Medication Inspection Compliance: Pill count conducted under aseptic conditions, in front of the patient. Neither the pills nor the bottle was removed from the patient's sight at any time. Once count was completed pills were immediately returned to the patient in their original bottle.  Medication #1:  Oxycodone ER (OxyContin) Pill/Patch Count: 37 of 60 pills  remain Pill/Patch Appearance: Markings consistent with prescribed medication Bottle Appearance: Standard pharmacy container. Clearly labeled. Filled Date: 03/ 28 / 2019 Last Medication intake:  Today  Medication #2: Oxycodone IR Pill/Patch Count: 37 of 60 pills remain Pill/Patch Appearance: Markings consistent with prescribed medication Bottle Appearance: Standard pharmacy container. Clearly labeled. Filled Date: 03 / 28 / 2019 Last Medication intake:  Today   Pharmacokinetics: Liberation and absorption (onset of action): WNL Distribution (time to peak effect): WNL Metabolism and excretion (duration of action): WNL         Pharmacodynamics: Desired effects: Analgesia: Mr. Durio reports >50% benefit. Functional ability: Patient reports that medication allows him to accomplish basic ADLs Clinically meaningful improvement in function (CMIF): Sustained CMIF goals met Perceived effectiveness: Described as relatively effective, allowing for increase in activities of daily living (ADL) Undesirable effects: Side-effects or Adverse reactions: None reported Monitoring: [place] PMP: Online review of the past 69-monthperiod conducted. Compliant with practice rules and regulations Last UDS on record: Summary  Date Value Ref Range Status  03/11/2017 FINAL  Final    Comment:    ==================================================================== TOXAS[facility]RE COMP DRUG ANALYSIS,UR ==================================================================== Test                             Result       Flag       Units Drug Present and Declared for Prescription Verification   Oxycodone                      2974         EXPECTED   ng/mg creat   Oxymorphone                    1740         EXPECTED   ng/mg creat   Noroxycodone                   2878         EXPECTED   ng/mg creat   Noroxymorphone                 420          EXPECTED   ng/mg creat    Sources of oxycodone are scheduled prescription  medications.    Oxymorphone, noroxycodone, and noroxymorphone are expected    metabolites of oxycodone. Oxymorphone is also available as a    scheduled prescription medication.   Acetaminophen                  PRESENT      EXPECTED Drug Absent but Declared for Prescription Verification   Tizanidine                     Not Detected UNEXPECTED    Tizanidine, as indicated in the declared medication list, is not    always detected even when used as directed. ==================================================================== Test                      Result    Flag   Units      Ref Range   Creatinine              112              mg/dL      >=20 ==================================================================== Declared Medications:  The  flagging and interpretation on this report are based on the  following declared medications.  Unexpected results may arise from  inaccuracies in the declared medications.  **Note: The testing scope of this panel includes these medications:  Oxycodone  Oxycodone (Oxycodone Acetaminophen)  **Note: The testing scope of this panel does not include small to  moderate amounts of these reported medications:  Acetaminophen (Oxycodone Acetaminophen)  Tizanidine  **Note: The testing scope of this panel does not include following  reported medications:  Doxazosin  Gemfibrozil  Linaclotide  Lisinopril ==================================================================== For clinical consultation, please call [phone]. ====================================================================    UDS interpretation: Compliant          Medication Assessment Form: Reviewed. Patient indicates being compliant with therapy Treatment compliance: Compliant Risk Assessment Profile: Aberrant behavior: See prior evaluations. None observed or detected today Comorbid factors increasing risk of overdose: See prior notes. No additional risks detected today Risk of  substance use disorder (SUD): Low  ORT Scoring interpretation table:  Score <3 = Low Risk for SUD  Score between 4-7 = Moderate Risk for SUD  Score >8 = High Risk for Opioid Abuse   Risk Mitigation Strategies:  Patient Counseling: Covered Patient-Prescriber Agreement (PPA): Present and active  Notification to other healthcare providers: Done  Pharmacologic Plan: No change in therapy, at this time.             Laboratory Chemistry  Inflammation Markers (CRP: Acute Phase) (ESR: Chronic Phase) No results found for: CRP, ESRSEDRATE, LATICACIDVEN                       Rheumatology Markers No results found for: RF, ANA, Therisa Doyne, [facility]                      Renal Function Markers Lab Results  Component Value Date   BUN 15 11/11/2015   CREATININE 0.78 11/11/2015   GFRAA 114 11/11/2015   GFRNONAA 99 11/11/2015                              Hepatic Function Markers Lab Results  Component Value Date   AST 18 11/11/2015   ALT 27 11/11/2015   ALBUMIN 4.6 11/11/2015   ALKPHOS 76 11/11/2015   HCVAB NEGATIVE 10/06/2016                        Electrolytes Lab Results  Component Value Date   NA 141 11/11/2015   K 5.1 11/11/2015   CL 100 11/11/2015   CALCIUM 10.0 11/11/2015                        Neuropathy Markers Lab Results  Component Value Date   HGBA1C 6.0 01/07/2017                        Bone Pathology Markers Lab Results  Component Value Date   VD25OH 43 10/06/2016                         Coagulation Parameters Lab Results  Component Value Date   INR 1.07 12/13/2014   LABPROT 14.1 12/13/2014   APTT 34 12/13/2014   PLT 252 10/06/2016  Cardiovascular Markers Lab Results  Component Value Date   HGB 13.6 10/06/2016   HCT 39.3 10/06/2016                         CA Markers No results found for: CEA, CA125, LABCA2                      Note: Lab results reviewed.   Meds   Current Outpatient Medications:   .  doxazosin (CARDURA) 8 MG tablet, take 1 tablet by mouth at bedtime, Disp: 90 tablet, Rfl: 3 .  gemfibrozil (LOPID) 600 MG tablet, take 1 tablet by mouth twice a day before meals, Disp: 180 tablet, Rfl: 0 .  linaclotide (LINZESS) 145 MCG CAPS capsule, Take 1 capsule (145 mcg total) by mouth daily., Disp: 30 capsule, Rfl: 0 .  lisinopril (PRINIVIL,ZESTRIL) 20 MG tablet, Take 1.5 tablets (30 mg total) by mouth at bedtime., Disp: 135 tablet, Rfl: 0 .  oxyCODONE (OXYCONTIN) 40 mg 12 hr tablet, Take 1 tablet (40 mg total) by mouth every 12 (twelve) hours. For chronic pain. To last for 30 days from fill date.  DNF: 07/31/17, 08/29/17, Disp: 60 tablet, Rfl: 0 .  oxyCODONE-acetaminophen (PERCOCET) 10-325 MG tablet, Take 1 tablet by mouth 2 (two) times daily as needed for pain. For chronic pain. To last for 30 days from fill date.  DNF: 07/31/17, 08/29/17, Disp: 60 tablet, Rfl: 0 .  tiZANidine (ZANAFLEX) 4 MG tablet, Take 1 tablet (4 mg total) by mouth at bedtime., Disp: 30 tablet, Rfl: 2  ROS  Constitutional: Denies any fever or chills Gastrointestinal: No reported hemesis, hematochezia, vomiting, or acute GI distress Musculoskeletal: Denies any acute onset joint swelling, redness, loss of ROM, or weakness Neurological: No reported episodes of acute onset apraxia, aphasia, dysarthria, agnosia, amnesia, paralysis, loss of coordination, or loss of consciousness  Allergies  Mr. Colarusso has No Known Allergies.  [place]  Drug: Mr. Depascale  reports that he does not use drugs. Alcohol:  reports that he does not drink alcohol. Tobacco:  reports that he quit smoking about 18 years ago. He has never used smokeless tobacco. Medical:  has a past medical history of Arthritis and Hypertension. Surgical: Mr. Wolgamott  has a past surgical history that includes Back surgery (lower back surgery x 3 ); Appendectomy (age 63); sebaceous cyst removed (Right, 15 years ago); Toe Fusion (Right); metal removed from eye (years  ago); Total knee arthroplasty (Left, 08/10/2014); Joint replacement; Total knee arthroplasty (Right, 12/21/2014); Total knee arthroplasty (Right, 12/21/14); Lumbar fusion (1999); and Colonoscopy with propofol (N/A, 05/04/2017). Family: family history includes Cancer in his mother; Heart attack in his mother; Hyperlipidemia in his father; Hypertension in his father.  Constitutional Exam  General appearance: Well nourished, well developed, and well hydrated. In no apparent acute distress Vitals:   07/13/17 0858  BP: (!) 143/82  Pulse: 74  Resp: 18  Temp: 99.4 F (37.4 C)  SpO2: 98%  Weight: 280 lb (127 kg)  Height: 6' (1.829 m)   BMI Assessment: Estimated body mass index is 37.97 kg/m as calculated from the following:   Height as of this encounter: 6' (1.829 m).   Weight as of this encounter: 280 lb (127 kg).  BMI interpretation table: BMI level Category Range association with higher incidence of chronic pain  <18 kg/m2 Underweight   18.5-24.9 kg/m2 Ideal body weight   25-29.9 kg/m2 Overweight Increased incidence by 20%  30-34.9 kg/m2  Obese (Class I) Increased incidence by 68%  35-39.9 kg/m2 Severe obesity (Class II) Increased incidence by 136%  >40 kg/m2 Extreme obesity (Class III) Increased incidence by 254%   BMI Readings from Last 4 Encounters:  07/13/17 37.97 kg/m  05/13/17 37.97 kg/m  05/07/17 39.63 kg/m  05/04/17 37.16 kg/m   Wt Readings from Last 4 Encounters:  07/13/17 280 lb (127 kg)  05/13/17 280 lb (127 kg)  05/07/17 292 lb 3.2 oz (132.5 kg)  05/04/17 274 lb (124.3 kg)  Psych/Mental status: Alert, oriented x 3 (person, place, & time)       Eyes: PERLA Respiratory: No evidence of acute respiratory distress  Cervical Spine Area Exam  Skin & Axial Inspection: No masses, redness, edema, swelling, or associated skin lesions Alignment: Symmetrical Functional ROM: Unrestricted ROM      Stability: No instability detected Muscle Tone/Strength: Functionally intact. No  obvious neuro-muscular anomalies detected. Sensory (Neurological): Unimpaired Palpation: No palpable anomalies              Upper Extremity (UE) Exam    Side: Right upper extremity  Side: Left upper extremity  Skin & Extremity Inspection: Skin color, temperature, and hair growth are WNL. No peripheral edema or cyanosis. No masses, redness, swelling, asymmetry, or associated skin lesions. No contractures.  Skin & Extremity Inspection: Skin color, temperature, and hair growth are WNL. No peripheral edema or cyanosis. No masses, redness, swelling, asymmetry, or associated skin lesions. No contractures.  Functional ROM: Unrestricted ROM          Functional ROM: Unrestricted ROM          Muscle Tone/Strength: Functionally intact. No obvious neuro-muscular anomalies detected.  Muscle Tone/Strength: Functionally intact. No obvious neuro-muscular anomalies detected.  Sensory (Neurological): Unimpaired          Sensory (Neurological): Unimpaired          Palpation: No palpable anomalies              Palpation: No palpable anomalies              Specialized Test(s): Deferred         Specialized Test(s): Deferred          Thoracic Spine Area Exam  Skin & Axial Inspection: No masses, redness, or swelling Alignment: Symmetrical Functional ROM: Unrestricted ROM Stability: No instability detected Muscle Tone/Strength: Functionally intact. No obvious neuro-muscular anomalies detected. Sensory (Neurological): Unimpaired Muscle strength & Tone: No palpable anomalies  Lumbar Spine Area Exam  Skin & Axial Inspection: Well healed scar from previous spine surgery detected Alignment: Symmetrical Functional ROM: Decreased ROM      Stability: No instability detected Muscle Tone/Strength: Functionally intact. No obvious neuro-muscular anomalies detected. Sensory (Neurological): Unimpaired Palpation: Complains of area being tender to palpation       Provocative Tests: Lumbar Hyperextension and rotation test:  Positive bilaterally for facet joint pain. Lumbar Lateral bending test: evaluation deferred today       Patrick's Maneuver: Positive for bilateral S-I arthralgia              Gait & Posture Assessment  Ambulation: Unassisted Gait: Relatively normal for age and body habitus Posture: WNL   Lower Extremity Exam    Side: Right lower extremity  Side: Left lower extremity  Skin & Extremity Inspection: Skin color, temperature, and hair growth are WNL. No peripheral edema or cyanosis. No masses, redness, swelling, asymmetry, or associated skin lesions. No contractures.  Skin & Extremity Inspection: Skin color, temperature, and  hair growth are WNL. No peripheral edema or cyanosis. No masses, redness, swelling, asymmetry, or associated skin lesions. No contractures.  Functional ROM: Unrestricted ROM          Functional ROM: Unrestricted ROM          Muscle Tone/Strength: Functionally intact. No obvious neuro-muscular anomalies detected.  Muscle Tone/Strength: Functionally intact. No obvious neuro-muscular anomalies detected.  Sensory (Neurological): Unimpaired  Sensory (Neurological): Unimpaired  Palpation: No palpable anomalies  Palpation: No palpable anomalies   Assessment  Primary Diagnosis & Pertinent Problem List: The primary encounter diagnosis was Chronic pain syndrome. Diagnoses of Chronic right-sided low back pain without sciatica, Chronic use of opiate for therapeutic purpose, S/P lumbar fusion, Lumbar degenerative disc disease, and Spasm of back muscles were also pertinent to this visit.  Status Diagnosis  Controlled Controlled Controlled 1. Chronic pain syndrome   2. Chronic right-sided low back pain without sciatica   3. Chronic use of opiate for therapeutic purpose   4. S/P lumbar fusion   5. Lumbar degenerative disc disease   6. Spasm of back muscles     Problems updated and reviewed during this visit: Problem  S/P Lumbar Fusion  Chronic Use of Opiate for Therapeutic Purpose   Chronic Pain Syndrome   General Recommendations: The pain condition that the patient suffers from is best treated with a multidisciplinary approach that involves an increase in physical activity to prevent de-conditioning and worsening of the pain cycle, as well as psychological counseling (formal and/or informal) to address the co-morbid psychological affects of pain. Treatment will often involve judicious use of pain medications and interventional procedures to decrease the pain, allowing the patient to participate in the physical activity that will ultimately produce long-lasting pain reductions. The goal of the multidisciplinary approach is to return the patient to a higher level of overall function and to restore their ability to perform activities of daily living.  62 year old male who has been on chronic opioid therapy for greater than 10 years referred from his primary care physician for chronic pain related to his axial lumbar spine and bilateral knees. Patient's primary pain is in his lower back. Patient has an impressive lumbar surgical history which includes an L3-S1 posterior lumbar fusion with a sequelae of rod fracture that is been evaluated by orthopedic surgeon and surgery has not been recommended given how extensive reconstruction would be. Patient has tried cortisone injections, physical therapy, non-opioid analgesics including meloxicam which resulted in shortness of breath, gabapentin which resulted in vivid dreams and impaired cognition, Lyrica which resulted in leg swelling, and various muscle relaxants only of which tizanidine is somewhat effective. Patient has been stable on his chronic opioid regimen for approximately 8years which includes OxyContin 40 mg twice daily, Percocet 10 mg3 times daily as needed for breakthrough pain. I have weaned the patients shorting acting to 10 mg BID (instead of TID).   He returns for medication refill.  Doing well overall.  He states that he  is working around his house more.  No changes in his medical history.  Refill Percocet and OxyContin as below.  Consider weaning to Percocet 5 mg 3 times daily as needed at next visit.   Plan of Care  Pharmacotherapy (Medications Ordered): Meds ordered this encounter  Medications  . DISCONTD: oxyCODONE-acetaminophen (PERCOCET) 10-325 MG tablet    Sig: Take 1 tablet by mouth 2 (two) times daily as needed for pain. For chronic pain. To last for 30 days from fill date.  DNF: 07/31/17,  08/29/17    Dispense:  60 tablet    Refill:  0  . DISCONTD: oxyCODONE (OXYCONTIN) 40 mg 12 hr tablet    Sig: Take 1 tablet (40 mg total) by mouth every 12 (twelve) hours. For chronic pain. To last for 30 days from fill date.  DNF: 07/31/17, 08/29/17    Dispense:  60 tablet    Refill:  0  . oxyCODONE-acetaminophen (PERCOCET) 10-325 MG tablet    Sig: Take 1 tablet by mouth 2 (two) times daily as needed for pain. For chronic pain. To last for 30 days from fill date.  DNF: 07/31/17, 08/29/17    Dispense:  60 tablet    Refill:  0  . oxyCODONE (OXYCONTIN) 40 mg 12 hr tablet    Sig: Take 1 tablet (40 mg total) by mouth every 12 (twelve) hours. For chronic pain. To last for 30 days from fill date.  DNF: 07/31/17, 08/29/17    Dispense:  60 tablet    Refill:  0     Provider-requested follow-up: Return in about 2 months (around 09/14/2017) for Medication Management. Time Note: Greater than 50% of the 25 minute(s) of face-to-face time spent with Mr. Granquist, was spent in counseling/coordination of care regarding: Mr. Guedes primary cause of pain, medication side effects, the opioid analgesic risks and possible complications, the appropriate use of his medications, realistic expectations, the goals of pain management (increased in functionality), the medication agreement and the patient's responsibilities when it comes to controlled substances. Future Appointments  Date Time Provider [place]  08/13/2017  8:00  AM Steele Sizer, MD [facility]    Primary Care Physician: Roselee Nova, MD Location: [facility] Note by: Gillis Santa, M.D Date: 07/13/2017; Time: 9:17 AM  There are no Patient Instructions on file for this visit.

## 2017-07-13 NOTE — Progress Notes (Signed)
Nursing Pain Medication Assessment:  Safety precautions to be maintained throughout the outpatient stay will include: orient to surroundings, keep bed in low position, maintain call bell within reach at all times, provide assistance with transfer out of bed and ambulation.  Medication Inspection Compliance: Pill count conducted under aseptic conditions, in front of the patient. Neither the pills nor the bottle was removed from the patient's sight at any time. Once count was completed pills were immediately returned to the patient in their original bottle.  Medication #1: Oxycodone ER (OxyContin) Pill/Patch Count: 37 of 60 pills remain Pill/Patch Appearance: Markings consistent with prescribed medication Bottle Appearance: Standard pharmacy container. Clearly labeled. Filled Date: 03/ 28 / 2019 Last Medication intake:  Today  Medication #2: Oxycodone IR Pill/Patch Count: 37 of 60 pills remain Pill/Patch Appearance: Markings consistent with prescribed medication Bottle Appearance: Standard pharmacy container. Clearly labeled. Filled Date: 03 / 28 / 2019 Last Medication intake:  Today

## 2017-07-15 ENCOUNTER — Encounter: Payer: BLUE CROSS/BLUE SHIELD | Admitting: Student in an Organized Health Care Education/Training Program

## 2017-07-30 DIAGNOSIS — M19019 Primary osteoarthritis, unspecified shoulder: Secondary | ICD-10-CM | POA: Insufficient documentation

## 2017-08-06 ENCOUNTER — Telehealth: Payer: Self-pay | Admitting: Student in an Organized Health Care Education/Training Program

## 2017-08-06 NOTE — Telephone Encounter (Signed)
Patient is going to have surgery and his physician sent a letter over about what to do for after surgery pain. Also asked patient to call our office to follow up on this. Please let patient know what he needs to do

## 2017-08-06 NOTE — Telephone Encounter (Signed)
Instructed patient that I would fax a post op pain management form to Dr Thomasena Edis at [facility] orthopedics office.

## 2017-08-09 ENCOUNTER — Telehealth: Payer: Self-pay | Admitting: *Deleted

## 2017-08-09 ENCOUNTER — Ambulatory Visit: Payer: BLUE CROSS/BLUE SHIELD | Admitting: Family Medicine

## 2017-08-09 NOTE — Telephone Encounter (Signed)
Attempted to call patient to inform him that insurance denied coverage of Oxycontin ER. Advised per voicemail to go to insurance website or call and find out what med is on the formulary and let us know.

## 2017-08-13 ENCOUNTER — Ambulatory Visit (INDEPENDENT_AMBULATORY_CARE_PROVIDER_SITE_OTHER): Payer: BLUE CROSS/BLUE SHIELD | Admitting: Family Medicine

## 2017-08-13 ENCOUNTER — Encounter: Payer: Self-pay | Admitting: Family Medicine

## 2017-08-13 VITALS — BP 130/80 | HR 75 | Resp 16 | Ht 72.0 in | Wt 286.0 lb

## 2017-08-13 DIAGNOSIS — F1120 Opioid dependence, uncomplicated: Secondary | ICD-10-CM

## 2017-08-13 DIAGNOSIS — Z23 Encounter for immunization: Secondary | ICD-10-CM

## 2017-08-13 DIAGNOSIS — K5903 Drug induced constipation: Secondary | ICD-10-CM

## 2017-08-13 DIAGNOSIS — E1169 Type 2 diabetes mellitus with other specified complication: Secondary | ICD-10-CM

## 2017-08-13 DIAGNOSIS — R351 Nocturia: Secondary | ICD-10-CM | POA: Diagnosis not present

## 2017-08-13 DIAGNOSIS — N401 Enlarged prostate with lower urinary tract symptoms: Secondary | ICD-10-CM | POA: Diagnosis not present

## 2017-08-13 DIAGNOSIS — T402X5A Adverse effect of other opioids, initial encounter: Secondary | ICD-10-CM | POA: Diagnosis not present

## 2017-08-13 DIAGNOSIS — E785 Hyperlipidemia, unspecified: Secondary | ICD-10-CM | POA: Diagnosis not present

## 2017-08-13 DIAGNOSIS — G894 Chronic pain syndrome: Secondary | ICD-10-CM

## 2017-08-13 DIAGNOSIS — I10 Essential (primary) hypertension: Secondary | ICD-10-CM | POA: Diagnosis not present

## 2017-08-13 LAB — CBC WITH DIFFERENTIAL/PLATELET
BASOS ABS: 41 {cells}/uL (ref 0–200)
Basophils Relative: 0.6 %
EOS PCT: 3.9 %
Eosinophils Absolute: 265 cells/uL (ref 15–500)
HEMATOCRIT: 40 % (ref 38.5–50.0)
Hemoglobin: 13.9 g/dL (ref 13.2–17.1)
Lymphs Abs: 2305 cells/uL (ref 850–3900)
MCH: 30 pg (ref 27.0–33.0)
MCHC: 34.8 g/dL (ref 32.0–36.0)
MCV: 86.4 fL (ref 80.0–100.0)
MPV: 9.6 fL (ref 7.5–12.5)
Monocytes Relative: 8.9 %
Neutro Abs: 3584 cells/uL (ref 1500–7800)
Neutrophils Relative %: 52.7 %
PLATELETS: 274 10*3/uL (ref 140–400)
RBC: 4.63 10*6/uL (ref 4.20–5.80)
RDW: 12.5 % (ref 11.0–15.0)
TOTAL LYMPHOCYTE: 33.9 %
WBC mixed population: 605 cells/uL (ref 200–950)
WBC: 6.8 10*3/uL (ref 3.8–10.8)

## 2017-08-13 LAB — COMPLETE METABOLIC PANEL WITH GFR
AG Ratio: 1.6 (calc) (ref 1.0–2.5)
ALBUMIN MSPROF: 4.5 g/dL (ref 3.6–5.1)
ALKALINE PHOSPHATASE (APISO): 76 U/L (ref 40–115)
ALT: 26 U/L (ref 9–46)
AST: 23 U/L (ref 10–35)
BUN: 13 mg/dL (ref 7–25)
CHLORIDE: 102 mmol/L (ref 98–110)
CO2: 28 mmol/L (ref 20–32)
Calcium: 9.7 mg/dL (ref 8.6–10.3)
Creat: 0.86 mg/dL (ref 0.70–1.25)
GFR, Est African American: 108 mL/min/{1.73_m2} (ref >=60)
GFR, Est Non African American: 94 mL/min/{1.73_m2} (ref >=60)
GLUCOSE: 110 mg/dL — AB (ref 65–99)
Globulin: 2.8 g/dL (calc) (ref 1.9–3.7)
POTASSIUM: 4.6 mmol/L (ref 3.5–5.3)
Sodium: 138 mmol/L (ref 135–146)
Total Bilirubin: 0.4 mg/dL (ref 0.2–1.2)
Total Protein: 7.3 g/dL (ref 6.1–8.1)

## 2017-08-13 LAB — POCT GLYCOSYLATED HEMOGLOBIN (HGB A1C): HEMOGLOBIN A1C: 6.9

## 2017-08-13 LAB — LIPID PANEL
CHOL/HDL RATIO: 3.4 (calc) (ref <5.0)
Cholesterol: 149 mg/dL (ref <200)
HDL: 44 mg/dL (ref >40)
LDL CHOLESTEROL (CALC): 81 mg/dL
NON-HDL CHOLESTEROL (CALC): 105 mg/dL (ref <130)
Triglycerides: 143 mg/dL (ref <150)

## 2017-08-13 LAB — MICROALBUMIN / CREATININE URINE RATIO
CREATININE, URINE: 199 mg/dL (ref 20–320)
MICROALB UR: 1.2 mg/dL
MICROALB/CREAT RATIO: 6 ug/mg{creat} (ref <30)

## 2017-08-13 MED ORDER — METFORMIN HCL ER 750 MG PO TB24: 1500.0000 mg | ORAL_TABLET | Freq: Every day | ORAL | 1 refills | Status: DC

## 2017-08-13 MED ORDER — TAMSULOSIN HCL 0.4 MG PO CAPS: 0.4000 mg | ORAL_CAPSULE | Freq: Every day | ORAL | 1 refills | Status: DC

## 2017-08-13 MED ORDER — ZOSTER VAC RECOMB ADJUVANTED 50 MCG/0.5ML IM SUSR: 0.5000 mL | Freq: Once | INTRAMUSCULAR | 1 refills | Status: AC

## 2017-08-13 MED ORDER — ASPIRIN EC 81 MG PO TBEC: 81.0000 mg | DELAYED_RELEASE_TABLET | Freq: Every day | ORAL | 0 refills | Status: AC

## 2017-08-13 MED ORDER — LUBIPROSTONE 24 MCG PO CAPS: 24.0000 ug | ORAL_CAPSULE | Freq: Two times a day (BID) | ORAL | 1 refills | Status: DC

## 2017-08-13 MED ORDER — OMEGA-3-ACID ETHYL ESTERS 1 G PO CAPS: 2.0000 g | ORAL_CAPSULE | Freq: Two times a day (BID) | ORAL | 1 refills | Status: DC

## 2017-08-13 MED ORDER — LISINOPRIL 40 MG PO TABS: 40.0000 mg | ORAL_TABLET | Freq: Every day | ORAL | 1 refills | Status: DC

## 2017-08-13 MED ORDER — ATORVASTATIN CALCIUM 20 MG PO TABS: 20.0000 mg | ORAL_TABLET | Freq: Every day | ORAL | 1 refills | Status: DC

## 2017-08-13 NOTE — Progress Notes (Signed)
Name: Lawrence Thornton.   MRN: [id number]    DOB: Jan 22, 1956   Date:08/13/2017       Progress Note  Subjective  Chief Complaint  Chief Complaint  Patient presents with  . Hypertension  . Hyperglycemia  . Hyperlipidemia  . Back Pain    HPI  DMII: new onset, he states he likes eating ice cream, last hgbA1C was 6 back in 01/2017. Today it is 6.9% He denies polydipsia ( but states likes water), no polyphagia, has nocturia from BPH. Discussed yearly eye exam, urine micro. Denies blurred vision. He has high triglycerides and is on gemfibrozil, but we will switch to statin and lovaza. Discussed possible side effects of medications willing to take metformin, but does not want any injectables at this time. He wants to hold off on referral to dietician at this time  Chronic pain and opioid use: he was given Linzess but too expensive, we will try to switch to Amitiza because of cost. He states bowel movements changes and can be 4 at times  Morbid obesity: he states he has been eating a lot of ice cream, but will change his diet.   BPH: taking cardura but states not helping, we will change to flomax today.   Patient Active Problem List   Diagnosis Date Noted  . Dyslipidemia associated with type 2 diabetes mellitus (HCC) 08/13/2017  . Osteoarthritis of acromioclavicular joint 07/30/2017  . S/P lumbar fusion 07/13/2017  . Chronic pain syndrome 07/13/2017  . Personal history of colonic polyps   . Constipation due to opioid therapy 07/18/2015  . Hypertriglyceridemia 01/22/2015  . Extreme obesity 01/22/2015  . S/P knee replacement 12/21/2014  . Chronic low back pain 09/21/2014  . BPH associated with nocturia 09/21/2014  . Dyslipidemia 09/21/2014  . Continuous opioid dependence (HCC) 09/21/2014  . DDD (degenerative disc disease), lumbar 09/21/2014  . Failure of erection 09/21/2014    Past Surgical History:  Procedure Laterality Date  . APPENDECTOMY  age 73  . BACK SURGERY  lower  back surgery x 3    L 3 L 4 and L 5 are fused together  . COLONOSCOPY WITH PROPOFOL N/A 05/04/2017   Procedure: COLONOSCOPY WITH PROPOFOL;  Surgeon: Midge Minium, MD;  Location: [facility] ENDOSCOPY;  Service: Endoscopy;  Laterality: N/A;  . JOINT REPLACEMENT    . LUMBAR FUSION  1999   L- to L5  . metal removed from eye  years ago  . sebaceous cyst removed Right 15 years ago   middle  finger   . TOE FUSION Right    4th toe  . 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 TOTAL KNEE ARTHROPLASTY Right 12/21/2014   Procedure: RIGHT TOTAL KNEE ARTHROPLASTY;  Surgeon: Eugenia Mcalpine, MD;  Location: [facility] ORS;  Service: Orthopedics;  Laterality: Right;  . TOTAL KNEE ARTHROPLASTY Right 12/21/14    Family History  Problem Relation Age of Onset  . Cancer Mother        Throat  . Heart attack Mother   . Hypertension Father   . Hyperlipidemia Father     Social History   Socioeconomic History  . Marital status: Married    Spouse name: Not on file  . Number of children: Not on file  . Years of education: Not on file  . Highest education level: Not on file  Occupational History  . Not  on file  Social Needs  . Financial resource strain: Not on file  . Food insecurity:    Worry: Not on file    Inability: Not on file  . Transportation needs:    Medical: Not on file    Non-medical: Not on file  Tobacco Use  . Smoking status: Former Smoker    Last attempt to quit: 04/07/1999    Years since quitting: 18.3  . Smokeless tobacco: Never Used  Substance and Sexual Activity  . Alcohol use: No  . Drug use: No  . Sexual activity: Yes    Birth control/protection: Condom  Lifestyle  . Physical activity:    Days per week: Not on file    Minutes per session: Not on file  . Stress: Not on file  Relationships  . Social connections:    Talks on phone: Not on file    Gets  together: Not on file    Attends religious service: Not on file    Active member of club or organization: Not on file    Attends meetings of clubs or organizations: Not on file    Relationship status: Not on file  . Intimate partner violence:    Fear of current or ex partner: Not on file    Emotionally abused: Not on file    Physically abused: Not on file    Forced sexual activity: Not on file  Other Topics Concern  . Not on file  Social History Narrative  . Not on file     Current Outpatient Medications:  .  lisinopril (PRINIVIL,ZESTRIL) 40 MG tablet, Take 1 tablet (40 mg total) by mouth at bedtime., Disp: 90 tablet, Rfl: 1 .  oxyCODONE (OXYCONTIN) 40 mg 12 hr tablet, Take 1 tablet (40 mg total) by mouth every 12 (twelve) hours. For chronic pain. To last for 30 days from fill date.  DNF: 07/31/17, 08/29/17, Disp: 60 tablet, Rfl: 0 .  oxyCODONE-acetaminophen (PERCOCET) 10-325 MG tablet, Take 1 tablet by mouth 2 (two) times daily as needed for pain. For chronic pain. To last for 30 days from fill date.  DNF: 07/31/17, 08/29/17, Disp: 60 tablet, Rfl: 0 .  tiZANidine (ZANAFLEX) 4 MG tablet, Take 1 tablet (4 mg total) by mouth at bedtime., Disp: 30 tablet, Rfl: 2 .  aspirin EC 81 MG tablet, Take 1 tablet (81 mg total) by mouth daily., Disp: 30 tablet, Rfl: 0 .  atorvastatin (LIPITOR) 20 MG tablet, Take 1 tablet (20 mg total) by mouth daily., Disp: 90 tablet, Rfl: 1 .  lubiprostone (AMITIZA) 24 MCG capsule, Take 1 capsule (24 mcg total) by mouth 2 (two) times daily with a meal., Disp: 180 capsule, Rfl: 1 .  metFORMIN (GLUCOPHAGE-XR) 750 MG 24 hr tablet, Take 2 tablets (1,500 mg total) by mouth daily with breakfast., Disp: 180 tablet, Rfl: 1 .  omega-3 acid ethyl esters (LOVAZA) 1 g capsule, Take 2 capsules (2 g total) by mouth 2 (two) times daily., Disp: 360 capsule, Rfl: 1 .  tamsulosin (FLOMAX) 0.4 MG CAPS capsule, Take 1 capsule (0.4 mg total) by mouth daily., Disp: 90 capsule, Rfl: 1 .  Zoster  Vaccine Adjuvanted [facility]) injection, Inject 0.5 mLs into the muscle once for 1 dose., Disp: 0.5 mL, Rfl: 1  No Known Allergies   ROS  Constitutional: Negative for fever, positive form mild  weight change.  Respiratory: Negative for cough and shortness of breath.   Cardiovascular: Negative for chest pain or palpitations.  Gastrointestinal: Negative for abdominal  pain, no bowel changes.  Musculoskeletal: Negative for gait problem or joint swelling.  Skin: Negative for rash.  Neurological: Negative for dizziness or headache.  No other specific complaints in a complete review of systems (except as listed in HPI above).   Objective  Vitals:   08/13/17 0808  BP: 130/80  Pulse: 75  Resp: 16  SpO2: 98%  Weight: 286 lb (129.7 kg)  Height: 6' (1.829 m)    Body mass index is 38.79 kg/m.  Physical Exam  Constitutional: Patient appears well-developed and well-nourished. Obese No distress.  HEENT: head atraumatic, normocephalic, pupils equal and reactive to light,  neck supple, throat within normal limits Cardiovascular: Normal rate, regular rhythm and normal heart sounds.  No murmur heard. No BLE edema. Pulmonary/Chest: Effort normal and breath sounds normal. No respiratory distress. Abdominal: Soft.  There is no tenderness. Psychiatric: Patient has a normal mood and affect. behavior is normal. Judgment and thought content normal.  Recent Results (from the past 2160 hour(s))  POCT HgB A1C     Status: Abnormal   Collection Time: 08/13/17  8:11 AM  Result Value Ref Range   Hemoglobin A1C 6.9     Diabetic Foot Exam: Diabetic Foot Exam - Simple   Simple Foot Form Diabetic Foot exam was performed with the following findings:  Yes 08/13/2017  8:56 AM  Visual Inspection No deformities, no ulcerations, no other skin breakdown bilaterally:  Yes Sensation Testing Intact to touch and monofilament testing bilaterally:  Yes Pulse Check Posterior Tibialis and Dorsalis pulse intact  bilaterally:  Yes Comments      PHQ2/9: Depression screen [facility] 2/9 07/13/2017 05/13/2017 05/07/2017 03/25/2017 03/11/2017  Decreased Interest 0 0 0 0 0  Down, Depressed, Hopeless 0 0 0 0 0  PHQ - 2 Score 0 0 0 0 0     Fall Risk: Fall Risk  08/13/2017 07/13/2017 05/13/2017 05/07/2017 03/25/2017  Falls in the past year? No No No No No     Functional Status Survey: Is the patient deaf or have difficulty hearing?: No Does the patient have difficulty seeing, even when wearing glasses/contacts?: No Does the patient have difficulty concentrating, remembering, or making decisions?: No Does the patient have difficulty walking or climbing stairs?: No Does the patient have difficulty dressing or bathing?: No Does the patient have difficulty doing errands alone such as visiting a doctor's office or shopping?: No    Assessment & Plan  1. Dyslipidemia associated with type 2 diabetes mellitus (HCC)  - POCT HgB A1C - Lipid panel - Urine Microalbumin w/creat. ratio - aspirin EC 81 MG tablet; Take 1 tablet (81 mg total) by mouth daily.  Dispense: 30 tablet; Refill: 0 - atorvastatin (LIPITOR) 20 MG tablet; Take 1 tablet (20 mg total) by mouth daily.  Dispense: 90 tablet; Refill: 1 - omega-3 acid ethyl esters (LOVAZA) 1 g capsule; Take 2 capsules (2 g total) by mouth 2 (two) times daily.  Dispense: 360 capsule; Refill: 1 - metFORMIN (GLUCOPHAGE-XR) 750 MG 24 hr tablet; Take 2 tablets (1,500 mg total) by mouth daily with breakfast.  Dispense: 180 tablet; Refill: 1  2. Need for vaccination for pneumococcus  - Pneumococcal polysaccharide vaccine 23-valent greater than or equal to 2yo subcutaneous/IM  3. Need for shingles vaccine  - Zoster Vaccine Adjuvanted [facility]) injection; Inject 0.5 mLs into the muscle once for 1 dose.  Dispense: 0.5 mL; Refill: 1  4. Essential hypertension  Change from 30 mg to 40 mg - COMPLETE METABOLIC PANEL WITH GFR -  CBC with Differential/Platelet - lisinopril  (PRINIVIL,ZESTRIL) 40 MG tablet; Take 1 tablet (40 mg total) by mouth at bedtime.  Dispense: 90 tablet; Refill: 1  5. BPH associated with nocturia  - tamsulosin (FLOMAX) 0.4 MG CAPS capsule; Take 1 capsule (0.4 mg total) by mouth daily.  Dispense: 90 capsule; Refill: 1  6. Constipation due to opioid therapy  - lubiprostone (AMITIZA) 24 MCG capsule; Take 1 capsule (24 mcg total) by mouth 2 (two) times daily with a meal.  Dispense: 180 capsule; Refill: 1  7. Morbid obesity (HCC)  Discussed with the patient the risk posed by an increased BMI. Discussed importance of portion control, calorie counting and at least 150 minutes of physical activity weekly. Avoid sweet beverages and drink more water. Eat at least 6 servings of fruit and vegetables daily   8. Chronic pain syndrome  Sees Dr. Cherylann Ratel   9. Continuous opioid dependence (HCC)  Seeing pain clinic

## 2017-08-13 NOTE — Patient Instructions (Signed)
Diabetes Mellitus and Nutrition When you have diabetes (diabetes mellitus), it is very important to have healthy eating habits because your blood sugar (glucose) levels are greatly affected by what you eat and drink. Eating healthy foods in the appropriate amounts, at about the same times every day, can help you:  Control your blood glucose.  Lower your risk of heart disease.  Improve your blood pressure.  Reach or maintain a healthy weight.  Every person with diabetes is different, and each person has different needs for a meal plan. Your health care provider may recommend that you work with a diet and nutrition specialist (dietitian) to make a meal plan that is best for you. Your meal plan may vary depending on factors such as:  The calories you need.  The medicines you take.  Your weight.  Your blood glucose, blood pressure, and cholesterol levels.  Your activity level.  Other health conditions you have, such as heart or kidney disease.  How do carbohydrates affect me? Carbohydrates affect your blood glucose level more than any other type of food. Eating carbohydrates naturally increases the amount of glucose in your blood. Carbohydrate counting is a method for keeping track of how many carbohydrates you eat. Counting carbohydrates is important to keep your blood glucose at a healthy level, especially if you use insulin or take certain oral diabetes medicines. It is important to know how many carbohydrates you can safely have in each meal. This is different for every person. Your dietitian can help you calculate how many carbohydrates you should have at each meal and for snack. Foods that contain carbohydrates include:  Bread, cereal, rice, pasta, and crackers.  Potatoes and corn.  Peas, beans, and lentils.  Milk and yogurt.  Fruit and juice.  Desserts, such as cakes, cookies, ice cream, and candy.  How does alcohol affect me? Alcohol can cause a sudden decrease in blood  glucose (hypoglycemia), especially if you use insulin or take certain oral diabetes medicines. Hypoglycemia can be a life-threatening condition. Symptoms of hypoglycemia (sleepiness, dizziness, and confusion) are similar to symptoms of having too much alcohol. If your health care provider says that alcohol is safe for you, follow these guidelines:  Limit alcohol intake to no more than 1 drink per day for nonpregnant women and 2 drinks per day for men. One drink equals 12 oz of beer, 5 oz of wine, or 1 oz of hard liquor.  Do not drink on an empty stomach.  Keep yourself hydrated with water, diet soda, or unsweetened iced tea.  Keep in mind that regular soda, juice, and other mixers may contain a lot of sugar and must be counted as carbohydrates.  What are tips for following this plan? Reading food labels  Start by checking the serving size on the label. The amount of calories, carbohydrates, fats, and other nutrients listed on the label are based on one serving of the food. Many foods contain more than one serving per package.  Check the total grams (g) of carbohydrates in one serving. You can calculate the number of servings of carbohydrates in one serving by dividing the total carbohydrates by 15. For example, if a food has 30 g of total carbohydrates, it would be equal to 2 servings of carbohydrates.  Check the number of grams (g) of saturated and trans fats in one serving. Choose foods that have low or no amount of these fats.  Check the number of milligrams (mg) of sodium in one serving. Most people   should limit total sodium intake to less than 2,300 mg per day.  Always check the nutrition information of foods labeled as "low-fat" or "nonfat". These foods may be higher in added sugar or refined carbohydrates and should be avoided.  Talk to your dietitian to identify your daily goals for nutrients listed on the label. Shopping  Avoid buying canned, premade, or processed foods. These  foods tend to be high in fat, sodium, and added sugar.  Shop around the outside edge of the grocery store. This includes fresh fruits and vegetables, bulk grains, fresh meats, and fresh dairy. Cooking  Use low-heat cooking methods, such as baking, instead of high-heat cooking methods like deep frying.  Cook using healthy oils, such as olive, canola, or sunflower oil.  Avoid cooking with butter, cream, or high-fat meats. Meal planning  Eat meals and snacks regularly, preferably at the same times every day. Avoid going long periods of time without eating.  Eat foods high in fiber, such as fresh fruits, vegetables, beans, and whole grains. Talk to your dietitian about how many servings of carbohydrates you can eat at each meal.  Eat 4-6 ounces of lean protein each day, such as lean meat, chicken, fish, eggs, or tofu. 1 ounce is equal to 1 ounce of meat, chicken, or fish, 1 egg, or 1/4 cup of tofu.  Eat some foods each day that contain healthy fats, such as avocado, nuts, seeds, and fish. Lifestyle   Check your blood glucose regularly.  Exercise at least 30 minutes 5 or more days each week, or as told by your health care provider.  Take medicines as told by your health care provider.  Do not use any products that contain nicotine or tobacco, such as cigarettes and e-cigarettes. If you need help quitting, ask your health care provider.  Work with a counselor or diabetes educator to identify strategies to manage stress and any emotional and social challenges. What are some questions to ask my health care provider?  Do I need to meet with a diabetes educator?  Do I need to meet with a dietitian?  What number can I call if I have questions?  When are the best times to check my blood glucose? Where to find more information:  American Diabetes Association: diabetes.org/food-and-fitness/food  Academy of Nutrition and Dietetics:  www.eatright.org/resources/health/diseases-and-conditions/diabetes  National Institute of Diabetes and Digestive and Kidney Diseases (NIH): www.niddk.nih.gov/health-information/diabetes/overview/diet-eating-physical-activity Summary  A healthy meal plan will help you control your blood glucose and maintain a healthy lifestyle.  Working with a diet and nutrition specialist (dietitian) can help you make a meal plan that is best for you.  Keep in mind that carbohydrates and alcohol have immediate effects on your blood glucose levels. It is important to count carbohydrates and to use alcohol carefully. This information is not intended to replace advice given to you by your health care provider. Make sure you discuss any questions you have with your health care provider. Document Released: 12/18/2004 Document Revised: 04/27/2016 Document Reviewed: 04/27/2016 Elsevier Interactive Patient Education  2018 Elsevier Inc.  

## 2017-08-19 ENCOUNTER — Telehealth: Payer: Self-pay

## 2017-08-19 NOTE — Telephone Encounter (Signed)
Records faxed.

## 2017-08-19 NOTE — Telephone Encounter (Signed)
Copied from CRM [id number]. Topic: General - Other >> Aug 19, 2017 11:47 AM Gerrianne Scale wrote: Reason for CRM: Olegario Messier from [facility] [phone] ext [phone] calling for pt medical records of last OV labs EKG and last physical  they sent a fax on 08-18-17 pt surgery is 08-24-17

## 2017-08-31 HISTORY — PX: OTHER SURGICAL HISTORY: SHX169

## 2017-09-14 ENCOUNTER — Encounter: Payer: Self-pay | Admitting: Student in an Organized Health Care Education/Training Program

## 2017-09-14 ENCOUNTER — Other Ambulatory Visit: Payer: Self-pay | Admitting: Student in an Organized Health Care Education/Training Program

## 2017-09-14 ENCOUNTER — Ambulatory Visit
Payer: BLUE CROSS/BLUE SHIELD | Attending: Student in an Organized Health Care Education/Training Program | Admitting: Student in an Organized Health Care Education/Training Program

## 2017-09-14 ENCOUNTER — Other Ambulatory Visit: Payer: Self-pay

## 2017-09-14 VITALS — BP 143/71 | HR 79 | Temp 98.6°F | Resp 18 | Ht 72.0 in | Wt 276.0 lb

## 2017-09-14 DIAGNOSIS — Z8249 Family history of ischemic heart disease and other diseases of the circulatory system: Secondary | ICD-10-CM | POA: Diagnosis not present

## 2017-09-14 DIAGNOSIS — I10 Essential (primary) hypertension: Secondary | ICD-10-CM | POA: Insufficient documentation

## 2017-09-14 DIAGNOSIS — G894 Chronic pain syndrome: Secondary | ICD-10-CM | POA: Diagnosis not present

## 2017-09-14 DIAGNOSIS — G8929 Other chronic pain: Secondary | ICD-10-CM | POA: Diagnosis not present

## 2017-09-14 DIAGNOSIS — M51369 Other intervertebral disc degeneration, lumbar region without mention of lumbar back pain or lower extremity pain: Secondary | ICD-10-CM

## 2017-09-14 DIAGNOSIS — E781 Pure hyperglyceridemia: Secondary | ICD-10-CM | POA: Diagnosis not present

## 2017-09-14 DIAGNOSIS — R351 Nocturia: Secondary | ICD-10-CM | POA: Diagnosis not present

## 2017-09-14 DIAGNOSIS — M62830 Muscle spasm of back: Secondary | ICD-10-CM

## 2017-09-14 DIAGNOSIS — Z96653 Presence of artificial knee joint, bilateral: Secondary | ICD-10-CM | POA: Diagnosis not present

## 2017-09-14 DIAGNOSIS — Z79891 Long term (current) use of opiate analgesic: Secondary | ICD-10-CM

## 2017-09-14 DIAGNOSIS — M5136 Other intervertebral disc degeneration, lumbar region: Secondary | ICD-10-CM | POA: Diagnosis not present

## 2017-09-14 DIAGNOSIS — K5900 Constipation, unspecified: Secondary | ICD-10-CM | POA: Diagnosis not present

## 2017-09-14 DIAGNOSIS — Z79899 Other long term (current) drug therapy: Secondary | ICD-10-CM | POA: Diagnosis not present

## 2017-09-14 DIAGNOSIS — Z7982 Long term (current) use of aspirin: Secondary | ICD-10-CM | POA: Insufficient documentation

## 2017-09-14 DIAGNOSIS — N401 Enlarged prostate with lower urinary tract symptoms: Secondary | ICD-10-CM | POA: Insufficient documentation

## 2017-09-14 DIAGNOSIS — M19019 Primary osteoarthritis, unspecified shoulder: Secondary | ICD-10-CM | POA: Insufficient documentation

## 2017-09-14 DIAGNOSIS — Z87891 Personal history of nicotine dependence: Secondary | ICD-10-CM | POA: Diagnosis not present

## 2017-09-14 DIAGNOSIS — M545 Low back pain, unspecified: Secondary | ICD-10-CM

## 2017-09-14 DIAGNOSIS — E119 Type 2 diabetes mellitus without complications: Secondary | ICD-10-CM | POA: Diagnosis not present

## 2017-09-14 DIAGNOSIS — Z7984 Long term (current) use of oral hypoglycemic drugs: Secondary | ICD-10-CM | POA: Diagnosis not present

## 2017-09-14 DIAGNOSIS — Z86010 Personal history of colonic polyps: Secondary | ICD-10-CM | POA: Insufficient documentation

## 2017-09-14 DIAGNOSIS — E669 Obesity, unspecified: Secondary | ICD-10-CM | POA: Diagnosis not present

## 2017-09-14 DIAGNOSIS — Z981 Arthrodesis status: Secondary | ICD-10-CM | POA: Diagnosis not present

## 2017-09-14 MED ORDER — OXYCODONE-ACETAMINOPHEN 10-325 MG PO TABS: 1.0000 | ORAL_TABLET | Freq: Two times a day (BID) | ORAL | 0 refills | Status: DC | PRN

## 2017-09-14 MED ORDER — OXYCODONE HCL ER 40 MG PO T12A: 40.0000 mg | EXTENDED_RELEASE_TABLET | Freq: Two times a day (BID) | ORAL | 0 refills | Status: DC

## 2017-09-14 NOTE — Patient Instructions (Signed)
You have been given 3 Rx for OXYCONTIN and 3 Rx for Percocet to last until 12/26/2017.

## 2017-09-14 NOTE — Progress Notes (Signed)
Safety precautions to be maintained throughout the outpatient stay will include: orient to surroundings, keep bed in low position, maintain call bell within reach at all times, provide assistance with transfer out of bed and ambulation.   Nursing Pain Medication Assessment:  Safety precautions to be maintained throughout the outpatient stay will include: orient to surroundings, keep bed in low position, maintain call bell within reach at all times, provide assistance with transfer out of bed and ambulation.  Medication Inspection Compliance: Pill count conducted under aseptic conditions, in front of the patient. Neither the pills nor the bottle was removed from the patient's sight at any time. Once count was completed pills were immediately returned to the patient in their original bottle.  Medication #1: Oxycodone/APAP Pill/Patch Count: 27 of 60 pills remain Pill/Patch Appearance: Markings consistent with prescribed medication Bottle Appearance: Standard pharmacy container. Clearly labeled. Filled Date: 05 / 26 / 2019 Last Medication intake:  Today  Medication #2: Oxycodone ER (OxyContin) Pill/Patch Count: 27 of 60 pills remain Pill/Patch Appearance: Markings consistent with prescribed medication Bottle Appearance: Standard pharmacy container. Clearly labeled. Filled Date: 05 / 26 / 2019 Last Medication intake:  Today

## 2017-09-14 NOTE — Progress Notes (Signed)
Patient's Name: Lawrence Thornton.  MRN: [id number]  Referring Provider: Roselee Nova, MD  DOB: Aug 30, 1955  PCP: Steele Sizer, MD  DOS: 09/14/2017  Note by: Gillis Santa, MD  Service setting: Ambulatory outpatient  Specialty: Interventional Pain Management  Location: [facility] (AMB) Pain Management Facility    Patient type: Established   Primary Reason(s) for Visit: Encounter for prescription drug management. (Level of risk: moderate)  CC: Medication Refill  HPI  Lawrence Thornton is a 62 y.o. year old, male patient, who comes today for a medication management evaluation. He has Chronic low back pain; BPH associated with nocturia; Dyslipidemia; Continuous opioid dependence ([place]); DDD (degenerative disc disease), lumbar; Failure of erection; S/P knee replacement; Hypertriglyceridemia; Extreme obesity; Constipation due to opioid therapy; Personal history of colonic polyps; S/P lumbar fusion; Chronic pain syndrome; Osteoarthritis of acromioclavicular joint; and Dyslipidemia associated with type 2 diabetes mellitus ([place]) on their problem list. His primarily concern today is the Medication Refill  Pain Assessment: Location: Lower, Right Back Radiating:   Onset: More than a month ago Duration: Chronic pain Quality: Sharp, Dull Severity: 4 /10 (subjective, self-reported pain score)  Note: Reported level is compatible with observation.                         When using our objective Pain Scale, levels between 6 and 10/10 are said to belong in an emergency room, as it progressively worsens from a 6/10, described as severely limiting, requiring emergency care not usually available at an outpatient pain management facility. At a 6/10 level, communication becomes difficult and requires great effort. Assistance to reach the emergency department may be required. Facial flushing and profuse sweating along with potentially dangerous increases in heart rate and blood pressure will be evident. Effect on ADL:  "Slows me down and have to watch what I do"  Timing: Intermittent Modifying factors: Medications, walking BP: (!) 143/71  HR: 79  Lawrence Thornton was last scheduled for an appointment on 08/06/2017 for medication management. During today's appointment we reviewed Lawrence Thornton's chronic pain status, as well as his outpatient medication regimen.  Patient presents today for medication management.  Since his last visit with me, patient has had right rotator cuff surgery and is recovering from that.  He is participating in physical therapy.  He has a follow-up appointment with his orthopedic surgeon in [place] on June 18.  He is recovering as expected.  Endorses difficulty with arm extension.   The patient  reports that he does not use drugs. His body mass index is 37.43 kg/m.  Further details on both, my assessment(s), as well as the proposed treatment plan, please see below.  Controlled Substance Pharmacotherapy Assessment REMS (Risk Evaluation and Mitigation Strategy)  Percocet 10 mg2times daily as needed for breakthrough, OxyContin 40 mg twice daily. MME/day:Percocet equals30, OxyContin equals 192m/daytotal equals 150 (reduced from 165)  [place]ne Napoleon [place] 09/14/2017  8:39 AM  Sign at close encounter Safety precautions to be maintained throughout the outpatient stay will include: orient to surroundings, keep bed in low position, maintain call bell within reach at all times, provide assistance with transfer out of bed and ambulation.   Nursing Pain Medication Assessment:  Safety precautions to be maintained throughout the outpatient stay will include: orient to surroundings, keep bed in low position, maintain call bell within reach at all times, provide assistance with transfer out of bed and ambulation.  Medication Inspection Compliance: Pill count conducted under  aseptic conditions, in front of the patient. Neither the pills nor the bottle was removed from the patient's sight at  any time. Once count was completed pills were immediately returned to the patient in their original bottle.  Medication #1: Oxycodone/APAP Pill/Patch Count: 27 of 60 pills remain Pill/Patch Appearance: Markings consistent with prescribed medication Bottle Appearance: Standard pharmacy container. Clearly labeled. Filled Date: 05 / 26 / 2019 Last Medication intake:  Today  Medication #2: Oxycodone ER (OxyContin) Pill/Patch Count: 27 of 60 pills remain Pill/Patch Appearance: Markings consistent with prescribed medication Bottle Appearance: Standard pharmacy container. Clearly labeled. Filled Date: 05 / 26 / 2019 Last Medication intake:  Today   Pharmacokinetics: Liberation and absorption (onset of action): WNL Distribution (time to peak effect): WNL Metabolism and excretion (duration of action): WNL         Pharmacodynamics: Desired effects: Analgesia: Lawrence Thornton reports >50% benefit. Functional ability: Patient reports that medication allows him to accomplish basic ADLs Clinically meaningful improvement in function (CMIF): Sustained CMIF goals met Perceived effectiveness: Described as relatively effective, allowing for increase in activities of daily living (ADL) Undesirable effects: Side-effects or Adverse reactions: None reported Monitoring: [place] PMP: Online review of the past 41-monthperiod conducted. Compliant with practice rules and regulations Last UDS on record: Summary  Date Value Ref Range Status  03/11/2017 FINAL  Final    Comment:    ==================================================================== TOXASSURE COMP DRUG ANALYSIS,UR ==================================================================== Test                             Result       Flag       Units Drug Present and Declared for Prescription Verification   Oxycodone                      2974         EXPECTED   ng/mg creat   Oxymorphone                    1740         EXPECTED   ng/mg creat    Noroxycodone                   2878         EXPECTED   ng/mg creat   Noroxymorphone                 420          EXPECTED   ng/mg creat    Sources of oxycodone are scheduled prescription medications.    Oxymorphone, noroxycodone, and noroxymorphone are expected    metabolites of oxycodone. Oxymorphone is also available as a    scheduled prescription medication.   Acetaminophen                  PRESENT      EXPECTED Drug Absent but Declared for Prescription Verification   Tizanidine                     Not Detected UNEXPECTED    Tizanidine, as indicated in the declared medication list, is not    always detected even when used as directed. ==================================================================== Test                      Result    Flag   Units      Ref Range  Creatinine              112              mg/dL      >=20 ==================================================================== Declared Medications:  The flagging and interpretation on this report are based on the  following declared medications.  Unexpected results may arise from  inaccuracies in the declared medications.  **Note: The testing scope of this panel includes these medications:  Oxycodone  Oxycodone (Oxycodone Acetaminophen)  **Note: The testing scope of this panel does not include small to  moderate amounts of these reported medications:  Acetaminophen (Oxycodone Acetaminophen)  Tizanidine  **Note: The testing scope of this panel does not include following  reported medications:  Doxazosin  Gemfibrozil  Linaclotide  Lisinopril ==================================================================== For clinical consultation, please call [phone]. ====================================================================    UDS interpretation: Compliant          Medication Assessment Form: Reviewed. Patient indicates being compliant with therapy Treatment compliance: Compliant Risk Assessment  Profile: Aberrant behavior: See prior evaluations. None observed or detected today Comorbid factors increasing risk of overdose: See prior notes. No additional risks detected today Risk of substance use disorder (SUD): Low Opioid Risk Tool - 09/14/17 0837      Family History of Substance Abuse   Alcohol  Negative    Illegal Drugs  Negative    Rx Drugs  Negative      Personal History of Substance Abuse   Alcohol  Negative    Illegal Drugs  Negative    Rx Drugs  Negative      Age   Age between 9-45 years   No      History of Preadolescent Sexual Abuse   History of Preadolescent Sexual Abuse  Negative or Male      Psychological Disease   Psychological Disease  Negative    Depression  Negative      Total Score   Opioid Risk Tool Scoring  0    Opioid Risk Interpretation  Low Risk      ORT Scoring interpretation table:  Score <3 = Low Risk for SUD  Score between 4-7 = Moderate Risk for SUD  Score >8 = High Risk for Opioid Abuse   Risk Mitigation Strategies:  Patient Counseling: Covered Patient-Prescriber Agreement (PPA): Present and active  Notification to other healthcare providers: Done  Pharmacologic Plan: No change in therapy, at this time.             Laboratory Chemistry  Inflammation Markers (CRP: Acute Phase) (ESR: Chronic Phase) No results found for: CRP, ESRSEDRATE, LATICACIDVEN                       Rheumatology Markers No results found for: RF, ANA, LABURIC, URICUR, LYMEIGGIGMAB, LYMEABIGMQN, HLAB27                      Renal Function Markers Lab Results  Component Value Date   BUN 13 08/13/2017   CREATININE 0.86 53/66/4403   BCR NOT APPLICABLE 47/42/5956   GFRAA 108 08/13/2017   GFRNONAA 94 08/13/2017                             Hepatic Function Markers Lab Results  Component Value Date   AST 23 08/13/2017   ALT 26 08/13/2017   ALBUMIN 4.6 11/11/2015   ALKPHOS 76 11/11/2015   HCVAB NEGATIVE 10/06/2016  Electrolytes Lab Results  Component Value Date   NA 138 08/13/2017   K 4.6 08/13/2017   CL 102 08/13/2017   CALCIUM 9.7 08/13/2017                        Neuropathy Markers Lab Results  Component Value Date   HGBA1C 6.9 08/13/2017                        Bone Pathology Markers Lab Results  Component Value Date   VD25OH 43 10/06/2016                         Coagulation Parameters Lab Results  Component Value Date   INR 1.07 12/13/2014   LABPROT 14.1 12/13/2014   APTT 34 12/13/2014   PLT 274 08/13/2017                        Cardiovascular Markers Lab Results  Component Value Date   HGB 13.9 08/13/2017   HCT 40.0 08/13/2017                         CA Markers No results found for: CEA, CA125, LABCA2                      Note: Lab results reviewed.    Meds   Current Outpatient Medications:  .  aspirin EC 81 MG tablet, Take 1 tablet (81 mg total) by mouth daily., Disp: 30 tablet, Rfl: 0 .  atorvastatin (LIPITOR) 20 MG tablet, Take 1 tablet (20 mg total) by mouth daily., Disp: 90 tablet, Rfl: 1 .  lisinopril (PRINIVIL,ZESTRIL) 40 MG tablet, Take 1 tablet (40 mg total) by mouth at bedtime., Disp: 90 tablet, Rfl: 1 .  lubiprostone (AMITIZA) 24 MCG capsule, Take 1 capsule (24 mcg total) by mouth 2 (two) times daily with a meal., Disp: 180 capsule, Rfl: 1 .  metFORMIN (GLUCOPHAGE-XR) 750 MG 24 hr tablet, Take 2 tablets (1,500 mg total) by mouth daily with breakfast., Disp: 180 tablet, Rfl: 1 .  omega-3 acid ethyl esters (LOVAZA) 1 g capsule, Take 2 capsules (2 g total) by mouth 2 (two) times daily., Disp: 360 capsule, Rfl: 1 .  oxyCODONE (OXYCONTIN) 40 mg 12 hr tablet, Take 1 tablet (40 mg total) by mouth every 12 (twelve) hours. For chronic pain. To last for 30 days from fill date.  DNF: 09/28/17, 10/27/17, 11/26/17, Disp: 60 tablet, Rfl: 0 .  oxyCODONE-acetaminophen (PERCOCET) 10-325 MG tablet, Take 1 tablet by mouth 2 (two) times daily as needed for pain. For chronic  pain. To last for 30 days from fill date.  DNF: 09/28/17, 10/27/17, 11/26/17, Disp: 60 tablet, Rfl: 0 .  tamsulosin (FLOMAX) 0.4 MG CAPS capsule, Take 1 capsule (0.4 mg total) by mouth daily., Disp: 90 capsule, Rfl: 1 .  tiZANidine (ZANAFLEX) 4 MG tablet, Take 1 tablet (4 mg total) by mouth at bedtime., Disp: 30 tablet, Rfl: 2  ROS  Constitutional: Denies any fever or chills Gastrointestinal: No reported hemesis, hematochezia, vomiting, or acute GI distress Musculoskeletal: Denies any acute onset joint swelling, redness, loss of ROM, or weakness Neurological: No reported episodes of acute onset apraxia, aphasia, dysarthria, agnosia, amnesia, paralysis, loss of coordination, or loss of consciousness  Allergies  Lawrence Thornton has No Known Allergies.  [place]  Drug: Lawrence Thornton  reports that he does not use drugs. Alcohol:  reports that he does not drink alcohol. Tobacco:  reports that he quit smoking about 18 years ago. He has never used smokeless tobacco. Medical:  has a past medical history of Arthritis and Hypertension. Surgical: Lawrence Thornton  has a past surgical history that includes Back surgery (lower back surgery x 3 ); Appendectomy (age 11); sebaceous cyst removed (Right, 15 years ago); Toe Fusion (Right); metal removed from eye (years ago); Total knee arthroplasty (Left, 08/10/2014); Joint replacement; Total knee arthroplasty (Right, 12/21/2014); Total knee arthroplasty (Right, 12/21/14); Lumbar fusion (1999); and Colonoscopy with propofol (N/A, 05/04/2017). Family: family history includes Cancer in his mother; Heart attack in his mother; Hyperlipidemia in his father; Hypertension in his father.  Constitutional Exam  General appearance: Well nourished, well developed, and well hydrated. In no apparent acute distress Vitals:   09/14/17 0829  BP: (!) 143/71  Pulse: 79  Resp: 18  Temp: 98.6 F (37 C)  SpO2: 100%  Weight: 276 lb (125.2 kg)  Height: 6' (1.829 m)   BMI Assessment: Estimated  body mass index is 37.43 kg/m as calculated from the following:   Height as of this encounter: 6' (1.829 m).   Weight as of this encounter: 276 lb (125.2 kg).  BMI interpretation table: BMI level Category Range association with higher incidence of chronic pain  <18 kg/m2 Underweight   18.5-24.9 kg/m2 Ideal body weight   25-29.9 kg/m2 Overweight Increased incidence by 20%  30-34.9 kg/m2 Obese (Class I) Increased incidence by 68%  35-39.9 kg/m2 Severe obesity (Class II) Increased incidence by 136%  >40 kg/m2 Extreme obesity (Class III) Increased incidence by 254%   Patient's current BMI Ideal Body weight  Body mass index is 37.43 kg/m. Ideal body weight: 77.6 kg (171 lb 1.2 oz) Adjusted ideal body weight: 96.6 kg (213 lb 0.7 oz)   BMI Readings from Last 4 Encounters:  09/14/17 37.43 kg/m  08/13/17 38.79 kg/m  07/13/17 37.97 kg/m  05/13/17 37.97 kg/m   Wt Readings from Last 4 Encounters:  09/14/17 276 lb (125.2 kg)  08/13/17 286 lb (129.7 kg)  07/13/17 280 lb (127 kg)  05/13/17 280 lb (127 kg)  Psych/Mental status: Alert, oriented x 3 (person, place, & time)       Eyes: PERLA Respiratory: No evidence of acute respiratory distress  Cervical Spine Area Exam  Skin & Axial Inspection: No masses, redness, edema, swelling, or associated skin lesions Alignment: Symmetrical Functional ROM: Unrestricted ROM      Stability: No instability detected Muscle Tone/Strength: Functionally intact. No obvious neuro-muscular anomalies detected. Sensory (Neurological): Unimpaired Palpation: No palpable anomalies              Upper Extremity (UE) Exam    Side: Right upper extremity  Side: Left upper extremity  Skin & Extremity Inspection: Evidence of prior arthroplastic surgery right shoulder in sling  Skin & Extremity Inspection: Skin color, temperature, and hair growth are WNL. No peripheral edema or cyanosis. No masses, redness, swelling, asymmetry, or associated skin lesions. No  contractures.  Functional ROM: Mechanically restricted ROM          Functional ROM: Unrestricted ROM          Muscle Tone/Strength: Functionally intact. No obvious neuro-muscular anomalies detected.  Muscle Tone/Strength: Functionally intact. No obvious neuro-muscular anomalies detected.  Sensory (Neurological): Musculoskeletal pain pattern          Sensory (Neurological): Unimpaired  Palpation: Complains of area being tender to palpation around shoulder              Palpation: No palpable anomalies              Provocative Test(s):  Phalen's test: deferred Tinel's test: deferred Apley's scratch test (touch opposite shoulder):  Action 1 (Across chest): deferred Action 2 (Overhead): deferred Action 3 (LB reach): deferred   Provocative Test(s):  Phalen's test: deferred Tinel's test: deferred Apley's scratch test (touch opposite shoulder):  Action 1 (Across chest): deferred Action 2 (Overhead): deferred Action 3 (LB reach): deferred    Thoracic Spine Area Exam  Skin & Axial Inspection: No masses, redness, or swelling Alignment: Symmetrical Functional ROM: Unrestricted ROM Stability: No instability detected Muscle Tone/Strength: Functionally intact. No obvious neuro-muscular anomalies detected. Sensory (Neurological): Unimpaired Muscle strength & Tone: No palpable anomalies  Lumbar Spine Area Exam  Skin & Axial Inspection: No masses, redness, or swelling Alignment: Symmetrical Functional ROM: Unrestricted ROM       Stability: No instability detected Muscle Tone/Strength: Functionally intact. No obvious neuro-muscular anomalies detected. Sensory (Neurological): Unimpaired Palpation: No palpable anomalies       Provocative Tests: Lumbar Hyperextension/rotation test: deferred today       Lumbar quadrant test (Kemp's test): deferred today       Lumbar Lateral bending test: deferred today       Patrick's Maneuver: deferred today                   FABER test: deferred today        Thigh-thrust test: deferred today       S-I compression test: deferred today       S-I distraction test: deferred today        Gait & Posture Assessment  Ambulation: Unassisted Gait: Relatively normal for age and body habitus Posture: WNL   Lower Extremity Exam    Side: Right lower extremity  Side: Left lower extremity  Stability: No instability observed          Stability: No instability observed          Skin & Extremity Inspection: Skin color, temperature, and hair growth are WNL. No peripheral edema or cyanosis. No masses, redness, swelling, asymmetry, or associated skin lesions. No contractures.  Skin & Extremity Inspection: Skin color, temperature, and hair growth are WNL. No peripheral edema or cyanosis. No masses, redness, swelling, asymmetry, or associated skin lesions. No contractures.  Functional ROM: Unrestricted ROM                  Functional ROM: Unrestricted ROM                  Muscle Tone/Strength: Functionally intact. No obvious neuro-muscular anomalies detected.  Muscle Tone/Strength: Functionally intact. No obvious neuro-muscular anomalies detected.  Sensory (Neurological): Unimpaired  Sensory (Neurological): Unimpaired  Palpation: No palpable anomalies  Palpation: No palpable anomalies   Assessment  Primary Diagnosis & Pertinent Problem List: The primary encounter diagnosis was Chronic pain syndrome. Diagnoses of Chronic right-sided low back pain without sciatica, Chronic use of opiate for therapeutic purpose, S/P lumbar fusion, Lumbar degenerative disc disease, and Spasm of back muscles were also pertinent to this visit.  Status Diagnosis  Controlled Controlled Controlled 1. Chronic pain syndrome   2. Chronic right-sided low back pain without sciatica   3. Chronic use of opiate for therapeutic purpose   4. S/P lumbar fusion   5. Lumbar degenerative disc disease  6. Spasm of back muscles     General Recommendations: The pain condition that the patient  suffers from is best treated with a multidisciplinary approach that involves an increase in physical activity to prevent de-conditioning and worsening of the pain cycle, as well as psychological counseling (formal and/or informal) to address the co-morbid psychological affects of pain. Treatment will often involve judicious use of pain medications and interventional procedures to decrease the pain, allowing the patient to participate in the physical activity that will ultimately produce long-lasting pain reductions. The goal of the multidisciplinary approach is to return the patient to a higher level of overall function and to restore their ability to perform activities of daily living.  62 year old male who has been on chronic opioid therapy for greater than 10 years referred from his primary care physician for chronic pain related to his axial lumbar spine and bilateral knees. Patient's primary pain is in his lower back. Patient has an impressive lumbar surgical history which includes an L3-S1 posterior lumbar fusion with a sequelae of rod fracture that is been evaluated by orthopedic surgeon and surgery has not been recommended given how extensive reconstruction would be. Patient has tried cortisone injections, physical therapy, non-opioid analgesics including meloxicam which resulted in shortness of breath, gabapentin which resulted in vivid dreams and impaired cognition, Lyrica which resulted in leg swelling, and various muscle relaxants only of which tizanidine is somewhat effective. Patient has been stable on his chronic opioid regimen for approximately 8years which includes OxyContin 40 mg twice daily, Percocet 10 mg3 times daily as needed for breakthrough pain. I have weaned the patients shorting acting to 10 mg BID (instead of TID).  Patient presents today for medication management.  Since his last visit with me, patient has had right rotator cuff surgery and is recovering from that.  He is  participating in physical therapy.  He has a follow-up appointment with his orthopedic surgeon in [place] on June 18.  He is recovering as expected.  Endorses difficulty with arm extension.  Patient obtained oxycodone 5 mg, quantity 40 after his surgery for his acute postoperative pain.  He states that his acute on chronic postop pain is well managed on his current regimen.  We will refill his chronic pain regimen as below for 3 months.  [place] PMP checked and appropriate.  Plan of Care  Pharmacotherapy (Medications Ordered): Meds ordered this encounter  Medications  . DISCONTD: oxyCODONE-acetaminophen (PERCOCET) 10-325 MG tablet    Sig: Take 1 tablet by mouth 2 (two) times daily as needed for pain. For chronic pain. To last for 30 days from fill date.  DNF: 09/28/17, 10/27/17, 11/26/17    Dispense:  60 tablet    Refill:  0  . DISCONTD: oxyCODONE (OXYCONTIN) 40 mg 12 hr tablet    Sig: Take 1 tablet (40 mg total) by mouth every 12 (twelve) hours. For chronic pain. To last for 30 days from fill date.  DNF: 09/28/17, 10/27/17, 11/26/17    Dispense:  60 tablet    Refill:  0  . DISCONTD: oxyCODONE (OXYCONTIN) 40 mg 12 hr tablet    Sig: Take 1 tablet (40 mg total) by mouth every 12 (twelve) hours. For chronic pain. To last for 30 days from fill date.  DNF: 09/28/17, 10/27/17, 11/26/17    Dispense:  60 tablet    Refill:  0  . DISCONTD: oxyCODONE-acetaminophen (PERCOCET) 10-325 MG tablet    Sig: Take 1 tablet by mouth 2 (two) times daily as  needed for pain. For chronic pain. To last for 30 days from fill date.  DNF: 09/28/17, 10/27/17, 11/26/17    Dispense:  60 tablet    Refill:  0  . oxyCODONE (OXYCONTIN) 40 mg 12 hr tablet    Sig: Take 1 tablet (40 mg total) by mouth every 12 (twelve) hours. For chronic pain. To last for 30 days from fill date.  DNF: 09/28/17, 10/27/17, 11/26/17    Dispense:  60 tablet    Refill:  0  . oxyCODONE-acetaminophen (PERCOCET) 10-325 MG tablet    Sig: Take 1  tablet by mouth 2 (two) times daily as needed for pain. For chronic pain. To last for 30 days from fill date.  DNF: 09/28/17, 10/27/17, 11/26/17    Dispense:  60 tablet    Refill:  0    Provider-requested follow-up: Return in about 3 months (around 12/16/2017) for MM with Crystal.  Time Note: Greater than 50% of the 25 minute(s) of face-to-face time spent with Lawrence Thornton, was spent in counseling/coordination of care regarding: Lawrence Thornton primary cause of pain, the treatment plan, treatment alternatives, medication side effects, the opioid analgesic risks and possible complications, the appropriate use of his medications, realistic expectations, the goals of pain management (increased in functionality), the medication agreement and the patient's responsibilities when it comes to controlled substances.  Future Appointments  Date Time Provider [place]  11/15/2017  8:20 AM Steele Sizer, MD [place]    Primary Care Physician: Steele Sizer, MD Location: [facility] Note by: Gillis Santa, M.D Date: 09/14/2017; Time: 8:50 AM  There are no Patient Instructions on file for this visit.

## 2017-09-22 ENCOUNTER — Other Ambulatory Visit: Payer: Self-pay | Admitting: Student in an Organized Health Care Education/Training Program

## 2017-09-22 DIAGNOSIS — M62830 Muscle spasm of back: Secondary | ICD-10-CM

## 2017-10-19 ENCOUNTER — Encounter: Payer: Self-pay | Admitting: Family Medicine

## 2017-11-15 ENCOUNTER — Ambulatory Visit (INDEPENDENT_AMBULATORY_CARE_PROVIDER_SITE_OTHER): Payer: BLUE CROSS/BLUE SHIELD | Admitting: Family Medicine

## 2017-11-15 ENCOUNTER — Encounter: Payer: Self-pay | Admitting: Family Medicine

## 2017-11-15 VITALS — BP 138/72 | HR 97 | Temp 98.0°F | Resp 18 | Ht 72.0 in | Wt 271.8 lb

## 2017-11-15 DIAGNOSIS — E785 Hyperlipidemia, unspecified: Secondary | ICD-10-CM | POA: Diagnosis not present

## 2017-11-15 DIAGNOSIS — E1169 Type 2 diabetes mellitus with other specified complication: Secondary | ICD-10-CM | POA: Diagnosis not present

## 2017-11-15 DIAGNOSIS — N401 Enlarged prostate with lower urinary tract symptoms: Secondary | ICD-10-CM

## 2017-11-15 DIAGNOSIS — Z9889 Other specified postprocedural states: Secondary | ICD-10-CM

## 2017-11-15 DIAGNOSIS — K5903 Drug induced constipation: Secondary | ICD-10-CM

## 2017-11-15 DIAGNOSIS — R351 Nocturia: Secondary | ICD-10-CM

## 2017-11-15 DIAGNOSIS — T402X5A Adverse effect of other opioids, initial encounter: Secondary | ICD-10-CM

## 2017-11-15 DIAGNOSIS — I10 Essential (primary) hypertension: Secondary | ICD-10-CM

## 2017-11-15 LAB — POCT GLYCOSYLATED HEMOGLOBIN (HGB A1C): Hemoglobin A1C: 6 % — AB (ref 4.0–5.6)

## 2017-11-15 MED ORDER — LISINOPRIL 40 MG PO TABS: 40.0000 mg | ORAL_TABLET | Freq: Every day | ORAL | 1 refills | Status: DC

## 2017-11-15 MED ORDER — ATORVASTATIN CALCIUM 20 MG PO TABS: 20.0000 mg | ORAL_TABLET | Freq: Every day | ORAL | 1 refills | Status: DC

## 2017-11-15 MED ORDER — TAMSULOSIN HCL 0.4 MG PO CAPS: 0.4000 mg | ORAL_CAPSULE | Freq: Every day | ORAL | 1 refills | Status: DC

## 2017-11-15 MED ORDER — OMEGA-3-ACID ETHYL ESTERS 1 G PO CAPS: 2.0000 g | ORAL_CAPSULE | Freq: Two times a day (BID) | ORAL | 1 refills | Status: DC

## 2017-11-15 MED ORDER — METFORMIN HCL ER 750 MG PO TB24: 1500.0000 mg | ORAL_TABLET | Freq: Every day | ORAL | 1 refills | Status: DC

## 2017-11-15 NOTE — Progress Notes (Signed)
Name: Lawrence PaulsMarshall R Lombardo Jr.   MRN: [id number]    DOB: 1955-06-06   Date:11/15/2017       Progress Note  Subjective  Chief Complaint  Chief Complaint  Patient presents with  . Hypertension    3 month recheck  . Hyperlipidemia  . Diabetes    HPI    DMII: diagnosed May 2019 with a hgb 6.9% He denies polydipsia ( but states likes water), no polyphagia, has nocturia from BPH, not every night. Discussed yearly eye exam, urine micro was negative . Denies blurred vision. He has high triglycerides and he was  gemfibrozil, but we switched from gemfibrozil to Lovaza in May since he has been taking atorvastatin and denies any side effects of medications. He has been eating better and avoiding sweets, even though his wife likes to bake.   Chronic pain and opioid use: he was given Linzess but too expensive, we switched to Amitiza, but he states price is about the same and next refill he would like to switch to Linzess since it seems to work better. He states bowel movements are now daily, but states needs to take medication occasionally to help him have a bowel movement.  Morbid obesity: he has been eating a better breakfast, avoiding ice cream now and has lost 4 lbs since last visit.   BPH: taking cardura but states not helping, we will change to flomax today.   HTN: taking lisinopril and denies side effects, no chest pain or palpitation   Patient Active Problem List   Diagnosis Date Noted  . Dyslipidemia associated with type 2 diabetes mellitus (HCC) 08/13/2017  . Osteoarthritis of acromioclavicular joint 07/30/2017  . S/P lumbar fusion 07/13/2017  . Chronic pain syndrome 07/13/2017  . Personal history of colonic polyps   . Constipation due to opioid therapy 07/18/2015  . Hypertriglyceridemia 01/22/2015  . Extreme obesity 01/22/2015  . S/P knee replacement 12/21/2014  . Chronic low back pain 09/21/2014  . BPH associated with nocturia 09/21/2014  . Dyslipidemia 09/21/2014  .  Continuous opioid dependence (HCC) 09/21/2014  . DDD (degenerative disc disease), lumbar 09/21/2014  . Failure of erection 09/21/2014    Past Surgical History:  Procedure Laterality Date  . APPENDECTOMY  age 62  . arthroscopic shoulder surgery Right 08/31/2017  . BACK SURGERY  lower back surgery x 3    L 3 L 4 and L 5 are fused together  . COLONOSCOPY WITH PROPOFOL N/A 05/04/2017   Procedure: COLONOSCOPY WITH PROPOFOL;  Surgeon: Midge MiniumWohl, Darren, MD;  [facility];  Service: Endoscopy;  Laterality: N/A;  . LUMBAR FUSION  1999   L- to L5  . metal removed from eye  years ago  . sebaceous cyst removed Right 15 years ago   middle  finger   . TOE FUSION Right    4th toe  . TOTAL KNEE ARTHROPLASTY Left 08/10/2014   Procedure: LEFT TOTAL KNEE ARTHROPLASTY;  Surgeon: Eugenia Mcalpineobert Collins, MD;  Location: [facility];  Service: Orthopedics;  Laterality: Left;  . TOTAL KNEE ARTHROPLASTY Right 12/21/2014   Procedure: RIGHT TOTAL KNEE ARTHROPLASTY;  Surgeon: Eugenia Mcalpineobert Collins, MD;  Location: [facility];  Service: Orthopedics;  Laterality: Right;  . TOTAL KNEE ARTHROPLASTY Right 12/21/14    Family History  Problem Relation Age of Onset  . Cancer Mother        Throat  . Heart attack Mother   . Hypertension Father   . Hyperlipidemia Father     Social History   Socioeconomic History  .  Marital status: Married    Spouse name: Not on file  . Number of children: Not on file  . Years of education: Not on file  . Highest education level: Not on file  Occupational History  . Not on file  Social Needs  . Financial resource strain: Not on file  . Food insecurity:    Worry: Not on file    Inability: Not on file  . Transportation needs:    Medical: Not on file    Non-medical: Not on file  Tobacco Use  . Smoking status: Former Smoker    Last attempt to quit: 04/07/1999    Years since quitting: 18.6  . Smokeless tobacco: Never Used  Substance and Sexual Activity  . Alcohol use: No  . Drug use: No  .  Sexual activity: Yes    Birth control/protection: Condom  Lifestyle  . Physical activity:    Days per week: Not on file    Minutes per session: Not on file  . Stress: Not on file  Relationships  . Social connections:    Talks on phone: Not on file    Gets together: Not on file    Attends religious service: Not on file    Active member of club or organization: Not on file    Attends meetings of clubs or organizations: Not on file    Relationship status: Not on file  . Intimate partner violence:    Fear of current or ex partner: Not on file    Emotionally abused: Not on file    Physically abused: Not on file    Forced sexual activity: Not on file  Other Topics Concern  . Not on file  Social History Narrative  . Not on file     Current Outpatient Medications:  .  aspirin EC 81 MG tablet, Take 1 tablet (81 mg total) by mouth daily., Disp: 30 tablet, Rfl: 0 .  atorvastatin (LIPITOR) 20 MG tablet, Take 1 tablet (20 mg total) by mouth daily., Disp: 90 tablet, Rfl: 1 .  lisinopril (PRINIVIL,ZESTRIL) 40 MG tablet, Take 1 tablet (40 mg total) by mouth at bedtime., Disp: 90 tablet, Rfl: 1 .  lubiprostone (AMITIZA) 24 MCG capsule, Take 1 capsule (24 mcg total) by mouth 2 (two) times daily with a meal., Disp: 180 capsule, Rfl: 1 .  metFORMIN (GLUCOPHAGE-XR) 750 MG 24 hr tablet, Take 2 tablets (1,500 mg total) by mouth daily with breakfast., Disp: 180 tablet, Rfl: 1 .  omega-3 acid ethyl esters (LOVAZA) 1 g capsule, Take 2 capsules (2 g total) by mouth 2 (two) times daily., Disp: 360 capsule, Rfl: 1 .  oxyCODONE (OXYCONTIN) 40 mg 12 hr tablet, Take 1 tablet (40 mg total) by mouth every 12 (twelve) hours. For chronic pain. To last for 30 days from fill date.  DNF: 09/28/17, 10/27/17, 11/26/17, Disp: 60 tablet, Rfl: 0 .  oxyCODONE-acetaminophen (PERCOCET) 10-325 MG tablet, Take 1 tablet by mouth 2 (two) times daily as needed for pain. For chronic pain. To last for 30 days from fill date.  DNF:  09/28/17, 10/27/17, 11/26/17, Disp: 60 tablet, Rfl: 0 .  tamsulosin (FLOMAX) 0.4 MG CAPS capsule, Take 1 capsule (0.4 mg total) by mouth daily., Disp: 90 capsule, Rfl: 1 .  tiZANidine (ZANAFLEX) 4 MG tablet, take 1 tablet by mouth at bedtime, Disp: 30 tablet, Rfl: 2  No Known Allergies   ROS  Constitutional: Negative for fever, positive for mild  weight change.  Respiratory: Negative for cough  and shortness of breath.   Cardiovascular: Negative for chest pain or palpitations.  Gastrointestinal: Negative for abdominal pain, no bowel changes.  Musculoskeletal: Positive  for gait problem - walks slowly but no  joint swelling.  Skin: Negative for rash.  Neurological: Negative for dizziness or headache.  No other specific complaints in a complete review of systems (except as listed in HPI above).  Objective  Vitals:   11/15/17 0817  BP: 138/72  Pulse: 97  Resp: 18  Temp: 98 F (36.7 C)  TempSrc: Oral  SpO2: 94%  Weight: 271 lb 12.8 oz (123.3 kg)  Height: 6' (1.829 m)    Body mass index is 36.86 kg/m.  Physical Exam  Constitutional: Patient appears well-developed and well-nourished. Obese  No distress.  HEENT: head atraumatic, normocephalic, pupils equal and reactive to light, neck supple, throat within normal limits Cardiovascular: Normal rate, regular rhythm and normal heart sounds.  No murmur heard. No BLE edema. Pulmonary/Chest: Effort normal and breath sounds normal. No respiratory distress. Abdominal: Soft.  There is no tenderness. Psychiatric: Patient has a normal mood and affect. behavior is normal. Judgment and thought content normal. Muscular Skeletal: right shoulder has good rom, still having PT   PHQ2/9: Depression screen [facility] 2/9 11/15/2017 09/14/2017 07/13/2017 05/13/2017 05/07/2017  Decreased Interest 0 0 0 0 0  Down, Depressed, Hopeless 0 0 0 0 0  PHQ - 2 Score 0 0 0 0 0  Altered sleeping 0 - - - -  Tired, decreased energy 0 - - - -  Change in appetite 0 - - - -   Feeling bad or failure about yourself  0 - - - -  Trouble concentrating 0 - - - -  Moving slowly or fidgety/restless 0 - - - -  Suicidal thoughts 0 - - - -  PHQ-9 Score 0 - - - -  Difficult doing work/chores Not difficult at all - - - -     Fall Risk: Fall Risk  11/15/2017 09/14/2017 08/13/2017 07/13/2017 05/13/2017  Falls in the past year? No No No No No      Assessment & Plan  1. Dyslipidemia associated with type 2 diabetes mellitus (HCC)  -hgbA1C POTC:  - atorvastatin (LIPITOR) 20 MG tablet; Take 1 tablet (20 mg total) by mouth daily.  Dispense: 90 tablet; Refill: 1 - metFORMIN (GLUCOPHAGE-XR) 750 MG 24 hr tablet; Take 2 tablets (1,500 mg total) by mouth daily with breakfast.  Dispense: 180 tablet; Refill: 1 - omega-3 acid ethyl esters (LOVAZA) 1 g capsule; Take 2 capsules (2 g total) by mouth 2 (two) times daily.  Dispense: 360 capsule; Refill: 1  2. Essential hypertension  - lisinopril (PRINIVIL,ZESTRIL) 40 MG tablet; Take 1 tablet (40 mg total) by mouth at bedtime.  Dispense: 90 tablet; Refill: 1  3. BPH associated with nocturia  - tamsulosin (FLOMAX) 0.4 MG CAPS capsule; Take 1 capsule (0.4 mg total) by mouth daily.  Dispense: 90 capsule; Refill: 1  4. Morbid obesity (HCC)  Discussed with the patient the risk posed by an increased BMI. Discussed importance of portion control, calorie counting and at least 150 minutes of physical activity weekly. Avoid sweet beverages and drink more water. Eat at least 6 servings of fruit and vegetables daily   5. Constipation due to opioid therapy  Continue prn Amitiza  6. History of arthroscopic surgery of shoulder  Doing well, continue PT   7. Dyslipidemia  Continue medication

## 2017-12-15 ENCOUNTER — Ambulatory Visit: Payer: BLUE CROSS/BLUE SHIELD | Attending: Nurse Practitioner | Admitting: Nurse Practitioner

## 2017-12-15 ENCOUNTER — Encounter: Payer: Self-pay | Admitting: Nurse Practitioner

## 2017-12-15 VITALS — BP 173/90 | HR 83 | Temp 98.4°F | Resp 16 | Ht 72.0 in | Wt 270.0 lb

## 2017-12-15 DIAGNOSIS — N401 Enlarged prostate with lower urinary tract symptoms: Secondary | ICD-10-CM | POA: Insufficient documentation

## 2017-12-15 DIAGNOSIS — G894 Chronic pain syndrome: Secondary | ICD-10-CM | POA: Diagnosis present

## 2017-12-15 DIAGNOSIS — Z86010 Personal history of colonic polyps: Secondary | ICD-10-CM | POA: Insufficient documentation

## 2017-12-15 DIAGNOSIS — Z8249 Family history of ischemic heart disease and other diseases of the circulatory system: Secondary | ICD-10-CM | POA: Diagnosis not present

## 2017-12-15 DIAGNOSIS — Z87891 Personal history of nicotine dependence: Secondary | ICD-10-CM | POA: Insufficient documentation

## 2017-12-15 DIAGNOSIS — Z79899 Other long term (current) drug therapy: Secondary | ICD-10-CM | POA: Insufficient documentation

## 2017-12-15 DIAGNOSIS — M545 Low back pain: Secondary | ICD-10-CM

## 2017-12-15 DIAGNOSIS — M5136 Other intervertebral disc degeneration, lumbar region: Secondary | ICD-10-CM | POA: Diagnosis not present

## 2017-12-15 DIAGNOSIS — M62830 Muscle spasm of back: Secondary | ICD-10-CM

## 2017-12-15 DIAGNOSIS — E781 Pure hyperglyceridemia: Secondary | ICD-10-CM | POA: Insufficient documentation

## 2017-12-15 DIAGNOSIS — E119 Type 2 diabetes mellitus without complications: Secondary | ICD-10-CM | POA: Insufficient documentation

## 2017-12-15 DIAGNOSIS — Z79891 Long term (current) use of opiate analgesic: Secondary | ICD-10-CM

## 2017-12-15 DIAGNOSIS — E669 Obesity, unspecified: Secondary | ICD-10-CM | POA: Diagnosis not present

## 2017-12-15 DIAGNOSIS — Z7984 Long term (current) use of oral hypoglycemic drugs: Secondary | ICD-10-CM | POA: Insufficient documentation

## 2017-12-15 DIAGNOSIS — Z96653 Presence of artificial knee joint, bilateral: Secondary | ICD-10-CM | POA: Insufficient documentation

## 2017-12-15 DIAGNOSIS — I10 Essential (primary) hypertension: Secondary | ICD-10-CM | POA: Diagnosis not present

## 2017-12-15 DIAGNOSIS — Z859 Personal history of malignant neoplasm, unspecified: Secondary | ICD-10-CM | POA: Insufficient documentation

## 2017-12-15 DIAGNOSIS — F329 Major depressive disorder, single episode, unspecified: Secondary | ICD-10-CM | POA: Insufficient documentation

## 2017-12-15 DIAGNOSIS — G8929 Other chronic pain: Secondary | ICD-10-CM

## 2017-12-15 DIAGNOSIS — M549 Dorsalgia, unspecified: Secondary | ICD-10-CM | POA: Insufficient documentation

## 2017-12-15 DIAGNOSIS — Z981 Arthrodesis status: Secondary | ICD-10-CM | POA: Insufficient documentation

## 2017-12-15 DIAGNOSIS — Z9889 Other specified postprocedural states: Secondary | ICD-10-CM | POA: Diagnosis not present

## 2017-12-15 DIAGNOSIS — Z7982 Long term (current) use of aspirin: Secondary | ICD-10-CM | POA: Insufficient documentation

## 2017-12-15 MED ORDER — OXYCODONE-ACETAMINOPHEN 10-325 MG PO TABS: 1.0000 | ORAL_TABLET | Freq: Two times a day (BID) | ORAL | 0 refills | Status: DC

## 2017-12-15 MED ORDER — OXYCODONE-ACETAMINOPHEN 10-325 MG PO TABS: 1.0000 | ORAL_TABLET | Freq: Two times a day (BID) | ORAL | 0 refills | Status: DC | PRN

## 2017-12-15 MED ORDER — OXYCODONE HCL ER 40 MG PO T12A: 40.0000 mg | EXTENDED_RELEASE_TABLET | Freq: Two times a day (BID) | ORAL | 0 refills | Status: DC

## 2017-12-15 MED ORDER — TIZANIDINE HCL 4 MG PO TABS: 4.0000 mg | ORAL_TABLET | Freq: Every day | ORAL | 2 refills | Status: DC

## 2017-12-15 NOTE — Progress Notes (Signed)
Patient's Name: Lawrence Thornton.  MRN: [id number]  Referring Provider: Steele Sizer, MD  DOB: 1955-04-16  PCP: Steele Sizer, MD  DOS: 12/15/2017  Note by: Vevelyn Francois NP  Service setting: Ambulatory outpatient  Specialty: Interventional Pain Management  Location: [facility] (AMB) Pain Management Facility    Patient type: Established    Primary Reason(s) for Visit: Encounter for prescription drug management. (Level of risk: moderate)  CC: Back Pain (right lumbar )  HPI  Lawrence Thornton is a 62 y.o. year old, male patient, who comes today for a medication management evaluation. He has Chronic low back pain; BPH associated with nocturia; Dyslipidemia; Continuous opioid dependence ([place]); DDD (degenerative disc disease), lumbar; Failure of erection; S/P knee replacement; Hypertriglyceridemia; Extreme obesity; Constipation due to opioid therapy; Personal history of colonic polyps; S/P lumbar fusion; Chronic pain syndrome; Osteoarthritis of acromioclavicular joint; Dyslipidemia associated with type 2 diabetes mellitus ([place]); and Long term current use of opiate analgesic on their problem list. His primarily concern today is the Back Pain (right lumbar )  Pain Assessment: Location: Lower, Right Back Radiating: denies  Onset: More than a month ago Duration: Chronic pain Quality: Discomfort, Sharp, Dull, Constant Severity: 4 /10 (subjective, self-reported pain score)  Note: Reported level is compatible with observation.                          Effect on ADL: sitting in one spot or stand in one spot makes it worse Timing: Constant Modifying factors: medications help, trying to walk everyday.  BP: (!) 173/90  HR: 83  Lawrence Thornton was last scheduled for an appointment on Visit date not found for medication management. During today's appointment we reviewed Lawrence Thornton's chronic pain status, as well as his outpatient medication regimen.  He admits that the pain is worse on the right only. He is  status post spinal fusion. He denies any leg pain.  He denies any new concerns today.  He denies any side effects of his medication.  He does work a Architect.  He admits that he is working on his diet has lost approximately 15 pounds.  The patient  reports that he does not use drugs. His body mass index is 36.62 kg/m.  Further details on both, my assessment(s), as well as the proposed treatment plan, please see below.  Controlled Substance Pharmacotherapy Assessment REMS (Risk Evaluation and Mitigation Strategy)  Percocet 10 mg2times daily as needed for breakthrough, OxyContin 40 mg twice daily. MME/day:Percocet equals30, OxyContin equals 162m/daytotal equals 150 (reduced from 165)  Lawrence Billow RN  12/15/2017  9:07 AM  Sign at close encounter Nursing Pain Medication Assessment:  Safety precautions to be maintained throughout the outpatient stay will include: orient to surroundings, keep bed in low position, maintain call bell within reach at all times, provide assistance with transfer out of bed and ambulation.  Medication Inspection Compliance: Pill count conducted under aseptic conditions, in front of the patient. Neither the pills nor the bottle was removed from the patient's sight at any time. Once count was completed pills were immediately returned to the patient in their original bottle.  Medication #1: Oxycodone ER (OxyContin) Pill/Patch Count: 23 of 60 pills remain Pill/Patch Appearance: Markings consistent with prescribed medication Bottle Appearance: Standard pharmacy container. Clearly labeled. Filled Date: 08 / 23 / 2019 Last Medication intake:  Today  Medication #2: Oxycodone/APAP Pill/Patch Count: 23 of 60 pills remain Pill/Patch Appearance: Markings consistent with prescribed medication  Bottle Appearance: Standard pharmacy container. Clearly labeled. Filled Date: 08 / 23 / 2019 Last Medication intake:  Today   Pharmacokinetics: Liberation and  absorption (onset of action): WNL Distribution (time to peak effect): WNL Metabolism and excretion (duration of action): WNL         Pharmacodynamics: Desired effects: Analgesia: Lawrence Thornton reports >50% benefit. Functional ability: Patient reports that medication allows him to accomplish basic ADLs Clinically meaningful improvement in function (CMIF): Sustained CMIF goals met Perceived effectiveness: Described as relatively effective, allowing for increase in activities of daily living (ADL) Undesirable effects: Side-effects or Adverse reactions: None reported Monitoring: [place] PMP: Online review of the past 2-monthperiod conducted. Compliant with practice rules and regulations Last UDS on record: Summary  Date Value Ref Range Status  03/11/2017 FINAL  Final    Comment:    ==================================================================== TOXAS[facility]RE COMP DRUG ANALYSIS,UR ==================================================================== Test                             Result       Flag       Units Drug Present and Declared for Prescription Verification   Oxycodone                      2974         EXPECTED   ng/mg creat   Oxymorphone                    1740         EXPECTED   ng/mg creat   Noroxycodone                   2878         EXPECTED   ng/mg creat   Noroxymorphone                 420          EXPECTED   ng/mg creat    Sources of oxycodone are scheduled prescription medications.    Oxymorphone, noroxycodone, and noroxymorphone are expected    metabolites of oxycodone. Oxymorphone is also available as a    scheduled prescription medication.   Acetaminophen                  PRESENT      EXPECTED Drug Absent but Declared for Prescription Verification   Tizanidine                     Not Detected UNEXPECTED    Tizanidine, as indicated in the declared medication list, is not    always detected even when used as  directed. ==================================================================== Test                      Result    Flag   Units      Ref Range   Creatinine              112              mg/dL      >=20 ==================================================================== Declared Medications:  The flagging and interpretation on this report are based on the  following declared medications.  Unexpected results may arise from  inaccuracies in the declared medications.  **Note: The testing scope of this panel includes these medications:  Oxycodone  Oxycodone (Oxycodone Acetaminophen)  **Note: The testing scope of this panel does not include  small to  moderate amounts of these reported medications:  Acetaminophen (Oxycodone Acetaminophen)  Tizanidine  **Note: The testing scope of this panel does not include following  reported medications:  Doxazosin  Gemfibrozil  Linaclotide  Lisinopril ==================================================================== For clinical consultation, please call [phone]. ====================================================================    UDS interpretation: Compliant          Medication Assessment Form: Reviewed. Patient indicates being compliant with therapy Treatment compliance: Compliant Risk Assessment Profile: Aberrant behavior: See prior evaluations. None observed or detected today Comorbid factors increasing risk of overdose: See prior notes. No additional risks detected today Opioid risk tool (ORT) (Total Score): 0 Personal History of Substance Abuse (SUD-Substance use disorder):  Alcohol: Negative  Illegal Drugs: Negative  Rx Drugs: Negative  ORT Risk Level calculation: Low Risk Risk of substance use disorder (SUD): Low Opioid Risk Tool - 12/15/17 0902      Family History of Substance Abuse   Alcohol  Negative    Illegal Drugs  Negative    Rx Drugs  Negative      Personal History of Substance Abuse   Alcohol  Negative     Illegal Drugs  Negative    Rx Drugs  Negative      Psychological Disease   Psychological Disease  Negative    Depression  Negative      Total Score   Opioid Risk Tool Scoring  0    Opioid Risk Interpretation  Low Risk      ORT Scoring interpretation table:  Score <3 = Low Risk for SUD  Score between 4-7 = Moderate Risk for SUD  Score >8 = High Risk for Opioid Abuse   Risk Mitigation Strategies:  Patient Counseling: Covered Patient-Prescriber Agreement (PPA): Present and active  Notification to other healthcare providers: Done  Pharmacologic Plan: No change in therapy, at this time.             Laboratory Chemistry  Inflammation Markers (CRP: Acute Phase) (ESR: Chronic Phase) No results found for: CRP, ESRSEDRATE, LATICACIDVEN                       Rheumatology Markers No results found for: RF, ANA, LABURIC, URICUR, LYMEIGGIGMAB, LYMEABIGMQN, HLAB27                      Renal Function Markers Lab Results  Component Value Date   BUN 13 08/13/2017   CREATININE 0.86 35/00/9381   BCR NOT APPLICABLE 82/99/3716   GFRAA 108 08/13/2017   GFRNONAA 94 08/13/2017                             Hepatic Function Markers Lab Results  Component Value Date   AST 23 08/13/2017   ALT 26 08/13/2017   ALBUMIN 4.6 11/11/2015   ALKPHOS 76 11/11/2015   HCVAB NEGATIVE 10/06/2016                        Electrolytes Lab Results  Component Value Date   NA 138 08/13/2017   K 4.6 08/13/2017   CL 102 08/13/2017   CALCIUM 9.7 08/13/2017                        Neuropathy Markers Lab Results  Component Value Date   HGBA1C 6.0 (A) 11/15/2017  CNS Tests No results found for: SDES, GRAMSTAIN, CULT, COLORCSF, APPEARCSF, RBCCOUNTCSF, WBCCSF, POLYSCSF, LYMPHSCSF, EOSCSF, PROTEINCSF, GLUCCSF, JCVIRUS, CSFOLI, IGGCSF, IGGALB, IGGIND                      Bone Pathology Markers Lab Results  Component Value Date   VD25OH 43 10/06/2016                          Coagulation Parameters Lab Results  Component Value Date   INR 1.07 12/13/2014   LABPROT 14.1 12/13/2014   APTT 34 12/13/2014   PLT 274 08/13/2017                        Cardiovascular Markers Lab Results  Component Value Date   HGB 13.9 08/13/2017   HCT 40.0 08/13/2017                         CA Markers No results found for: CEA, CA125, LABCA2                      Note: Lab results reviewed.  Recent Diagnostic Imaging Results   Complexity Note: Imaging results reviewed. Results shared with Mr. Aldaco, using Layman's terms.                         Meds   Current Outpatient Medications:  .  aspirin EC 81 MG tablet, Take 1 tablet (81 mg total) by mouth daily., Disp: 30 tablet, Rfl: 0 .  atorvastatin (LIPITOR) 20 MG tablet, Take 1 tablet (20 mg total) by mouth daily., Disp: 90 tablet, Rfl: 1 .  lisinopril (PRINIVIL,ZESTRIL) 40 MG tablet, Take 1 tablet (40 mg total) by mouth at bedtime., Disp: 90 tablet, Rfl: 1 .  lubiprostone (AMITIZA) 24 MCG capsule, Take 1 capsule (24 mcg total) by mouth 2 (two) times daily with a meal., Disp: 180 capsule, Rfl: 1 .  metFORMIN (GLUCOPHAGE-XR) 750 MG 24 hr tablet, Take 2 tablets (1,500 mg total) by mouth daily with breakfast., Disp: 180 tablet, Rfl: 1 .  omega-3 acid ethyl esters (LOVAZA) 1 g capsule, Take 2 capsules (2 g total) by mouth 2 (two) times daily., Disp: 360 capsule, Rfl: 1 .  oxyCODONE (OXYCONTIN) 40 mg 12 hr tablet, Take 1 tablet (40 mg total) by mouth every 12 (twelve) hours. For chronic pain. To last for 30 days from fill date.  DNF: 09/28/17, 10/27/17, 11/26/17, Disp: 60 tablet, Rfl: 0 .  tamsulosin (FLOMAX) 0.4 MG CAPS capsule, Take 1 capsule (0.4 mg total) by mouth daily., Disp: 90 capsule, Rfl: 1 .  tiZANidine (ZANAFLEX) 4 MG tablet, Take 1 tablet (4 mg total) by mouth at bedtime., Disp: 30 tablet, Rfl: 2 .  [START ON 02/24/2018] oxyCODONE (OXYCONTIN) 40 mg 12 hr tablet, Take 1 tablet (40 mg total) by mouth every 12 (twelve)  hours., Disp: 60 tablet, Rfl: 0 .  [START ON 01/25/2018] oxyCODONE (OXYCONTIN) 40 mg 12 hr tablet, Take 1 tablet (40 mg total) by mouth every 12 (twelve) hours., Disp: 60 tablet, Rfl: 0 .  [START ON 12/26/2017] oxyCODONE (OXYCONTIN) 40 mg 12 hr tablet, Take 1 tablet (40 mg total) by mouth every 12 (twelve) hours., Disp: 60 tablet, Rfl: 0 .  [START ON 02/24/2018] oxyCODONE-acetaminophen (PERCOCET) 10-325 MG tablet, Take 1 tablet by mouth 2 (two) times daily as needed for  pain., Disp: 60 tablet, Rfl: 0 .  [START ON 01/25/2018] oxyCODONE-acetaminophen (PERCOCET) 10-325 MG tablet, Take 1 tablet by mouth 2 (two) times daily., Disp: 60 tablet, Rfl: 0 .  [START ON 12/26/2017] oxyCODONE-acetaminophen (PERCOCET) 10-325 MG tablet, Take 1 tablet by mouth 2 (two) times daily., Disp: 60 tablet, Rfl: 0  ROS  Constitutional: Denies any fever or chills Gastrointestinal: No reported hemesis, hematochezia, vomiting, or acute GI distress Musculoskeletal: Denies any acute onset joint swelling, redness, loss of ROM, or weakness Neurological: No reported episodes of acute onset apraxia, aphasia, dysarthria, agnosia, amnesia, paralysis, loss of coordination, or loss of consciousness  Allergies  Mr. Bojarski has No Known Allergies.  [place]  Drug: Mr. Stacey  reports that he does not use drugs. Alcohol:  reports that he does not drink alcohol. Tobacco:  reports that he quit smoking about 18 years ago. He has never used smokeless tobacco. Medical:  has a past medical history of Arthritis and Hypertension. Surgical: Mr. Buckles  has a past surgical history that includes Back surgery (lower back surgery x 3 ); Appendectomy (age 57); sebaceous cyst removed (Right, 15 years ago); Toe Fusion (Right); metal removed from eye (years ago); Total knee arthroplasty (Left, 08/10/2014); Total knee arthroplasty (Right, 12/21/2014); Total knee arthroplasty (Right, 12/21/14); Lumbar fusion (1999); Colonoscopy with propofol (N/A, 05/04/2017);  and arthroscopic shoulder surgery (Right, 08/31/2017). Family: family history includes Cancer in his mother; Heart attack in his mother; Hyperlipidemia in his father; Hypertension in his father.  Constitutional Exam  General appearance: Well nourished, well developed, and well hydrated. In no apparent acute distress Vitals:   12/15/17 0857 12/15/17 0900  BP: (!) 166/109 (!) 173/90  Pulse: 83   Resp: 16   Temp: 98.4 F (36.9 C)   TempSrc: Oral   SpO2: 95%   Weight: 270 lb (122.5 kg)   Height: 6' (1.829 m)   Psych/Mental status: Alert, oriented x 3 (person, place, & time)       Eyes: PERLA Respiratory: No evidence of acute respiratory distress  Lumbar Spine Area Exam  Skin & Axial Inspection: Well healed scar from previous spine surgery detected Alignment: Symmetrical Functional ROM: Adequate ROM       Stability: No instability detected Muscle Tone/Strength: Functionally intact. No obvious neuro-muscular anomalies detected. Sensory (Neurological): Unimpaired Palpation: Complains of area being tender to palpation       Provocative Tests: Hyperextension/rotation test: Positive       Lumbar quadrant test (Kemp's test): deferred today       Lateral bending test: (-)        Gait & Posture Assessment  Ambulation: Unassisted Gait: Relatively normal for age and body habitus Posture: WNL   Lower Extremity Exam    Side: Right lower extremity  Side: Left lower extremity  Stability: No instability observed          Stability: No instability observed          Skin & Extremity Inspection: Skin color, temperature, and hair growth are WNL. No peripheral edema or cyanosis. No masses, redness, swelling, asymmetry, or associated skin lesions. No contractures.  Skin & Extremity Inspection: Skin color, temperature, and hair growth are WNL. No peripheral edema or cyanosis. No masses, redness, swelling, asymmetry, or associated skin lesions. No contractures.  Functional ROM: Unrestricted ROM                   Functional ROM: Unrestricted ROM  Muscle Tone/Strength: Functionally intact. No obvious neuro-muscular anomalies detected.  Muscle Tone/Strength: Functionally intact. No obvious neuro-muscular anomalies detected.  Sensory (Neurological): Unimpaired  Sensory (Neurological): Unimpaired  Palpation: No palpable anomalies  Palpation: No palpable anomalies   Assessment  Primary Diagnosis & Pertinent Problem List: The primary encounter diagnosis was Chronic right-sided low back pain without sciatica. Diagnoses of Spasm of back muscles, Chronic pain syndrome, and Long term current use of opiate analgesic were also pertinent to this visit.  Status Diagnosis  Controlled Controlled Controlled 1. Chronic right-sided low back pain without sciatica   2. Spasm of back muscles   3. Chronic pain syndrome   4. Long term current use of opiate analgesic     Problems updated and reviewed during this visit: Problem  Long Term Current Use of Opiate Analgesic   Plan of Care  Pharmacotherapy (Medications Ordered): Meds ordered this encounter  Medications  . oxyCODONE-acetaminophen (PERCOCET) 10-325 MG tablet    Sig: Take 1 tablet by mouth 2 (two) times daily as needed for pain.    Dispense:  60 tablet    Refill:  0    Order Specific Question:   Supervising Provider    Answer:   Milinda Pointer [phone]  . oxyCODONE-acetaminophen (PERCOCET) 10-325 MG tablet    Sig: Take 1 tablet by mouth 2 (two) times daily.    Dispense:  60 tablet    Refill:  0    Do not place this medication on "Automatic Refill". Patient may have prescription filled one day early if pharmacy is closed on scheduled refill date.    Order Specific Question:   Supervising Provider    Answer:   Milinda Pointer [phone]  . oxyCODONE (OXYCONTIN) 40 mg 12 hr tablet    Sig: Take 1 tablet (40 mg total) by mouth every 12 (twelve) hours.    Dispense:  60 tablet    Refill:  0    Do not place this medication on  "Automatic Refill". Patient may have prescription filled one day early if pharmacy is closed on scheduled refill date.    Order Specific Question:   Supervising Provider    Answer:   Milinda Pointer [phone]  . oxyCODONE-acetaminophen (PERCOCET) 10-325 MG tablet    Sig: Take 1 tablet by mouth 2 (two) times daily.    Dispense:  60 tablet    Refill:  0    Do not place this medication on "Automatic Refill". Patient may have prescription filled one day early if pharmacy is closed on scheduled refill date.    Order Specific Question:   Supervising Provider    Answer:   Milinda Pointer [phone]  . tiZANidine (ZANAFLEX) 4 MG tablet    Sig: Take 1 tablet (4 mg total) by mouth at bedtime.    Dispense:  30 tablet    Refill:  2    Order Specific Question:   Supervising Provider    Answer:   Milinda Pointer [phone]  . oxyCODONE (OXYCONTIN) 40 mg 12 hr tablet    Sig: Take 1 tablet (40 mg total) by mouth every 12 (twelve) hours.    Dispense:  60 tablet    Refill:  0    Do not place this medication on "Automatic Refill". Patient may have prescription filled one day early if pharmacy is closed on scheduled refill date.    Order Specific Question:   Supervising Provider    Answer:   Milinda Pointer [phone]  . oxyCODONE (OXYCONTIN) 40 mg 12 hr tablet  Sig: Take 1 tablet (40 mg total) by mouth every 12 (twelve) hours.    Dispense:  60 tablet    Refill:  0    Do not place this medication on "Automatic Refill". Patient may have prescription filled one day early if pharmacy is closed on scheduled refill date.    Order Specific Question:   Supervising Provider    Answer:   Milinda Pointer [phone]   New Prescriptions   OXYCODONE (OXYCONTIN) 40 MG 12 HR TABLET    Take 1 tablet (40 mg total) by mouth every 12 (twelve) hours.   OXYCODONE (OXYCONTIN) 40 MG 12 HR TABLET    Take 1 tablet (40 mg total) by mouth every 12 (twelve) hours.   OXYCODONE (OXYCONTIN) 40 MG 12 HR TABLET    Take 1  tablet (40 mg total) by mouth every 12 (twelve) hours.   OXYCODONE-ACETAMINOPHEN (PERCOCET) 10-325 MG TABLET    Take 1 tablet by mouth 2 (two) times daily.   OXYCODONE-ACETAMINOPHEN (PERCOCET) 10-325 MG TABLET    Take 1 tablet by mouth 2 (two) times daily.   Medications administered today: Georg Ruddle. Huey Romans. had no medications administered during this visit. Lab-work, procedure(s), and/or referral(s): Orders Placed This Encounter  Procedures  . ToxASSURE Select 13 (MW), Urine   Imaging and/or referral(s): None  Interventional therapies: Planned, scheduled, and/or pending:   Not at this time.    Provider-requested follow-up: Return in about 3 months (around 03/16/2018).  Future Appointments  Date Time Provider [place]  03/15/2018  8:45 AM Vevelyn Francois, NP [facility] None  03/18/2018  7:40 AM Steele Sizer, MD [place] [facility]   Primary Care Physician: Steele Sizer, MD Location: [facility] Out[facility] Management Facility Note by: Vevelyn Francois NP Date: 12/15/2017; Time: 11:14 AM  Pain Score Disclaimer: We use the NRS-11 scale. This is a self-reported, subjective measurement of pain severity with only modest accuracy. It is used primarily to identify changes within a particular patient. It must be understood that outpatient pain scales are significantly less accurate that those used for research, where they can be applied under ideal controlled circumstances with minimal exposure to variables. In reality, the score is likely to be a combination of pain intensity and pain affect, where pain affect describes the degree of emotional arousal or changes in action readiness caused by the sensory experience of pain. Factors such as social and work situation, setting, emotional state, anxiety levels, expectation, and prior pain experience may influence pain perception and show large inter-individual differences that may also be affected by time variables.  Patient  instructions provided during this appointment: Patient Instructions  ____________________________________________________________________________________________  Medication Rules  Applies to: All patients receiving prescriptions (written or electronic).  Pharmacy of record: Pharmacy where electronic prescriptions will be sent. If written prescriptions are taken to a different pharmacy, please inform the nursing staff. The pharmacy listed in the electronic medical record should be the one where you would like electronic prescriptions to be sent.  Prescription refills: Only during scheduled appointments. Applies to both, written and electronic prescriptions.  NOTE: The following applies primarily to controlled substances (Opioid* Pain Medications).   Patient's responsibilities: 1. Pain Pills: Bring all pain pills to every appointment (except for procedure appointments). 2. Pill Bottles: Bring pills in original pharmacy bottle. Always bring newest bottle. Bring bottle, even if empty. 3. Medication refills: You are responsible for knowing and keeping track of what medications you need refilled. The day before your appointment, write a list of  all prescriptions that need to be refilled. Bring that list to your appointment and give it to the admitting nurse. Prescriptions will be written only during appointments. If you forget a medication, it will not be "Called in", "Faxed", or "electronically sent". You will need to get another appointment to get these prescribed. 4. Prescription Accuracy: You are responsible for carefully inspecting your prescriptions before leaving our office. Have the discharge nurse carefully go over each prescription with you, before taking them home. Make sure that your name is accurately spelled, that your address is correct. Check the name and dose of your medication to make sure it is accurate. Check the number of pills, and the written instructions to make sure they are  clear and accurate. Make sure that you are given enough medication to last until your next medication refill appointment. 5. Taking Medication: Take medication as prescribed. Never take more pills than instructed. Never take medication more frequently than prescribed. Taking less pills or less frequently is permitted and encouraged, when it comes to controlled substances (written prescriptions).  6. Inform other Doctors: Always inform, all of your healthcare providers, of all the medications you take. 7. Pain Medication from other Providers: You are not allowed to accept any additional pain medication from any other Doctor or Healthcare provider. There are two exceptions to this rule. (see below) In the event that you require additional pain medication, you are responsible for notifying us, as stated below. 8. Medication Agreement: You are responsible for carefully reading and following our Medication Agreement. This must be signed before receiving any prescriptions from our practice. Safely store a copy of your signed Agreement. Violations to the Agreement will result in no further prescriptions. (Additional copies of our Medication Agreement are available upon request.) 9. Laws, Rules, & Regulations: All patients are expected to follow all Federal and [employer], [employer], Rules, [employer]. Ignorance of the Laws does not constitute a valid excuse. The use of any illegal substances is prohibited. 10. Adopted CDC guidelines & recommendations: Target dosing levels will be at or below 60 MME/day. Use of benzodiazepines** is not recommended.  Exceptions: There are only two exceptions to the rule of not receiving pain medications from other Healthcare Providers. 1. Exception #1 (Emergencies): In the event of an emergency (i.e.: accident requiring emergency care), you are allowed to receive additional pain medication. However, you are responsible for: As soon as you are able, call our office [phone],  at any time of the day or night, and leave a message stating your name, the date and nature of the emergency, and the name and dose of the medication prescribed. In the event that your call is answered by a member of our staff, make sure to document and save the date, time, and the name of the person that took your information.  2. Exception #2 (Planned Surgery): In the event that you are scheduled by another doctor or dentist to have any type of surgery or procedure, you are allowed (for a period no longer than 30 days), to receive additional pain medication, for the acute post-op pain. However, in this case, you are responsible for picking up a copy of our "Post-op Pain Management for Surgeons" handout, and giving it to your surgeon or dentist. This document is available at our office, and does not require an appointment to obtain it. Simply go to our office during business hours (Monday-Thursday from 8:00 AM to 4:00 PM) (Friday 8:00 AM to 12:00 Noon) or if  you have a scheduled appointment with [place], prior to your surgery, and ask for it by name. In addition, you will need to provide [place] with your name, name of your surgeon, type of surgery, and date of procedure or surgery.  *Opioid medications include: morphine, codeine, oxycodone, oxymorphone, hydrocodone, hydromorphone, meperidine, tramadol, tapentadol, buprenorphine, fentanyl, methadone. **Benzodiazepine medications include: diazepam (Valium), alprazolam (Xanax), clonazepam (Klonopine), lorazepam (Ativan), clorazepate (Tranxene), chlordiazepoxide (Librium), estazolam (Prosom), oxazepam (Serax), temazepam (Restoril), triazolam (Halcion) (Last updated: 06/03/2017) ____________________________________________________________________________________________  Oxycodone - apap 10-325 mg x 2 months  oxycontin 30 mg

## 2017-12-15 NOTE — Patient Instructions (Addendum)
____________________________________________________________________________________________  Medication Rules  Applies to: All patients receiving prescriptions (written or electronic).  Pharmacy of record: Pharmacy where electronic prescriptions will be sent. If written prescriptions are taken to a different pharmacy, please inform the nursing staff. The pharmacy listed in the electronic medical record should be the one where you would like electronic prescriptions to be sent.  Prescription refills: Only during scheduled appointments. Applies to both, written and electronic prescriptions.  NOTE: The following applies primarily to controlled substances (Opioid* Pain Medications).   Patient's responsibilities: 1. Pain Pills: Bring all pain pills to every appointment (except for procedure appointments). 2. Pill Bottles: Bring pills in original pharmacy bottle. Always bring newest bottle. Bring bottle, even if empty. 3. Medication refills: You are responsible for knowing and keeping track of what medications you need refilled. The day before your appointment, write a list of all prescriptions that need to be refilled. Bring that list to your appointment and give it to the admitting nurse. Prescriptions will be written only during appointments. If you forget a medication, it will not be "Called in", "Faxed", or "electronically sent". You will need to get another appointment to get these prescribed. 4. Prescription Accuracy: You are responsible for carefully inspecting your prescriptions before leaving our office. Have the discharge nurse carefully go over each prescription with you, before taking them home. Make sure that your name is accurately spelled, that your address is correct. Check the name and dose of your medication to make sure it is accurate. Check the number of pills, and the written instructions to make sure they are clear and accurate. Make sure that you are given enough medication to last  until your next medication refill appointment. 5. Taking Medication: Take medication as prescribed. Never take more pills than instructed. Never take medication more frequently than prescribed. Taking less pills or less frequently is permitted and encouraged, when it comes to controlled substances (written prescriptions).  6. Inform other Doctors: Always inform, all of your healthcare providers, of all the medications you take. 7. Pain Medication from other Providers: You are not allowed to accept any additional pain medication from any other Doctor or Healthcare provider. There are two exceptions to this rule. (see below) In the event that you require additional pain medication, you are responsible for notifying us, as stated below. 8. Medication Agreement: You are responsible for carefully reading and following our Medication Agreement. This must be signed before receiving any prescriptions from our practice. Safely store a copy of your signed Agreement. Violations to the Agreement will result in no further prescriptions. (Additional copies of our Medication Agreement are available upon request.) 9. Laws, Rules, & Regulations: All patients are expected to follow all Federal and State Laws, Statutes, Rules, & Regulations. Ignorance of the Laws does not constitute a valid excuse. The use of any illegal substances is prohibited. 10. Adopted CDC guidelines & recommendations: Target dosing levels will be at or below 60 MME/day. Use of benzodiazepines** is not recommended.  Exceptions: There are only two exceptions to the rule of not receiving pain medications from other Healthcare Providers. 1. Exception #1 (Emergencies): In the event of an emergency (i.e.: accident requiring emergency care), you are allowed to receive additional pain medication. However, you are responsible for: As soon as you are able, call our office [phone], at any time of the day or night, and leave a message stating your name, the  date and nature of the emergency, and the name and dose of the medication   prescribed. In the event that your call is answered by a member of our staff, make sure to document and save the date, time, and the name of the person that took your information.  2. Exception #2 (Planned Surgery): In the event that you are scheduled by another doctor or dentist to have any type of surgery or procedure, you are allowed (for a period no longer than 30 days), to receive additional pain medication, for the acute post-op pain. However, in this case, you are responsible for picking up a copy of our "Post-op Pain Management for Surgeons" handout, and giving it to your surgeon or dentist. This document is available at our office, and does not require an appointment to obtain it. Simply go to our office during business hours (Monday-Thursday from 8:00 AM to 4:00 PM) (Friday 8:00 AM to 12:00 Noon) or if you have a scheduled appointment with [place], prior to your surgery, and ask for it by name. In addition, you will need to provide [place] with your name, name of your surgeon, type of surgery, and date of procedure or surgery.  *Opioid medications include: morphine, codeine, oxycodone, oxymorphone, hydrocodone, hydromorphone, meperidine, tramadol, tapentadol, buprenorphine, fentanyl, methadone. **Benzodiazepine medications include: diazepam (Valium), alprazolam (Xanax), clonazepam (Klonopine), lorazepam (Ativan), clorazepate (Tranxene), chlordiazepoxide (Librium), estazolam (Prosom), oxazepam (Serax), temazepam (Restoril), triazolam (Halcion) (Last updated: 06/03/2017) ____________________________________________________________________________________________  Oxycodone - apap 10-325 mg x 2 months  oxycontin 30 mg

## 2017-12-15 NOTE — Progress Notes (Signed)
Nursing Pain Medication Assessment:  Safety precautions to be maintained throughout the outpatient stay will include: orient to surroundings, keep bed in low position, maintain call bell within reach at all times, provide assistance with transfer out of bed and ambulation.  Medication Inspection Compliance: Pill count conducted under aseptic conditions, in front of the patient. Neither the pills nor the bottle was removed from the patient's sight at any time. Once count was completed pills were immediately returned to the patient in their original bottle.  Medication #1: Oxycodone ER (OxyContin) Pill/Patch Count: 23 of 60 pills remain Pill/Patch Appearance: Markings consistent with prescribed medication Bottle Appearance: Standard pharmacy container. Clearly labeled. Filled Date: 08 / 23 / 2019 Last Medication intake:  Today  Medication #2: Oxycodone/APAP Pill/Patch Count: 23 of 60 pills remain Pill/Patch Appearance: Markings consistent with prescribed medication Bottle Appearance: Standard pharmacy container. Clearly labeled. Filled Date: 08 / 23 / 2019 Last Medication intake:  Today

## 2017-12-22 LAB — TOXASSURE SELECT 13 (MW), URINE

## 2018-03-15 ENCOUNTER — Ambulatory Visit: Payer: BLUE CROSS/BLUE SHIELD | Attending: Nurse Practitioner | Admitting: Nurse Practitioner

## 2018-03-15 ENCOUNTER — Other Ambulatory Visit: Payer: Self-pay

## 2018-03-15 ENCOUNTER — Encounter: Payer: Self-pay | Admitting: Nurse Practitioner

## 2018-03-15 VITALS — BP 151/95 | HR 100 | Temp 97.9°F | Resp 16 | Ht 72.0 in | Wt 260.0 lb

## 2018-03-15 DIAGNOSIS — Z96653 Presence of artificial knee joint, bilateral: Secondary | ICD-10-CM | POA: Diagnosis not present

## 2018-03-15 DIAGNOSIS — Z5181 Encounter for therapeutic drug level monitoring: Secondary | ICD-10-CM | POA: Insufficient documentation

## 2018-03-15 DIAGNOSIS — E669 Obesity, unspecified: Secondary | ICD-10-CM | POA: Insufficient documentation

## 2018-03-15 DIAGNOSIS — G894 Chronic pain syndrome: Secondary | ICD-10-CM | POA: Diagnosis not present

## 2018-03-15 DIAGNOSIS — M47816 Spondylosis without myelopathy or radiculopathy, lumbar region: Secondary | ICD-10-CM

## 2018-03-15 DIAGNOSIS — M62830 Muscle spasm of back: Secondary | ICD-10-CM | POA: Diagnosis not present

## 2018-03-15 DIAGNOSIS — Z7982 Long term (current) use of aspirin: Secondary | ICD-10-CM | POA: Diagnosis not present

## 2018-03-15 DIAGNOSIS — E785 Hyperlipidemia, unspecified: Secondary | ICD-10-CM | POA: Insufficient documentation

## 2018-03-15 DIAGNOSIS — Z79891 Long term (current) use of opiate analgesic: Secondary | ICD-10-CM

## 2018-03-15 DIAGNOSIS — Z981 Arthrodesis status: Secondary | ICD-10-CM | POA: Insufficient documentation

## 2018-03-15 DIAGNOSIS — Z79899 Other long term (current) drug therapy: Secondary | ICD-10-CM | POA: Diagnosis not present

## 2018-03-15 DIAGNOSIS — M545 Low back pain, unspecified: Secondary | ICD-10-CM

## 2018-03-15 DIAGNOSIS — Z6835 Body mass index (BMI) 35.0-35.9, adult: Secondary | ICD-10-CM | POA: Diagnosis not present

## 2018-03-15 DIAGNOSIS — Z87891 Personal history of nicotine dependence: Secondary | ICD-10-CM | POA: Diagnosis not present

## 2018-03-15 DIAGNOSIS — Z8249 Family history of ischemic heart disease and other diseases of the circulatory system: Secondary | ICD-10-CM | POA: Insufficient documentation

## 2018-03-15 DIAGNOSIS — Z7984 Long term (current) use of oral hypoglycemic drugs: Secondary | ICD-10-CM | POA: Insufficient documentation

## 2018-03-15 DIAGNOSIS — G8929 Other chronic pain: Secondary | ICD-10-CM

## 2018-03-15 DIAGNOSIS — I10 Essential (primary) hypertension: Secondary | ICD-10-CM | POA: Insufficient documentation

## 2018-03-15 DIAGNOSIS — E119 Type 2 diabetes mellitus without complications: Secondary | ICD-10-CM | POA: Diagnosis not present

## 2018-03-15 MED ORDER — OXYCODONE-ACETAMINOPHEN 10-325 MG PO TABS: 1.0000 | ORAL_TABLET | Freq: Two times a day (BID) | ORAL | 0 refills | Status: DC | PRN

## 2018-03-15 MED ORDER — OXYCODONE HCL ER 40 MG PO T12A: 40.0000 mg | EXTENDED_RELEASE_TABLET | Freq: Two times a day (BID) | ORAL | 0 refills | Status: DC

## 2018-03-15 MED ORDER — TIZANIDINE HCL 4 MG PO TABS: 4.0000 mg | ORAL_TABLET | Freq: Every day | ORAL | 2 refills | Status: DC

## 2018-03-15 MED ORDER — OXYCODONE-ACETAMINOPHEN 10-325 MG PO TABS: 1.0000 | ORAL_TABLET | Freq: Two times a day (BID) | ORAL | 0 refills | Status: DC

## 2018-03-15 NOTE — Progress Notes (Signed)
Patient's Name: Lawrence Thornton.  MRN: [id number]  Referring Provider: Steele Sizer, MD  DOB: 05/12/1955  PCP: Steele Sizer, MD  DOS: 03/15/2018  Note by: Vevelyn Francois NP  Service setting: Ambulatory outpatient  Specialty: Interventional Pain Management  Location: [facility] (AMB) Pain Management Facility    Patient type: Established    Primary Reason(s) for Visit: Encounter for prescription drug management. (Level of risk: moderate)  CC: Back Pain (lower); Shoulder Pain (right); and Knee Pain (bilateral)  HPI  Lawrence Thornton is a 62 y.o. year old, male patient, who comes today for a medication management evaluation. He has Chronic low back pain; BPH associated with nocturia; Dyslipidemia; Continuous opioid dependence ([place]); DDD (degenerative disc disease), lumbar; Failure of erection; S/P knee replacement; Hypertriglyceridemia; Extreme obesity; Constipation due to opioid therapy; Personal history of colonic polyps; S/P lumbar fusion; Chronic pain syndrome; Osteoarthritis of acromioclavicular joint; Dyslipidemia associated with type 2 diabetes mellitus ([place]); Long term current use of opiate analgesic; and Lumbar spondylosis on their problem list. His primarily concern today is the Back Pain (lower); Shoulder Pain (right); and Knee Pain (bilateral)  Pain Assessment: Location: Lower, Right Back Radiating: denies Onset: More than a month ago Duration: Chronic pain Quality: Aching, Discomfort, Constant Severity: 3 /10 (subjective, self-reported pain score)  Note: Reported level is compatible with observation.                          Effect on ADL: "pace self" Timing: Constant Modifying factors: walker, medications BP: (!) 151/95  HR: 100  Lawrence Thornton was last scheduled for an appointment on 12/15/2017 for medication management. During today's appointment we reviewed Lawrence Thornton's chronic pain status, as well as his outpatient medication regimen. He denies any changes in his pain. He  denies any side effects of his medication.   The patient  reports that he does not use drugs. His body mass index is 35.26 kg/m.  Further details on both, my assessment(s), as well as the proposed treatment plan, please see below.  Controlled Substance Pharmacotherapy Assessment REMS (Risk Evaluation and Mitigation Strategy)  Analgesic:Percocet 10 mg2times daily as needed for breakthrough, OxyContin 40 mg twice daily. MME/day:Percocet equals30, OxyContin equals 116m/daytotal equals 150 (reduced from 165) GIgnatius Specking RN  03/15/2018  8:58 AM  Sign at close encounter Nursing Pain Medication Assessment:  Safety precautions to be maintained throughout the outpatient stay will include: orient to surroundings, keep bed in low position, maintain call bell within reach at all times, provide assistance with transfer out of bed and ambulation.  Medication Inspection Compliance: Pill count conducted under aseptic conditions, in front of the patient. Neither the pills nor the bottle was removed from the patient's sight at any time. Once count was completed pills were immediately returned to the patient in their original bottle.  Medication #1: Oxycodone ER (OxyContin) Pill/Patch Count: 21 of 60 pills remain Pill/Patch Appearance: Markings consistent with prescribed medication Bottle Appearance: Standard pharmacy container. Clearly labeled. Filled Date: 11/ 21 / 2019 Last Medication intake:  Today  Medication #2: Oxycodone/APAP Pill/Patch Count: 21 of 90 pills remain Pill/Patch Appearance: Markings consistent with prescribed medication Bottle Appearance: Standard pharmacy container. Clearly labeled. Filled Date: 116/ 21 / 2019 Last Medication intake:  Today   Pharmacokinetics: Liberation and absorption (onset of action): WNL Distribution (time to peak effect): WNL Metabolism and excretion (duration of action): WNL         Pharmacodynamics: Desired effects: Analgesia: Mr.  Thornton  reports >50% benefit. Functional ability: Patient reports that medication allows him to accomplish basic ADLs Clinically meaningful improvement in function (CMIF): Sustained CMIF goals met Perceived effectiveness: Described as relatively effective, allowing for increase in activities of daily living (ADL) Undesirable effects: Side-effects or Adverse reactions: None reported Monitoring:  PMP: Online review of the past 67-monthperiod conducted. Compliant with practice rules and regulations Last UDS on record: Summary  Date Value Ref Range Status  12/15/2017 FINAL  Final    Comment:    ==================================================================== TOXASSURE SELECT 13 (MW) ==================================================================== Test                             Result       Flag       Units Drug Present and Declared for Prescription Verification   Oxycodone                      1403         EXPECTED   ng/mg creat   Oxymorphone                    1402         EXPECTED   ng/mg creat   Noroxycodone                   3345         EXPECTED   ng/mg creat   Noroxymorphone                 844          EXPECTED   ng/mg creat    Sources of oxycodone are scheduled prescription medications.    Oxymorphone, noroxycodone, and noroxymorphone are expected    metabolites of oxycodone. Oxymorphone is also available as a    scheduled prescription medication. ==================================================================== Test                      Result    Flag   Units      Ref Range   Creatinine              119              mg/dL      >=20 ==================================================================== Declared Medications:  The flagging and interpretation on this report are based on the  following declared medications.  Unexpected results may arise from  inaccuracies in the declared medications.  **Note: The testing scope of this panel includes these medications:   Oxycodone  Oxycodone (Oxycodone Acetaminophen)  **Note: The testing scope of this panel does not include following  reported medications:  Acetaminophen (Oxycodone Acetaminophen)  Aspirin (Aspirin 81)  Atorvastatin  Lisinopril  Lubiprostone  Metformin  Omega-3 Fatty Acids  Tizanidine ==================================================================== For clinical consultation, please call [phone] ====================================================================    UDS interpretation: Compliant          Medication Assessment Form: Reviewed. Patient indicates being compliant with therapy Treatment compliance: Compliant Risk Assessment Profile: Aberrant behavior: See prior evaluations. None observed or detected today Comorbid factors increasing risk of overdose: See prior notes. No additional risks detected today Opioid risk tool (ORT) (Total Score): 0 Personal History of Substance Abuse (SUD-Substance use disorder):  Alcohol: Negative  Illegal Drugs: Negative  Rx Drugs:    ORT Risk Level calculation: Low Risk Risk of substance use disorder (SUD): Low Opioid Risk Tool - 03/15/18 02952  Family History of Substance Abuse   Alcohol  Negative    Illegal Drugs  Negative    Rx Drugs  Negative      Personal History of Substance Abuse   Alcohol  Negative    Illegal Drugs  Negative      Age   Age between 64-45 years   No      History of Preadolescent Sexual Abuse   History of Preadolescent Sexual Abuse  Negative or Male      Psychological Disease   Psychological Disease  Negative    Depression  Negative      Total Score   Opioid Risk Tool Scoring  0    Opioid Risk Interpretation  Low Risk      ORT Scoring interpretation table:  Score <3 = Low Risk for SUD  Score between 4-7 = Moderate Risk for SUD  Score >8 = High Risk for Opioid Abuse   Risk Mitigation Strategies:  Patient Counseling: Covered Patient-Prescriber Agreement (PPA): Present and active   Notification to other healthcare providers: Done  Pharmacologic Plan: No change in therapy, at this time.             Laboratory Chemistry  Inflammation Markers (CRP: Acute Phase) (ESR: Chronic Phase) No results found for: CRP, ESRSEDRATE, LATICACIDVEN                       Rheumatology Markers No results found for: RF, ANA, LABURIC, URICUR, LYMEIGGIGMAB, LYMEABIGMQN, HLAB27                      Renal Function Markers Lab Results  Component Value Date   BUN 13 08/13/2017   CREATININE 0.86 62/95/2841   BCR NOT APPLICABLE 32/44/0102   GFRAA 108 08/13/2017   GFRNONAA 94 08/13/2017                             Hepatic Function Markers Lab Results  Component Value Date   AST 23 08/13/2017   ALT 26 08/13/2017   ALBUMIN 4.6 11/11/2015   ALKPHOS 76 11/11/2015   HCVAB NEGATIVE 10/06/2016                        Electrolytes Lab Results  Component Value Date   NA 138 08/13/2017   K 4.6 08/13/2017   CL 102 08/13/2017   CALCIUM 9.7 08/13/2017                        Neuropathy Markers Lab Results  Component Value Date   HGBA1C 6.0 (A) 11/15/2017                        CNS Tests No results found for: COLORCSF, APPEARCSF, RBCCOUNTCSF, WBCCSF, POLYSCSF, LYMPHSCSF, EOSCSF, PROTEINCSF, GLUCCSF, JCVIRUS, CSFOLI, IGGCSF                      Bone Pathology Markers Lab Results  Component Value Date   VD25OH 43 10/06/2016                         Coagulation Parameters Lab Results  Component Value Date   INR 1.07 12/13/2014   LABPROT 14.1 12/13/2014   APTT 34 12/13/2014   PLT 274 08/13/2017  Cardiovascular Markers Lab Results  Component Value Date   HGB 13.9 08/13/2017   HCT 40.0 08/13/2017                         CA Markers No results found for: CEA, CA125, LABCA2                      Note: Lab results reviewed.  Recent Diagnostic Imaging Results   Complexity Note: Imaging results reviewed. Results shared with Lawrence Thornton, using Layman's  terms.                         Meds   Current Outpatient Medications:  .  aspirin EC 81 MG tablet, Take 1 tablet (81 mg total) by mouth daily., Disp: 30 tablet, Rfl: 0 .  atorvastatin (LIPITOR) 20 MG tablet, Take 1 tablet (20 mg total) by mouth daily., Disp: 90 tablet, Rfl: 1 .  lisinopril (PRINIVIL,ZESTRIL) 40 MG tablet, Take 1 tablet (40 mg total) by mouth at bedtime., Disp: 90 tablet, Rfl: 1 .  lubiprostone (AMITIZA) 24 MCG capsule, Take 1 capsule (24 mcg total) by mouth 2 (two) times daily with a meal., Disp: 180 capsule, Rfl: 1 .  metFORMIN (GLUCOPHAGE-XR) 750 MG 24 hr tablet, Take 2 tablets (1,500 mg total) by mouth daily with breakfast., Disp: 180 tablet, Rfl: 1 .  omega-3 acid ethyl esters (LOVAZA) 1 g capsule, Take 2 capsules (2 g total) by mouth 2 (two) times daily., Disp: 360 capsule, Rfl: 1 .  tamsulosin (FLOMAX) 0.4 MG CAPS capsule, Take 1 capsule (0.4 mg total) by mouth daily., Disp: 90 capsule, Rfl: 1 .  [START ON 05/25/2018] oxyCODONE (OXYCONTIN) 40 mg 12 hr tablet, Take 1 tablet (40 mg total) by mouth every 12 (twelve) hours., Disp: 60 tablet, Rfl: 0 .  [START ON 04/25/2018] oxyCODONE (OXYCONTIN) 40 mg 12 hr tablet, Take 1 tablet (40 mg total) by mouth every 12 (twelve) hours., Disp: 60 tablet, Rfl: 0 .  [START ON 03/26/2018] oxyCODONE (OXYCONTIN) 40 mg 12 hr tablet, Take 1 tablet (40 mg total) by mouth every 12 (twelve) hours., Disp: 60 tablet, Rfl: 0 .  [START ON 05/25/2018] oxyCODONE-acetaminophen (PERCOCET) 10-325 MG tablet, Take 1 tablet by mouth 2 (two) times daily as needed for pain., Disp: 60 tablet, Rfl: 0 .  [START ON 04/25/2018] oxyCODONE-acetaminophen (PERCOCET) 10-325 MG tablet, Take 1 tablet by mouth 2 (two) times daily., Disp: 60 tablet, Rfl: 0 .  [START ON 03/26/2018] oxyCODONE-acetaminophen (PERCOCET) 10-325 MG tablet, Take 1 tablet by mouth 2 (two) times daily., Disp: 60 tablet, Rfl: 0 .  [START ON 03/26/2018] tiZANidine (ZANAFLEX) 4 MG tablet, Take 1 tablet (4 mg  total) by mouth at bedtime., Disp: 30 tablet, Rfl: 2  ROS  Constitutional: Denies any fever or chills Gastrointestinal: No reported hemesis, hematochezia, vomiting, or acute GI distress Musculoskeletal: Denies any acute onset joint swelling, redness, loss of ROM, or weakness Neurological: No reported episodes of acute onset apraxia, aphasia, dysarthria, agnosia, amnesia, paralysis, loss of coordination, or loss of consciousness  Allergies  Lawrence Thornton has No Known Allergies.  [place]  Drug: Lawrence Thornton  reports that he does not use drugs. Alcohol:  reports that he does not drink alcohol. Tobacco:  reports that he quit smoking about 18 years ago. He has never used smokeless tobacco. Medical:  has a past medical history of Arthritis and Hypertension. Surgical: Lawrence Thornton  has a  past surgical history that includes Back surgery (lower back surgery x 3 ); Appendectomy (age 59); sebaceous cyst removed (Right, 15 years ago); Toe Fusion (Right); metal removed from eye (years ago); Total knee arthroplasty (Left, 08/10/2014); Total knee arthroplasty (Right, 12/21/2014); Total knee arthroplasty (Right, 12/21/14); Lumbar fusion (1999); Colonoscopy with propofol (N/A, 05/04/2017); and arthroscopic shoulder surgery (Right, 08/31/2017). Family: family history includes Cancer in his mother; Heart attack in his mother; Hyperlipidemia in his father; Hypertension in his father.  Constitutional Exam  General appearance: Well nourished, well developed, and well hydrated. In no apparent acute distress Vitals:   03/15/18 0837  BP: (!) 151/95  Pulse: 100  Resp: 16  Temp: 97.9 F (36.6 C)  SpO2: 100%  Weight: 260 lb (117.9 kg)  Height: 6' (1.829 m)   Psych/Mental status: Alert, oriented x 3 (person, place, & time)       Eyes: PERLA Respiratory: No evidence of acute respiratory distress  Lumbar Spine Area Exam  Skin & Axial Inspection: No masses, redness, or swelling Alignment: Symmetrical Functional ROM:  Unrestricted ROM       Stability: No instability detected Muscle Tone/Strength: Functionally intact. No obvious neuro-muscular anomalies detected. Sensory (Neurological): Unimpaired Palpation: No palpable anomalies        Gait & Posture Assessment  Ambulation: Unassisted Gait: Relatively normal for age and body habitus Posture: Antalgic   Lower Extremity Exam    Side: Right lower extremity  Side: Left lower extremity  Stability: No instability observed          Stability: No instability observed          Skin & Extremity Inspection: Skin color, temperature, and hair growth are WNL. No peripheral edema or cyanosis. No masses, redness, swelling, asymmetry, or associated skin lesions. No contractures.  Skin & Extremity Inspection: Skin color, temperature, and hair growth are WNL. No peripheral edema or cyanosis. No masses, redness, swelling, asymmetry, or associated skin lesions. No contractures.  Functional ROM: Unrestricted ROM                  Functional ROM: Unrestricted ROM                  Muscle Tone/Strength: Functionally intact. No obvious neuro-muscular anomalies detected.  Muscle Tone/Strength: Functionally intact. No obvious neuro-muscular anomalies detected.  Sensory (Neurological): Unimpaired        Sensory (Neurological): Unimpaired            Palpation: No palpable anomalies  Palpation: No palpable anomalies   Assessment  Primary Diagnosis & Pertinent Problem List: The primary encounter diagnosis was Lumbar spondylosis. Diagnoses of Chronic right-sided low back pain without sciatica, Spasm of back muscles, Chronic pain syndrome, and Long term prescription opiate use were also pertinent to this visit.  Status Diagnosis  Controlled Controlled Controlled 1. Lumbar spondylosis   2. Chronic right-sided low back pain without sciatica   3. Spasm of back muscles   4. Chronic pain syndrome   5. Long term prescription opiate use     Problems updated and reviewed during this  visit: Problem  Lumbar Spondylosis   Plan of Care  Pharmacotherapy (Medications Ordered): Meds ordered this encounter  Medications  . oxyCODONE (OXYCONTIN) 40 mg 12 hr tablet    Sig: Take 1 tablet (40 mg total) by mouth every 12 (twelve) hours.    Dispense:  60 tablet    Refill:  0    Do not place this medication, or any other prescription from our  practice, on "Automatic Refill". Patient may have prescription filled one day early if pharmacy is closed on scheduled refill date.    Order Specific Question:   Supervising Provider    Answer:   Milinda Pointer [phone]  . oxyCODONE-acetaminophen (PERCOCET) 10-325 MG tablet    Sig: Take 1 tablet by mouth 2 (two) times daily as needed for pain.    Dispense:  60 tablet    Refill:  0    Order Specific Question:   Supervising Provider    Answer:   Milinda Pointer [phone]  . oxyCODONE (OXYCONTIN) 40 mg 12 hr tablet    Sig: Take 1 tablet (40 mg total) by mouth every 12 (twelve) hours.    Dispense:  60 tablet    Refill:  0    Do not place this medication on "Automatic Refill". Patient may have prescription filled one day early if pharmacy is closed on scheduled refill date.    Order Specific Question:   Supervising Provider    Answer:   Milinda Pointer [phone]  . tiZANidine (ZANAFLEX) 4 MG tablet    Sig: Take 1 tablet (4 mg total) by mouth at bedtime.    Dispense:  30 tablet    Refill:  2    Order Specific Question:   Supervising Provider    Answer:   Milinda Pointer [phone]  . oxyCODONE (OXYCONTIN) 40 mg 12 hr tablet    Sig: Take 1 tablet (40 mg total) by mouth every 12 (twelve) hours.    Dispense:  60 tablet    Refill:  0    Do not place this medication on "Automatic Refill". Patient may have prescription filled one day early if pharmacy is closed on scheduled refill date.    Order Specific Question:   Supervising Provider    Answer:   Milinda Pointer [phone]  . oxyCODONE-acetaminophen (PERCOCET) 10-325 MG tablet     Sig: Take 1 tablet by mouth 2 (two) times daily.    Dispense:  60 tablet    Refill:  0    Do not place this medication on "Automatic Refill". Patient may have prescription filled one day early if pharmacy is closed on scheduled refill date.    Order Specific Question:   Supervising Provider    Answer:   Milinda Pointer [phone]  . oxyCODONE-acetaminophen (PERCOCET) 10-325 MG tablet    Sig: Take 1 tablet by mouth 2 (two) times daily.    Dispense:  60 tablet    Refill:  0    Do not place this medication on "Automatic Refill". Patient may have prescription filled one day early if pharmacy is closed on scheduled refill date.    Order Specific Question:   Supervising Provider    Answer:   Milinda Pointer [[id number]]   New Prescriptions   No medications on file   Medications administered today: Aedon Deason. Huey Romans. had no medications administered during this visit. Lab-work, procedure(s), and/or referral(s): Orders Placed This Encounter  Procedures  . ToxASSURE Select 13 (MW), Urine   Imaging and/or referral(s): None  Interventional therapies: Planned, scheduled, and/or pending:   Not at this time.    Provider-requested follow-up: Return in about 3 months (around 06/14/2018) for MedMgmt.  Future Appointments  Date Time Provider [place]  03/18/2018  7:40 AM Steele Sizer, MD [place] [facility]  06/13/2018  8:45 AM Vevelyn Francois, NP [facility] None   Primary Care Physician: Steele Sizer, MD Location[facility] Note by: Vevelyn Francois  NP Date: 03/15/2018; Time: 9:13 AM  Pain Score Disclaimer: We use the NRS-11 scale. This is a self-reported, subjective measurement of pain severity with only modest accuracy. It is used primarily to identify changes within a particular patient. It must be understood that outpatient pain scales are significantly less accurate that those used for research, where they can be applied under ideal controlled  circumstances with minimal exposure to variables. In reality, the score is likely to be a combination of pain intensity and pain affect, where pain affect describes the degree of emotional arousal or changes in action readiness caused by the sensory experience of pain. Factors such as social and work situation, setting, emotional state, anxiety levels, expectation, and prior pain experience may influence pain perception and show large inter-individual differences that may also be affected by time variables.  Patient instructions provided during this appointment: Patient Instructions   ____________________________________________________________________________________________  Medication Rules  Purpose: To inform patients, and their family members, of our rules and regulations.  Applies to: All patients receiving prescriptions (written or electronic).  Pharmacy of record: Pharmacy where electronic prescriptions will be sent. If written prescriptions are taken to a different pharmacy, please inform the nursing staff. The pharmacy listed in the electronic medical record should be the one where you would like electronic prescriptions to be sent.  Electronic prescriptions: In compliance with the [place] (STOP) Act of 2017 (Session Lanny Cramp [phone]), effective April 06, 2018, all controlled substances must be electronically prescribed. Calling prescriptions to the pharmacy will cease to exist.  Prescription refills: Only during scheduled appointments. Applies to all prescriptions.  NOTE: The following applies primarily to controlled substances (Opioid* Pain Medications).   Patient's responsibilities: 1. Pain Pills: Bring all pain pills to every appointment (except for procedure appointments). 2. Pill Bottles: Bring pills in original pharmacy bottle. Always bring the newest bottle. Bring bottle, even if empty. 3. Medication refills: You are responsible  for knowing and keeping track of what medications you take and those you need refilled. The day before your appointment: write a list of all prescriptions that need to be refilled. The day of the appointment: give the list to the admitting nurse. Prescriptions will be written only during appointments. If you forget a medication: it will not be "Called in", "Faxed", or "electronically sent". You will need to get another appointment to get these prescribed. No early refills. Do not call asking to have your prescription filled early. 4. Prescription Accuracy: You are responsible for carefully inspecting your prescriptions before leaving our office. Have the discharge nurse carefully go over each prescription with you, before taking them home. Make sure that your name is accurately spelled, that your address is correct. Check the name and dose of your medication to make sure it is accurate. Check the number of pills, and the written instructions to make sure they are clear and accurate. Make sure that you are given enough medication to last until your next medication refill appointment. 5. Taking Medication: Take medication as prescribed. When it comes to controlled substances, taking less pills or less frequently than prescribed is permitted and encouraged. Never take more pills than instructed. Never take medication more frequently than prescribed.  6. Inform other Doctors: Always inform, all of your healthcare providers, of all the medications you take. 7. Pain Medication from other Providers: You are not allowed to accept any additional pain medication from any other Doctor or Healthcare provider. There are two exceptions to this rule. (see  below) In the event that you require additional pain medication, you are responsible for notifying us, as stated below. 8. Medication Agreement: You are responsible for carefully reading and following our Medication Agreement. This must be signed before receiving any  prescriptions from our practice. Safely store a copy of your signed Agreement. Violations to the Agreement will result in no further prescriptions. (Additional copies of our Medication Agreement are available upon request.) 9. Laws, Rules, & Regulations: All patients are expected to follow all Federal and [employer], [employer], Rules, [employer]. Ignorance of the Laws does not constitute a valid excuse. The use of any illegal substances is prohibited. 10. Adopted CDC guidelines & recommendations: Target dosing levels will be at or below 60 MME/day. Use of benzodiazepines** is not recommended.  Exceptions: There are only two exceptions to the rule of not receiving pain medications from other Healthcare Providers. 1. Exception #1 (Emergencies): In the event of an emergency (i.e.: accident requiring emergency care), you are allowed to receive additional pain medication. However, you are responsible for: As soon as you are able, call our office [phone], at any time of the day or night, and leave a message stating your name, the date and nature of the emergency, and the name and dose of the medication prescribed. In the event that your call is answered by a member of our staff, make sure to document and save the date, time, and the name of the person that took your information.  2. Exception #2 (Planned Surgery): In the event that you are scheduled by another doctor or dentist to have any type of surgery or procedure, you are allowed (for a period no longer than 30 days), to receive additional pain medication, for the acute post-op pain. However, in this case, you are responsible for picking up a copy of our "Post-op Pain Management for Surgeons" handout, and giving it to your surgeon or dentist. This document is available at our office, and does not require an appointment to obtain it. Simply go to our office during business hours (Monday-Thursday from 8:00 AM to 4:00 PM) (Friday 8:00 AM to 12:00 Noon) or  if you have a scheduled appointment with [place], prior to your surgery, and ask for it by name. In addition, you will need to provide [place] with your name, name of your surgeon, type of surgery, and date of procedure or surgery.  *Opioid medications include: morphine, codeine, oxycodone, oxymorphone, hydrocodone, hydromorphone, meperidine, tramadol, tapentadol, buprenorphine, fentanyl, methadone. **Benzodiazepine medications include: diazepam (Valium), alprazolam (Xanax), clonazepam (Klonopine), lorazepam (Ativan), clorazepate (Tranxene), chlordiazepoxide (Librium), estazolam (Prosom), oxazepam (Serax), temazepam (Restoril), triazolam (Halcion) (Last updated: 06/03/2017) ____________________________________________________________________________________________  BMI Assessment: Estimated body mass index is 36.62 kg/m as calculated from the following:   Height as of 12/15/17: 6' (1.829 m).   Weight as of 12/15/17: 270 lb (122.5 kg).  BMI interpretation table: BMI level Category Range association with higher incidence of chronic pain  <18 kg/m2 Underweight   18.5-24.9 kg/m2 Ideal body weight   25-29.9 kg/m2 Overweight Increased incidence by 20%  30-34.9 kg/m2 Obese (Class I) Increased incidence by 68%  35-39.9 kg/m2 Severe obesity (Class II) Increased incidence by 136%  >40 kg/m2 Extreme obesity (Class III) Increased incidence by 254%   Patient's current BMI Ideal Body weight  There is no height or weight on file to calculate BMI. Patient weight not recorded   BMI Readings from Last 4 Encounters:  12/15/17 36.62 kg/m  11/15/17 36.86 kg/m  09/14/17 37.43 kg/m  08/13/17 38.79 kg/m   Wt Readings from Last 4 Encounters:  12/15/17 270 lb (122.5 kg)  11/15/17 271 lb 12.8 oz (123.3 kg)  09/14/17 276 lb (125.2 kg)  08/13/17 286 lb (129.7 kg)

## 2018-03-15 NOTE — Progress Notes (Signed)
Nursing Pain Medication Assessment:  Safety precautions to be maintained throughout the outpatient stay will include: orient to surroundings, keep bed in low position, maintain call bell within reach at all times, provide assistance with transfer out of bed and ambulation.  Medication Inspection Compliance: Pill count conducted under aseptic conditions, in front of the patient. Neither the pills nor the bottle was removed from the patient's sight at any time. Once count was completed pills were immediately returned to the patient in their original bottle.  Medication #1: Oxycodone ER (OxyContin) Pill/Patch Count: 21 of 60 pills remain Pill/Patch Appearance: Markings consistent with prescribed medication Bottle Appearance: Standard pharmacy container. Clearly labeled. Filled Date: 11/ 21 / 2019 Last Medication intake:  Today  Medication #2: Oxycodone/APAP Pill/Patch Count: 21 of 90 pills remain Pill/Patch Appearance: Markings consistent with prescribed medication Bottle Appearance: Standard pharmacy container. Clearly labeled. Filled Date: 7311 / 21 / 2019 Last Medication intake:  Today

## 2018-03-15 NOTE — Patient Instructions (Addendum)
____________________________________________________________________________________________  Medication Rules  Purpose: To inform patients, and their family members, of our rules and regulations.  Applies to: All patients receiving prescriptions (written or electronic).  Pharmacy of record: Pharmacy where electronic prescriptions will be sent. If written prescriptions are taken to a different pharmacy, please inform the nursing staff. The pharmacy listed in the electronic medical record should be the one where you would like electronic prescriptions to be sent.  Electronic prescriptions: In compliance with the [place] Strengthen Opioid Misuse Prevention (STOP) Act of 2017 (Session Law 2017-74/H243), effective April 06, 2018, all controlled substances must be electronically prescribed. Calling prescriptions to the pharmacy will cease to exist.  Prescription refills: Only during scheduled appointments. Applies to all prescriptions.  NOTE: The following applies primarily to controlled substances (Opioid* Pain Medications).   Patient's responsibilities: 1. Pain Pills: Bring all pain pills to every appointment (except for procedure appointments). 2. Pill Bottles: Bring pills in original pharmacy bottle. Always bring the newest bottle. Bring bottle, even if empty. 3. Medication refills: You are responsible for knowing and keeping track of what medications you take and those you need refilled. The day before your appointment: write a list of all prescriptions that need to be refilled. The day of the appointment: give the list to the admitting nurse. Prescriptions will be written only during appointments. If you forget a medication: it will not be "Called in", "Faxed", or "electronically sent". You will need to get another appointment to get these prescribed. No early refills. Do not call asking to have your prescription filled early. 4. Prescription Accuracy: You are responsible for  carefully inspecting your prescriptions before leaving our office. Have the discharge nurse carefully go over each prescription with you, before taking them home. Make sure that your name is accurately spelled, that your address is correct. Check the name and dose of your medication to make sure it is accurate. Check the number of pills, and the written instructions to make sure they are clear and accurate. Make sure that you are given enough medication to last until your next medication refill appointment. 5. Taking Medication: Take medication as prescribed. When it comes to controlled substances, taking less pills or less frequently than prescribed is permitted and encouraged. Never take more pills than instructed. Never take medication more frequently than prescribed.  6. Inform other Doctors: Always inform, all of your healthcare providers, of all the medications you take. 7. Pain Medication from other Providers: You are not allowed to accept any additional pain medication from any other Doctor or Healthcare provider. There are two exceptions to this rule. (see below) In the event that you require additional pain medication, you are responsible for notifying us, as stated below. 8. Medication Agreement: You are responsible for carefully reading and following our Medication Agreement. This must be signed before receiving any prescriptions from our practice. Safely store a copy of your signed Agreement. Violations to the Agreement will result in no further prescriptions. (Additional copies of our Medication Agreement are available upon request.) 9. Laws, Rules, & Regulations: All patients are expected to follow all Federal and State Laws, Statutes, Rules, & Regulations. Ignorance of the Laws does not constitute a valid excuse. The use of any illegal substances is prohibited. 10. Adopted CDC guidelines & recommendations: Target dosing levels will be at or below 60 MME/day. Use of benzodiazepines** is not  recommended.  Exceptions: There are only two exceptions to the rule of not receiving pain medications from other Healthcare Providers. 1.   Exception #1 (Emergencies): In the event of an emergency (i.e.: accident requiring emergency care), you are allowed to receive additional pain medication. However, you are responsible for: As soon as you are able, call our office [phone], at any time of the day or night, and leave a message stating your name, the date and nature of the emergency, and the name and dose of the medication prescribed. In the event that your call is answered by a member of our staff, make sure to document and save the date, time, and the name of the person that took your information.  2. Exception #2 (Planned Surgery): In the event that you are scheduled by another doctor or dentist to have any type of surgery or procedure, you are allowed (for a period no longer than 30 days), to receive additional pain medication, for the acute post-op pain. However, in this case, you are responsible for picking up a copy of our "Post-op Pain Management for Surgeons" handout, and giving it to your surgeon or dentist. This document is available at our office, and does not require an appointment to obtain it. Simply go to our office during business hours (Monday-Thursday from 8:00 AM to 4:00 PM) (Friday 8:00 AM to 12:00 Noon) or if you have a scheduled appointment with us, prior to your surgery, and ask for it by name. In addition, you will need to provide us with your name, name of your surgeon, type of surgery, and date of procedure or surgery.  *Opioid medications include: morphine, codeine, oxycodone, oxymorphone, hydrocodone, hydromorphone, meperidine, tramadol, tapentadol, buprenorphine, fentanyl, methadone. **Benzodiazepine medications include: diazepam (Valium), alprazolam (Xanax), clonazepam (Klonopine), lorazepam (Ativan), clorazepate (Tranxene), chlordiazepoxide (Librium), estazolam (Prosom),  oxazepam (Serax), temazepam (Restoril), triazolam (Halcion) (Last updated: 06/03/2017) ____________________________________________________________________________________________  BMI Assessment: Estimated body mass index is 36.62 kg/m as calculated from the following:   Height as of 12/15/17: 6' (1.829 m).   Weight as of 12/15/17: 270 lb (122.5 kg).  BMI interpretation table: BMI level Category Range association with higher incidence of chronic pain  <18 kg/m2 Underweight   18.5-24.9 kg/m2 Ideal body weight   25-29.9 kg/m2 Overweight Increased incidence by 20%  30-34.9 kg/m2 Obese (Class I) Increased incidence by 68%  35-39.9 kg/m2 Severe obesity (Class II) Increased incidence by 136%  >40 kg/m2 Extreme obesity (Class III) Increased incidence by 254%   Patient's current BMI Ideal Body weight  There is no height or weight on file to calculate BMI. Patient weight not recorded   BMI Readings from Last 4 Encounters:  12/15/17 36.62 kg/m  11/15/17 36.86 kg/m  09/14/17 37.43 kg/m  08/13/17 38.79 kg/m   Wt Readings from Last 4 Encounters:  12/15/17 270 lb (122.5 kg)  11/15/17 271 lb 12.8 oz (123.3 kg)  09/14/17 276 lb (125.2 kg)  08/13/17 286 lb (129.7 kg)

## 2018-03-18 ENCOUNTER — Ambulatory Visit (INDEPENDENT_AMBULATORY_CARE_PROVIDER_SITE_OTHER): Payer: BLUE CROSS/BLUE SHIELD | Admitting: Family Medicine

## 2018-03-18 ENCOUNTER — Encounter: Payer: Self-pay | Admitting: Family Medicine

## 2018-03-18 VITALS — BP 150/85 | HR 76 | Temp 98.1°F | Resp 14 | Ht 72.0 in | Wt 262.5 lb

## 2018-03-18 DIAGNOSIS — E785 Hyperlipidemia, unspecified: Secondary | ICD-10-CM

## 2018-03-18 DIAGNOSIS — T402X5A Adverse effect of other opioids, initial encounter: Secondary | ICD-10-CM

## 2018-03-18 DIAGNOSIS — G894 Chronic pain syndrome: Secondary | ICD-10-CM

## 2018-03-18 DIAGNOSIS — K5903 Drug induced constipation: Secondary | ICD-10-CM

## 2018-03-18 DIAGNOSIS — F1120 Opioid dependence, uncomplicated: Secondary | ICD-10-CM

## 2018-03-18 DIAGNOSIS — I10 Essential (primary) hypertension: Secondary | ICD-10-CM | POA: Diagnosis not present

## 2018-03-18 DIAGNOSIS — E1169 Type 2 diabetes mellitus with other specified complication: Secondary | ICD-10-CM

## 2018-03-18 LAB — POCT GLYCOSYLATED HEMOGLOBIN (HGB A1C): HEMOGLOBIN A1C: 5.9 % — AB (ref 4.0–5.6)

## 2018-03-18 MED ORDER — LINACLOTIDE 145 MCG PO CAPS: 145.0000 ug | ORAL_CAPSULE | Freq: Every day | ORAL | 1 refills | Status: DC

## 2018-03-18 NOTE — Progress Notes (Signed)
Name: Lawrence Thornton.   MRN: [id number]    DOB: [birth year]   Date:03/18/2018       Progress Note  Subjective  Chief Complaint  Chief Complaint  Patient presents with  . Follow-up    4 mth f/u     HPI  DMII: diagnosed May 2019 with a hgb 6.9% He denies polydipsia ( but states likes water), no polyphagia, has nocturia from BPH, not every night. Discussed yearly eye exam and he will schedule it, urine micro was negative . Denies blurred vision. He has high triglycerides and he was  gemfibrozil, but we switched from gemfibrozil to Lovaza in May since he has been taking atorvastatin and denies any side effects of medications, last LDL and triglycerides were at goal.. He has been eating better and avoiding sweets, only drinking sweet tea at lunch, walking daily with dog and has lost 9 lbs since last visit   Chronic pain and opioid use: he was given Linzess but too expensive, we switched to Amitiza, but he states price is about the same and he wants to go back on Linzess today.  He states bowel movements are now daily, but states needs to take medication occasionally to help him have a bowel movement.  Morbid obesity: he has been eating a better breakfast, avoiding ice cream , drinking less sweet tea, and has lost 9 lbs since last visit. He has multiple co-morbidities: DM, HTN, hyperlipidemia  BPH: taking cardura but states not helping, he is now on Flomax and states it has been working better, still has nocturia but max once to twice  HTN:he ran out of all medications about 5 days ago and bp is elevated, he states without medication bp has been 150's/80's, he will resume it today, going by the pharmacy to pick up prescription. He denies chest pain or palpitation    Patient Active Problem List   Diagnosis Date Noted  . Lumbar spondylosis 03/15/2018  . Long term current use of opiate analgesic 12/15/2017  . Dyslipidemia associated with type 2 diabetes mellitus (HCC) 08/13/2017  .  Osteoarthritis of acromioclavicular joint 07/30/2017  . S/P lumbar fusion 07/13/2017  . Chronic pain syndrome 07/13/2017  . Personal history of colonic polyps   . Constipation due to opioid therapy 07/18/2015  . Hypertriglyceridemia 01/22/2015  . Extreme obesity 01/22/2015  . S/P knee replacement 12/21/2014  . Chronic low back pain 09/21/2014  . BPH associated with nocturia 09/21/2014  . Dyslipidemia 09/21/2014  . Continuous opioid dependence (HCC) 09/21/2014  . DDD (degenerative disc disease), lumbar 09/21/2014  . Failure of erection 09/21/2014    Past Surgical History:  Procedure Laterality Date  . APPENDECTOMY  age 9  . arthroscopic shoulder surgery Right 08/31/2017  . BACK SURGERY  lower back surgery x 3    L 3 L 4 and L 5 are fused together  . COLONOSCOPY WITH PROPOFOL N/A 05/04/2017   Procedure: COLONOSCOPY WITH PROPOFOL;  Surgeon: Midge Minium, MD;  Location: [facility] ENDOSCOPY;  Service: Endoscopy;  Laterality: N/A;  . LUMBAR FUSION  1999   L- to L5  . metal removed from eye  years ago  . sebaceous cyst removed Right 15 years ago   middle  finger   . TOE FUSION Right    4th toe  . 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 TOTAL KNEE ARTHROPLASTY Right  12/21/2014   Procedure: RIGHT TOTAL KNEE ARTHROPLASTY;  Surgeon: Eugenia Mcalpine, MD;  Location: [facility] ORS;  Service: Orthopedics;  Laterality: Right;  . TOTAL KNEE ARTHROPLASTY Right 12/21/14    Family History  Problem Relation Age of Onset  . Cancer Mother        Throat  . Heart attack Mother   . Hypertension Father   . Hyperlipidemia Father     Social History   Socioeconomic History  . Marital status: Married    Spouse name: Not on file  . Number of children: 0  . Years of education: Not on file  . Highest education level: Not on file  Occupational History  . Occupation: mill write  work   Engineer, production  . Financial resource strain: Somewhat hard  . Food insecurity:    Worry: Never true    Inability: Never true  . Transportation needs:    Medical: No    Non-medical: No  Tobacco Use  . Smoking status: Former Smoker    Packs/day: 1.50    Years: 20.00    Pack years: 30.00    Types: Cigarettes    Last attempt to quit: 04/07/1999    Years since quitting: 18.9  . Smokeless tobacco: Never Used  . Tobacco comment: not smoking   Substance and Sexual Activity  . Alcohol use: No  . Drug use: No  . Sexual activity: Yes    Birth control/protection: Condom  Lifestyle  . Physical activity:    Days per week: 7 days    Minutes per session: 30 min  . Stress: To some extent  Relationships  . Social connections:    Talks on phone: Not on file    Gets together: Not on file    Attends religious service: Not on file    Active member of club or organization: Not on file    Attends meetings of clubs or organizations: Not on file    Relationship status: Not on file  . Intimate partner violence:    Fear of current or ex partner: Not on file    Emotionally abused: Not on file    Physically abused: Not on file    Forced sexual activity: Not on file  Other Topics Concern  . Not on file  Social History Narrative  . Not on file     Current Outpatient Medications:  .  aspirin EC 81 MG tablet, Take 1 tablet (81 mg total) by mouth daily., Disp: 30 tablet, Rfl: 0 .  atorvastatin (LIPITOR) 20 MG tablet, Take 1 tablet (20 mg total) by mouth daily., Disp: 90 tablet, Rfl: 1 .  linaclotide (LINZESS) 145 MCG CAPS capsule, Take 1 capsule (145 mcg total) by mouth daily before breakfast., Disp: 90 capsule, Rfl: 1 .  lisinopril (PRINIVIL,ZESTRIL) 40 MG tablet, Take 1 tablet (40 mg total) by mouth at bedtime., Disp: 90 tablet, Rfl: 1 .  metFORMIN (GLUCOPHAGE-XR) 750 MG 24 hr tablet, Take 2 tablets (1,500 mg total) by mouth daily with breakfast., Disp: 180 tablet, Rfl: 1 .  omega-3 acid  ethyl esters (LOVAZA) 1 g capsule, Take 2 capsules (2 g total) by mouth 2 (two) times daily., Disp: 360 capsule, Rfl: 1 .  [START ON 05/25/2018] oxyCODONE (OXYCONTIN) 40 mg 12 hr tablet, Take 1 tablet (40 mg total) by mouth every 12 (twelve) hours., Disp: 60 tablet, Rfl: 0 .  [START ON 04/25/2018] oxyCODONE (OXYCONTIN) 40 mg 12 hr tablet, Take 1 tablet (40 mg total) by mouth every 12 (  twelve) hours., Disp: 60 tablet, Rfl: 0 .  [START ON 03/26/2018] oxyCODONE (OXYCONTIN) 40 mg 12 hr tablet, Take 1 tablet (40 mg total) by mouth every 12 (twelve) hours., Disp: 60 tablet, Rfl: 0 .  [START ON 05/25/2018] oxyCODONE-acetaminophen (PERCOCET) 10-325 MG tablet, Take 1 tablet by mouth 2 (two) times daily as needed for pain., Disp: 60 tablet, Rfl: 0 .  [START ON 04/25/2018] oxyCODONE-acetaminophen (PERCOCET) 10-325 MG tablet, Take 1 tablet by mouth 2 (two) times daily., Disp: 60 tablet, Rfl: 0 .  [START ON 03/26/2018] oxyCODONE-acetaminophen (PERCOCET) 10-325 MG tablet, Take 1 tablet by mouth 2 (two) times daily., Disp: 60 tablet, Rfl: 0 .  tamsulosin (FLOMAX) 0.4 MG CAPS capsule, Take 1 capsule (0.4 mg total) by mouth daily., Disp: 90 capsule, Rfl: 1 .  [START ON 03/26/2018] tiZANidine (ZANAFLEX) 4 MG tablet, Take 1 tablet (4 mg total) by mouth at bedtime., Disp: 30 tablet, Rfl: 2  No Known Allergies  I personally reviewed active problem list, medication list, allergies, family history, social history with the patient/caregiver today.   ROS  Constitutional: Negative for fever, positive for  weight change.  Respiratory: Negative for cough and shortness of breath.   Cardiovascular: Negative for chest pain or palpitations.  Gastrointestinal: Negative for abdominal pain, no bowel changes.  Musculoskeletal: Negative for gait problem or joint swelling.  Skin: Negative for rash.  Neurological: Negative for dizziness or headache.  No other specific complaints in a complete review of systems (except as listed in HPI  above).  Objective  Vitals:   03/18/18 0754  BP: (!) 150/85  Pulse: 76  Resp: 14  Temp: 98.1 F (36.7 C)  SpO2: 99%  Weight: 262 lb 8 oz (119.1 kg)  Height: 6' (1.829 m)    Body mass index is 35.6 kg/m.  Physical Exam  Constitutional: Patient appears well-developed and well-nourished. Obese  No distress.  HEENT: head atraumatic, normocephalic, pupils equal and reactive to light,neck supple, throat within normal limits Cardiovascular: Normal rate, regular rhythm and normal heart sounds.  No murmur heard. No BLE edema. Pulmonary/Chest: Effort normal and breath sounds normal. No respiratory distress. Abdominal: Soft.  There is no tenderness. Muscular Skeletal: pain during palpation of lumbar spine , negative straight leg raise Psychiatric: Patient has a normal mood and affect. behavior is normal. Judgment and thought content normal.  PHQ2/9: [facility] 03/18/2018 11/15/2017 09/14/2017 07/13/2017 05/13/2017  Decreased Interest 0 0 0 0 0  Down, Depressed, Hopeless 0 0 0 0 0  PHQ - 2 Score 0 0 0 0 0  Altered sleeping 0 0 - - -  Tired, decreased energy 0 0 - - -  Change in appetite 0 0 - - -  Feeling bad or failure about yourself  0 0 - - -  Trouble concentrating 0 0 - - -  Moving slowly or fidgety/restless 0 0 - - -  Suicidal thoughts 0 0 - - -  PHQ-9 Score 0 0 - - -  Difficult doing work/chores Not difficult at all Not difficult at all - - -     Fall Risk: Fall Risk  03/18/2018 03/15/2018 12/15/2017 11/15/2017 09/14/2017  Falls in the past year? 0 0 No No No  Number falls in past yr: 0 0 - - -  Injury with Fall? 0 0 - - -    Assessment & Plan  1. Essential hypertension  He ran out of medication, bp is elevated   2. Dyslipidemia associated with type 2 diabetes  mellitus (HCC)  - POCT glycosylated hemoglobin (Hb A1C)  3. Morbid obesity (HCC)  BMI above 35 with co-morbidities   4. Dyslipidemia  Resume atorvastatin, he ran out of all medication   5.  Constipation due to opioid therapy  He prefers Linzess instead of Amitiza  - linaclotide (LINZESS) 145 MCG CAPS capsule; Take 1 capsule (145 mcg total) by mouth daily before breakfast.  Dispense: 90 capsule; Refill: 1  6. Chronic pain syndrome  Under control, seeing pain clinic   7. Continuous opioid dependence (HCC)  He is aware of long term use of opioids

## 2018-03-20 LAB — TOXASSURE SELECT 13 (MW), URINE

## 2018-06-13 ENCOUNTER — Other Ambulatory Visit: Payer: Self-pay

## 2018-06-13 ENCOUNTER — Encounter: Payer: Self-pay | Admitting: Nurse Practitioner

## 2018-06-13 ENCOUNTER — Ambulatory Visit: Payer: BLUE CROSS/BLUE SHIELD | Attending: Nurse Practitioner | Admitting: Nurse Practitioner

## 2018-06-13 VITALS — BP 166/77 | HR 75 | Temp 98.2°F | Ht 72.0 in | Wt 261.0 lb

## 2018-06-13 DIAGNOSIS — M545 Low back pain: Secondary | ICD-10-CM

## 2018-06-13 DIAGNOSIS — M47816 Spondylosis without myelopathy or radiculopathy, lumbar region: Secondary | ICD-10-CM | POA: Diagnosis not present

## 2018-06-13 DIAGNOSIS — Z79891 Long term (current) use of opiate analgesic: Secondary | ICD-10-CM | POA: Insufficient documentation

## 2018-06-13 DIAGNOSIS — G894 Chronic pain syndrome: Secondary | ICD-10-CM | POA: Insufficient documentation

## 2018-06-13 DIAGNOSIS — G8929 Other chronic pain: Secondary | ICD-10-CM | POA: Diagnosis present

## 2018-06-13 DIAGNOSIS — M62830 Muscle spasm of back: Secondary | ICD-10-CM | POA: Diagnosis present

## 2018-06-13 MED ORDER — OXYCODONE HCL ER 40 MG PO T12A: 40.0000 mg | EXTENDED_RELEASE_TABLET | Freq: Two times a day (BID) | ORAL | 0 refills | Status: DC

## 2018-06-13 MED ORDER — TIZANIDINE HCL 4 MG PO TABS: 4.0000 mg | ORAL_TABLET | Freq: Every day | ORAL | 2 refills | Status: DC

## 2018-06-13 MED ORDER — OXYCODONE-ACETAMINOPHEN 10-325 MG PO TABS: 1.0000 | ORAL_TABLET | Freq: Two times a day (BID) | ORAL | 0 refills | Status: DC

## 2018-06-13 MED ORDER — OXYCODONE-ACETAMINOPHEN 10-325 MG PO TABS: 1.0000 | ORAL_TABLET | Freq: Two times a day (BID) | ORAL | 0 refills | Status: DC | PRN

## 2018-06-13 NOTE — Progress Notes (Signed)
Nursing Pain Medication Assessment:  Safety precautions to be maintained throughout the outpatient stay will include: orient to surroundings, keep bed in low position, maintain call bell within reach at all times, provide assistance with transfer out of bed and ambulation.  Medication Inspection Compliance: Pill count conducted under aseptic conditions, in front of the patient. Neither the pills nor the bottle was removed from the patient's sight at any time. Once count was completed pills were immediately returned to the patient in their original bottle.  Medication #1: Oxycodone ER (OxyContin) Pill/Patch Count: 21 of 60 pills remain Pill/Patch Appearance: Markings consistent with prescribed medication Bottle Appearance: Standard pharmacy container. Clearly labeled. Filled Date: 2 / 56 / 2020 Last Medication intake:  Today  Medication #2: Hydrocodone/APAP Pill/Patch Count: 21 of 60 pills remain Pill/Patch Appearance: Markings consistent with prescribed medication Bottle Appearance: Standard pharmacy container. Clearly labeled. Filled Date: 2 / 66 / 2020 Last Medication intake:  Today

## 2018-06-13 NOTE — Patient Instructions (Signed)
____________________________________________________________________________________________  Medication Rules  Purpose: To inform patients, and their family members, of our rules and regulations.  Applies to: All patients receiving prescriptions (written or electronic).  Pharmacy of record: Pharmacy where electronic prescriptions will be sent. If written prescriptions are taken to a different pharmacy, please inform the nursing staff. The pharmacy listed in the electronic medical record should be the one where you would like electronic prescriptions to be sent.  Electronic prescriptions: In compliance with the [place] Strengthen Opioid Misuse Prevention (STOP) Act of 2017 (Session Law 2017-74/H243), effective April 06, 2018, all controlled substances must be electronically prescribed. Calling prescriptions to the pharmacy will cease to exist.  Prescription refills: Only during scheduled appointments. Applies to all prescriptions.  NOTE: The following applies primarily to controlled substances (Opioid* Pain Medications).   Patient's responsibilities: 1. Pain Pills: Bring all pain pills to every appointment (except for procedure appointments). 2. Pill Bottles: Bring pills in original pharmacy bottle. Always bring the newest bottle. Bring bottle, even if empty. 3. Medication refills: You are responsible for knowing and keeping track of what medications you take and those you need refilled. The day before your appointment: write a list of all prescriptions that need to be refilled. The day of the appointment: give the list to the admitting nurse. Prescriptions will be written only during appointments. No prescriptions will be written on procedure days. If you forget a medication: it will not be "Called in", "Faxed", or "electronically sent". You will need to get another appointment to get these prescribed. No early refills. Do not call asking to have your prescription filled  early. 4. Prescription Accuracy: You are responsible for carefully inspecting your prescriptions before leaving our office. Have the discharge nurse carefully go over each prescription with you, before taking them home. Make sure that your name is accurately spelled, that your address is correct. Check the name and dose of your medication to make sure it is accurate. Check the number of pills, and the written instructions to make sure they are clear and accurate. Make sure that you are given enough medication to last until your next medication refill appointment. 5. Taking Medication: Take medication as prescribed. When it comes to controlled substances, taking less pills or less frequently than prescribed is permitted and encouraged. Never take more pills than instructed. Never take medication more frequently than prescribed.  6. Inform other Doctors: Always inform, all of your healthcare providers, of all the medications you take. 7. Pain Medication from other Providers: You are not allowed to accept any additional pain medication from any other Doctor or Healthcare provider. There are two exceptions to this rule. (see below) In the event that you require additional pain medication, you are responsible for notifying us, as stated below. 8. Medication Agreement: You are responsible for carefully reading and following our Medication Agreement. This must be signed before receiving any prescriptions from our practice. Safely store a copy of your signed Agreement. Violations to the Agreement will result in no further prescriptions. (Additional copies of our Medication Agreement are available upon request.) 9. Laws, Rules, & Regulations: All patients are expected to follow all Federal and State Laws, Statutes, Rules, & Regulations. Ignorance of the Laws does not constitute a valid excuse. The use of any illegal substances is prohibited. 10. Adopted CDC guidelines & recommendations: Target dosing levels will be  at or below 60 MME/day. Use of benzodiazepines** is not recommended.  Exceptions: There are only two exceptions to the rule of not   receiving pain medications from other Healthcare Providers. 1. Exception #1 (Emergencies): In the event of an emergency (i.e.: accident requiring emergency care), you are allowed to receive additional pain medication. However, you are responsible for: As soon as you are able, call our office [phone], at any time of the day or night, and leave a message stating your name, the date and nature of the emergency, and the name and dose of the medication prescribed. In the event that your call is answered by a member of our staff, make sure to document and save the date, time, and the name of the person that took your information.  2. Exception #2 (Planned Surgery): In the event that you are scheduled by another doctor or dentist to have any type of surgery or procedure, you are allowed (for a period no longer than 30 days), to receive additional pain medication, for the acute post-op pain. However, in this case, you are responsible for picking up a copy of our "Post-op Pain Management for Surgeons" handout, and giving it to your surgeon or dentist. This document is available at our office, and does not require an appointment to obtain it. Simply go to our office during business hours (Monday-Thursday from 8:00 AM to 4:00 PM) (Friday 8:00 AM to 12:00 Noon) or if you have a scheduled appointment with us, prior to your surgery, and ask for it by name. In addition, you will need to provide us with your name, name of your surgeon, type of surgery, and date of procedure or surgery.  *Opioid medications include: morphine, codeine, oxycodone, oxymorphone, hydrocodone, hydromorphone, meperidine, tramadol, tapentadol, buprenorphine, fentanyl, methadone. **Benzodiazepine medications include: diazepam (Valium), alprazolam (Xanax), clonazepam (Klonopine), lorazepam (Ativan), clorazepate  (Tranxene), chlordiazepoxide (Librium), estazolam (Prosom), oxazepam (Serax), temazepam (Restoril), triazolam (Halcion) (Last updated: 06/03/2017) ____________________________________________________________________________________________    

## 2018-06-13 NOTE — Progress Notes (Signed)
Patient's Name: Lawrence Thornton.  MRN: [id number]  Referring Provider: Steele Sizer, MD  DOB: Mar 01, 1956  PCP: Steele Sizer, MD  DOS: 06/13/2018  Note by: Dionisio David, NP  Service setting: Ambulatory outpatient  Specialty: Interventional Pain Management  Location: [facility] (AMB) Pain Management Facility    Patient type: Established   HPI  Reason for Visit: [name]. is a 63 y.o. year old, male patient, who comes today with a chief complaint of Back Pain Last Appointment: His last appointment at our practice was on 03/15/2018. I last saw him on 03/15/2018.  Pain Assessment: Today, [name] describes the severity of the Chronic pain as a 4 /10. He indicates the location/referral of the pain to be Back Right, Lower/Denies. Onset was: More than a month ago. The quality of pain is described as Aching. Temporal description, or timing of pain is: Constant. Possible modifying factors: medication and rest. [name]  height is 6' (1.829 m) and weight is 261 lb (118.4 kg). His temperature is 98.2 F (36.8 C). His blood pressure is 166/77 (abnormal) and his pulse is 75. His oxygen saturation is 99%. He denies any new concerns or changes in his pain. He denies any side effects of his medication. He is doing well overall. He will follow up with is PCP on next month to have labs updated.   Controlled Substance Pharmacotherapy Assessment REMS (Risk Evaluation and Mitigation Strategy)  Analgesic:Percocet 10 mg2times daily as needed for breakthrough, OxyContin 40 mg twice daily. MME/day:Percocet equals30, OxyContin equals 148m/daytotal equals 150 (reduced from 165) BChauncey [name] RN  06/13/2018  8:50 AM  Sign when Signing Visit Nursing Pain Medication Assessment:  Safety precautions to be maintained throughout the outpatient stay will include: orient to surroundings, keep bed in low position, maintain call bell within reach at all times, provide assistance with transfer  out of bed and ambulation.  Medication Inspection Compliance: Pill count conducted under aseptic conditions, in front of the patient. Neither the pills nor the bottle was removed from the patient's sight at any time. Once count was completed pills were immediately returned to the patient in their original bottle.  Medication #1: Oxycodone ER (OxyContin) Pill/Patch Count: 21 of 60 pills remain Pill/Patch Appearance: Markings consistent with prescribed medication Bottle Appearance: Standard pharmacy container. Clearly labeled. Filled Date: 2 / 145/ 2020 Last Medication intake:  Today  Medication #2: Hydrocodone/APAP Pill/Patch Count: 21 of 60 pills remain Pill/Patch Appearance: Markings consistent with prescribed medication Bottle Appearance: Standard pharmacy container. Clearly labeled. Filled Date: 2 / 177/ 2020 Last Medication intake:  Today   Pharmacokinetics: Liberation and absorption (onset of action): WNL Distribution (time to peak effect): WNL Metabolism and excretion (duration of action): WNL         Pharmacodynamics: Desired effects: Analgesia: Lawrence Thornton >50% benefit. Functional ability: Patient reports that medication allows him to accomplish basic ADLs Clinically meaningful improvement in function (CMIF): Sustained CMIF goals met Perceived effectiveness: Described as relatively effective, allowing for increase in activities of daily living (ADL) Undesirable effects: Side-effects or Adverse reactions: None reported Monitoring: Lawrence Thornton PMP: Online review of the past 174-montheriod conducted. Compliant with practice rules and regulations Last UDS on record: Summary  Date Value Ref Range Status  03/15/2018 FINAL  Final    Comment:    ==================================================================== TOXASSURE SELECT 13 (MW) ==================================================================== Test  Result       Flag       Units Drug  Present and Declared for Prescription Verification   Oxycodone                      2108         EXPECTED   ng/mg creat   Oxymorphone                    2727         EXPECTED   ng/mg creat   Noroxycodone                   4079         EXPECTED   ng/mg creat   Noroxymorphone                 1205         EXPECTED   ng/mg creat    Sources of oxycodone are scheduled prescription medications.    Oxymorphone, noroxycodone, and noroxymorphone are expected    metabolites of oxycodone. Oxymorphone is also available as a    scheduled prescription medication. ==================================================================== Test                      Result    Flag   Units      Ref Range   Creatinine              77               mg/dL      >=20 ==================================================================== Declared Medications:  The flagging and interpretation on this report are based on the  following declared medications.  Unexpected results may arise from  inaccuracies in the declared medications.  **Note: The testing scope of this panel includes these medications:  Oxycodone (OxyContin)  Oxycodone (Percocet)  **Note: The testing scope of this panel does not include following  reported medications:  Acetaminophen (Percocet)  Aspirin (Aspirin 81)  Atorvastatin (Lipitor)  Lisinopril  Lubiprostone (Amitiza)  Metformin  Omega-3 Fatty Acids (Lovaza)  Tamsulosin (Flomax)  Tizanidine (Zanaflex) ==================================================================== For clinical consultation, please call [phone]. ====================================================================    UDS interpretation: Compliant          Medication Assessment Form: Reviewed. Patient indicates being compliant with therapy Treatment compliance: Compliant Risk Assessment Profile: Aberrant behavior: See initial evaluations. None observed or detected today Comorbid factors increasing risk of  overdose: See initial evaluation. No additional risks detected today Opioid risk tool (ORT):  Opioid Risk  06/13/2018  Alcohol 0  Illegal Drugs 0  Rx Drugs 0  Alcohol 0  Illegal Drugs 0  Rx Drugs 0  Age between 16-45 years  0  History of Preadolescent Sexual Abuse 0  Psychological Disease 0  Depression 0  Opioid Risk Tool Scoring 0  Opioid Risk Interpretation Low Risk    ORT Scoring interpretation table:  Score <3 = Low Risk for SUD  Score between 4-7 = Moderate Risk for SUD  Score >8 = High Risk for Opioid Abuse   Risk of substance use disorder (SUD): Low  Risk Mitigation Strategies:  Patient Counseling: Covered Patient-Prescriber Agreement (PPA): Present and active  Notification to other healthcare providers: Done  Pharmacologic Plan: No change in therapy, at this time.            ROS  Constitutional: Denies any fever or chills Gastrointestinal: No reported hemesis, hematochezia, vomiting, or acute GI distress  Musculoskeletal: Denies any acute onset joint swelling, redness, loss of ROM, or weakness Neurological: No reported episodes of acute onset apraxia, aphasia, dysarthria, agnosia, amnesia, paralysis, loss of coordination, or loss of consciousness  Medication Review  aspirin EC, atorvastatin, linaclotide, lisinopril, metFORMIN, omega-3 acid ethyl esters, oxyCODONE, oxyCODONE-acetaminophen, tamsulosin, and tiZANidine  History Review  Allergy: Mr. Leighty has No Known Allergies. Drug: Mr. Kathan  reports no history of drug use. Alcohol:  reports no history of alcohol use. Tobacco:  reports that he quit smoking about 19 years ago. His smoking use included cigarettes. He has a 30.00 pack-year smoking history. He has never used smokeless tobacco. Social: Mr. Pellegrin  reports that he quit smoking about 19 years ago. His smoking use included cigarettes. He has a 30.00 pack-year smoking history. He has never used smokeless tobacco. He reports that he does not drink alcohol  or use drugs. Medical:  has a past medical history of Arthritis and Hypertension. Surgical: Mr. 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 Family: family history includes Cancer in his mother; Heart attack in his mother; Hyperlipidemia in his father; Hypertension in his father. Problem List: Mr. Strohmeier does not have any pertinent problems on file.  Lab Review  Kidney Function Lab Results  Component Value Date   BUN 13 08/13/2017   CREATININE 0.86 62/94/7654   BCR NOT APPLICABLE 65/06/5463   GFRAA 108 08/13/2017   GFRNONAA 94 08/13/2017  Liver Function Lab Results  Component Value Date   AST 23 08/13/2017   ALT 26 08/13/2017   ALBUMIN 4.6 11/11/2015  Note: Above Lab results reviewed.  Imaging Review  Note: Reviewed        Physical Exam  General appearance: Well nourished, well developed, and well hydrated. In no apparent acute distress Mental status: Alert, oriented x 3 (person, place, & time)       Respiratory: No evidence of acute respiratory distress Eyes: PERLA Vitals: BP (!) 166/77   Pulse 75   Temp 98.2 F (36.8 C)   Ht 6' (1.829 m)   Wt 261 lb (118.4 kg)   SpO2 99%   BMI 35.40 kg/m  BMI: Estimated body mass index is 35.4 kg/m as calculated from the following:   Height as of this encounter: 6' (1.829 m).   Weight as of this encounter: 261 lb (118.4 kg). Ideal: Ideal body weight: 77.6 kg (171 lb 1.2 oz) Adjusted ideal body weight: 93.9 kg (207 lb 0.7 oz) Lumbar Spine Area Exam  Skin & Axial Inspection: Well healed scar from previous spine surgery  detected Alignment: Symmetrical Functional ROM: Unrestricted ROM       Stability: No instability detected Muscle Tone/Strength: Functionally intact. No obvious neuro-muscular anomalies detected. Sensory (Neurological): Unimpaired Palpation: No palpable anomalies       Provocative Tests: Hyperextension/rotation test: deferred today       Lumbar quadrant test (Kemp's test): deferred today       Lateral bending test: deferred today       Patrick's Maneuver: deferred today                    Gait & Posture Assessment  Ambulation:  Unassisted Gait: Relatively normal for age and body habitus Posture: WNL   Assessment   Status Diagnosis  Controlled Controlled Controlled 1. Lumbar spondylosis   2. Spasm of back muscles   3. Chronic right-sided low back pain without sciatica   4. Chronic pain syndrome   5. Long term prescription opiate use      Updated Problems: No problems updated.  Plan of Care  Pharmacotherapy (Medications Ordered): Meds ordered this encounter  Medications  . oxyCODONE (OXYCONTIN) 40 mg 12 hr tablet    Sig: Take 1 tablet (40 mg total) by mouth every 12 (twelve) hours for 30 days.    Dispense:  60 tablet    Refill:  0    Do not place this medication, or any other prescription from our practice, on "Automatic Refill". Patient may have prescription filled one day early if pharmacy is closed on scheduled refill date.    Order Specific Question:   Supervising Provider    Answer:   Gillis Santa [[id number]]  . oxyCODONE-acetaminophen (PERCOCET) 10-325 MG tablet    Sig: Take 1 tablet by mouth 2 (two) times daily as needed for up to 30 days for pain.    Dispense:  60 tablet    Refill:  0    Order Specific Question:   Supervising Provider    Answer:   Gillis Santa [id number]  . oxyCODONE (OXYCONTIN) 40 mg 12 hr tablet    Sig: Take 1 tablet (40 mg total) by mouth every 12 (twelve) hours for 30 days.    Dispense:  60 tablet    Refill:  0    Do not place this  medication on "Automatic Refill". Patient may have prescription filled one day early if pharmacy is closed on scheduled refill date.    Order Specific Question:   Supervising Provider    Answer:   Gillis Santa [[id number]]  . oxyCODONE (OXYCONTIN) 40 mg 12 hr tablet    Sig: Take 1 tablet (40 mg total) by mouth every 12 (twelve) hours for 30 days.    Dispense:  60 tablet    Refill:  0    Do not place this medication on "Automatic Refill". Patient may have prescription filled one day early if pharmacy is closed on scheduled refill date.    Order Specific Question:   Supervising Provider    Answer:   Gillis Santa [[id number]]  . oxyCODONE-acetaminophen (PERCOCET) 10-325 MG tablet    Sig: Take 1 tablet by mouth 2 (two) times daily for 30 days.    Dispense:  60 tablet    Refill:  0    Do not place this medication on "Automatic Refill". Patient may have prescription filled one day early if pharmacy is closed on scheduled refill date.    Order Specific Question:   Supervising Provider    Answer:   Gillis Santa [[id number]]  . oxyCODONE-acetaminophen (PERCOCET) 10-325 MG tablet    Sig: Take 1 tablet by mouth 2 (two) times daily for 30 days.    Dispense:  60 tablet    Refill:  0    Do not place this medication on "Automatic Refill". Patient may have prescription filled one day early if pharmacy is closed on scheduled refill date.    Order Specific Question:   Supervising Provider    Answer:   Gillis Santa [[id number]]  . tiZANidine (ZANAFLEX) 4 MG tablet    Sig: Take 1 tablet (4 mg total) by mouth at bedtime.    Dispense:  30 tablet    Refill:  2    Order Specific Question:   Supervising Provider    Answer:   Gillis Santa [[id number]]   Administered today: Georg Ruddle. Huey Romans. had no medications administered during this visit.  Orders:  No orders of the defined types were placed in this encounter.  Follow-up plan:   Return in about 3 months (around 09/13/2018) for MedMgmt. Not at this time.    Note  by: Dionisio David, NP Date: 06/13/2018; Time: 9:24 AM

## 2018-07-28 ENCOUNTER — Other Ambulatory Visit: Payer: Self-pay

## 2018-07-28 ENCOUNTER — Encounter: Payer: Self-pay | Admitting: Family Medicine

## 2018-07-28 ENCOUNTER — Ambulatory Visit (INDEPENDENT_AMBULATORY_CARE_PROVIDER_SITE_OTHER): Payer: BLUE CROSS/BLUE SHIELD | Admitting: Family Medicine

## 2018-07-28 VITALS — BP 142/78 | HR 81 | Temp 98.0°F | Resp 18 | Ht 72.0 in | Wt 269.4 lb

## 2018-07-28 DIAGNOSIS — E1169 Type 2 diabetes mellitus with other specified complication: Secondary | ICD-10-CM | POA: Diagnosis not present

## 2018-07-28 DIAGNOSIS — M545 Low back pain, unspecified: Secondary | ICD-10-CM

## 2018-07-28 DIAGNOSIS — N401 Enlarged prostate with lower urinary tract symptoms: Secondary | ICD-10-CM | POA: Diagnosis not present

## 2018-07-28 DIAGNOSIS — G8929 Other chronic pain: Secondary | ICD-10-CM

## 2018-07-28 DIAGNOSIS — I10 Essential (primary) hypertension: Secondary | ICD-10-CM

## 2018-07-28 DIAGNOSIS — N521 Erectile dysfunction due to diseases classified elsewhere: Secondary | ICD-10-CM

## 2018-07-28 DIAGNOSIS — R351 Nocturia: Secondary | ICD-10-CM

## 2018-07-28 DIAGNOSIS — F1120 Opioid dependence, uncomplicated: Secondary | ICD-10-CM

## 2018-07-28 DIAGNOSIS — G894 Chronic pain syndrome: Secondary | ICD-10-CM

## 2018-07-28 DIAGNOSIS — E785 Hyperlipidemia, unspecified: Secondary | ICD-10-CM

## 2018-07-28 MED ORDER — OMEGA-3-ACID ETHYL ESTERS 1 G PO CAPS: 2.0000 g | ORAL_CAPSULE | Freq: Two times a day (BID) | ORAL | 1 refills | Status: DC

## 2018-07-28 MED ORDER — TAMSULOSIN HCL 0.4 MG PO CAPS: 0.4000 mg | ORAL_CAPSULE | Freq: Every day | ORAL | 1 refills | Status: DC

## 2018-07-28 MED ORDER — METFORMIN HCL ER 750 MG PO TB24: 1500.0000 mg | ORAL_TABLET | Freq: Every day | ORAL | 1 refills | Status: DC

## 2018-07-28 MED ORDER — SILDENAFIL CITRATE 100 MG PO TABS: 100.0000 mg | ORAL_TABLET | Freq: Every day | ORAL | 0 refills | Status: DC | PRN

## 2018-07-28 MED ORDER — AMLODIPINE BESYLATE-VALSARTAN 5-160 MG PO TABS: 1.0000 | ORAL_TABLET | Freq: Every day | ORAL | 1 refills | Status: DC

## 2018-07-28 MED ORDER — ATORVASTATIN CALCIUM 20 MG PO TABS: 20.0000 mg | ORAL_TABLET | Freq: Every day | ORAL | 1 refills | Status: DC

## 2018-07-28 NOTE — Progress Notes (Signed)
Name: Lawrence Thornton.   MRN: [id number]    DOB: [birth year]   Date:07/28/2018       Progress Note  Subjective  Chief Complaint  Chief Complaint  Patient presents with  . Follow-up    4 month recheck    HPI  DMII:diagnosed May 2019 with a hgb6.9% He denies polydipsia ( but states likes water), no polyphagia, has nocturia from BPH. Discussed yearly eye exam and he will schedule it, urine microwas negative. Denies blurred vision. He has high triglycerides andhe wasgemfibrozil, but we switched from gemfibrozil to Lovaza usually taking one pill twice daily instead of two, taking Atorvastatin and we will recheck labs. He has not been as consistent with his diet. He also has ED and would like a refill of viagra . He does not monitor glucose at home   Chronic pain and opioid use: he was given Linzess and only takes it prn.  He states bowel movements are now daily, but states needs to take medication occasionally to help him have a bowel movement.He is under the care of [facility] and Dr. Cherylann Ratel, pain level now is 3/10  Morbid obesity: He has multiple co-morbidities: DM, HTN, hyperlipidemia. He has gained weight since last visit, he states he went to the beach to paint a house and ate much more than usual last week, but will resume diet now   BPH: Cardura did not work, Flomax is working for him, nocturia at most twice per night.   HTN: he is taking lisinopril 40 mg, but past few bp at doctor's visits has been elevated. He has BPH and we will avoid HCTZ, we will try Exforge 5/160 mg and monitor. No chest pain , no palpitation.    Patient Active Problem List   Diagnosis Date Noted  . Morbid obesity (HCC) 07/28/2018  . Lumbar spondylosis 03/15/2018  . Long term current use of opiate analgesic 12/15/2017  . Dyslipidemia associated with type 2 diabetes mellitus (HCC) 08/13/2017  . Osteoarthritis of acromioclavicular joint 07/30/2017  . S/P lumbar fusion 07/13/2017  . Chronic pain  syndrome 07/13/2017  . Personal history of colonic polyps   . Constipation due to opioid therapy 07/18/2015  . Hypertriglyceridemia 01/22/2015  . Extreme obesity 01/22/2015  . S/P knee replacement 12/21/2014  . Chronic low back pain 09/21/2014  . BPH associated with nocturia 09/21/2014  . Dyslipidemia 09/21/2014  . Continuous opioid dependence (HCC) 09/21/2014  . DDD (degenerative disc disease), lumbar 09/21/2014  . Failure of erection 09/21/2014    Past Surgical History:  Procedure Laterality Date  . APPENDECTOMY  age 56  . arthroscopic shoulder surgery Right 08/31/2017  . BACK SURGERY  lower back surgery x 3    L 3 L 4 and L 5 are fused together  . COLONOSCOPY WITH PROPOFOL N/A 05/04/2017   Procedure: COLONOSCOPY WITH PROPOFOL;  Surgeon: Midge Minium, MD;  Location: [facility] ENDOSCOPY;  Service: Endoscopy;  Laterality: N/A;  . LUMBAR FUSION  1999   L- to L5  . metal removed from eye  years ago  . sebaceous cyst removed Right 15 years ago   middle  finger   . TOE FUSION Right    4th toe  . 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 TOTAL KNEE ARTHROPLASTY Right 12/21/2014   Procedure: RIGHT TOTAL KNEE ARTHROPLASTY;  Surgeon: Eugenia Mcalpine, MD;  Location: [facility];  Service: Orthopedics;  Laterality: Right;  . TOTAL KNEE ARTHROPLASTY Right 12/21/14    Family History  Problem Relation Age of Onset  . Cancer Mother        Throat  . Heart attack Mother   . Hypertension Father   . Hyperlipidemia Father     Social History   Socioeconomic History  . Marital status: Married    Spouse name: Darl Pikes  . Number of children: 0  . Years of education: Not on file  . Highest education level: Not on file  Occupational History  . Occupation: mill write work   Engineer, production  . Financial resource strain: Somewhat hard  . Food insecurity:    Worry: Never true     Inability: Never true  . Transportation needs:    Medical: No    Non-medical: No  Tobacco Use  . Smoking status: Former Smoker    Packs/day: 1.50    Years: 20.00    Pack years: 30.00    Types: Cigarettes    Last attempt to quit: 04/07/1999    Years since quitting: 19.3  . Smokeless tobacco: Never Used  . Tobacco comment: not smoking   Substance and Sexual Activity  . Alcohol use: No  . Drug use: No  . Sexual activity: Yes    Birth control/protection: Condom  Lifestyle  . Physical activity:    Days per week: 7 days    Minutes per session: 30 min  . Stress: To some extent  Relationships  . Social connections:    Talks on phone: More than three times a week    Gets together: More than three times a week    Attends religious service: Never    Active member of club or organization: No    Attends meetings of clubs or organizations: Never    Relationship status: Married  . Intimate partner violence:    Fear of current or ex partner: No    Emotionally abused: No    Physically abused: No    Forced sexual activity: No  Other Topics Concern  . Not on file  Social History Narrative  . Not on file     Current Outpatient Medications:  .  aspirin EC 81 MG tablet, Take 1 tablet (81 mg total) by mouth daily., Disp: 30 tablet, Rfl: 0 .  atorvastatin (LIPITOR) 20 MG tablet, Take 1 tablet (20 mg total) by mouth daily., Disp: 90 tablet, Rfl: 1 .  metFORMIN (GLUCOPHAGE-XR) 750 MG 24 hr tablet, Take 2 tablets (1,500 mg total) by mouth daily with breakfast., Disp: 180 tablet, Rfl: 1 .  omega-3 acid ethyl esters (LOVAZA) 1 g capsule, Take 2 capsules (2 g total) by mouth 2 (two) times daily., Disp: 360 capsule, Rfl: 1 .  [START ON 08/23/2018] oxyCODONE (OXYCONTIN) 40 mg 12 hr tablet, Take 1 tablet (40 mg total) by mouth every 12 (twelve) hours for 30 days., Disp: 60 tablet, Rfl: 0 .  oxyCODONE (OXYCONTIN) 40 mg 12 hr tablet, Take 1 tablet (40 mg total) by mouth every 12 (twelve) hours for 30  days., Disp: 60 tablet, Rfl: 0 .  [START ON 08/23/2018] oxyCODONE-acetaminophen (PERCOCET) 10-325 MG tablet, Take 1 tablet by mouth 2 (two) times daily as needed for up to 30 days for pain., Disp: 60 tablet, Rfl: 0 .  tamsulosin (FLOMAX) 0.4 MG CAPS capsule, Take 1 capsule (0.4 mg total) by mouth daily., Disp: 90 capsule, Rfl: 1 .  tiZANidine (ZANAFLEX) 4 MG tablet, Take 1  tablet (4 mg total) by mouth at bedtime., Disp: 30 tablet, Rfl: 2 .  amLODipine-valsartan (EXFORGE) 5-160 MG tablet, Take 1 tablet by mouth daily., Disp: 90 tablet, Rfl: 1 .  linaclotide (LINZESS) 145 MCG CAPS capsule, Take 1 capsule (145 mcg total) by mouth daily before breakfast. (Patient not taking: Reported on 07/28/2018), Disp: 90 capsule, Rfl: 1 .  oxyCODONE-acetaminophen (PERCOCET) 10-325 MG tablet, Take 1 tablet by mouth 2 (two) times daily for 30 days., Disp: 60 tablet, Rfl: 0 .  sildenafil (VIAGRA) 100 MG tablet, Take 1 tablet (100 mg total) by mouth daily as needed for erectile dysfunction., Disp: 30 tablet, Rfl: 0  No Known Allergies  I personally reviewed active problem list, medication list, allergies, family history, social history with the patient/caregiver today.   ROS  Constitutional: Negative for fever or weight change.  Respiratory: Negative for cough and shortness of breath.   Cardiovascular: Negative for chest pain or palpitations.  Gastrointestinal: Negative for abdominal pain, no bowel changes.  Musculoskeletal: Negative for gait problem or joint swelling.  Skin: Negative for rash.  Neurological: Negative for dizziness or headache.  No other specific complaints in a complete review of systems (except as listed in HPI above).  Objective  Vitals:   07/28/18 0749  BP: (!) 142/78  Pulse: 81  Resp: 18  Temp: 98 F (36.7 C)  TempSrc: Oral  SpO2: 99%  Weight: 269 lb 6.4 oz (122.2 kg)  Height: 6' (1.829 m)    Body mass index is 36.54 kg/m.  Physical Exam  Constitutional: Patient appears  well-developed and well-nourished. Obese No distress.  HEENT: head atraumatic, normocephalic, pupils equal and reactive to light,neck supple, oral exam not done, wearing a surgical mask  Cardiovascular: Normal rate, regular rhythm and normal heart sounds.  No murmur heard. No BLE edema. Pulmonary/Chest: Effort normal and breath sounds normal. No respiratory distress. Abdominal: Soft.  There is no tenderness. Psychiatric: Patient has a normal mood and affect. behavior is normal. Judgment and thought content normal.  PHQ2/9: Depression screen [facility] 2/9 07/28/2018 03/18/2018 11/15/2017 09/14/2017 07/13/2017  Decreased Interest 0 0 0 0 0  Down, Depressed, Hopeless 0 0 0 0 0  PHQ - 2 Score 0 0 0 0 0  Altered sleeping 0 0 0 - -  Tired, decreased energy 0 0 0 - -  Change in appetite 0 0 0 - -  Feeling bad or failure about yourself  0 0 0 - -  Trouble concentrating 0 0 0 - -  Moving slowly or fidgety/restless 0 0 0 - -  Suicidal thoughts 0 0 0 - -  PHQ-9 Score 0 0 0 - -  Difficult doing work/chores Not difficult at all Not difficult at all Not difficult at all - -  Some recent data might be hidden    phq 9 is negative   Fall Risk: Fall Risk  07/28/2018 06/13/2018 06/13/2018 03/18/2018 03/15/2018  Falls in the past year? 1 0 0 0 0  Number falls in past yr: 0 - - 0 0  Injury with Fall? 0 - - 0 0  Follow up Falls evaluation completed - - - -   In his backyard, in his shop  Assessment & Plan   1. Essential hypertension  - COMPLETE METABOLIC PANEL WITH GFR - CBC with Differential/Platelet - amLODipine-valsartan (EXFORGE) 5-160 MG tablet; Take 1 tablet by mouth daily.  Dispense: 90 tablet; Refill: 1  2. Dyslipidemia associated with type 2 diabetes mellitus (HCC)  - Lipid  panel - Hemoglobin A1c - metFORMIN (GLUCOPHAGE-XR) 750 MG 24 hr tablet; Take 2 tablets (1,500 mg total) by mouth daily with breakfast.  Dispense: 180 tablet; Refill: 1 - omega-3 acid ethyl esters (LOVAZA) 1 g capsule; Take 2  capsules (2 g total) by mouth 2 (two) times daily.  Dispense: 360 capsule; Refill: 1 - atorvastatin (LIPITOR) 20 MG tablet; Take 1 tablet (20 mg total) by mouth daily.  Dispense: 90 tablet; Refill: 1  3. BPH associated with nocturia  - tamsulosin (FLOMAX) 0.4 MG CAPS capsule; Take 1 capsule (0.4 mg total) by mouth daily.  Dispense: 90 capsule; Refill: 1  4. Continuous opioid dependence (HCC)  Under the care of pain management  5. Morbid obesity (HCC)  BMI above 35 with co-morbidities   6. Dyslipidemia  On statin therapy recheck labs   7. Chronic right-sided low back pain without sciatica   8. Chronic pain syndrome  Under care of pain clinic   9. Erectile dysfunction due to type 2 diabetes mellitus (HCC)  - sildenafil (VIAGRA) 100 MG tablet; Take 1 tablet (100 mg total) by mouth daily as needed for erectile dysfunction.  Dispense: 30 tablet; Refill: 0

## 2018-07-29 LAB — HEMOGLOBIN A1C
Hgb A1c MFr Bld: 5.8 % of total Hgb — ABNORMAL HIGH (ref <5.7)
Mean Plasma Glucose: 120 (calc)
eAG (mmol/L): 6.6 (calc)

## 2018-07-29 LAB — COMPLETE METABOLIC PANEL WITH GFR
AG Ratio: 1.4 (calc) (ref 1.0–2.5)
ALT: 20 U/L (ref 9–46)
AST: 18 U/L (ref 10–35)
Albumin: 4.4 g/dL (ref 3.6–5.1)
Alkaline phosphatase (APISO): 94 U/L (ref 35–144)
BUN: 12 mg/dL (ref 7–25)
CO2: 30 mmol/L (ref 20–32)
Calcium: 9.8 mg/dL (ref 8.6–10.3)
Chloride: 103 mmol/L (ref 98–110)
Creat: 0.75 mg/dL (ref 0.70–1.25)
GFR, Est African American: 114 mL/min/{1.73_m2} (ref >=60)
GFR, Est Non African American: 98 mL/min/{1.73_m2} (ref >=60)
Globulin: 3.1 g/dL (calc) (ref 1.9–3.7)
Glucose, Bld: 99 mg/dL (ref 65–99)
Potassium: 4.9 mmol/L (ref 3.5–5.3)
Sodium: 139 mmol/L (ref 135–146)
Total Bilirubin: 0.2 mg/dL (ref 0.2–1.2)
Total Protein: 7.5 g/dL (ref 6.1–8.1)

## 2018-07-29 LAB — MICROALBUMIN / CREATININE URINE RATIO
Creatinine, Urine: 45 mg/dL (ref 20–320)
Microalb Creat Ratio: 7 mcg/mg creat (ref <30)
Microalb, Ur: 0.3 mg/dL

## 2018-07-29 LAB — CBC WITH DIFFERENTIAL/PLATELET
Absolute Monocytes: 521 cells/uL (ref 200–950)
Basophils Absolute: 37 cells/uL (ref 0–200)
Basophils Relative: 0.6 %
Eosinophils Absolute: 347 cells/uL (ref 15–500)
Eosinophils Relative: 5.6 %
HCT: 41.2 % (ref 38.5–50.0)
Hemoglobin: 14.2 g/dL (ref 13.2–17.1)
Lymphs Abs: 2269 cells/uL (ref 850–3900)
MCH: 30.6 pg (ref 27.0–33.0)
MCHC: 34.5 g/dL (ref 32.0–36.0)
MCV: 88.8 fL (ref 80.0–100.0)
MPV: 9.7 fL (ref 7.5–12.5)
Monocytes Relative: 8.4 %
Neutro Abs: 3026 cells/uL (ref 1500–7800)
Neutrophils Relative %: 48.8 %
Platelets: 297 10*3/uL (ref 140–400)
RBC: 4.64 10*6/uL (ref 4.20–5.80)
RDW: 12.4 % (ref 11.0–15.0)
Total Lymphocyte: 36.6 %
WBC: 6.2 10*3/uL (ref 3.8–10.8)

## 2018-07-29 LAB — LIPID PANEL
Cholesterol: 131 mg/dL (ref <200)
HDL: 48 mg/dL (ref >=40)
LDL Cholesterol (Calc): 61 mg/dL (calc)
Non-HDL Cholesterol (Calc): 83 mg/dL (calc) (ref <130)
Total CHOL/HDL Ratio: 2.7 (calc) (ref <5.0)
Triglycerides: 141 mg/dL (ref <150)

## 2018-09-09 ENCOUNTER — Encounter: Payer: Self-pay | Admitting: Pain Medicine

## 2018-09-12 ENCOUNTER — Other Ambulatory Visit: Payer: Self-pay

## 2018-09-12 ENCOUNTER — Encounter: Payer: Self-pay | Admitting: Student in an Organized Health Care Education/Training Program

## 2018-09-12 ENCOUNTER — Ambulatory Visit
Payer: BLUE CROSS/BLUE SHIELD | Attending: Nurse Practitioner | Admitting: Student in an Organized Health Care Education/Training Program

## 2018-09-12 DIAGNOSIS — M47816 Spondylosis without myelopathy or radiculopathy, lumbar region: Secondary | ICD-10-CM | POA: Diagnosis not present

## 2018-09-12 DIAGNOSIS — Z79891 Long term (current) use of opiate analgesic: Secondary | ICD-10-CM | POA: Diagnosis not present

## 2018-09-12 DIAGNOSIS — M545 Low back pain, unspecified: Secondary | ICD-10-CM

## 2018-09-12 DIAGNOSIS — Z981 Arthrodesis status: Secondary | ICD-10-CM

## 2018-09-12 DIAGNOSIS — M5136 Other intervertebral disc degeneration, lumbar region: Secondary | ICD-10-CM

## 2018-09-12 DIAGNOSIS — M62830 Muscle spasm of back: Secondary | ICD-10-CM | POA: Diagnosis not present

## 2018-09-12 DIAGNOSIS — G894 Chronic pain syndrome: Secondary | ICD-10-CM

## 2018-09-12 DIAGNOSIS — G8929 Other chronic pain: Secondary | ICD-10-CM

## 2018-09-12 MED ORDER — OXYCODONE-ACETAMINOPHEN 10-325 MG PO TABS: 1.0000 | ORAL_TABLET | Freq: Two times a day (BID) | ORAL | 0 refills | Status: DC | PRN

## 2018-09-12 MED ORDER — OXYCODONE HCL ER 40 MG PO T12A: 40.0000 mg | EXTENDED_RELEASE_TABLET | Freq: Two times a day (BID) | ORAL | 0 refills | Status: DC

## 2018-09-12 MED ORDER — TIZANIDINE HCL 4 MG PO TABS: 4.0000 mg | ORAL_TABLET | Freq: Every day | ORAL | 2 refills | Status: AC

## 2018-09-12 NOTE — Progress Notes (Signed)
Pain Management Virtual Encounter Note - Virtual Visit via Video Conference Telehealth (real-time audio visits between healthcare provider and patient).   Patient's Phone No. & Preferred Pharmacy:  [phone] (home); [phone] (mobile); (Preferred) [phone] [email or web address]  [facility] [phone] [place]- [place], [place] - [address] AT [facility] [address] [place] [address] Phone: [phone] Fax: [phone]  [facility] [address] [address] [place] [address] Phone: [phone] Fax: [phone]    Pre-screening note:  Our staff contacted [name] and offered him an "in person", "face-to-face" appointment versus a telephone encounter. He indicated preferring the telephone encounter, at this time.   Reason for Virtual Visit: COVID-19*  Social distancing based on CDC and AMA recommendations.   I contacted Lawrence PaulsMarshall R Heringer Jr. on 09/12/2018 via video conference.      I clearly identified myself as Edward JollyBilal Kirstina Leinweber, MD. I verified that I was speaking with the correct person using two identifiers (Name: Lawrence PaulsMarshall R Ewell Jr., and date of birth: 09-Nov-1955).  Advanced Informed Consent I sought verbal advanced consent from Lawrence PaulsMarshall R Robenson Jr. for virtual visit interactions. I informed [name] of possible security and privacy concerns, risks, and limitations associated with providing "not-in-person" medical evaluation and management services. I also informed [name] of the availability of "in-person" appointments. Finally, I informed him that there would be a charge for the virtual visit and that he could be  personally, fully or partially, financially responsible for it. [name] expressed understanding and agreed to proceed.   Historic Elements   Mr. Lawrence PaulsMarshall R Stingley Jr. is a 63 y.o. year old, male patient evaluated today after his last encounter by our practice on 06/13/2018.  [name]  has a past medical history of Arthritis and Hypertension. He also  has a past surgical history that includes Back surgery (lower back surgery x 3 ); Appendectomy (age 63); sebaceous cyst removed (Right, 15 years ago); Toe Fusion (Right); metal removed from eye (years ago); Total knee arthroplasty (Left, 08/10/2014); Total knee arthroplasty (Right, 12/21/2014); Total knee arthroplasty (Right, 12/21/14); Lumbar fusion (1999); Colonoscopy with propofol (N/A, 05/04/2017); and arthroscopic shoulder surgery (Right, 08/31/2017). [name] has a current medication list which includes the following prescription(s): amlodipine-valsartan, aspirin ec, atorvastatin, linaclotide, metformin, omega-3 acid ethyl esters, oxycodone, oxycodone, oxycodone, oxycodone-acetaminophen, oxycodone-acetaminophen, oxycodone-acetaminophen, sildenafil, tamsulosin, and tizanidine. He  reports that he quit smoking about 19 years ago. His smoking use included cigarettes. He has a 30.00 pack-year smoking history. He has never used smokeless tobacco. He reports that he does not drink alcohol or use drugs. [name] has No Known Allergies.   HPI  Today, he is being contacted for medication management.   No changes in medical history.  Pain at baseline.  States that medications help manage his pain and help improve his functional status.  Pharmacotherapy Assessment   08/24/2018  1   06/13/2018  Oxycontin Er 40 MG Tablet  60.00 30 Cr Kin   [id number]   Wal (0252)   0  120.00 MME  Comm Ins   [place]  08/24/2018  1   06/13/2018  Oxycodone-Acetaminophen 10-325  60.00 30 Cr Kin   [id number]   Wal (0252)   0  30.00 MME  Comm Ins   [place]   Total MME equals 150 Monitoring: Pharmacotherapy: No side-effects or adverse reactions reported. [place] PMP: PDMP reviewed during this encounter.       Compliance: No problems identified.  Effectiveness: Clinically acceptable. Plan: Refer to "POC".  Pertinent Labs   SAFETY SCREENING Profile Lab Results   Component Value Date   STAPHAUREUS NEGATIVE 12/13/2014   MRSAPCR NEGATIVE 12/13/2014   HCVAB NEGATIVE 10/06/2016   Renal Function Lab Results  Component Value Date   BUN 12 07/28/2018   CREATININE 0.75 70/62/3762   BCR NOT APPLICABLE 83/15/1761   GFRAA 114 07/28/2018   GFRNONAA 98 07/28/2018   Hepatic Function Lab Results  Component Value Date   AST 18 07/28/2018   ALT 20 07/28/2018   ALBUMIN 4.6 11/11/2015   UDS Summary  Date Value Ref Range Status  03/15/2018 FINAL  Final    Comment:    ==================================================================== TOXASSURE SELECT 13 (MW) ==================================================================== Test                             Result       Flag       Units Drug Present and Declared for Prescription Verification   Oxycodone                      2108         EXPECTED   ng/mg creat   Oxymorphone                    2727         EXPECTED   ng/mg creat   Noroxycodone                   4079         EXPECTED   ng/mg creat   Noroxymorphone                 1205         EXPECTED   ng/mg creat    Sources of oxycodone are scheduled prescription medications.    Oxymorphone, noroxycodone, and noroxymorphone are expected    metabolites of oxycodone. Oxymorphone is also available as a    scheduled prescription medication. ==================================================================== Test                      Result    Flag   Units      Ref Range   Creatinine              77               mg/dL      >=20 ==================================================================== Declared Medications:  The flagging and interpretation on this report are based on the  following declared medications.  Unexpected results may arise from  inaccuracies in the declared medications.  **Note: The testing scope of this panel includes these medications:  Oxycodone (OxyContin)  Oxycodone (Percocet)  **Note: The testing scope of this panel  does not include following  reported medications:  Acetaminophen (Percocet)  Aspirin (Aspirin 81)  Atorvastatin (Lipitor)  Lisinopril  Lubiprostone (Amitiza)  Metformin  Omega-3 Fatty Acids (Lovaza)  Tamsulosin (Flomax)  Tizanidine (Zanaflex) ==================================================================== For clinical consultation, please call [phone]. ====================================================================    Note: Above Lab results reviewed.   Assessment  The primary encounter diagnosis was Lumbar spondylosis. Diagnoses of Long term prescription opiate use, Spasm of back muscles, Chronic right-sided low back pain without sciatica, Chronic pain syndrome, S/P lumbar fusion, Lumbar degenerative disc disease, and Chronic use of opiate for therapeutic purpose were also pertinent to this visit.  Plan of Care  I  am having Lawrence SamsMarshall R. Gentry RochSheppard Jr. start on oxyCODONE, oxyCODONE, oxyCODONE-acetaminophen, and oxyCODONE-acetaminophen. I am also having him maintain his aspirin EC, linaclotide, metFORMIN, omega-3 acid ethyl esters, tamsulosin, atorvastatin, amLODipine-valsartan, sildenafil, oxyCODONE-acetaminophen, oxyCODONE, and tiZANidine.  Fill dates of OxyContin and Percocet as below for 3 months 09/23/2018, 10/23/2018, 02/24/2019.  UDS at next visit.  Consider weaning OxyContin at next visit.  Refill of tizanidine as below as well.  Pharmacotherapy (Medications Ordered): Meds ordered this encounter  Medications  . oxyCODONE-acetaminophen (PERCOCET) 10-325 MG tablet    Sig: Take 1 tablet by mouth 2 (two) times daily as needed for up to 30 days for pain.    Dispense:  60 tablet    Refill:  0  . oxyCODONE (OXYCONTIN) 40 mg 12 hr tablet    Sig: Take 1 tablet (40 mg total) by mouth every 12 (twelve) hours for 30 days.    Dispense:  60 tablet    Refill:  0    Do not place this medication, or any other prescription from our practice, on "Automatic Refill". Patient may  have prescription filled one day early if pharmacy is closed on scheduled refill date.  [place] Kitchen. tiZANidine (ZANAFLEX) 4 MG tablet    Sig: Take 1 tablet (4 mg total) by mouth at bedtime.    Dispense:  30 tablet    Refill:  2  . oxyCODONE (OXYCONTIN) 40 mg 12 hr tablet    Sig: Take 1 tablet (40 mg total) by mouth every 12 (twelve) hours for 30 days.    Dispense:  60 tablet    Refill:  0    Do not place this medication, or any other prescription from our practice, on "Automatic Refill". Patient may have prescription filled one day early if pharmacy is closed on scheduled refill date.  [place] Kitchen. oxyCODONE (OXYCONTIN) 40 mg 12 hr tablet    Sig: Take 1 tablet (40 mg total) by mouth every 12 (twelve) hours for 30 days.    Dispense:  60 tablet    Refill:  0    Do not place this medication, or any other prescription from our practice, on "Automatic Refill". Patient may have prescription filled one day early if pharmacy is closed on scheduled refill date.  [place] Kitchen. oxyCODONE-acetaminophen (PERCOCET) 10-325 MG tablet    Sig: Take 1 tablet by mouth 2 (two) times daily as needed for up to 30 days for pain.    Dispense:  60 tablet    Refill:  0  . oxyCODONE-acetaminophen (PERCOCET) 10-325 MG tablet    Sig: Take 1 tablet by mouth 2 (two) times daily as needed for up to 30 days for pain.    Dispense:  60 tablet    Refill:  0   Orders:  No orders of the defined types were placed in this encounter.  Follow-up plan:   Return in about 3 months (around 12/13/2018) for Medication Management.    I discussed the assessment and treatment plan with the patient. The patient was provided an opportunity to ask questions and all were answered. The patient agreed with the plan and demonstrated an understanding of the instructions.  Patient advised to call back or seek an in-person evaluation if the symptoms or condition worsens.  Total duration of non-face-to-face encounter: 25 minutes.  Note by: Edward JollyBilal Sybol Morre, MD Date: 09/12/2018;  Time: 1:03 PM  Note: This dictation was prepared with Dragon dictation. Any transcriptional errors that may result from this process are unintentional.  Disclaimer:  * Given the special  circumstances of the COVID-19 pandemic, the federal government has announced that [employer] ([employer]) will exercise its enforcement discretion and will not impose penalties on physicians using telehealth in the event of noncompliance with regulatory requirements under the [place] (HIPAA) in connection with the good faith provision of telehealth during the [place]-35 national public health emergency. ([place])

## 2018-09-13 ENCOUNTER — Encounter: Payer: BLUE CROSS/BLUE SHIELD | Admitting: Nurse Practitioner

## 2018-11-02 ENCOUNTER — Encounter: Payer: Self-pay | Admitting: Family Medicine

## 2018-11-02 ENCOUNTER — Other Ambulatory Visit: Payer: Self-pay

## 2018-11-02 ENCOUNTER — Ambulatory Visit (INDEPENDENT_AMBULATORY_CARE_PROVIDER_SITE_OTHER): Payer: BLUE CROSS/BLUE SHIELD | Admitting: Family Medicine

## 2018-11-02 VITALS — BP 141/78 | HR 76 | Ht 72.0 in | Wt 269.0 lb

## 2018-11-02 DIAGNOSIS — E785 Hyperlipidemia, unspecified: Secondary | ICD-10-CM

## 2018-11-02 DIAGNOSIS — G894 Chronic pain syndrome: Secondary | ICD-10-CM | POA: Diagnosis not present

## 2018-11-02 DIAGNOSIS — E1169 Type 2 diabetes mellitus with other specified complication: Secondary | ICD-10-CM | POA: Diagnosis not present

## 2018-11-02 DIAGNOSIS — F1120 Opioid dependence, uncomplicated: Secondary | ICD-10-CM | POA: Diagnosis not present

## 2018-11-02 DIAGNOSIS — N521 Erectile dysfunction due to diseases classified elsewhere: Secondary | ICD-10-CM

## 2018-11-02 DIAGNOSIS — I10 Essential (primary) hypertension: Secondary | ICD-10-CM

## 2018-11-02 MED ORDER — AMLODIPINE BESYLATE-VALSARTAN 5-320 MG PO TABS: 1.0000 | ORAL_TABLET | Freq: Every day | ORAL | 0 refills | Status: DC

## 2018-11-02 NOTE — Progress Notes (Signed)
Name: Lawrence PaulsMarshall R Bias Jr.   MRN: [id number]    DOB: 04-24-1955   Date:11/02/2018       Progress Note  Subjective  Chief Complaint  Chief Complaint  Patient presents with  . Medication Refill  . Diabetes  . Hypertension    Denies any symptoms  . Pain  . Benign Prostatic Hypertrophy    I connected with  Lawrence PaulsMarshall R Wooden Jr.  on 11/02/18 at 11:40 AM EDT by a video enabled telemedicine application and verified that I am speaking with the correct person using two identifiers.  I discussed the limitations of evaluation and management by telemedicine and the availability of in person appointments. The patient expressed understanding and agreed to proceed. Staff also discussed with the patient that there may be a patient responsible charge related to this service. Patient Location: at home  [facility]   HPI  DMII:diagnosed May 2019 with a hgb6.9% He denies polydipsia ( but states likes water), no polyphagia, has nocturia from BPH. Discussed yearly eye exam and he will schedule it, urine microwas negative. Denies blurred vision. He has high triglycerides andhe wasgemfibrozil, but we switched from gemfibrozil to Lovaza usually taking one pill twice daily instead of two, taking Atorvastatin , reviewed last labs He has not been as consistent with his diet. He also has ED and would like a refill of viagra . Taking metformin and denies side effects  Chronic pain and opioid use: he was given Linzess and only takes it prn.He states bowel movements are now daily, but states needs to take medication occasionally to help him have a bowel movement.[facility] Dr. Cherylann RatelLateef, pain level now is 3/10, able to be active with the pain like this  Morbid obesity: He has multiple co-morbidities: DM, HTN, hyperlipidemia. His weight is 7 lbs higher than Dec but stable since last visit, he has been trying to walk daily.   BPH: Cardura did  not work, Flomax is working for him, nocturia at most twice per night. Unchanged  HTN: he was taking lisinopril 40 mg, but bp was staying elevated so we switched him to Exforge 5/160, he denies side effects of medication.No chest pain , no palpitation.   Patient Active Problem List   Diagnosis Date Noted  . Morbid obesity (HCC) 07/28/2018  . Lumbar spondylosis 03/15/2018  . Long term current use of opiate analgesic 12/15/2017  . Dyslipidemia associated with type 2 diabetes mellitus (HCC) 08/13/2017  . Osteoarthritis of acromioclavicular joint 07/30/2017  . S/P lumbar fusion 07/13/2017  . Chronic pain syndrome 07/13/2017  . Personal history of colonic polyps   . Constipation due to opioid therapy 07/18/2015  . Hypertriglyceridemia 01/22/2015  . Extreme obesity 01/22/2015  . S/P knee replacement 12/21/2014  . Chronic low back pain 09/21/2014  . BPH associated with nocturia 09/21/2014  . Dyslipidemia 09/21/2014  . Continuous opioid dependence (HCC) 09/21/2014  . DDD (degenerative disc disease), lumbar 09/21/2014  . Failure of erection 09/21/2014    Past Surgical History:  Procedure Laterality Date  . APPENDECTOMY  age 63  . arthroscopic shoulder surgery Right 08/31/2017  . BACK SURGERY  lower back surgery x 3    L 3 L 4 and L 5 are fused together  . COLONOSCOPY WITH PROPOFOL N/A 05/04/2017   Procedure: COLONOSCOPY WITH PROPOFOL;  Surgeon: Midge MiniumWohl, Darren, MD;  [facility];  Service: Endoscopy;  Laterality: N/A;  . LUMBAR FUSION  1999  L- to L5  . metal removed from eye  years ago  . sebaceous cyst removed Right 15 years ago   middle  finger   . TOE FUSION Right    4th toe  . TOTAL KNEE ARTHROPLASTY Left 08/10/2014   Procedure: LEFT TOTAL KNEE ARTHROPLASTY;  Surgeon: Sydnee Cabal, MD;  Location: [facility] ORS;  Service: Orthopedics;  Laterality: Left;  . TOTAL KNEE ARTHROPLASTY Right 12/21/2014   Procedure: RIGHT TOTAL KNEE ARTHROPLASTY;  Surgeon: Sydnee Cabal, MD;   Location: [facility];  Service: Orthopedics;  Laterality: Right;  . TOTAL KNEE ARTHROPLASTY Right 12/21/14    Family History  Problem Relation Age of Onset  . Cancer Mother        Throat  . Heart attack Mother   . Hypertension Father   . Hyperlipidemia Father     Social History   Socioeconomic History  . Marital status: Married    Spouse name: Manuela Schwartz  . Number of children: 0  . Years of education: Not on file  . Highest education level: Not on file  Occupational History  . Occupation: mill write work   Scientific laboratory technician  . Financial resource strain: Somewhat hard  . Food insecurity    Worry: Never true    Inability: Never true  . Transportation needs    Medical: No    Non-medical: No  Tobacco Use  . Smoking status: Former Smoker    Packs/day: 1.50    Years: 20.00    Pack years: 30.00    Types: Cigarettes    Quit date: 04/07/1999    Years since quitting: 19.5  . Smokeless tobacco: Never Used  . Tobacco comment: not smoking   Substance and Sexual Activity  . Alcohol use: No  . Drug use: No  . Sexual activity: Yes    Birth control/protection: Condom  Lifestyle  . Physical activity    Days per week: 7 days    Minutes per session: 30 min  . Stress: To some extent  Relationships  . Social connections    Talks on phone: More than three times a week    Gets together: More than three times a week    Attends religious service: Never    Active member of club or organization: No    Attends meetings of clubs or organizations: Never    Relationship status: Married  . Intimate partner violence    Fear of current or ex partner: No    Emotionally abused: No    Physically abused: No    Forced sexual activity: No  Other Topics Concern  . Not on file  Social History Narrative  . Not on file     Current Outpatient Medications:  .  aspirin EC 81 MG tablet, Take 1 tablet (81 mg total) by mouth daily., Disp: 30 tablet, Rfl: 0 .  atorvastatin (LIPITOR) 20 MG tablet, Take 1 tablet  (20 mg total) by mouth daily., Disp: 90 tablet, Rfl: 1 .  linaclotide (LINZESS) 145 MCG CAPS capsule, Take 1 capsule (145 mcg total) by mouth daily before breakfast., Disp: 90 capsule, Rfl: 1 .  metFORMIN (GLUCOPHAGE-XR) 750 MG 24 hr tablet, Take 2 tablets (1,500 mg total) by mouth daily with breakfast., Disp: 180 tablet, Rfl: 1 .  omega-3 acid ethyl esters (LOVAZA) 1 g capsule, Take 2 capsules (2 g total) by mouth 2 (two) times daily., Disp: 360 capsule, Rfl: 1 .  oxyCODONE (OXYCONTIN) 40 mg 12 hr tablet, Take 1 tablet (40 mg total)  by mouth every 12 (twelve) hours for 30 days., Disp: 60 tablet, Rfl: 0 .  [START ON 11/22/2018] oxyCODONE (OXYCONTIN) 40 mg 12 hr tablet, Take 1 tablet (40 mg total) by mouth every 12 (twelve) hours for 30 days., Disp: 60 tablet, Rfl: 0 .  oxyCODONE-acetaminophen (PERCOCET) 10-325 MG tablet, Take 1 tablet by mouth 2 (two) times daily as needed for up to 30 days for pain., Disp: 60 tablet, Rfl: 0 .  [START ON 11/22/2018] oxyCODONE-acetaminophen (PERCOCET) 10-325 MG tablet, Take 1 tablet by mouth 2 (two) times daily as needed for up to 30 days for pain., Disp: 60 tablet, Rfl: 0 .  sildenafil (VIAGRA) 100 MG tablet, Take 1 tablet (100 mg total) by mouth daily as needed for erectile dysfunction., Disp: 30 tablet, Rfl: 0 .  tamsulosin (FLOMAX) 0.4 MG CAPS capsule, Take 1 capsule (0.4 mg total) by mouth daily., Disp: 90 capsule, Rfl: 1 .  tiZANidine (ZANAFLEX) 4 MG tablet, Take 1 tablet (4 mg total) by mouth at bedtime., Disp: 30 tablet, Rfl: 2  No Known Allergies  I personally reviewed active problem list, medication list, allergies, family history, social history with the patient/caregiver today.   ROS  Ten systems reviewed and is negative except as mentioned in HPI    Objective  Virtual encounter, vitals not obtained.  Vitals:   11/02/18 1009  BP: (!) 141/78  Pulse: 76   Body mass index is 36.48 kg/m.  Physical Exam  Awake, alert and oriented   PHQ2/9:  Depression screen [facility]/9 11/02/2018 07/28/2018 03/18/2018 11/15/2017 09/14/2017  Decreased Interest 0 0 0 0 0  Down, Depressed, Hopeless 0 0 0 0 0  PHQ - 2 Score 0 0 0 0 0  Altered sleeping 0 0 0 0 -  Tired, decreased energy 0 0 0 0 -  Change in appetite 0 0 0 0 -  Feeling bad or failure about yourself  0 0 0 0 -  Trouble concentrating 0 0 0 0 -  Moving slowly or fidgety/restless 0 0 0 0 -  Suicidal thoughts 0 0 0 0 -  PHQ-9 Score 0 0 0 0 -  Difficult doing work/chores Not difficult at all Not difficult at all Not difficult at all Not difficult at all -  Some recent data might be hidden   PHQ-2/9 Result is negative.    Fall Risk: Fall Risk  11/02/2018 07/28/2018 06/13/2018 06/13/2018 03/18/2018  Falls in the past year? 0 1 0 0 0  Number falls in past yr: 0 0 - - 0  Injury with Fall? 0 0 - - 0  Follow up - Falls evaluation completed - - -     Assessment & Plan  1. Essential hypertension  - amLODipine-valsartan (EXFORGE) 5-320 MG tablet; Take 1 tablet by mouth daily.  Dispense: 90 tablet; Refill: 0  2. Dyslipidemia associated with type 2 diabetes mellitus (HCC)  Taking medications as prescribed   3. Continuous opioid dependence (HCC)   4. Chronic pain syndrome  Doing well , under the care of Dr. Cherylann Ratellateef  5. Erectile dysfunction due to type 2 diabetes mellitus (HCC)   I discussed the assessment and treatment plan with the patient. The patient was provided an opportunity to ask questions and all were answered. The patient agreed with the plan and demonstrated an understanding of the instructions.  The patient was advised to call back or seek an in-person evaluation if the symptoms worsen or if the condition fails to improve as anticipated.  I provided 25  minutes of non-face-to-face time during this encounter.

## 2018-12-13 ENCOUNTER — Encounter: Payer: BLUE CROSS/BLUE SHIELD | Admitting: Student in an Organized Health Care Education/Training Program

## 2018-12-14 ENCOUNTER — Encounter: Payer: Self-pay | Admitting: Student in an Organized Health Care Education/Training Program

## 2018-12-15 ENCOUNTER — Ambulatory Visit
Payer: BLUE CROSS/BLUE SHIELD | Attending: Student in an Organized Health Care Education/Training Program | Admitting: Student in an Organized Health Care Education/Training Program

## 2018-12-15 ENCOUNTER — Other Ambulatory Visit: Payer: Self-pay

## 2018-12-15 ENCOUNTER — Encounter: Payer: Self-pay | Admitting: Student in an Organized Health Care Education/Training Program

## 2018-12-15 DIAGNOSIS — G8929 Other chronic pain: Secondary | ICD-10-CM

## 2018-12-15 DIAGNOSIS — M5136 Other intervertebral disc degeneration, lumbar region: Secondary | ICD-10-CM | POA: Diagnosis not present

## 2018-12-15 DIAGNOSIS — Z79891 Long term (current) use of opiate analgesic: Secondary | ICD-10-CM | POA: Diagnosis not present

## 2018-12-15 DIAGNOSIS — M47816 Spondylosis without myelopathy or radiculopathy, lumbar region: Secondary | ICD-10-CM

## 2018-12-15 DIAGNOSIS — M545 Low back pain: Secondary | ICD-10-CM

## 2018-12-15 DIAGNOSIS — Z981 Arthrodesis status: Secondary | ICD-10-CM | POA: Diagnosis not present

## 2018-12-15 MED ORDER — OXYCODONE HCL ER 40 MG PO T12A: 40.0000 mg | EXTENDED_RELEASE_TABLET | Freq: Two times a day (BID) | ORAL | 0 refills | Status: DC

## 2018-12-15 MED ORDER — OXYCODONE-ACETAMINOPHEN 10-325 MG PO TABS: 1.0000 | ORAL_TABLET | Freq: Two times a day (BID) | ORAL | 0 refills | Status: DC | PRN

## 2018-12-15 NOTE — Progress Notes (Signed)
Pain Management Virtual Encounter Note - Virtual Visit via Video Conference Telehealth (real-time audio visits between healthcare provider and patient).   Patient's Phone No. & Preferred Pharmacy:  [phone] (home); [phone] (mobile); (Preferred) [phone] [email or web address]  [facility] [phone] [place] Rough, [place] - [address] [facility] [address] [place] [address] Phone: [phone] Fax: [phone]  [facility] [address] - [place], [place] - [address] [place] [address] Phone: [phone] Fax: [phone]    Pre-screening note:  Our staff contacted Mr. Enz and offered him an "in person", "face-to-face" appointment versus a telephone encounter. He indicated preferring the telephone encounter, at this time.   Reason for Virtual Visit: COVID-19*  Social distancing based on CDC and AMA recommendations.   I contacted Lillia Pauls. on 12/15/2018 via video conference.      I clearly identified myself as Edward Jolly, MD. I verified that I was speaking with the correct person using two identifiers (Name: Mischa Broberg., and date of birth: 1956/01/31).  Advanced Informed Consent I sought verbal advanced consent from Lillia Pauls. for virtual visit interactions. I informed Mr. Jett of possible security and privacy concerns, risks, and limitations associated with providing "not-in-person" medical evaluation and management services. I also informed Mr. Beichner of the availability of "in-person" appointments. Finally, I informed him that there would be a charge for the virtual visit and that he could be  personally, fully or partially, financially responsible for it. Mr. Buchanan expressed understanding and agreed to proceed.   Historic Elements   [name]. is a 63 y.o. year old, male patient evaluated today after his last encounter by our practice on  09/12/2018. Mr. Dalsanto  has a past medical history of Arthritis and Hypertension. He also  has a past surgical history that includes Back surgery (lower back surgery x 3 ); Appendectomy (age 63); sebaceous cyst removed (Right, 15 years ago); Toe Fusion (Right); metal removed from eye (years ago); Total knee arthroplasty (Left, 08/10/2014); Total knee arthroplasty (Right, 12/21/2014); Total knee arthroplasty (Right, 12/21/14); Lumbar fusion (1999); Colonoscopy with propofol (N/A, 05/04/2017); and arthroscopic shoulder surgery (Right, 08/31/2017). Mr. Ooms has a current medication list which includes the following prescription(s): amlodipine-valsartan, aspirin ec, atorvastatin, linaclotide, metformin, omega-3 acid ethyl esters, oxycodone, oxycodone, oxycodone-acetaminophen, oxycodone-acetaminophen, sildenafil, and tamsulosin. He  reports that he quit smoking about 19 years ago. His smoking use included cigarettes. He has a 30.00 pack-year smoking history. He has never used smokeless tobacco. He reports that he does not drink alcohol or use drugs. Mr. Stockbridge has No Known Allergies.   HPI  Today, he is being contacted for medication management.   No change in medical history since last visit.  Patient's pain is at baseline.  Patient continues multimodal pain regimen as prescribed.  States that it provides pain relief and improvement in functional status.   Pharmacotherapy Assessment  Analgesic:  11/22/2018  1   09/12/2018  Oxycontin Er 40 MG Tablet  60.00  30 Bi Lat   94409   Wal (0252)   0  120.00 MME  Comm Ins   [place]  11/22/2018  1   09/12/2018  Oxycodone-Acetaminophen 10-325  60.00  30 Bi Lat   94410   Wal (0252)   0  30.00 MME  Comm Ins   [place]    Monitoring: Pharmacotherapy: No side-effects or adverse reactions reported. [place] PMP: PDMP reviewed during this encounter.  Compliance: No problems identified. Effectiveness: Clinically acceptable. Plan: Refer to "POC".  UDS:  Summary  Date Value Ref  Range Status  03/15/2018 FINAL  Final    Comment:    ==================================================================== TOXASSURE SELECT 13 (MW) ==================================================================== Test                             Result       Flag       Units Drug Present and Declared for Prescription Verification   Oxycodone                      2108         EXPECTED   ng/mg creat   Oxymorphone                    2727         EXPECTED   ng/mg creat   Noroxycodone                   4079         EXPECTED   ng/mg creat   Noroxymorphone                 1205         EXPECTED   ng/mg creat    Sources of oxycodone are scheduled prescription medications.    Oxymorphone, noroxycodone, and noroxymorphone are expected    metabolites of oxycodone. Oxymorphone is also available as a    scheduled prescription medication. ==================================================================== Test                      Result    Flag   Units      Ref Range   Creatinine              77               mg/dL      >=16>=20 ==================================================================== Declared Medications:  The flagging and interpretation on this report are based on the  following declared medications.  Unexpected results may arise from  inaccuracies in the declared medications.  **Note: The testing scope of this panel includes these medications:  Oxycodone (OxyContin)  Oxycodone (Percocet)  **Note: The testing scope of this panel does not include following  reported medications:  Acetaminophen (Percocet)  Aspirin (Aspirin 81)  Atorvastatin (Lipitor)  Lisinopril  Lubiprostone (Amitiza)  Metformin  Omega-3 Fatty Acids (Lovaza)  Tamsulosin (Flomax)  Tizanidine (Zanaflex) ==================================================================== For clinical consultation, please call [phone]. ====================================================================    Laboratory  Chemistry Profile (12 mo)  Renal: 07/28/2018: BUN 12; BUN/Creatinine Ratio NOT APPLICABLE; Creat 0.75  Lab Results  Component Value Date   GFRAA 114 07/28/2018   GFRNONAA 98 07/28/2018   Hepatic: No results found for requested labs within last 8760 hours. Lab Results  Component Value Date   AST 18 07/28/2018   ALT 20 07/28/2018   Other: No results found for requested labs within last 8760 hours. Note: Above Lab results reviewed.   Assessment  The primary encounter diagnosis was S/P lumbar fusion. Diagnoses of Chronic right-sided low back pain without sciatica, Lumbar degenerative disc disease, Long term prescription opiate use, Chronic use of opiate for therapeutic purpose, Lumbar spondylosis, and Long term current use of opiate analgesic were also pertinent to this visit.  Plan of Care  I have changed Hazel SamsMarshall R. Hampe Jr.'s oxyCODONE-acetaminophen and oxyCODONE. I am  also having him start on oxyCODONE and oxyCODONE-acetaminophen. Additionally, I am having him maintain his aspirin EC, linaclotide, metFORMIN, omega-3 acid ethyl esters, tamsulosin, atorvastatin, sildenafil, and amLODipine-valsartan.  Continue Linaclotide prn constipation. Future considerations: adding Cymbalta, TCA, patient want's to avoid "psych" meds he states even though I've informed him these can be effective for neuropathic pain. 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 Of note, this patient transferred to me from his PCP who was moving. He was previously on OxyContin 40 mg twice daily and Percocet 10 mg 3 times daily.  Since he has established at our clinic, we have weaned his Percocet to twice daily.  Will hopefully continue weaning in future.  Pharmacotherapy (Medications Ordered): Meds ordered this encounter  Medications  . oxyCODONE-acetaminophen (PERCOCET) 10-325 MG tablet    Sig: Take 1 tablet by mouth 2 (two) times daily as needed for pain.    Dispense:   60 tablet    Refill:  0  . oxyCODONE (OXYCONTIN) 40 mg 12 hr tablet    Sig: Take 1 tablet (40 mg total) by mouth every 12 (twelve) hours.    Dispense:  60 tablet    Refill:  0    Do not place this medication, or any other prescription from our practice, on "Automatic Refill". Patient may have prescription filled one day early if pharmacy is closed on scheduled refill date.  Marland Kitchen oxyCODONE (OXYCONTIN) 40 mg 12 hr tablet    Sig: Take 1 tablet (40 mg total) by mouth every 12 (twelve) hours.    Dispense:  60 tablet    Refill:  0    Do not place this medication, or any other prescription from our practice, on "Automatic Refill". Patient may have prescription filled one day early if pharmacy is closed on scheduled refill date.  Marland Kitchen oxyCODONE-acetaminophen (PERCOCET) 10-325 MG tablet    Sig: Take 1 tablet by mouth 2 (two) times daily as needed for pain.    Dispense:  60 tablet    Refill:  0    Follow-up plan:   Return in about 9 weeks (around 02/16/2019) for Medication Management, in person (needs UDS).    Recent Visits No visits were found meeting these conditions.  Showing recent visits within past 90 days and meeting all other requirements   Today's Visits Date Type Provider Dept  12/15/18 Office Visit Gillis Santa, MD [facility]  Showing today's visits and meeting all other requirements   Future Appointments No visits were found meeting these conditions.  Showing future appointments within next 90 days and meeting all other requirements   I discussed the assessment and treatment plan with the patient. The patient was provided an opportunity to ask questions and all were answered. The patient agreed with the plan and demonstrated an understanding of the instructions.  Patient advised to call back or seek an in-person evaluation if the symptoms or condition worsens.  Total duration of non-face-to-face encounter: 46minutes.  Note by: Gillis Santa, MD Date: 12/15/2018; Time:  1:12 PM  Note: This dictation was prepared with Dragon dictation. Any transcriptional errors that may result from this process are unintentional.  Disclaimer:  * Given the special circumstances of the COVID-19 pandemic, the federal government has announced that the Office for Civil Rights (OCR) will exercise its enforcement discretion and will not impose penalties on physicians using telehealth in the event of noncompliance with regulatory requirements under the [place] (  HIPAA) in connection with the good faith provision of telehealth during the EBVPL-68 national public health emergency. (AMA)

## 2018-12-19 ENCOUNTER — Encounter: Payer: BLUE CROSS/BLUE SHIELD | Admitting: Student in an Organized Health Care Education/Training Program

## 2018-12-22 ENCOUNTER — Other Ambulatory Visit: Payer: Self-pay | Admitting: Student in an Organized Health Care Education/Training Program

## 2018-12-22 DIAGNOSIS — M62830 Muscle spasm of back: Secondary | ICD-10-CM

## 2019-01-31 ENCOUNTER — Encounter: Payer: Self-pay | Admitting: Family Medicine

## 2019-01-31 ENCOUNTER — Ambulatory Visit (INDEPENDENT_AMBULATORY_CARE_PROVIDER_SITE_OTHER): Payer: BLUE CROSS/BLUE SHIELD | Admitting: Family Medicine

## 2019-01-31 ENCOUNTER — Other Ambulatory Visit: Payer: Self-pay

## 2019-01-31 DIAGNOSIS — E1169 Type 2 diabetes mellitus with other specified complication: Secondary | ICD-10-CM

## 2019-01-31 DIAGNOSIS — G8929 Other chronic pain: Secondary | ICD-10-CM

## 2019-01-31 DIAGNOSIS — E785 Hyperlipidemia, unspecified: Secondary | ICD-10-CM

## 2019-01-31 DIAGNOSIS — N401 Enlarged prostate with lower urinary tract symptoms: Secondary | ICD-10-CM | POA: Diagnosis not present

## 2019-01-31 DIAGNOSIS — M545 Low back pain: Secondary | ICD-10-CM

## 2019-01-31 DIAGNOSIS — K5903 Drug induced constipation: Secondary | ICD-10-CM

## 2019-01-31 DIAGNOSIS — F1120 Opioid dependence, uncomplicated: Secondary | ICD-10-CM

## 2019-01-31 DIAGNOSIS — R42 Dizziness and giddiness: Secondary | ICD-10-CM

## 2019-01-31 DIAGNOSIS — G894 Chronic pain syndrome: Secondary | ICD-10-CM

## 2019-01-31 DIAGNOSIS — N521 Erectile dysfunction due to diseases classified elsewhere: Secondary | ICD-10-CM

## 2019-01-31 DIAGNOSIS — T402X5A Adverse effect of other opioids, initial encounter: Secondary | ICD-10-CM

## 2019-01-31 DIAGNOSIS — R351 Nocturia: Secondary | ICD-10-CM

## 2019-01-31 DIAGNOSIS — I10 Essential (primary) hypertension: Secondary | ICD-10-CM

## 2019-01-31 LAB — POCT GLYCOSYLATED HEMOGLOBIN (HGB A1C): HbA1c, POC (controlled diabetic range): 5.8 % (ref 0.0–7.0)

## 2019-01-31 MED ORDER — TAMSULOSIN HCL 0.4 MG PO CAPS: 0.4000 mg | ORAL_CAPSULE | Freq: Every day | ORAL | 1 refills | Status: DC

## 2019-01-31 MED ORDER — METFORMIN HCL ER 750 MG PO TB24: 1500.0000 mg | ORAL_TABLET | Freq: Every day | ORAL | 1 refills | Status: DC

## 2019-01-31 MED ORDER — ATORVASTATIN CALCIUM 20 MG PO TABS: 20.0000 mg | ORAL_TABLET | Freq: Every day | ORAL | 1 refills | Status: DC

## 2019-01-31 MED ORDER — AMLODIPINE BESYLATE-VALSARTAN 5-320 MG PO TABS: 1.0000 | ORAL_TABLET | Freq: Every day | ORAL | 1 refills | Status: DC

## 2019-01-31 NOTE — Patient Instructions (Signed)

## 2019-01-31 NOTE — Progress Notes (Signed)
Name: Lawrence Thornton.   MRN: [id number]    DOB: 03-06-1956   Date:01/31/2019       Progress Note  Subjective  Chief Complaint  Chief Complaint  Patient presents with  . Medication Refill  . Dyslipidemia  . Diabetes  . Pain  . Morbid Obesity  . Benign Prostatic Hypertrophy  . Hypertension    HPI  Vertigo: he states had 4 different episodes since this Summer. The longest one was 4 days long in August, other episodes one day in duration. He states episodes usually starts in the morning, associated with dizziness, last episode with nausea but no vomiting. . It affects his gait. He also states cannot drive.  He states he has chronic tinnitus that is worse on left side. He has also noticed some hearing loss. Discussed referral to ENT but he wants to hold off for now   DMII:diagnosed May 2019 with a hgb6.9% He denies polydipsia ( but states likes water), no polyphagia, has nocturia from BPH.Discussed yearly eye exam and he will schedule it, urine microwas negative. Denies blurred vision. He has high triglycerides andhe wasgemfibrozil, but we switched from gemfibrozil to Lovaza 2 at night because that is how he can tolerate and also Atorvastatin daily. He has Viagra and takes it prn only.   Chronic pain and opioid use: he was given Linzessand only takes it prn.He states bowel movements are now daily, but states needs to take medication occasionally to help him have a bowel movement. He is under the care of [facility] and Dr. Holley Raring, pain level now is 3/10 right lower back. He had a recent fall at work, tripped on a Engineer, manufacturing. He states fell on right thigh and is still a little sore  Morbid obesity: BMI above 35 He has multiple co-morbidities: DM, HTN, hyperlipidemia. He has gained some weight since last visit   JOA:CZYSAYT did not work, Research scientist (physical sciences), he states nocturia about twice per night, but sometimes still 4 times per night. Discussed not drinking water after 6 pm    HTN:bp is at goal, no chest pain, palpitation or SOB. Tolerating new medication well   Patient Active Problem List   Diagnosis Date Noted  . Chronic use of opiate for therapeutic purpose 12/15/2018  . Morbid obesity ([place]) 07/28/2018  . Lumbar spondylosis 03/15/2018  . Long term current use of opiate analgesic 12/15/2017  . Dyslipidemia associated with type 2 diabetes mellitus ([place]) 08/13/2017  . Osteoarthritis of acromioclavicular joint 07/30/2017  . S/P lumbar fusion 07/13/2017  . Chronic pain syndrome 07/13/2017  . Personal history of colonic polyps   . Constipation due to opioid therapy 07/18/2015  . Hypertriglyceridemia 01/22/2015  . Extreme obesity 01/22/2015  . S/P knee replacement 12/21/2014  . Chronic right-sided low back pain without sciatica 09/21/2014  . BPH associated with nocturia 09/21/2014  . Dyslipidemia 09/21/2014  . Continuous opioid dependence ([place]) 09/21/2014  . Lumbar degenerative disc disease 09/21/2014  . Failure of erection 09/21/2014    Past Surgical History:  Procedure Laterality Date  . APPENDECTOMY  age 22  . arthroscopic shoulder surgery Right 08/31/2017  . BACK SURGERY  lower back surgery x 3    L 3 L 4 and L 5 are fused together  . COLONOSCOPY WITH PROPOFOL N/A 05/04/2017   Procedure: COLONOSCOPY WITH PROPOFOL;  Surgeon: Lucilla Lame, MD;  Location: [facility] ENDOSCOPY;  Service: Endoscopy;  Laterality: N/A;  . LUMBAR FUSION  1999   L- to L5  . metal  removed from eye  years ago  . sebaceous cyst removed Right 15 years ago   middle  finger   . TOE FUSION Right    4th toe  . TOTAL KNEE ARTHROPLASTY Left 08/10/2014   Procedure: LEFT TOTAL KNEE ARTHROPLASTY;  Surgeon: Eugenia Mcalpineobert Collins, MD;  Location: [facility] ORS;  Service: Orthopedics;  Laterality: Left;  . TOTAL KNEE ARTHROPLASTY Right 12/21/2014   Procedure: RIGHT TOTAL KNEE ARTHROPLASTY;  Surgeon: Eugenia Mcalpineobert Collins, MD;  Location: [facility] ORS;  Service: Orthopedics;  Laterality: Right;  . TOTAL KNEE  ARTHROPLASTY Right 12/21/14    Family History  Problem Relation Age of Onset  . Cancer Mother        Throat  . Heart attack Mother   . Hypertension Father   . Hyperlipidemia Father     Social History   Socioeconomic History  . Marital status: Married    Spouse name: Lawrence Thornton  . Number of children: 0  . Years of education: Not on file  . Highest education level: Not on file  Occupational History  . Occupation: mill write work   Engineer, productionocial Needs  . Financial resource strain: Somewhat hard  . Food insecurity    Worry: Never true    Inability: Never true  . Transportation needs    Medical: No    Non-medical: No  Tobacco Use  . Smoking status: Former Smoker    Packs/day: 1.50    Years: 20.00    Pack years: 30.00    Types: Cigarettes    Quit date: 04/07/1999    Years since quitting: 19.8  . Smokeless tobacco: Never Used  . Tobacco comment: not smoking   Substance and Sexual Activity  . Alcohol use: No  . Drug use: No  . Sexual activity: Yes    Birth control/protection: Condom  Lifestyle  . Physical activity    Days per week: 7 days    Minutes per session: 30 min  . Stress: To some extent  Relationships  . Social connections    Talks on phone: More than three times a week    Gets together: More than three times a week    Attends religious service: Never    Active member of club or organization: No    Attends meetings of clubs or organizations: Never    Relationship status: Married  . Intimate partner violence    Fear of current or ex partner: No    Emotionally abused: No    Physically abused: No    Forced sexual activity: No  Other Topics Concern  . Not on file  Social History Narrative  . Not on file     Current Outpatient Medications:  .  amLODipine-valsartan (EXFORGE) 5-320 MG tablet, Take 1 tablet by mouth daily., Disp: 90 tablet, Rfl: 0 .  amoxicillin (AMOXIL) 500 MG capsule, TK 4 CS PO 1 HOUR BEFORE APPOINTMENT, Disp: , Rfl:  .  aspirin EC 81 MG tablet,  Take 1 tablet (81 mg total) by mouth daily., Disp: 30 tablet, Rfl: 0 .  atorvastatin (LIPITOR) 20 MG tablet, Take 1 tablet (20 mg total) by mouth daily., Disp: 90 tablet, Rfl: 1 .  azithromycin (ZITHROMAX) 250 MG tablet, TK 2 TS PO ON DAY 1 THEN TK 1 T QD FOR 4 DAYS, Disp: , Rfl:  .  chlorhexidine (PERIDEX) 0.12 % solution, 15 mLs 2 (two) times daily., Disp: , Rfl:  .  linaclotide (LINZESS) 145 MCG CAPS capsule, Take 1 capsule (145 mcg total) by mouth daily  before breakfast., Disp: 90 capsule, Rfl: 1 .  metFORMIN (GLUCOPHAGE-XR) 750 MG 24 hr tablet, Take 2 tablets (1,500 mg total) by mouth daily with breakfast., Disp: 180 tablet, Rfl: 1 .  omega-3 acid ethyl esters (LOVAZA) 1 g capsule, Take 2 capsules (2 g total) by mouth 2 (two) times daily., Disp: 360 capsule, Rfl: 1 .  oxyCODONE (OXYCONTIN) 40 mg 12 hr tablet, Take 1 tablet (40 mg total) by mouth every 12 (twelve) hours., Disp: 60 tablet, Rfl: 0 .  oxyCODONE-acetaminophen (PERCOCET) 10-325 MG tablet, Take 1 tablet by mouth 2 (two) times daily as needed for pain., Disp: 60 tablet, Rfl: 0 .  sildenafil (VIAGRA) 100 MG tablet, Take 1 tablet (100 mg total) by mouth daily as needed for erectile dysfunction., Disp: 30 tablet, Rfl: 0 .  tamsulosin (FLOMAX) 0.4 MG CAPS capsule, Take 1 capsule (0.4 mg total) by mouth daily., Disp: 90 capsule, Rfl: 1  No Known Allergies  I personally reviewed active problem list, medication list, allergies, family history, social history, health maintenance with the patient/caregiver today.   ROS  Constitutional: Negative for fever , positive for weight change.  Respiratory: Negative for cough and shortness of breath.   Cardiovascular: Negative for chest pain or palpitations.  Gastrointestinal: Negative for abdominal pain, no bowel changes.  Musculoskeletal: Negative for gait problem or joint swelling.  Skin: Negative for rash.  Neurological: Negative for dizziness or headache.  No other specific complaints in a  complete review of systems (except as listed in HPI above).  Objective  Vitals:   01/31/19 0812  BP: 126/68  Pulse: 68  Resp: 16  Temp: (!) 97.5 F (36.4 C)  TempSrc: Temporal  SpO2: 99%  Weight: 276 lb 1.6 oz (125.2 kg)  Height: 6' (1.829 m)    Body mass index is 37.45 kg/m.  Physical Exam  Constitutional: Patient appears well-developed and well-nourished. Obese  No distress.  HEENT: head atraumatic, normocephalic, pupils equal and reactive to light Cardiovascular: Normal rate, regular rhythm and normal heart sounds.  No murmur heard. No BLE edema. Pulmonary/Chest: Effort normal and breath sounds normal. No respiratory distress. Abdominal: Soft.  There is no tenderness. Psychiatric: Patient has a normal mood and affect. behavior is normal. Judgment and thought content normal.  Recent Results (from the past 2160 hour(s))  POCT HgB A1C     Status: Normal   Collection Time: 01/31/19  8:19 AM  Result Value Ref Range   Hemoglobin A1C     HbA1c POC (<> result, manual entry)     HbA1c, POC (prediabetic range)     HbA1c, POC (controlled diabetic range) 5.8 0.0 - 7.0 %    Diabetic Foot Exam: Diabetic Foot Exam - Simple   Simple Foot Form Diabetic Foot exam was performed with the following findings: Yes 01/31/2019  9:00 AM  Visual Inspection No deformities, no ulcerations, no other skin breakdown bilaterally: Yes Sensation Testing Intact to touch and monofilament testing bilaterally: Yes Pulse Check Posterior Tibialis and Dorsalis pulse intact bilaterally: Yes Comments      PHQ2/9: Depression screen [facility] 2/9 01/31/2019 11/02/2018 07/28/2018 03/18/2018 11/15/2017  Decreased Interest 0 0 0 0 0  Down, Depressed, Hopeless 0 0 0 0 0  PHQ - 2 Score 0 0 0 0 0  Altered sleeping 1 0 0 0 0  Tired, decreased energy 0 0 0 0 0  Change in appetite 0 0 0 0 0  Feeling bad or failure about yourself  0 0 0 0 0  Trouble concentrating 0 0 0 0 0  Moving slowly or fidgety/restless 0 0 0  0 0  Suicidal thoughts 0 0 0 0 0  PHQ-9 Score 1 0 0 0 0  Difficult doing work/chores Not difficult at all Not difficult at all Not difficult at all Not difficult at all Not difficult at all  Some recent data might be hidden    phq 9 is negative   Fall Risk: Fall Risk  01/31/2019 11/02/2018 07/28/2018 06/13/2018 06/13/2018  Falls in the past year? 1 0 1 0 0  Number falls in past yr: 0 0 0 - -  Injury with Fall? 0 0 0 - -  Follow up - - Falls evaluation completed - -     Functional Status Survey: Is the patient deaf or have difficulty hearing?: No Does the patient have difficulty seeing, even when wearing glasses/contacts?: Yes Does the patient have difficulty concentrating, remembering, or making decisions?: No Does the patient have difficulty walking or climbing stairs?: No Does the patient have difficulty dressing or bathing?: No Does the patient have difficulty doing errands alone such as visiting a doctor's office or shopping?: No    Assessment & Plan  1. Dyslipidemia associated with type 2 diabetes mellitus (HCC)  - POCT HgB A1C - COMPLETE METABOLIC PANEL WITH GFR  2. BPH associated with nocturia  - PSA  3. Chronic pain syndrome  stable  4. Continuous opioid dependence (HCC)  Seeing Dr. Cherylann Ratel   5. Essential hypertension  bp is at goal  6. Erectile dysfunction due to type 2 diabetes mellitus (HCC)   7. Morbid obesity (HCC)  Discussed with the patient the risk posed by an increased BMI. Discussed importance of portion control, calorie counting and at least 150 minutes of physical activity weekly. Avoid sweet beverages and drink more water. Eat at least 6 servings of fruit and vegetables daily   8. Dyslipidemia  On statin and lovaza  9. Chronic right-sided low back pain without sciatica  Continue follow up with Dr. Julious Payer   10. Constipation due to opioid therapy  Stable on prn LInzess  11. Vertigo  Discussed epley maneuver

## 2019-02-01 LAB — COMPLETE METABOLIC PANEL WITH GFR
AG Ratio: 1.6 (calc) (ref 1.0–2.5)
ALT: 36 U/L (ref 9–46)
AST: 26 U/L (ref 10–35)
Albumin: 4.6 g/dL (ref 3.6–5.1)
Alkaline phosphatase (APISO): 77 U/L (ref 35–144)
BUN: 11 mg/dL (ref 7–25)
CO2: 28 mmol/L (ref 20–32)
Calcium: 9.9 mg/dL (ref 8.6–10.3)
Chloride: 101 mmol/L (ref 98–110)
Creat: 0.8 mg/dL (ref 0.70–1.25)
GFR, Est African American: 111 mL/min/{1.73_m2} (ref >=60)
GFR, Est Non African American: 96 mL/min/{1.73_m2} (ref >=60)
Globulin: 2.8 g/dL (calc) (ref 1.9–3.7)
Glucose, Bld: 109 mg/dL — ABNORMAL HIGH (ref 65–99)
Potassium: 4.4 mmol/L (ref 3.5–5.3)
Sodium: 139 mmol/L (ref 135–146)
Total Bilirubin: 0.5 mg/dL (ref 0.2–1.2)
Total Protein: 7.4 g/dL (ref 6.1–8.1)

## 2019-02-01 LAB — PSA: PSA: 0.2 ng/mL (ref <=4.0)

## 2019-02-14 ENCOUNTER — Encounter: Payer: Self-pay | Admitting: Student in an Organized Health Care Education/Training Program

## 2019-02-14 ENCOUNTER — Other Ambulatory Visit: Payer: Self-pay

## 2019-02-14 ENCOUNTER — Ambulatory Visit
Payer: BLUE CROSS/BLUE SHIELD | Attending: Student in an Organized Health Care Education/Training Program | Admitting: Student in an Organized Health Care Education/Training Program

## 2019-02-14 VITALS — BP 156/94 | HR 83 | Temp 98.2°F | Ht 72.0 in | Wt 270.0 lb

## 2019-02-14 DIAGNOSIS — M5136 Other intervertebral disc degeneration, lumbar region: Secondary | ICD-10-CM

## 2019-02-14 DIAGNOSIS — G894 Chronic pain syndrome: Secondary | ICD-10-CM | POA: Diagnosis not present

## 2019-02-14 DIAGNOSIS — Z981 Arthrodesis status: Secondary | ICD-10-CM | POA: Diagnosis not present

## 2019-02-14 DIAGNOSIS — M545 Low back pain, unspecified: Secondary | ICD-10-CM

## 2019-02-14 DIAGNOSIS — M47816 Spondylosis without myelopathy or radiculopathy, lumbar region: Secondary | ICD-10-CM

## 2019-02-14 DIAGNOSIS — Z79891 Long term (current) use of opiate analgesic: Secondary | ICD-10-CM

## 2019-02-14 DIAGNOSIS — G8929 Other chronic pain: Secondary | ICD-10-CM

## 2019-02-14 MED ORDER — OXYCODONE-ACETAMINOPHEN 10-325 MG PO TABS: 1.0000 | ORAL_TABLET | Freq: Two times a day (BID) | ORAL | 0 refills | Status: DC | PRN

## 2019-02-14 MED ORDER — TIZANIDINE HCL 4 MG PO TABS: 4.0000 mg | ORAL_TABLET | Freq: Two times a day (BID) | ORAL | 2 refills | Status: DC | PRN

## 2019-02-14 MED ORDER — OXYCODONE HCL ER 40 MG PO T12A: 40.0000 mg | EXTENDED_RELEASE_TABLET | Freq: Two times a day (BID) | ORAL | 0 refills | Status: DC

## 2019-02-14 NOTE — Progress Notes (Signed)
Patient's Name: Lawrence Thornton.  MRN: [id number]  Referring Provider: Steele Sizer, MD  DOB: 11-07-1955  PCP: Steele Sizer, MD  DOS: 02/14/2019  Note by: Gillis Santa, MD  Service setting: Ambulatory outpatient  Attending: Gillis Santa, MD  Location: [facility] (AMB) Pain Management Facility  Specialty: Interventional Pain Management  Patient type: Established   Primary Reason(s) for Visit: Encounter for prescription drug management. (Level of risk: moderate)  CC: Back Pain  HPI  Lawrence Thornton is a 63 y.o. year old, male patient, who comes today for a medication management evaluation. He has Chronic right-sided low back pain without sciatica; BPH associated with nocturia; Dyslipidemia; Continuous opioid dependence ([place]); Lumbar degenerative disc disease; Failure of erection; S/P knee replacement; Hypertriglyceridemia; Extreme obesity; Constipation due to opioid therapy; Personal history of colonic polyps; S/P lumbar fusion; Chronic pain syndrome; Osteoarthritis of acromioclavicular joint; Dyslipidemia associated with type 2 diabetes mellitus ([place]); Long term current use of opiate analgesic; Lumbar spondylosis; Morbid obesity ([place]); and Long term prescription opiate use on their problem list. His primarily concern today is the Back Pain  Pain Assessment: Location: Lower Back Radiating: Denies Onset: More than a month ago Duration: Chronic pain Quality: Aching Severity: 3 /10 (subjective, self-reported pain score)  Note: Reported level is compatible with observation.                         When using our objective Pain Scale, levels between 6 and 10/10 are said to belong in an emergency room, as it progressively worsens from a 6/10, described as severely limiting, requiring emergency care not usually available at an outpatient pain management facility. At a 6/10 level, communication becomes difficult and requires great effort. Assistance to reach the emergency department may be required.  Facial flushing and profuse sweating along with potentially dangerous increases in heart rate and blood pressure will be evident. Effect on ADL: limits my daily activities Timing: Constant Modifying factors: Meds, exercise BP: (!) 156/94  HR: 83  Lawrence Thornton was last scheduled for an appointment on 12/22/2018 for medication management. During today's appointment we reviewed Lawrence Thornton's chronic pain status, as well as his outpatient medication regimen.  The patient  reports no history of drug use. His body mass index is 36.62 kg/m.  Further details on both, my assessment(s), as well as the proposed treatment plan, please see below.  Controlled Substance Pharmacotherapy Assessment REMS (Risk Evaluation and Mitigation Strategy)  Analgesic: /17/2020  1   12/15/2018  Oxycodone-Acetaminophen 10-325  60.00  30 Bi Lat   101438   Wal (0252)   0  30.00 MME  Comm Ins   [place]  01/21/2019  1   12/15/2018  Oxycontin Er 40 MG Tablet  60.00  30 Bi Lat   [id number]   Wal (0252)   0  120.00 MME  Comm Ins        Chauncey [name], RN  02/14/2019 10:25 AM  Sign when Signing Visit Safety precautions to be maintained throughout the outpatient stay will include: orient to surroundings, keep bed in low position, maintain call bell within reach at all times, provide assistance with transfer out of bed and ambulation.   Chauncey [name], RN  02/14/2019 10:22 AM  Sign when Signing Visit Nursing Pain Medication Assessment:  Safety precautions to be maintained throughout the outpatient stay will include: orient to surroundings, keep bed in low position, maintain call bell within reach at all times, provide assistance with transfer  out of bed and ambulation.  Medication Inspection Compliance: Pill count conducted under aseptic conditions, in front of the patient. Neither the pills nor the bottle was removed from the patient's sight at any time. Once count was completed pills were immediately returned to the patient in  their original bottle.  Medication #1: Oxycodone/APAP Pill/Patch Count: 15 of 60 pills remain Pill/Patch Appearance: Markings consistent with prescribed medication Bottle Appearance: Standard pharmacy container. Clearly labeled. Filled Date: 40 / 17 / 2020 Last Medication intake:  Today  Medication #2: Oxycodone ER (OxyContin) Pill/Patch Count: 15 of 60 pills remain Pill/Patch Appearance: Markings consistent with prescribed medication Bottle Appearance: Standard pharmacy container. Clearly labeled. Filled Date: 77 / 17 / 2020 Last Medication intake:  Today   Pharmacokinetics: Liberation and absorption (onset of action): WNL Distribution (time to peak effect): WNL Metabolism and excretion (duration of action): WNL         Pharmacodynamics: Desired effects: Analgesia: Lawrence Thornton reports >50% benefit. Functional ability: Patient reports that medication allows him to accomplish basic ADLs Clinically meaningful improvement in function (CMIF): Sustained CMIF goals met Perceived effectiveness: Described as relatively effective, allowing for increase in activities of daily living (ADL) Undesirable effects: Side-effects or Adverse reactions: None reported Monitoring: [place] PMP: PDMP not reviewed this encounter. Online review of the past 45-monthperiod conducted. Compliant with practice rules and regulations Last UDS on record: Summary  Date Value Ref Range Status  03/15/2018 FINAL  Final    Comment:    ==================================================================== TOXASSURE SELECT 13 (MW) ==================================================================== Test                             Result       Flag       Units Drug Present and Declared for Prescription Verification   Oxycodone                      2108         EXPECTED   ng/mg creat   Oxymorphone                    2727         EXPECTED   ng/mg creat   Noroxycodone                   4079         EXPECTED   ng/mg creat    Noroxymorphone                 1205         EXPECTED   ng/mg creat    Sources of oxycodone are scheduled prescription medications.    Oxymorphone, noroxycodone, and noroxymorphone are expected    metabolites of oxycodone. Oxymorphone is also available as a    scheduled prescription medication. ==================================================================== Test                      Result    Flag   Units      Ref Range   Creatinine              77               mg/dL      >=20 ==================================================================== Declared Medications:  The flagging and interpretation on this report are based on the  following declared medications.  Unexpected results may arise from  inaccuracies in the declared  medications.  **Note: The testing scope of this panel includes these medications:  Oxycodone (OxyContin)  Oxycodone (Percocet)  **Note: The testing scope of this panel does not include following  reported medications:  Acetaminophen (Percocet)  Aspirin (Aspirin 81)  Atorvastatin (Lipitor)  Lisinopril  Lubiprostone (Amitiza)  Metformin  Omega-3 Fatty Acids (Lovaza)  Tamsulosin (Flomax)  Tizanidine (Zanaflex) ==================================================================== For clinical consultation, please call [phone]. ====================================================================    UDS interpretation: Compliant          Medication Assessment Form: Reviewed. Patient indicates being compliant with therapy Treatment compliance: Compliant Risk Assessment Profile: Aberrant behavior: See initial evaluations. None observed or detected today Comorbid factors increasing risk of overdose: See initial evaluation. No additional risks detected today Opioid risk tool (ORT):  Opioid Risk  06/13/2018  Alcohol 0  Illegal Drugs 0  Rx Drugs 0  Alcohol 0  Illegal Drugs 0  Rx Drugs 0  Age between 16-45 years  0  History of Preadolescent Sexual  Abuse 0  Psychological Disease 0  Depression 0  Opioid Risk Tool Scoring 0  Opioid Risk Interpretation Low Risk    ORT Scoring interpretation table:  Score <3 = Low Risk for SUD  Score between 4-7 = Moderate Risk for SUD  Score >8 = High Risk for Opioid Abuse   Risk of substance use disorder (SUD): Low  Risk Mitigation Strategies:  Patient Counseling: Covered Patient-Prescriber Agreement (PPA): Present and active  Notification to other healthcare providers: Done  Pharmacologic Plan: No change in therapy, at this time.             Laboratory Chemistry Profile   Screening Lab Results  Component Value Date   STAPHAUREUS NEGATIVE 12/13/2014   MRSAPCR NEGATIVE 12/13/2014   HCVAB NEGATIVE 10/06/2016    Inflammation (CRP: Acute Phase) (ESR: Chronic Phase) No results found for: CRP, ESRSEDRATE, LATICACIDVEN                       Rheumatology No results found for: RF, ANA, LABURIC, URICUR, LYMEIGGIGMAB, LYMEABIGMQN, HLAB27                      Renal Lab Results  Component Value Date   BUN 11 01/31/2019   CREATININE 0.80 01/31/2019   LABCREA 45 11/91/4782   BCR NOT APPLICABLE 95/62/1308   GFRAA 111 01/31/2019   GFRNONAA 96 01/31/2019                             Hepatic Lab Results  Component Value Date   AST 26 01/31/2019   ALT 36 01/31/2019   ALBUMIN 4.6 11/11/2015   ALKPHOS 76 11/11/2015   HCVAB NEGATIVE 10/06/2016                        Electrolytes Lab Results  Component Value Date   NA 139 01/31/2019   K 4.4 01/31/2019   CL 101 01/31/2019   CALCIUM 9.9 01/31/2019                        Neuropathy Lab Results  Component Value Date   HGBA1C 5.8 01/31/2019                        CNS No results found for: COLORCSF, APPEARCSF, RBCCOUNTCSF, WBCCSF, POLYSCSF, LYMPHSCSF, EOSCSF, PROTEINCSF, GLUCCSF, JCVIRUS, CSFOLI, IGGCSF, LABACHR, ACETBL  Bone Lab Results  Component Value Date   VD25OH 43 10/06/2016                          Coagulation Lab Results  Component Value Date   INR 1.07 12/13/2014   LABPROT 14.1 12/13/2014   APTT 34 12/13/2014   PLT 297 07/28/2018                        Cardiovascular Lab Results  Component Value Date   HGB 14.2 07/28/2018   HCT 41.2 07/28/2018                         ID Lab Results  Component Value Date   STAPHAUREUS NEGATIVE 12/13/2014   MRSAPCR NEGATIVE 12/13/2014   HCVAB NEGATIVE 10/06/2016    Cancer No results found for: CEA, CA125, LABCA2                      Endocrine Lab Results  Component Value Date   TSH 0.65 10/06/2016                        Note: Lab results reviewed.   Meds   Current Outpatient Medications:  .  amLODipine-valsartan (EXFORGE) 5-320 MG tablet, Take 1 tablet by mouth daily., Disp: 90 tablet, Rfl: 1 .  aspirin EC 81 MG tablet, Take 1 tablet (81 mg total) by mouth daily., Disp: 30 tablet, Rfl: 0 .  atorvastatin (LIPITOR) 20 MG tablet, Take 1 tablet (20 mg total) by mouth daily., Disp: 90 tablet, Rfl: 1 .  chlorhexidine (PERIDEX) 0.12 % solution, 15 mLs 2 (two) times daily., Disp: , Rfl:  .  linaclotide (LINZESS) 145 MCG CAPS capsule, Take 1 capsule (145 mcg total) by mouth daily before breakfast., Disp: 90 capsule, Rfl: 1 .  metFORMIN (GLUCOPHAGE-XR) 750 MG 24 hr tablet, Take 2 tablets (1,500 mg total) by mouth daily with breakfast., Disp: 180 tablet, Rfl: 1 .  omega-3 acid ethyl esters (LOVAZA) 1 g capsule, Take 2 capsules (2 g total) by mouth 2 (two) times daily., Disp: 360 capsule, Rfl: 1 .  [START ON 02/20/2019] oxyCODONE (OXYCONTIN) 40 mg 12 hr tablet, Take 1 tablet (40 mg total) by mouth every 12 (twelve) hours., Disp: 60 tablet, Rfl: 0 .  [START ON 03/22/2019] oxyCODONE (OXYCONTIN) 40 mg 12 hr tablet, Take 1 tablet (40 mg total) by mouth every 12 (twelve) hours., Disp: 60 tablet, Rfl: 0 .  [START ON 04/21/2019] oxyCODONE (OXYCONTIN) 40 mg 12 hr tablet, Take 1 tablet (40 mg total) by mouth every 12 (twelve) hours., Disp: 60  tablet, Rfl: 0 .  [START ON 02/20/2019] oxyCODONE-acetaminophen (PERCOCET) 10-325 MG tablet, Take 1 tablet by mouth 2 (two) times daily as needed for pain., Disp: 60 tablet, Rfl: 0 .  [START ON 03/22/2019] oxyCODONE-acetaminophen (PERCOCET) 10-325 MG tablet, Take 1 tablet by mouth 2 (two) times daily as needed for pain., Disp: 60 tablet, Rfl: 0 .  [START ON 04/21/2019] oxyCODONE-acetaminophen (PERCOCET) 10-325 MG tablet, Take 1 tablet by mouth 2 (two) times daily as needed for pain., Disp: 60 tablet, Rfl: 0 .  sildenafil (VIAGRA) 100 MG tablet, Take 1 tablet (100 mg total) by mouth daily as needed for erectile dysfunction., Disp: 30 tablet, Rfl: 0 .  tamsulosin (FLOMAX) 0.4 MG CAPS capsule, Take 1 capsule (0.4 mg total) by mouth daily., Disp: 90  capsule, Rfl: 1 .  amoxicillin (AMOXIL) 500 MG capsule, TK 4 CS PO 1 HOUR BEFORE APPOINTMENT, Disp: , Rfl:  .  azithromycin (ZITHROMAX) 250 MG tablet, TK 2 TS PO ON DAY 1 THEN TK 1 T QD FOR 4 DAYS, Disp: , Rfl:  .  tiZANidine (ZANAFLEX) 4 MG tablet, Take 1 tablet (4 mg total) by mouth 2 (two) times daily as needed for muscle spasms., Disp: 60 tablet, Rfl: 2  ROS  Constitutional: Denies any fever or chills Gastrointestinal: No reported hemesis, hematochezia, vomiting, or acute GI distress Musculoskeletal: Denies any acute onset joint swelling, redness, loss of ROM, or weakness Neurological: No reported episodes of acute onset apraxia, aphasia, dysarthria, agnosia, amnesia, paralysis, loss of coordination, or loss of consciousness  Allergies  Lawrence Thornton has No Known Allergies.  PFSH  Drug: Lawrence Thornton  reports no history of drug use. Alcohol:  reports no history of alcohol use. Tobacco:  reports that he quit smoking about 19 years ago. His smoking use included cigarettes. He has a 30.00 pack-year smoking history. He has never used smokeless tobacco. Medical:  has a past medical history of Arthritis and Hypertension. Surgical: Lawrence Thornton  has a past  surgical history that includes Back surgery (lower back surgery x 3 ); Appendectomy (age 64); sebaceous cyst removed (Right, 15 years ago); Toe Fusion (Right); metal removed from eye (years ago); Total knee arthroplasty (Left, 08/10/2014); Total knee arthroplasty (Right, 12/21/2014); Total knee arthroplasty (Right, 12/21/14); Lumbar fusion (1999); Colonoscopy with propofol (N/A, 05/04/2017); and arthroscopic shoulder surgery (Right, 08/31/2017). Family: family history includes Cancer in his mother; Heart attack in his mother; Hyperlipidemia in his father; Hypertension in his father.  Constitutional Exam  General appearance: Well nourished, well developed, and well hydrated. In no apparent acute distress Vitals:   02/14/19 1022  BP: (!) 156/94  Pulse: 83  Temp: 98.2 F (36.8 C)  SpO2: 99%  Weight: 270 lb (122.5 kg)  Height: 6' (1.829 m)   BMI Assessment: Estimated body mass index is 36.62 kg/m as calculated from the following:   Height as of this encounter: 6' (1.829 m).   Weight as of this encounter: 270 lb (122.5 kg).  BMI interpretation table: BMI level Category Range association with higher incidence of chronic pain  <18 kg/m2 Underweight   18.5-24.9 kg/m2 Ideal body weight   25-29.9 kg/m2 Overweight Increased incidence by 20%  30-34.9 kg/m2 Obese (Class I) Increased incidence by 68%  35-39.9 kg/m2 Severe obesity (Class II) Increased incidence by 136%  >40 kg/m2 Extreme obesity (Class III) Increased incidence by 254%   Patient's current BMI Ideal Body weight  Body mass index is 36.62 kg/m. Ideal body weight: 77.6 kg (171 lb 1.2 oz) Adjusted ideal body weight: 95.5 kg (210 lb 10.3 oz)   BMI Readings from Last 4 Encounters:  02/14/19 36.62 kg/m  01/31/19 37.45 kg/m  11/02/18 36.48 kg/m  07/28/18 36.54 kg/m   Wt Readings from Last 4 Encounters:  02/14/19 270 lb (122.5 kg)  01/31/19 276 lb 1.6 oz (125.2 kg)  11/02/18 269 lb (122 kg)  07/28/18 269 lb 6.4 oz (122.2 kg)   Psych/Mental status: Alert, oriented x 3 (person, place, & time)       Eyes: PERLA Respiratory: No evidence of acute respiratory distress  Cervical Spine Area Exam  Skin & Axial Inspection: No masses, redness, edema, swelling, or associated skin lesions Alignment: Symmetrical Functional ROM: Unrestricted ROM      Stability: No instability detected Muscle Tone/Strength: Functionally  intact. No obvious neuro-muscular anomalies detected. Sensory (Neurological): Unimpaired Palpation: No palpable anomalies              Upper Extremity (UE) Exam    Side: Right upper extremity  Side: Left upper extremity  Skin & Extremity Inspection: Skin color, temperature, and hair growth are WNL. No peripheral edema or cyanosis. No masses, redness, swelling, asymmetry, or associated skin lesions. No contractures.  Skin & Extremity Inspection: Skin color, temperature, and hair growth are WNL. No peripheral edema or cyanosis. No masses, redness, swelling, asymmetry, or associated skin lesions. No contractures.  Functional ROM: Unrestricted ROM          Functional ROM: Unrestricted ROM          Muscle Tone/Strength: Functionally intact. No obvious neuro-muscular anomalies detected.  Muscle Tone/Strength: Functionally intact. No obvious neuro-muscular anomalies detected.  Sensory (Neurological): Unimpaired          Sensory (Neurological): Unimpaired          Palpation: No palpable anomalies              Palpation: No palpable anomalies              Provocative Test(s):  Phalen's test: deferred Tinel's test: deferred Apley's scratch test (touch opposite shoulder):  Action 1 (Across chest): deferred Action 2 (Overhead): deferred Action 3 (LB reach): deferred   Provocative Test(s):  Phalen's test: deferred Tinel's test: deferred Apley's scratch test (touch opposite shoulder):  Action 1 (Across chest): deferred Action 2 (Overhead): deferred Action 3 (LB reach): deferred    Thoracic Spine Area Exam  Skin &  Axial Inspection: No masses, redness, or swelling Alignment: Symmetrical Functional ROM: Unrestricted ROM Stability: No instability detected Muscle Tone/Strength: Functionally intact. No obvious neuro-muscular anomalies detected. Sensory (Neurological): Unimpaired Muscle strength & Tone: No palpable anomalies  Lumbar Spine Area Exam  Skin & Axial Inspection: Well healed scar from previous spine surgery detected Alignment: Symmetrical Functional ROM: Mechanically restricted ROM       Stability: No instability detected Muscle Tone/Strength: Functionally intact. No obvious neuro-muscular anomalies detected. Sensory (Neurological): Musculoskeletal pain pattern and dermatomal Palpation: No palpable anomalies       Provocative Tests: Hyperextension/rotation test: (+) bilaterally for facet joint pain.   Gait & Posture Assessment  Ambulation: Unassisted Gait: Relatively normal for age and body habitus Posture: WNL   Lower Extremity Exam    Side: Right lower extremity  Side: Left lower extremity  Stability: No instability observed          Stability: No instability observed          Skin & Extremity Inspection: Skin color, temperature, and hair growth are WNL. No peripheral edema or cyanosis. No masses, redness, swelling, asymmetry, or associated skin lesions. No contractures.  Skin & Extremity Inspection: Skin color, temperature, and hair growth are WNL. No peripheral edema or cyanosis. No masses, redness, swelling, asymmetry, or associated skin lesions. No contractures.  Functional ROM: Unrestricted ROM                  Functional ROM: Unrestricted ROM                  Muscle Tone/Strength: Functionally intact. No obvious neuro-muscular anomalies detected.  Muscle Tone/Strength: Functionally intact. No obvious neuro-muscular anomalies detected.  Sensory (Neurological): Unimpaired        Sensory (Neurological): Unimpaired        DTR: Patellar: deferred today Achilles: deferred  today Plantar: deferred today  DTR: Patellar: deferred today Achilles: deferred today Plantar: deferred today  Palpation: No palpable anomalies  Palpation: No palpable anomalies   Assessment   Status Diagnosis  Controlled Controlled Controlled 1. Chronic pain syndrome   2. Long term prescription opiate use   3. S/P lumbar fusion   4. Chronic right-sided low back pain without sciatica   5. Lumbar degenerative disc disease   6. Chronic use of opiate for therapeutic purpose   7. Long term current use of opiate analgesic   8. Lumbar spondylosis      Updated Problems: Problem  Long Term Prescription Opiate Use   General Recommendations: The pain condition that the patient suffers from is best treated with a multidisciplinary approach that involves an increase in physical activity to prevent de-conditioning and worsening of the pain cycle, as well as psychological counseling (formal and/or informal) to address the co-morbid psychological affects of pain. Treatment will often involve judicious use of pain medications and interventional procedures to decrease the pain, allowing the patient to participate in the physical activity that will ultimately produce long-lasting pain reductions. The goal of the multidisciplinary approach is to return the patient to a higher level of overall function and to restore their ability to perform activities of daily living.  Plan of Care  Pharmacotherapy (Medications Ordered): Meds ordered this encounter  Medications  . oxyCODONE (OXYCONTIN) 40 mg 12 hr tablet    Sig: Take 1 tablet (40 mg total) by mouth every 12 (twelve) hours.    Dispense:  60 tablet    Refill:  0    Do not place this medication, or any other prescription from our practice, on "Automatic Refill". Patient may have prescription filled one day early if pharmacy is closed on scheduled refill date.  Marland Kitchen oxyCODONE (OXYCONTIN) 40 mg 12 hr tablet    Sig: Take 1 tablet (40 mg total) by mouth  every 12 (twelve) hours.    Dispense:  60 tablet    Refill:  0    Do not place this medication, or any other prescription from our practice, on "Automatic Refill". Patient may have prescription filled one day early if pharmacy is closed on scheduled refill date.  Marland Kitchen oxyCODONE (OXYCONTIN) 40 mg 12 hr tablet    Sig: Take 1 tablet (40 mg total) by mouth every 12 (twelve) hours.    Dispense:  60 tablet    Refill:  0    Do not place this medication, or any other prescription from our practice, on "Automatic Refill". Patient may have prescription filled one day early if pharmacy is closed on scheduled refill date.  Marland Kitchen oxyCODONE-acetaminophen (PERCOCET) 10-325 MG tablet    Sig: Take 1 tablet by mouth 2 (two) times daily as needed for pain.    Dispense:  60 tablet    Refill:  0  . oxyCODONE-acetaminophen (PERCOCET) 10-325 MG tablet    Sig: Take 1 tablet by mouth 2 (two) times daily as needed for pain.    Dispense:  60 tablet    Refill:  0  . oxyCODONE-acetaminophen (PERCOCET) 10-325 MG tablet    Sig: Take 1 tablet by mouth 2 (two) times daily as needed for pain.    Dispense:  60 tablet    Refill:  0  . tiZANidine (ZANAFLEX) 4 MG tablet    Sig: Take 1 tablet (4 mg total) by mouth 2 (two) times daily as needed for muscle spasms.    Dispense:  60 tablet    Refill:  2  Orders:  Orders Placed This Encounter  Procedures  . UDS (Compliance-13) ([facility]) ([facility]) (Established Pt.)    Volume: 30 ml(s). Minimum 3 ml of urine is needed. Document temperature of fresh sample. Indications: Long term (current) use of opiate analgesic (Z79.891)    Lab Orders     UDS (Compliance-13) ([facility]) ([facility]) (Established Pt.) Planned follow-up:   Return in about 3 months (around 05/17/2019) for Medication Management, virtual.    Recent Visits Date Type Provider Dept  12/15/18 Office Visit Gillis Santa, MD [facility]  Showing recent visits within past 90 days and meeting all other  requirements   Today's Visits Date Type Provider Dept  02/14/19 Office Visit Gillis Santa, MD [facility]  Showing today's visits and meeting all other requirements   Future Appointments No visits were found meeting these conditions.  Showing future appointments within next 90 days and meeting all other requirements   Primary Care Physician: Steele Sizer, MD Location: [facility] Note by: Gillis Santa, MD Date: 02/14/2019; Time: 10:48 AM  Note: This dictation was prepared with Dragon dictation. Any transcriptional errors that may result from this process are unintentional.

## 2019-02-14 NOTE — Progress Notes (Signed)
Nursing Pain Medication Assessment:  Safety precautions to be maintained throughout the outpatient stay will include: orient to surroundings, keep bed in low position, maintain call bell within reach at all times, provide assistance with transfer out of bed and ambulation.  Medication Inspection Compliance: Pill count conducted under aseptic conditions, in front of the patient. Neither the pills nor the bottle was removed from the patient's sight at any time. Once count was completed pills were immediately returned to the patient in their original bottle.  Medication #1: Oxycodone/APAP Pill/Patch Count: 15 of 60 pills remain Pill/Patch Appearance: Markings consistent with prescribed medication Bottle Appearance: Standard pharmacy container. Clearly labeled. Filled Date: 19 / 17 / 2020 Last Medication intake:  Today  Medication #2: Oxycodone ER (OxyContin) Pill/Patch Count: 15 of 60 pills remain Pill/Patch Appearance: Markings consistent with prescribed medication Bottle Appearance: Standard pharmacy container. Clearly labeled. Filled Date: 14 / 17 / 2020 Last Medication intake:  Today

## 2019-02-14 NOTE — Progress Notes (Signed)
Safety precautions to be maintained throughout the outpatient stay will include: orient to surroundings, keep bed in low position, maintain call bell within reach at all times, provide assistance with transfer out of bed and ambulation.  

## 2019-02-16 ENCOUNTER — Encounter: Payer: BLUE CROSS/BLUE SHIELD | Admitting: Student in an Organized Health Care Education/Training Program

## 2019-02-17 LAB — TOXASSURE SELECT 13 (MW), URINE

## 2019-04-03 ENCOUNTER — Other Ambulatory Visit: Payer: Self-pay | Admitting: Family Medicine

## 2019-04-03 DIAGNOSIS — E1169 Type 2 diabetes mellitus with other specified complication: Secondary | ICD-10-CM

## 2019-04-03 DIAGNOSIS — E785 Hyperlipidemia, unspecified: Secondary | ICD-10-CM

## 2019-04-21 ENCOUNTER — Other Ambulatory Visit: Payer: Self-pay | Admitting: Student in an Organized Health Care Education/Training Program

## 2019-04-21 ENCOUNTER — Telehealth: Payer: Self-pay | Admitting: *Deleted

## 2019-04-21 DIAGNOSIS — G894 Chronic pain syndrome: Secondary | ICD-10-CM

## 2019-04-21 MED ORDER — OXYCODONE HCL ER 40 MG PO T12A: 40.0000 mg | EXTENDED_RELEASE_TABLET | Freq: Two times a day (BID) | ORAL | 0 refills | Status: DC

## 2019-04-21 NOTE — Telephone Encounter (Signed)
PA sent for Oxycontin and Percocet.  NEw insurance ID [id number] [id number] RX bin [id number].

## 2019-04-21 NOTE — Telephone Encounter (Signed)
Spoke with pharmacist, Ephriam Knuckles and he states that he accidentally deleted the prescription for Oxycontin 40 mg.  States he hit a wrong button and it deleted the script.  Dr Cherylann Ratel notified and will send in a new script.

## 2019-04-21 NOTE — Progress Notes (Signed)
See nursing note, pharmacy has deleted patient's Oxycontin Rx to be filled today. PMP checked.   E-Prescribing Status: Receipt confirmed by pharmacy (04/21/2019 10:12 AM EST) Requested Prescriptions   Signed Prescriptions Disp Refills  . oxyCODONE (OXYCONTIN) 40 mg 12 hr tablet 60 tablet 0    Sig: Take 1 tablet (40 mg total) by mouth every 12 (twelve) hours.

## 2019-05-15 ENCOUNTER — Encounter: Payer: Self-pay | Admitting: Student in an Organized Health Care Education/Training Program

## 2019-05-16 ENCOUNTER — Encounter: Payer: Self-pay | Admitting: Student in an Organized Health Care Education/Training Program

## 2019-05-16 ENCOUNTER — Other Ambulatory Visit: Payer: Self-pay

## 2019-05-16 ENCOUNTER — Ambulatory Visit
Payer: 59 | Attending: Student in an Organized Health Care Education/Training Program | Admitting: Student in an Organized Health Care Education/Training Program

## 2019-05-16 DIAGNOSIS — M5136 Other intervertebral disc degeneration, lumbar region: Secondary | ICD-10-CM

## 2019-05-16 DIAGNOSIS — M545 Low back pain, unspecified: Secondary | ICD-10-CM

## 2019-05-16 DIAGNOSIS — Z981 Arthrodesis status: Secondary | ICD-10-CM

## 2019-05-16 DIAGNOSIS — M51369 Other intervertebral disc degeneration, lumbar region without mention of lumbar back pain or lower extremity pain: Secondary | ICD-10-CM

## 2019-05-16 DIAGNOSIS — Z79891 Long term (current) use of opiate analgesic: Secondary | ICD-10-CM

## 2019-05-16 DIAGNOSIS — G894 Chronic pain syndrome: Secondary | ICD-10-CM

## 2019-05-16 DIAGNOSIS — M47816 Spondylosis without myelopathy or radiculopathy, lumbar region: Secondary | ICD-10-CM

## 2019-05-16 DIAGNOSIS — G8929 Other chronic pain: Secondary | ICD-10-CM

## 2019-05-16 MED ORDER — OXYCODONE-ACETAMINOPHEN 10-325 MG PO TABS: 1.0000 | ORAL_TABLET | Freq: Two times a day (BID) | ORAL | 0 refills | Status: DC | PRN

## 2019-05-16 MED ORDER — OXYCODONE HCL ER 40 MG PO T12A: 40.0000 mg | EXTENDED_RELEASE_TABLET | Freq: Two times a day (BID) | ORAL | 0 refills | Status: DC

## 2019-05-16 MED ORDER — TIZANIDINE HCL 4 MG PO TABS: 4.0000 mg | ORAL_TABLET | Freq: Two times a day (BID) | ORAL | 2 refills | Status: DC | PRN

## 2019-05-16 NOTE — Progress Notes (Signed)
Patient: Lawrence Thornton.  Service Category: E/M  Provider: Gillis Santa, MD  DOB: Jun 15, 1955  DOS: 05/16/2019  Location: Office  MRN: [id number]  Setting: Ambulatory outpatient  Referring Provider: Steele Sizer, MD  Type: Established Patient  Specialty: Interventional Pain Management  PCP: Lawrence Sizer, MD  Location: Home  Delivery: TeleHealth     Virtual Encounter - Pain Management PROVIDER NOTE: Information contained herein reflects review and annotations entered in association with encounter. Interpretation of such information and data should be left to medically-trained personnel. Information provided to patient can be located elsewhere in the medical record under "Patient Instructions". Document created using STT-dictation technology, any transcriptional errors that may result from process are unintentional.    Contact & Pharmacy Preferred: [phone] Home: [phone] (home) Mobile: [phone] (mobile) E-mail: [email or web address]  [facility] [place], [place] [address] [place] AT [facility] [place] [address] Phone: [phone] Fax: [place], [place] [address] [place] [address] Phone: [phone] Fax: [phone]   Pre-screening  Lawrence Thornton offered "in-person" vs "virtual" encounter. He indicated preferring virtual for this encounter.   Reason COVID-19*  Social distancing based on CDC and AMA recommendations.   I contacted Lawrence Thornton. on 05/16/2019 via telephone.      I clearly identified myself as Lawrence Santa, MD. I verified that I was speaking with the correct person using two identifiers (Name: Lawrence Thornton., and date of birth: Oct 08, 1955).  Consent I sought verbal advanced consent from Lawrence Thornton. for virtual visit interactions. I informed Lawrence Thornton of possible security and privacy concerns, risks,  and limitations associated with providing "not-in-person" medical evaluation and management services. I also informed Lawrence Thornton of the availability of "in-person" appointments. Finally, I informed him that there would be a charge for the virtual visit and that he could be  personally, fully or partially, financially responsible for it. Lawrence Thornton expressed understanding and agreed to proceed.   Historic Elements   Mr. Lawrence Stahnke. is a 64 y.o. year old, male patient evaluated today after his last contact with our practice on 04/21/2019. Lawrence Thornton  has a past medical history of Arthritis and Hypertension. 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 Lawrence Thornton has a current medication list which includes the following prescription(s): amlodipine-valsartan, amoxicillin, aspirin ec, atorvastatin, azithromycin, chlorhexidine, linaclotide, metformin, omega-3 acid ethyl esters, [START ON 05/21/2019] oxycodone, [START ON 06/20/2019] oxycodone, [START ON 07/20/2019] oxycodone, [START ON 05/22/2019] oxycodone-acetaminophen, [START ON 06/21/2019] oxycodone-acetaminophen, [START ON 07/21/2019] oxycodone-acetaminophen, sildenafil, tamsulosin, and tizanidine. He  reports that he quit smoking about 20 years ago. His smoking use included cigarettes. He has a 30.00 pack-year smoking history. He has never used smokeless tobacco. He reports that he does not drink alcohol or use drugs. Lawrence Thornton has No Known Allergies.   HPI  Today, he is being contacted for medication management.   No change in  medical history since last visit.  Patient's pain is at baseline.  Patient continues multimodal pain regimen as prescribed.  States that it  provides pain relief and improvement in functional status.  Pharmacotherapy Assessment  Analgesic: 04/22/2019  1   02/14/2019  Oxycodone-Acetaminophen 10-325  60.00  30 Bi Lat   [id number]   Wal (0252)   0  30.00 MME  Comm Ins   [place]  04/21/2019  1   04/21/2019  Oxycontin Er 40 MG Tablet  60.00  30 Bi Lat   [id number]   Wal (0252)   0  120.00 MME  Comm Ins   [place]     Monitoring: [place] PMP: PDMP reviewed during this encounter.       Pharmacotherapy: No side-effects or adverse reactions reported. Compliance: No problems identified. Effectiveness: Clinically acceptable. Plan: Refer to "POC".  UDS:  Summary  Date Value Ref Range Status  02/14/2019 Note  Final    Comment:    ==================================================================== [facility] Select 13 (MW) ==================================================================== Test                             Result       Flag       Units Drug Present and Declared for Prescription Verification   Oxycodone                      1746         EXPECTED   ng/mg creat   Oxymorphone                    3272         EXPECTED   ng/mg creat   Noroxycodone                   3196         EXPECTED   ng/mg creat   Noroxymorphone                 851          EXPECTED   ng/mg creat    Sources of oxycodone are scheduled prescription medications.    Oxymorphone, noroxycodone, and noroxymorphone are expected    metabolites of oxycodone. Oxymorphone is also available as a    scheduled prescription medication. ==================================================================== Test                      Result    Flag   Units      Ref Range   Creatinine              112              mg/dL      >=20 ==================================================================== Declared Medications:  The flagging and interpretation on  this report are based on the  following declared medications.  Unexpected results may arise from  inaccuracies in the declared medications.  **Note: The testing scope of this panel includes these medications:  Oxycodone  **Note: The testing scope of this panel does not include the  following reported medications:  Acetaminophen  Amlodipine  Amoxicillin  Aspirin  Atorvastatin  Azithromycin  Chlorhexidine  Linaclotide  Metformin  Omega-3 Fatty Acids  Sildenafil  Tamsulosin  Tizanidine  Valsartan ==================================================================== For clinical consultation, please call [phone]. ====================================================================    Laboratory Chemistry Profile   Renal Lab Results  Component Value Date   BUN 11 01/31/2019   CREATININE 0.80 01/31/2019   LABCREA 45 13/11/6576   BCR NOT APPLICABLE 46/96/2952   GFRAA 111 01/31/2019   GFRNONAA 96  01/31/2019    Hepatic Lab Results  Component Value Date   AST 26 01/31/2019   ALT 36 01/31/2019   ALBUMIN 4.6 11/11/2015   ALKPHOS 76 11/11/2015   HCVAB NEGATIVE 10/06/2016    Electrolytes Lab Results  Component Value Date   NA 139 01/31/2019   K 4.4 01/31/2019   CL 101 01/31/2019   CALCIUM 9.9 01/31/2019    Bone Lab Results  Component Value Date   VD25OH 43 10/06/2016    Coagulation Lab Results  Component Value Date   INR 1.07 12/13/2014   LABPROT 14.1 12/13/2014   APTT 34 12/13/2014   PLT 297 07/28/2018    Cardiovascular Lab Results  Component Value Date   HGB 14.2 07/28/2018   HCT 41.2 07/28/2018    Inflammation (CRP: Acute Phase) (ESR: Chronic Phase) No results found for: CRP, ESRSEDRATE, LATICACIDVEN    Note: Above Lab results reviewed.   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 Diagnoses of Chronic pain syndrome, S/P lumbar fusion, Chronic right-sided low back pain without sciatica, Lumbar degenerative  disc disease, Chronic use of opiate for therapeutic purpose, and Lumbar spondylosis were also pertinent to this visit.  Plan of Care  I am having Lawrence Ruddle. Lawrence Romans. start on oxyCODONE-acetaminophen, oxyCODONE-acetaminophen, oxyCODONE, and oxyCODONE. I am also having him maintain his aspirin EC, linaclotide, omega-3 acid ethyl esters, sildenafil, amoxicillin, azithromycin, chlorhexidine, tamsulosin, metFORMIN, amLODipine-valsartan, atorvastatin, oxyCODONE-acetaminophen, oxyCODONE, and tiZANidine.  Pharmacotherapy (Medications Ordered): Meds ordered this encounter  Medications  . oxyCODONE-acetaminophen (PERCOCET) 10-325 MG tablet    Sig: Take 1 tablet by mouth 2 (two) times daily as needed for pain.    Dispense:  60 tablet    Refill:  0  . oxyCODONE-acetaminophen (PERCOCET) 10-325 MG tablet    Sig: Take 1 tablet by mouth 2 (two) times daily as needed for pain.    Dispense:  60 tablet    Refill:  0  . oxyCODONE-acetaminophen (PERCOCET) 10-325 MG tablet    Sig: Take 1 tablet by mouth 2 (two) times daily as needed for pain.    Dispense:  60 tablet    Refill:  0  . oxyCODONE (OXYCONTIN) 40 mg 12 hr tablet    Sig: Take 1 tablet (40 mg total) by mouth every 12 (twelve) hours.    Dispense:  60 tablet    Refill:  0    Do not place this medication, or any other prescription from our practice, on "Automatic Refill". Patient may have prescription filled one day early if pharmacy is closed on scheduled refill date.  Marland Kitchen oxyCODONE (OXYCONTIN) 40 mg 12 hr tablet    Sig: Take 1 tablet (40 mg total) by mouth every 12 (twelve) hours.    Dispense:  60 tablet    Refill:  0    Do not place this medication, or any other prescription from our practice, on "Automatic Refill". Patient may have prescription filled one day early if pharmacy is closed on scheduled refill date.  Marland Kitchen oxyCODONE (OXYCONTIN) 40 mg 12 hr tablet    Sig: Take 1 tablet (40 mg total) by mouth every 12 (twelve) hours.    Dispense:  60  tablet    Refill:  0    Do not place this medication, or any other prescription from our practice, on "Automatic Refill". Patient may have prescription filled one day early if pharmacy is closed on scheduled refill date.  Marland Kitchen tiZANidine (ZANAFLEX) 4 MG tablet    Sig:  Take 1 tablet (4 mg total) by mouth 2 (two) times daily as needed for muscle spasms.    Dispense:  60 tablet    Refill:  2  Follow-up plan:   Return in about 3 months (around 08/13/2019) for Medication Management, in person.    Recent Visits No visits were found meeting these conditions.  Showing recent visits within past 90 days and meeting all other requirements   Today's Visits Date Type Provider Dept  05/16/19 Office Visit Lawrence Santa, MD [facility]  Showing today's visits and meeting all other requirements   Future Appointments No visits were found meeting these conditions.  Showing future appointments within next 90 days and meeting all other requirements   I discussed the assessment and treatment plan with the patient. The patient was provided an opportunity to ask questions and all were answered. The patient agreed with the plan and demonstrated an understanding of the instructions.  Patient advised to call back or seek an in-person evaluation if the symptoms or condition worsens.  Duration of encounter: 57mnutes.  Note by: BGillis Santa MD Date: 05/16/2019; Time: 2:16 PM

## 2019-05-17 ENCOUNTER — Encounter: Payer: Self-pay | Admitting: Family Medicine

## 2019-05-17 ENCOUNTER — Ambulatory Visit (INDEPENDENT_AMBULATORY_CARE_PROVIDER_SITE_OTHER): Payer: 59 | Admitting: Family Medicine

## 2019-05-17 ENCOUNTER — Other Ambulatory Visit: Payer: Self-pay

## 2019-05-17 VITALS — BP 138/76 | HR 100 | Temp 97.5°F | Resp 18 | Ht 72.0 in | Wt 281.7 lb

## 2019-05-17 DIAGNOSIS — Z0000 Encounter for general adult medical examination without abnormal findings: Secondary | ICD-10-CM

## 2019-05-17 DIAGNOSIS — N401 Enlarged prostate with lower urinary tract symptoms: Secondary | ICD-10-CM

## 2019-05-17 DIAGNOSIS — R351 Nocturia: Secondary | ICD-10-CM

## 2019-05-17 NOTE — Progress Notes (Signed)
Name: Lawrence Thornton.   MRN: [id number]    DOB: 1955-07-25   Date:05/17/2019       Progress Note  Subjective  Chief Complaint  Chief Complaint  Patient presents with  . Annual Exam    HPI  Patient presents for annual CPE   USPSTF grade A and B recommendations:  Diet: he is cutting down on sweets, trying to lose the weight he gained over the holidays Exercise: walking 3 days a week for 30 minutes   Depression: phq 9 is negative Depression screen [facility] 2/9 05/17/2019 01/31/2019 11/02/2018 07/28/2018 03/18/2018  Decreased Interest 0 0 0 0 0  Down, Depressed, Hopeless 0 0 0 0 0  PHQ - 2 Score 0 0 0 0 0  Altered sleeping 0 1 0 0 0  Tired, decreased energy 0 0 0 0 0  Change in appetite 0 0 0 0 0  Feeling bad or failure about yourself  0 0 0 0 0  Trouble concentrating 0 0 0 0 0  Moving slowly or fidgety/restless 0 0 0 0 0  Suicidal thoughts 0 0 0 0 0  PHQ-9 Score 0 1 0 0 0  Difficult doing work/chores Not difficult at all Not difficult at all Not difficult at all Not difficult at all Not difficult at all  Some recent data might be hidden    Hypertension:  BP Readings from Last 3 Encounters:  05/17/19 138/76  02/14/19 (!) 156/94  01/31/19 126/68    Obesity: Wt Readings from Last 3 Encounters:  05/17/19 281 lb 11.2 oz (127.8 kg)  02/14/19 270 lb (122.5 kg)  01/31/19 276 lb 1.6 oz (125.2 kg)   BMI Readings from Last 3 Encounters:  05/17/19 38.21 kg/m  02/14/19 36.62 kg/m  01/31/19 37.45 kg/m     Lipids:  Lab Results  Component Value Date   CHOL 131 07/28/2018   CHOL 149 08/13/2017   CHOL 126 11/19/2016   Lab Results  Component Value Date   HDL 48 07/28/2018   HDL 44 08/13/2017   HDL 46 11/19/2016   Lab Results  Component Value Date   LDLCALC 61 07/28/2018   LDLCALC 81 08/13/2017   LDLCALC 56 11/19/2016   Lab Results  Component Value Date   TRIG 141 07/28/2018   TRIG 143 08/13/2017   TRIG 118 11/19/2016   Lab Results  Component Value Date    CHOLHDL 2.7 07/28/2018   CHOLHDL 3.4 08/13/2017   CHOLHDL 2.7 11/19/2016   No results found for: LDLDIRECT Glucose:  Glucose, Bld  Date Value Ref Range Status  01/31/2019 109 (H) 65 - 99 mg/dL Final    Comment:    .            Fasting reference interval . For someone without known diabetes, a glucose value between 100 and 125 mg/dL is consistent with prediabetes and should be confirmed with a follow-up test. .   07/28/2018 99 65 - 99 mg/dL Final    Comment:    .            Fasting reference interval .   08/13/2017 110 (H) 65 - 99 mg/dL Final    Comment:    .            Fasting reference interval . For someone without known diabetes, a glucose value between 100 and 125 mg/dL is consistent with prediabetes and should be confirmed with a follow-up test. .       Office Visit  from 05/17/2019 in [facility]  AUDIT-C Score  0      Married STD testing and prevention (HIV/chl/gon/syphilis): not interested  Hep C: 10/2016  Skin cancer: Discussed monitoring for atypical lesions Colorectal cancer: repeat in 2024  Prostate cancer: USPTF  Lab Results  Component Value Date   PSA 0.2 01/31/2019   PSA 0.7 10/06/2016    IPSS Questionnaire (AUA-7): Over the past month.   1)  How often have you had a sensation of not emptying your bladder completely after you finish urinating?  0 - Not at all  2)  How often have you had to urinate again less than two hours after you finished urinating? 0 - Not at all  3)  How often have you found you stopped and started again several times when you urinated?  0 - Not at all  4) How difficult have you found it to postpone urination?  0 - Not at all  5) How often have you had a weak urinary stream?  0 - Not at all  6) How often have you had to push or strain to begin urination?  0 - Not at all  7) How many times did you most typically get up to urinate from the time you went to bed until the time you got up in the  morning?  2 - 2 times  Total score:  0-7 mildly symptomatic   8-19 moderately symptomatic   20-35 severely symptomatic    Lung cancer:   Low Dose CT Chest recommended if Age 36-80 years, 30 pack-year currently smoking OR have quit w/in 15years. Patient does not qualify.   AAA:  The USPSTF recommends one-time screening with ultrasonography in men ages 39 to 60 years who have ever smoked ECG:  2017  Advanced Care Planning: A voluntary discussion about advance care planning including the explanation and discussion of advance directives.  Discussed health care proxy and Living will, and the patient was able to identify a health care proxy as wife   Patient does have a living will at present time.   Patient Active Problem List   Diagnosis Date Noted  . Long term prescription opiate use 12/15/2018  . Morbid obesity (HCC) 07/28/2018  . Lumbar spondylosis 03/15/2018  . Long term current use of opiate analgesic 12/15/2017  . Dyslipidemia associated with type 2 diabetes mellitus (HCC) 08/13/2017  . Osteoarthritis of acromioclavicular joint 07/30/2017  . S/P lumbar fusion 07/13/2017  . Chronic pain syndrome 07/13/2017  . Personal history of colonic polyps   . Constipation due to opioid therapy 07/18/2015  . Hypertriglyceridemia 01/22/2015  . Extreme obesity 01/22/2015  . S/P knee replacement 12/21/2014  . Chronic right-sided low back pain without sciatica 09/21/2014  . BPH associated with nocturia 09/21/2014  . Dyslipidemia 09/21/2014  . Continuous opioid dependence (HCC) 09/21/2014  . Lumbar degenerative disc disease 09/21/2014  . Failure of erection 09/21/2014    Past Surgical History:  Procedure Laterality Date  . APPENDECTOMY  age 12  . arthroscopic shoulder surgery Right 08/31/2017  . BACK SURGERY  lower back surgery x 3    L 3 L 4 and L 5 are fused together  . COLONOSCOPY WITH PROPOFOL N/A 05/04/2017   Procedure: COLONOSCOPY WITH PROPOFOL;  Surgeon: Midge Minium, MD;  Location:  [facility] ENDOSCOPY;  Service: Endoscopy;  Laterality: N/A;  . LUMBAR FUSION  1999   L- to L5  . metal removed from eye  years ago  . sebaceous cyst  removed Right 15 years ago   middle  finger   . TOE FUSION Right    4th toe  . 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 TOTAL KNEE ARTHROPLASTY Right 12/21/2014   Procedure: RIGHT TOTAL KNEE ARTHROPLASTY;  Surgeon: Eugenia Mcalpine, MD;  Location: [facility] ORS;  Service: Orthopedics;  Laterality: Right;  . TOTAL KNEE ARTHROPLASTY Right 12/21/14    Family History  Problem Relation Age of Onset  . Cancer Mother        Throat  . Heart attack Mother   . Hypertension Father   . Hyperlipidemia Father     Social History   Socioeconomic History  . Marital status: Married    Spouse name: Darl Pikes  . Number of children: 0  . Years of education: Not on file  . Highest education level: Not on file  Occupational History  . Occupation: mill write work   Tobacco Use  . Smoking status: Former Smoker    Packs/day: 1.50    Years: 20.00    Pack years: 30.00    Types: Cigarettes    Quit date: 04/07/1999    Years since quitting: 20.1  . Smokeless tobacco: Never Used  . Tobacco comment: not smoking   Substance and Sexual Activity  . Alcohol use: No  . Drug use: No  . Sexual activity: Yes    Birth control/protection: Condom  Other Topics Concern  . Not on file  Social History Narrative  . Not on file   Social Determinants of Health   Financial Resource Strain: Low Risk   . Difficulty of Paying Living Expenses: Not hard at all  Food Insecurity: No Food Insecurity  . Worried About Programme researcher, broadcasting/film/video in the Last Year: Never true  . Ran Out of Food in the Last Year: Never true  Transportation Needs: No Transportation Needs  . Lack of Transportation (Medical): No  . Lack of Transportation (Non-Medical): No  Physical  Activity: Insufficiently Active  . Days of Exercise per Week: 3 days  . Minutes of Exercise per Session: 30 min  Stress: No Stress Concern Present  . Feeling of Stress : Only a little  Social Connections: Somewhat Isolated  . Frequency of Communication with Friends and Family: Three times a week  . Frequency of Social Gatherings with Friends and Family: Once a week  . Attends Religious Services: Never  . Active Member of Clubs or Organizations: No  . Attends Banker Meetings: Never  . Marital Status: Married  Catering manager Violence: Not At Risk  . Fear of Current or Ex-Partner: No  . Emotionally Abused: No  . Physically Abused: No  . Sexually Abused: No     Current Outpatient Medications:  .  amLODipine-valsartan (EXFORGE) 5-320 MG tablet, Take 1 tablet by mouth daily., Disp: 90 tablet, Rfl: 1 .  aspirin EC 81 MG tablet, Take 1 tablet (81 mg total) by mouth daily., Disp: 30 tablet, Rfl: 0 .  atorvastatin (LIPITOR) 20 MG tablet, TAKE 1 TABLET(20 MG) BY MOUTH DAILY, Disp: 90 tablet, Rfl: 1 .  linaclotide (LINZESS) 145 MCG CAPS capsule, Take 1 capsule (145 mcg total) by mouth daily before breakfast., Disp: 90 capsule, Rfl: 1 .  metFORMIN (GLUCOPHAGE-XR) 750 MG 24 hr tablet, Take 2 tablets (1,500 mg total) by mouth daily with breakfast., Disp: 180 tablet, Rfl: 1 .  omega-3  acid ethyl esters (LOVAZA) 1 g capsule, Take 2 capsules (2 g total) by mouth 2 (two) times daily., Disp: 360 capsule, Rfl: 1 .  [START ON 05/21/2019] oxyCODONE (OXYCONTIN) 40 mg 12 hr tablet, Take 1 tablet (40 mg total) by mouth every 12 (twelve) hours., Disp: 60 tablet, Rfl: 0 .  [START ON 06/20/2019] oxyCODONE (OXYCONTIN) 40 mg 12 hr tablet, Take 1 tablet (40 mg total) by mouth every 12 (twelve) hours., Disp: 60 tablet, Rfl: 0 .  [START ON 07/20/2019] oxyCODONE (OXYCONTIN) 40 mg 12 hr tablet, Take 1 tablet (40 mg total) by mouth every 12 (twelve) hours., Disp: 60 tablet, Rfl: 0 .  [START ON 05/22/2019]  oxyCODONE-acetaminophen (PERCOCET) 10-325 MG tablet, Take 1 tablet by mouth 2 (two) times daily as needed for pain., Disp: 60 tablet, Rfl: 0 .  [START ON 06/21/2019] oxyCODONE-acetaminophen (PERCOCET) 10-325 MG tablet, Take 1 tablet by mouth 2 (two) times daily as needed for pain., Disp: 60 tablet, Rfl: 0 .  [START ON 07/21/2019] oxyCODONE-acetaminophen (PERCOCET) 10-325 MG tablet, Take 1 tablet by mouth 2 (two) times daily as needed for pain., Disp: 60 tablet, Rfl: 0 .  sildenafil (VIAGRA) 100 MG tablet, Take 1 tablet (100 mg total) by mouth daily as needed for erectile dysfunction., Disp: 30 tablet, Rfl: 0 .  tamsulosin (FLOMAX) 0.4 MG CAPS capsule, Take 1 capsule (0.4 mg total) by mouth daily., Disp: 90 capsule, Rfl: 1 .  tiZANidine (ZANAFLEX) 4 MG tablet, Take 1 tablet (4 mg total) by mouth 2 (two) times daily as needed for muscle spasms., Disp: 60 tablet, Rfl: 2 .  amoxicillin (AMOXIL) 500 MG capsule, TK 4 CS PO 1 HOUR BEFORE APPOINTMENT, Disp: , Rfl:  .  azithromycin (ZITHROMAX) 250 MG tablet, TK 2 TS PO ON DAY 1 THEN TK 1 T QD FOR 4 DAYS, Disp: , Rfl:  .  chlorhexidine (PERIDEX) 0.12 % solution, 15 mLs 2 (two) times daily., Disp: , Rfl:   No Known Allergies   ROS  Constitutional: Negative for fever or weight change.  Respiratory: Negative for cough and shortness of breath.   Cardiovascular: Negative for chest pain or palpitations.  Gastrointestinal: Negative for abdominal pain, no bowel changes.  Musculoskeletal: Negative for gait problem or joint swelling.  Skin: Negative for rash.  Neurological: Negative for dizziness or headache.  No other specific complaints in a complete review of systems (except as listed in HPI above).  Objective  Vitals:   05/17/19 0825  BP: 138/76  Pulse: 100  Resp: 18  Temp: (!) 97.5 F (36.4 C)  TempSrc: Temporal  SpO2: 96%  Weight: 281 lb 11.2 oz (127.8 kg)  Height: 6' (1.829 m)    Body mass index is 38.21 kg/m.  Physical  Exam  Constitutional: Patient appears well-developed and well-nourished. No distress.  HENT: Head: Normocephalic and atraumatic. Ears: B TMs ok, no erythema or effusion; Nose: Nose normal. Mouth/Throat: Oropharynx is clear and moist. No oropharyngeal exudate.  Eyes: Conjunctivae and EOM are normal. Pupils are equal, round, and reactive to light. No scleral icterus.  Neck: Normal range of motion. Neck supple. No JVD present. No thyromegaly present.  Cardiovascular: Normal rate, regular rhythm and normal heart sounds.  No murmur heard. No BLE edema. Pulmonary/Chest: Effort normal and breath sounds normal. No respiratory distress. Abdominal: Soft. Bowel sounds are normal, no distension. There is no tenderness. no masses MALE GENITALIA: Normal descended testes bilaterally, no masses palpated, no hernias, no lesions, no discharge RECTAL: Prostate normal size and consistency,  no rectal masses or hemorrhoids Musculoskeletal: Normal range of motion, no joint effusions. No gross deformities Neurological: he is alert and oriented to person, place, and time. No cranial nerve deficit. Coordination, balance, strength, speech and gait are normal.  Skin: Skin is warm and dry. No rash noted. No erythema.  Psychiatric: Patient has a normal mood and affect. behavior is normal. Judgment and thought content normal.   PHQ2/9: Depression screen [facility] 2/9 05/17/2019 01/31/2019 11/02/2018 07/28/2018 03/18/2018  Decreased Interest 0 0 0 0 0  Down, Depressed, Hopeless 0 0 0 0 0  PHQ - 2 Score 0 0 0 0 0  Altered sleeping 0 1 0 0 0  Tired, decreased energy 0 0 0 0 0  Change in appetite 0 0 0 0 0  Feeling bad or failure about yourself  0 0 0 0 0  Trouble concentrating 0 0 0 0 0  Moving slowly or fidgety/restless 0 0 0 0 0  Suicidal thoughts 0 0 0 0 0  PHQ-9 Score 0 1 0 0 0  Difficult doing work/chores Not difficult at all Not difficult at all Not difficult at all Not difficult at all Not difficult at all  Some recent  data might be hidden     Fall Risk: Fall Risk  05/17/2019 01/31/2019 11/02/2018 07/28/2018 06/13/2018  Falls in the past year? 0 1 0 1 0  Number falls in past yr: 0 0 0 0 -  Injury with Fall? 0 0 0 0 -  Follow up Falls evaluation completed - - Falls evaluation completed -      Functional Status Survey: Is the patient deaf or have difficulty hearing?: No Does the patient have difficulty seeing, even when wearing glasses/contacts?: No Does the patient have difficulty concentrating, remembering, or making decisions?: No Does the patient have difficulty walking or climbing stairs?: No Does the patient have difficulty dressing or bathing?: No Does the patient have difficulty doing errands alone such as visiting a doctor's office or shopping?: No    Assessment & Plan  1. Well adult exam   2. BPH associated with nocturia  Continue medication   -Prostate cancer screening and PSA options (with potential risks and benefits of testing vs not testing) were discussed along with recent recs/guidelines. -USPSTF grade A and B recommendations reviewed with patient; age-appropriate recommendations, preventive care, screening tests, etc discussed and encouraged; healthy living encouraged; see AVS for patient education given to patient -Discussed importance of 150 minutes of physical activity weekly, eat two servings of fish weekly, eat one serving of tree nuts ( cashews, pistachios, pecans, almonds.Marland Kitchen) every other day, eat 6 servings of fruit/vegetables daily and drink plenty of water and avoid sweet beverages.

## 2019-05-17 NOTE — Patient Instructions (Signed)
Preventive Care 41-64 Years Old, Male Preventive care refers to lifestyle choices and visits with your health care provider that can promote health and wellness. This includes:  A yearly physical exam. This is also called an annual well check.  Regular dental and eye exams.  Immunizations.  Screening for certain conditions.  Healthy lifestyle choices, such as eating a healthy diet, getting regular exercise, not using drugs or products that contain nicotine and tobacco, and limiting alcohol use. What can I expect for my preventive care visit? Physical exam Your health care provider will check:  Height and weight. These may be used to calculate body mass index (BMI), which is a measurement that tells if you are at a healthy weight.  Heart rate and blood pressure.  Your skin for abnormal spots. Counseling Your health care provider may ask you questions about:  Alcohol, tobacco, and drug use.  Emotional well-being.  Home and relationship well-being.  Sexual activity.  Eating habits.  Work and work Statistician. What immunizations do I need?  Influenza (flu) vaccine  This is recommended every year. Tetanus, diphtheria, and pertussis (Tdap) vaccine  You may need a Td booster every 10 years. Varicella (chickenpox) vaccine  You may need this vaccine if you have not already been vaccinated. Zoster (shingles) vaccine  You may need this after age 64. Measles, mumps, and rubella (MMR) vaccine  You may need at least one dose of MMR if you were born in 1957 or later. You may also need a second dose. Pneumococcal conjugate (PCV13) vaccine  You may need this if you have certain conditions and were not previously vaccinated. Pneumococcal polysaccharide (PPSV23) vaccine  You may need one or two doses if you smoke cigarettes or if you have certain conditions. Meningococcal conjugate (MenACWY) vaccine  You may need this if you have certain conditions. Hepatitis A  vaccine  You may need this if you have certain conditions or if you travel or work in places where you may be exposed to hepatitis A. Hepatitis B vaccine  You may need this if you have certain conditions or if you travel or work in places where you may be exposed to hepatitis B. Haemophilus influenzae type b (Hib) vaccine  You may need this if you have certain risk factors. Human papillomavirus (HPV) vaccine  If recommended by your health care provider, you may need three doses over 6 months. You may receive vaccines as individual doses or as more than one vaccine together in one shot (combination vaccines). Talk with your health care provider about the risks and benefits of combination vaccines. What tests do I need? Blood tests  Lipid and cholesterol levels. These may be checked every 5 years, or more frequently if you are over 60 years old.  Hepatitis C test.  Hepatitis B test. Screening  Lung cancer screening. You may have this screening every year starting at age 43 if you have a 30-pack-year history of smoking and currently smoke or have quit within the past 15 years.  Prostate cancer screening. Recommendations will vary depending on your family history and other risks.  Colorectal cancer screening. All adults should have this screening starting at age 72 and continuing until age 2. Your health care provider may recommend screening at age 14 if you are at increased risk. You will have tests every 1-10 years, depending on your results and the type of screening test.  Diabetes screening. This is done by checking your blood sugar (glucose) after you have not eaten  for a while (fasting). You may have this done every 1-3 years.  Sexually transmitted disease (STD) testing. Follow these instructions at home: Eating and drinking  Eat a diet that includes fresh fruits and vegetables, whole grains, lean protein, and low-fat dairy products.  Take vitamin and mineral supplements as  recommended by your health care provider.  Do not drink alcohol if your health care provider tells you not to drink.  If you drink alcohol: ? Limit how much you have to 0-2 drinks a day. ? Be aware of how much alcohol is in your drink. In the [place] one drink equals one 12 oz bottle of beer (355 mL), one 5 oz glass of wine (148 mL), or one 1 oz glass of hard liquor (44 mL). Lifestyle  Take daily care of your teeth and gums.  Stay active. Exercise for at least 30 minutes on 5 or more days each week.  Do not use any products that contain nicotine or tobacco, such as cigarettes, e-cigarettes, and chewing tobacco. If you need help quitting, ask your health care provider.  If you are sexually active, practice safe sex. Use a condom or other form of protection to prevent STIs (sexually transmitted infections).  Talk with your health care provider about taking a low-dose aspirin every day starting at age 53. What's next?  Go to your health care provider once a year for a well check visit.  Ask your health care provider how often you should have your eyes and teeth checked.  Stay up to date on all vaccines. This information is not intended to replace advice given to you by your health care provider. Make sure you discuss any questions you have with your health care provider. Document Revised: 03/17/2018 Document Reviewed: 03/17/2018 Elsevier Patient Education  2020 [employer].

## 2019-08-01 ENCOUNTER — Other Ambulatory Visit: Payer: Self-pay

## 2019-08-01 ENCOUNTER — Ambulatory Visit (INDEPENDENT_AMBULATORY_CARE_PROVIDER_SITE_OTHER): Payer: 59 | Admitting: Family Medicine

## 2019-08-01 ENCOUNTER — Encounter: Payer: Self-pay | Admitting: Family Medicine

## 2019-08-01 VITALS — BP 160/70 | HR 89 | Temp 96.6°F | Resp 16 | Ht 72.0 in | Wt 282.6 lb

## 2019-08-01 DIAGNOSIS — I10 Essential (primary) hypertension: Secondary | ICD-10-CM

## 2019-08-01 DIAGNOSIS — F1120 Opioid dependence, uncomplicated: Secondary | ICD-10-CM

## 2019-08-01 DIAGNOSIS — N521 Erectile dysfunction due to diseases classified elsewhere: Secondary | ICD-10-CM

## 2019-08-01 DIAGNOSIS — K5903 Drug induced constipation: Secondary | ICD-10-CM

## 2019-08-01 DIAGNOSIS — G894 Chronic pain syndrome: Secondary | ICD-10-CM

## 2019-08-01 DIAGNOSIS — N401 Enlarged prostate with lower urinary tract symptoms: Secondary | ICD-10-CM

## 2019-08-01 DIAGNOSIS — E1169 Type 2 diabetes mellitus with other specified complication: Secondary | ICD-10-CM

## 2019-08-01 DIAGNOSIS — E785 Hyperlipidemia, unspecified: Secondary | ICD-10-CM

## 2019-08-01 DIAGNOSIS — T402X5A Adverse effect of other opioids, initial encounter: Secondary | ICD-10-CM

## 2019-08-01 DIAGNOSIS — R351 Nocturia: Secondary | ICD-10-CM

## 2019-08-01 LAB — POCT GLYCOSYLATED HEMOGLOBIN (HGB A1C): Hemoglobin A1C: 6.1 % — AB (ref 4.0–5.6)

## 2019-08-01 MED ORDER — TAMSULOSIN HCL 0.4 MG PO CAPS: 0.4000 mg | ORAL_CAPSULE | Freq: Every day | ORAL | 1 refills | Status: DC

## 2019-08-01 MED ORDER — AMLODIPINE BESYLATE-VALSARTAN 10-320 MG PO TABS: 1.0000 | ORAL_TABLET | Freq: Every day | ORAL | 1 refills | Status: DC

## 2019-08-01 MED ORDER — AMLODIPINE BESYLATE 5 MG PO TABS: 5.0000 mg | ORAL_TABLET | Freq: Every day | ORAL | 0 refills | Status: DC

## 2019-08-01 MED ORDER — ATORVASTATIN CALCIUM 20 MG PO TABS: 20.0000 mg | ORAL_TABLET | Freq: Every day | ORAL | 1 refills | Status: DC

## 2019-08-01 MED ORDER — METFORMIN HCL ER 750 MG PO TB24: 1500.0000 mg | ORAL_TABLET | Freq: Every day | ORAL | 1 refills | Status: DC

## 2019-08-01 MED ORDER — OMEGA-3-ACID ETHYL ESTERS 1 G PO CAPS: 2.0000 g | ORAL_CAPSULE | Freq: Every day | ORAL | 0 refills | Status: DC

## 2019-08-01 NOTE — Progress Notes (Signed)
Name: Lawrence Thornton.   MRN: [id number]    DOB: December 14, 1955   Date:08/01/2019       Progress Note  Subjective  Chief Complaint  Chief Complaint  Patient presents with  . Diabetes  . Dyslipidemia  . Obesity  . Hypertension    HPI   DMII:diagnosed May 2019 with a hgb6.9% He denies polydipsia ( but states likes water), no polyphagia, has nocturia from BPH.He is due for eye exam and also urine micro today . Denies blurred vision. He has high triglycerides andhe wasgemfibrozil, but we switched from gemfibrozil to Lovaza 2 at night because that is how he can tolerate and also Atorvastatin daily. He has Viagra and takes it prn only. He is having ice cream.   Chronic pain and opioid use: he was given Linzessand only takes it prn.He states bowel movements are now daily, but states needs to take medication occasionally to help him have a bowel movement. He is under the care of [facility] and Dr. Cherylann Ratel, pain level now is 3/10 right lower back. He is complaint with his visits and takes medication as prescribed   Morbid obesity: BMI above 35 He has multiple co-morbidities: DM, HTN, hyperlipidemia. He has gained 12 lbs since last visit. He is busy at work , he states not walking his dog when he gets home. 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 We will avoid adding hctz to control his bp.   HTN:bp is elevated today , no chest pain, palpitation or SOB. Tolerating new medication well, we will increase norvasc from 5 mg to 10 mg daily    Patient Active Problem List   Diagnosis Date Noted  . Long term prescription opiate use 12/15/2018  . Morbid obesity (HCC) 07/28/2018  . Lumbar spondylosis 03/15/2018  . Long term current use of opiate analgesic 12/15/2017  . Dyslipidemia associated with type 2 diabetes mellitus (HCC) 08/13/2017  . Osteoarthritis of  acromioclavicular joint 07/30/2017  . S/P lumbar fusion 07/13/2017  . Chronic pain syndrome 07/13/2017  . Personal history of colonic polyps   . Constipation due to opioid therapy 07/18/2015  . Hypertriglyceridemia 01/22/2015  . Extreme obesity 01/22/2015  . S/P knee replacement 12/21/2014  . Chronic right-sided low back pain without sciatica 09/21/2014  . BPH associated with nocturia 09/21/2014  . Dyslipidemia 09/21/2014  . Continuous opioid dependence (HCC) 09/21/2014  . Lumbar degenerative disc disease 09/21/2014  . Failure of erection 09/21/2014    Past Surgical History:  Procedure Laterality Date  . APPENDECTOMY  age 89  . arthroscopic shoulder surgery Right 08/31/2017  . BACK SURGERY  lower back surgery x 3    L 3 L 4 and L 5 are fused together  . COLONOSCOPY WITH PROPOFOL N/A 05/04/2017   Procedure: COLONOSCOPY WITH PROPOFOL;  Surgeon: Midge Minium, MD;  Location: [facility] ENDOSCOPY;  Service: Endoscopy;  Laterality: N/A;  . LUMBAR FUSION  1999   L- to L5  . metal removed from eye  years ago  . sebaceous cyst removed Right 15 years ago   middle  finger   . TOE FUSION Right    4th toe  . 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 TOTAL KNEE ARTHROPLASTY Right 12/21/2014   Procedure: RIGHT  TOTAL KNEE ARTHROPLASTY;  Surgeon: Sydnee Cabal, MD;  Location: [facility] ORS;  Service: Orthopedics;  Laterality: Right;  . TOTAL KNEE ARTHROPLASTY Right 12/21/14    Family History  Problem Relation Age of Onset  . Cancer Mother        Throat  . Heart attack Mother   . Hypertension Father   . Hyperlipidemia Father     Social History   Tobacco Use  . Smoking status: Former Smoker    Packs/day: 1.50    Years: 20.00    Pack years: 30.00    Types: Cigarettes    Quit date: 04/07/1999    Years since quitting: 20.3  . Smokeless tobacco: Never Used  . Tobacco  comment: not smoking   Substance Use Topics  . Alcohol use: No     Current Outpatient Medications:  .  amLODipine-valsartan (EXFORGE) 5-320 MG tablet, Take 1 tablet by mouth daily., Disp: 90 tablet, Rfl: 1 .  amoxicillin (AMOXIL) 500 MG capsule, TK 4 CS PO 1 HOUR BEFORE APPOINTMENT, Disp: , Rfl:  .  aspirin EC 81 MG tablet, Take 1 tablet (81 mg total) by mouth daily., Disp: 30 tablet, Rfl: 0 .  atorvastatin (LIPITOR) 20 MG tablet, Take 1 tablet (20 mg total) by mouth daily., Disp: 90 tablet, Rfl: 1 .  chlorhexidine (PERIDEX) 0.12 % solution, 15 mLs 2 (two) times daily., Disp: , Rfl:  .  linaclotide (LINZESS) 145 MCG CAPS capsule, Take 1 capsule (145 mcg total) by mouth daily before breakfast., Disp: 90 capsule, Rfl: 1 .  metFORMIN (GLUCOPHAGE-XR) 750 MG 24 hr tablet, Take 2 tablets (1,500 mg total) by mouth daily with breakfast., Disp: 180 tablet, Rfl: 1 .  omega-3 acid ethyl esters (LOVAZA) 1 g capsule, Take 2 capsules (2 g total) by mouth daily., Disp: 180 capsule, Rfl: 0 .  oxyCODONE (OXYCONTIN) 40 mg 12 hr tablet, Take 1 tablet (40 mg total) by mouth every 12 (twelve) hours., Disp: 60 tablet, Rfl: 0 .  oxyCODONE-acetaminophen (PERCOCET) 10-325 MG tablet, Take 1 tablet by mouth 2 (two) times daily as needed for pain., Disp: 60 tablet, Rfl: 0 .  sildenafil (VIAGRA) 100 MG tablet, Take 1 tablet (100 mg total) by mouth daily as needed for erectile dysfunction., Disp: 30 tablet, Rfl: 0 .  tamsulosin (FLOMAX) 0.4 MG CAPS capsule, Take 1 capsule (0.4 mg total) by mouth daily., Disp: 90 capsule, Rfl: 1 .  tiZANidine (ZANAFLEX) 4 MG tablet, Take 1 tablet (4 mg total) by mouth 2 (two) times daily as needed for muscle spasms., Disp: 60 tablet, Rfl: 2  No Known Allergies  I personally reviewed active problem list, medication list, allergies, family history, social history, health maintenance with the patient/caregiver today.   ROS  Constitutional: Negative for fever , positive for weight change.   Respiratory: Negative for cough and shortness of breath.   Cardiovascular: Negative for chest pain or palpitations.  Gastrointestinal: Negative for abdominal pain, no bowel changes.  Musculoskeletal: Negative for gait problem or joint swelling.  Skin: Negative for rash.  Neurological: Negative for dizziness or headache.  No other specific complaints in a complete review of systems (except as listed in HPI above).  Objective  Vitals:   08/01/19 0829 08/01/19 0838  BP: (!) 160/70 (!) 160/70  Pulse: 89   Resp: 16   Temp: (!) 96.6 F (35.9 C)   TempSrc: Temporal   SpO2: 96%   Weight: 282 lb 9.6 oz (128.2 kg)   Height: 6' (1.829 m)  Body mass index is 38.33 kg/m.  Physical Exam  Constitutional: Patient appears well-developed and well-nourished. Obese  No distress.  HEENT: head atraumatic, normocephalic, pupils equal and reactive to light Cardiovascular: Normal rate, regular rhythm and normal heart sounds.  No murmur heard. No BLE edema. Pulmonary/Chest: Effort normal and breath sounds normal. No respiratory distress. Abdominal: Soft.  There is no tenderness. Muscular Skeletal: tenderness during palpation of lumbar spine, negative straight leg raise, mild decrease extension of lumbar spine  Psychiatric: Patient has a normal mood and affect. behavior is normal. Judgment and thought content normal.  Recent Results (from the past 2160 hour(s))  POCT HgB A1C     Status: Abnormal   Collection Time: 08/01/19  8:36 AM  Result Value Ref Range   Hemoglobin A1C 6.1 (A) 4.0 - 5.6 %   HbA1c POC (<> result, manual entry)     HbA1c, POC (prediabetic range)     HbA1c, POC (controlled diabetic range)        PHQ2/9: Depression screen [facility] 2/9 08/01/2019 05/17/2019 01/31/2019 11/02/2018 07/28/2018  Decreased Interest 0 0 0 0 0  Down, Depressed, Hopeless 0 0 0 0 0  PHQ - 2 Score 0 0 0 0 0  Altered sleeping 0 0 1 0 0  Tired, decreased energy 0 0 0 0 0  Change in appetite 0 0 0 0 0   Feeling bad or failure about yourself  0 0 0 0 0  Trouble concentrating 0 0 0 0 0  Moving slowly or fidgety/restless 0 0 0 0 0  Suicidal thoughts 0 0 0 0 0  PHQ-9 Score 0 0 1 0 0  Difficult doing work/chores - Not difficult at all Not difficult at all Not difficult at all Not difficult at all  Some recent data might be hidden    phq 9 is negative   Fall Risk: Fall Risk  08/01/2019 05/17/2019 01/31/2019 11/02/2018 07/28/2018  Falls in the past year? 0 0 1 0 1  Number falls in past yr: 0 0 0 0 0  Injury with Fall? 0 0 0 0 0  Follow up - Falls evaluation completed - - Falls evaluation completed     Functional Status Survey: Is the patient deaf or have difficulty hearing?: No Does the patient have difficulty seeing, even when wearing glasses/contacts?: No Does the patient have difficulty concentrating, remembering, or making decisions?: No Does the patient have difficulty walking or climbing stairs?: No Does the patient have difficulty dressing or bathing?: No Does the patient have difficulty doing errands alone such as visiting a doctor's office or shopping?: No    Assessment & Plan  1. Dyslipidemia associated with type 2 diabetes mellitus (HCC)  - POCT HgB A1C - atorvastatin (LIPITOR) 20 MG tablet; Take 1 tablet (20 mg total) by mouth daily.  Dispense: 90 tablet; Refill: 1 - metFORMIN (GLUCOPHAGE-XR) 750 MG 24 hr tablet; Take 2 tablets (1,500 mg total) by mouth daily with breakfast.  Dispense: 180 tablet; Refill: 1 - omega-3 acid ethyl esters (LOVAZA) 1 g capsule; Take 2 capsules (2 g total) by mouth daily.  Dispense: 180 capsule; Refill: 0 - Microalbumin / creatinine urine ratio  2. Chronic pain syndrome  Under the care of Dr. Cherylann Ratel   3. Essential hypertension  - CBC with Differential/Platelet - COMPLETE METABOLIC PANEL WITH GFR  4. Dyslipidemia  - Lipid panel  5. Morbid obesity (HCC)  Discussed with the patient the risk posed by an increased BMI. Discussed  importance of  portion control, calorie counting and at least 150 minutes of physical activity weekly. Avoid sweet beverages and drink more water. Eat at least 6 servings of fruit and vegetables daily   6. Constipation due to opioid therapy   7. BPH associated with nocturia  - tamsulosin (FLOMAX) 0.4 MG CAPS capsule; Take 1 capsule (0.4 mg total) by mouth daily.  Dispense: 90 capsule; Refill: 1  8. Continuous opioid dependence (HCC)   9. Erectile dysfunction due to type 2 diabetes mellitus (HCC)  Still has viagra

## 2019-08-02 LAB — COMPLETE METABOLIC PANEL WITH GFR
AG Ratio: 1.7 (calc) (ref 1.0–2.5)
ALT: 42 U/L (ref 9–46)
AST: 33 U/L (ref 10–35)
Albumin: 4.7 g/dL (ref 3.6–5.1)
Alkaline phosphatase (APISO): 84 U/L (ref 35–144)
BUN: 11 mg/dL (ref 7–25)
CO2: 31 mmol/L (ref 20–32)
Calcium: 10.2 mg/dL (ref 8.6–10.3)
Chloride: 101 mmol/L (ref 98–110)
Creat: 0.75 mg/dL (ref 0.70–1.25)
GFR, Est African American: 113 mL/min/{1.73_m2} (ref >=60)
GFR, Est Non African American: 98 mL/min/{1.73_m2} (ref >=60)
Globulin: 2.7 g/dL (calc) (ref 1.9–3.7)
Glucose, Bld: 139 mg/dL — ABNORMAL HIGH (ref 65–99)
Potassium: 4.6 mmol/L (ref 3.5–5.3)
Sodium: 138 mmol/L (ref 135–146)
Total Bilirubin: 0.4 mg/dL (ref 0.2–1.2)
Total Protein: 7.4 g/dL (ref 6.1–8.1)

## 2019-08-02 LAB — CBC WITH DIFFERENTIAL/PLATELET
Absolute Monocytes: 618 cells/uL (ref 200–950)
Basophils Absolute: 52 cells/uL (ref 0–200)
Basophils Relative: 0.8 %
Eosinophils Absolute: 228 cells/uL (ref 15–500)
Eosinophils Relative: 3.5 %
HCT: 43.2 % (ref 38.5–50.0)
Hemoglobin: 14.6 g/dL (ref 13.2–17.1)
Lymphs Abs: 2321 cells/uL (ref 850–3900)
MCH: 29.9 pg (ref 27.0–33.0)
MCHC: 33.8 g/dL (ref 32.0–36.0)
MCV: 88.3 fL (ref 80.0–100.0)
MPV: 9.4 fL (ref 7.5–12.5)
Monocytes Relative: 9.5 %
Neutro Abs: 3283 cells/uL (ref 1500–7800)
Neutrophils Relative %: 50.5 %
Platelets: 289 10*3/uL (ref 140–400)
RBC: 4.89 10*6/uL (ref 4.20–5.80)
RDW: 12.5 % (ref 11.0–15.0)
Total Lymphocyte: 35.7 %
WBC: 6.5 10*3/uL (ref 3.8–10.8)

## 2019-08-02 LAB — LIPID PANEL
Cholesterol: 131 mg/dL (ref <200)
HDL: 47 mg/dL (ref >=40)
LDL Cholesterol (Calc): 63 mg/dL (calc)
Non-HDL Cholesterol (Calc): 84 mg/dL (calc) (ref <130)
Total CHOL/HDL Ratio: 2.8 (calc) (ref <5.0)
Triglycerides: 129 mg/dL (ref <150)

## 2019-08-02 LAB — MICROALBUMIN / CREATININE URINE RATIO
Creatinine, Urine: 96 mg/dL (ref 20–320)
Microalb Creat Ratio: 17 mcg/mg creat (ref <30)
Microalb, Ur: 1.6 mg/dL

## 2019-08-07 ENCOUNTER — Ambulatory Visit
Payer: 59 | Attending: Student in an Organized Health Care Education/Training Program | Admitting: Student in an Organized Health Care Education/Training Program

## 2019-08-07 ENCOUNTER — Encounter: Payer: Self-pay | Admitting: Student in an Organized Health Care Education/Training Program

## 2019-08-07 ENCOUNTER — Other Ambulatory Visit: Payer: Self-pay

## 2019-08-07 VITALS — BP 131/81 | HR 82 | Temp 97.3°F | Resp 16 | Ht 72.0 in | Wt 282.0 lb

## 2019-08-07 DIAGNOSIS — M5136 Other intervertebral disc degeneration, lumbar region: Secondary | ICD-10-CM | POA: Diagnosis present

## 2019-08-07 DIAGNOSIS — G894 Chronic pain syndrome: Secondary | ICD-10-CM | POA: Diagnosis present

## 2019-08-07 DIAGNOSIS — M47816 Spondylosis without myelopathy or radiculopathy, lumbar region: Secondary | ICD-10-CM | POA: Diagnosis present

## 2019-08-07 DIAGNOSIS — Z79891 Long term (current) use of opiate analgesic: Secondary | ICD-10-CM | POA: Insufficient documentation

## 2019-08-07 DIAGNOSIS — M545 Low back pain, unspecified: Secondary | ICD-10-CM

## 2019-08-07 DIAGNOSIS — Z981 Arthrodesis status: Secondary | ICD-10-CM | POA: Diagnosis present

## 2019-08-07 DIAGNOSIS — G8929 Other chronic pain: Secondary | ICD-10-CM | POA: Insufficient documentation

## 2019-08-07 MED ORDER — OXYCODONE HCL ER 40 MG PO T12A: 40.0000 mg | EXTENDED_RELEASE_TABLET | Freq: Two times a day (BID) | ORAL | 0 refills | Status: DC

## 2019-08-07 MED ORDER — OXYCODONE-ACETAMINOPHEN 10-325 MG PO TABS: 1.0000 | ORAL_TABLET | Freq: Two times a day (BID) | ORAL | 0 refills | Status: DC | PRN

## 2019-08-07 MED ORDER — TIZANIDINE HCL 4 MG PO TABS: 4.0000 mg | ORAL_TABLET | Freq: Two times a day (BID) | ORAL | 2 refills | Status: DC | PRN

## 2019-08-07 NOTE — Progress Notes (Signed)
PROVIDER NOTE: Information contained herein reflects review and annotations entered in association with encounter. Interpretation of such information and data should be left to medically-trained personnel. Information provided to patient can be located elsewhere in the medical record under "Patient Instructions". Document created using STT-dictation technology, any transcriptional errors that may result from process are unintentional.    Patient: Lawrence Thornton.  Service Category: E/M  Provider: Gillis Santa, MD  DOB: January 22, 1956  DOS: 08/07/2019  Referring Provider: Steele Sizer, MD  MRN: [id number]  Setting: Ambulatory outpatient  PCP: Steele Sizer, MD  Type: Established Patient  Specialty: Interventional Pain Management    Location: Office  Delivery: Face-to-face     Primary Reason(s) for Visit: Encounter for prescription drug management. (Level of risk: moderate)  CC: Back Pain (lumbar right )  HPI  Lawrence Thornton is a 64 y.o. year old, male patient, who comes today for a medication management evaluation. He has Chronic right-sided low back pain without sciatica; BPH associated with nocturia; Dyslipidemia; Continuous opioid dependence ([place]); Lumbar degenerative disc disease; Failure of erection; S/P knee replacement; Hypertriglyceridemia; Extreme obesity; Constipation due to opioid therapy; Personal history of colonic polyps; S/P lumbar fusion; Chronic pain syndrome; Osteoarthritis of acromioclavicular joint; Dyslipidemia associated with type 2 diabetes mellitus ([place]); Long term current use of opiate analgesic; Lumbar spondylosis; Morbid obesity ([place]); and Long term prescription opiate use on their problem list. His primarily concern today is the Back Pain (lumbar right )  Pain Assessment: Location: Lower, Right Back Radiating: denies Onset: More than a month ago Duration: Chronic pain Quality: Dull, Discomfort, Sharp, Constant Severity: 3 /10 (subjective, self-reported pain score)   Note: Reported level is compatible with observation.                         When using our objective Pain Scale, levels between 6 and 10/10 are said to belong in an emergency room, as it progressively worsens from a 6/10, described as severely limiting, requiring emergency care not usually available at an outpatient pain management facility. At a 6/10 level, communication becomes difficult and requires great effort. Assistance to reach the emergency department may be required. Facial flushing and profuse sweating along with potentially dangerous increases in heart rate and blood pressure will be evident. Effect on ADL: has to be careful what or how much he lifts. Timing:   Modifying factors: medication BP: 131/81  HR: 82  Lawrence Thornton was last scheduled for an appointment on 05/16/2019 for medication management. During today's appointment we reviewed Lawrence Thornton's chronic pain status, as well as his outpatient medication regimen.  No change in medical history since last visit.  Patient's pain is at baseline.  Patient continues multimodal pain regimen as prescribed.  States that it provides pain relief and improvement in functional status.  The patient  reports no history of drug use. His body mass index is 38.25 kg/m.  Further details on both, my assessment(s), as well as the proposed treatment plan, please see below.  Controlled Substance Pharmacotherapy Assessment REMS (Risk Evaluation and Mitigation Strategy)  Analgesic:  07/21/2019  2   05/16/2019  Oxycodone-Acetaminophen 10-325  60.00  30 Bi Lat   [id number]   Wal (0252)   0  30.00 MME  Comm Ins     07/20/2019  2   05/16/2019  Oxycontin Er 40 MG Tablet  60.00  30 Bi Lat   [id number]   Wal (0252)   0  120.00 MME  Comm Ins   [place]     Janett Billow, RN  08/07/2019 12:48 PM  Sign when Signing Visit Nursing Pain Medication Assessment:  Safety precautions to be maintained throughout the outpatient stay will include: orient to surroundings,  keep bed in low position, maintain call bell within reach at all times, provide assistance with transfer out of bed and ambulation.  Medication Inspection Compliance: Pill count conducted under aseptic conditions, in front of the patient. Neither the pills nor the bottle was removed from the patient's sight at any time. Once count was completed pills were immediately returned to the patient in their original bottle.  Medication #1: Oxycodone ER (OxyContin) Pill/Patch Count: 25 of 60 pills remain Pill/Patch Appearance: Markings consistent with prescribed medication Bottle Appearance: Standard pharmacy container. Clearly labeled. Filled Date: 04 / 15 / 2021 Last Medication intake:  Today  Medication #2: Oxycodone/APAP Pill/Patch Count: 27 of 60 pills remain Pill/Patch Appearance: Markings consistent with prescribed medication Bottle Appearance: Standard pharmacy container. Clearly labeled. Filled Date: 04 / 16 / 2021 Last Medication intake:  Today   Pharmacokinetics: Liberation and absorption (onset of action): WNL Distribution (time to peak effect): WNL Metabolism and excretion (duration of action): WNL         Pharmacodynamics: Desired effects: Analgesia: Lawrence Thornton reports >50% benefit. Functional ability: Patient reports that medication allows him to accomplish basic ADLs Clinically meaningful improvement in function (CMIF): Sustained CMIF goals met Perceived effectiveness: Described as relatively effective, allowing for increase in activities of daily living (ADL) Undesirable effects: Side-effects or Adverse reactions: None reported Monitoring: [place] PMP: PDMP not reviewed this encounter. Online review of the past 61-monthperiod conducted. Compliant with practice rules and regulations Last UDS on record: Summary  Date Value Ref Range Status  02/14/2019 Note  Final    Comment:    ==================================================================== [employer] [facility] [employer]  (MW) ==================================================================== Test                             Result       Flag       Units Drug Present and Declared for Prescription Verification   Oxycodone                      1746         EXPECTED   ng/mg creat   Oxymorphone                    3272         EXPECTED   ng/mg creat   Noroxycodone                   3196         EXPECTED   ng/mg creat   Noroxymorphone                 851          EXPECTED   ng/mg creat    Sources of oxycodone are scheduled prescription medications.    Oxymorphone, noroxycodone, and noroxymorphone are expected    metabolites of oxycodone. Oxymorphone is also available as a    scheduled prescription medication. ==================================================================== Test                      Result    Flag   Units      Ref Range   Creatinine  112              mg/dL      >=20 ==================================================================== Declared Medications:  The flagging and interpretation on this report are based on the  following declared medications.  Unexpected results may arise from  inaccuracies in the declared medications.  **Note: The testing scope of this panel includes these medications:  Oxycodone  **Note: The testing scope of this panel does not include the  following reported medications:  Acetaminophen  Amlodipine  Amoxicillin  Aspirin  Atorvastatin  Azithromycin  Chlorhexidine  Linaclotide  Metformin  Omega-3 Fatty Acids  Sildenafil  Tamsulosin  Tizanidine  Valsartan ==================================================================== For clinical consultation, please call [phone]. ====================================================================    UDS interpretation: Compliant          Medication Assessment Form: Reviewed. Patient indicates being compliant with therapy Treatment compliance: Compliant Risk Assessment Profile: Aberrant  behavior: See initial evaluations. None observed or detected today Comorbid factors increasing risk of overdose: See initial evaluation. No additional risks detected today Opioid risk tool (ORT):  Opioid Risk  08/07/2019  Alcohol 0  Illegal Drugs 0  Rx Drugs 0  Alcohol 0  Illegal Drugs 0  Rx Drugs 0  Age between 16-45 years  0  History of Preadolescent Sexual Abuse -  Psychological Disease 0  Depression 0  Opioid Risk Tool Scoring 0  Opioid Risk Interpretation Low Risk    ORT Scoring interpretation table:  Score <3 = Low Risk for SUD  Score between 4-7 = Moderate Risk for SUD  Score >8 = High Risk for Opioid Abuse   Risk of substance use disorder (SUD): Low  Risk Mitigation Strategies:  Patient Counseling: Covered Patient-Prescriber Agreement (PPA): Present and active  Notification to other healthcare providers: Done  Pharmacologic Plan: No change in therapy, at this time.             Laboratory Chemistry Profile   Renal Lab Results  Component Value Date   BUN 11 08/01/2019   CREATININE 0.75 08/01/2019   LABCREA 96 17/49/4496   BCR NOT APPLICABLE 75/91/6384   GFRAA 113 08/01/2019   GFRNONAA 98 08/01/2019   PROTEINUR NEGATIVE 12/26/2014     Electrolytes Lab Results  Component Value Date   NA 138 08/01/2019   K 4.6 08/01/2019   CL 101 08/01/2019   CALCIUM 10.2 08/01/2019     Hepatic Lab Results  Component Value Date   AST 33 08/01/2019   ALT 42 08/01/2019   ALBUMIN 4.6 11/11/2015   ALKPHOS 76 11/11/2015     ID Lab Results  Component Value Date   STAPHAUREUS NEGATIVE 12/13/2014   MRSAPCR NEGATIVE 12/13/2014   HCVAB NEGATIVE 10/06/2016     Bone Lab Results  Component Value Date   VD25OH 43 10/06/2016     Endocrine Lab Results  Component Value Date   GLUCOSE 139 (H) 08/01/2019   GLUCOSEU NEGATIVE 12/26/2014   HGBA1C 6.1 (A) 08/01/2019   TSH 0.65 10/06/2016     Neuropathy Lab Results  Component Value Date   HGBA1C 6.1 (A) 08/01/2019      CNS No results found for: COLORCSF, APPEARCSF, RBCCOUNTCSF, WBCCSF, POLYSCSF, LYMPHSCSF, EOSCSF, PROTEINCSF, GLUCCSF, JCVIRUS, CSFOLI, IGGCSF, LABACHR, ACETBL, LABACHR, ACETBL   Inflammation (CRP: Acute  ESR: Chronic) No results found for: CRP, ESRSEDRATE, LATICACIDVEN   Rheumatology No results found for: RF, ANA, LABURIC, URICUR, LYMEIGGIGMAB, LYMEABIGMQN, HLAB27   Coagulation Lab Results  Component Value Date   INR 1.07 12/13/2014   LABPROT 14.1  12/13/2014   APTT 34 12/13/2014   PLT 289 08/01/2019     Cardiovascular Lab Results  Component Value Date   HGB 14.6 08/01/2019   HCT 43.2 08/01/2019     Screening Lab Results  Component Value Date   STAPHAUREUS NEGATIVE 12/13/2014   MRSAPCR NEGATIVE 12/13/2014   HCVAB NEGATIVE 10/06/2016     Cancer No results found for: CEA, CA125, LABCA2   Allergens No results found for: ALMOND, APPLE, ASPARAGUS, AVOCADO, BANANA, BARLEY, BASIL, BAYLEAF, GREENBEAN, LIMABEAN, WHITEBEAN, BEEFIGE, REDBEET, BLUEBERRY, BROCCOLI, CABBAGE, MELON, CARROT, CASEIN, CASHEWNUT, CAULIFLOWER, CELERY     Note: Lab results reviewed.   Meds   Current Outpatient Medications:  .  amLODipine-valsartan (EXFORGE) 10-320 MG tablet, Take 1 tablet by mouth daily., Disp: 90 tablet, Rfl: 1 .  amoxicillin (AMOXIL) 500 MG capsule, TK 4 CS PO 1 HOUR BEFORE APPOINTMENT, Disp: , Rfl:  .  aspirin EC 81 MG tablet, Take 1 tablet (81 mg total) by mouth daily., Disp: 30 tablet, Rfl: 0 .  atorvastatin (LIPITOR) 20 MG tablet, Take 1 tablet (20 mg total) by mouth daily., Disp: 90 tablet, Rfl: 1 .  chlorhexidine (PERIDEX) 0.12 % solution, 15 mLs 2 (two) times daily., Disp: , Rfl:  .  linaclotide (LINZESS) 145 MCG CAPS capsule, Take 1 capsule (145 mcg total) by mouth daily before breakfast., Disp: 90 capsule, Rfl: 1 .  metFORMIN (GLUCOPHAGE-XR) 750 MG 24 hr tablet, Take 2 tablets (1,500 mg total) by mouth daily with breakfast., Disp: 180 tablet, Rfl: 1 .  omega-3 acid ethyl esters  (LOVAZA) 1 g capsule, Take 2 capsules (2 g total) by mouth daily., Disp: 180 capsule, Rfl: 0 .  [START ON 08/18/2019] oxyCODONE (OXYCONTIN) 40 mg 12 hr tablet, Take 1 tablet (40 mg total) by mouth every 12 (twelve) hours., Disp: 60 tablet, Rfl: 0 .  [START ON 09/17/2019] oxyCODONE (OXYCONTIN) 40 mg 12 hr tablet, Take 1 tablet (40 mg total) by mouth every 12 (twelve) hours., Disp: 60 tablet, Rfl: 0 .  [START ON 10/17/2019] oxyCODONE (OXYCONTIN) 40 mg 12 hr tablet, Take 1 tablet (40 mg total) by mouth every 12 (twelve) hours., Disp: 60 tablet, Rfl: 0 .  [START ON 08/19/2019] oxyCODONE-acetaminophen (PERCOCET) 10-325 MG tablet, Take 1 tablet by mouth 2 (two) times daily as needed for pain., Disp: 60 tablet, Rfl: 0 .  [START ON 09/18/2019] oxyCODONE-acetaminophen (PERCOCET) 10-325 MG tablet, Take 1 tablet by mouth 2 (two) times daily as needed for pain., Disp: 60 tablet, Rfl: 0 .  [START ON 10/18/2019] oxyCODONE-acetaminophen (PERCOCET) 10-325 MG tablet, Take 1 tablet by mouth 2 (two) times daily as needed for pain., Disp: 60 tablet, Rfl: 0 .  sildenafil (VIAGRA) 100 MG tablet, Take 1 tablet (100 mg total) by mouth daily as needed for erectile dysfunction., Disp: 30 tablet, Rfl: 0 .  tamsulosin (FLOMAX) 0.4 MG CAPS capsule, Take 1 capsule (0.4 mg total) by mouth daily., Disp: 90 capsule, Rfl: 1 .  tiZANidine (ZANAFLEX) 4 MG tablet, Take 1 tablet (4 mg total) by mouth 2 (two) times daily as needed for muscle spasms., Disp: 60 tablet, Rfl: 2  ROS  Constitutional: Denies any fever or chills Gastrointestinal: No reported hemesis, hematochezia, vomiting, or acute GI distress Musculoskeletal: Denies any acute onset joint swelling, redness, loss of ROM, or weakness Neurological: No reported episodes of acute onset apraxia, aphasia, dysarthria, agnosia, amnesia, paralysis, loss of coordination, or loss of consciousness  Allergies  Mr. Winegarden has No Known Allergies.  PFSH  Drug: Mr.  Williamsen  reports no history  of drug use. Alcohol:  reports no history of alcohol use. Tobacco:  reports that he quit smoking about 20 years ago. His smoking use included cigarettes. He has a 30.00 pack-year smoking history. He has never used smokeless tobacco. Medical:  has a past medical history of Arthritis and Hypertension. Surgical: Mr. Summer  has a past surgical history that includes Back surgery (lower back surgery x 3 ); Appendectomy (age 59); sebaceous cyst removed (Right, 15 years ago); Toe Fusion (Right); metal removed from eye (years ago); Total knee arthroplasty (Left, 08/10/2014); Total knee arthroplasty (Right, 12/21/2014); Total knee arthroplasty (Right, 12/21/14); Lumbar fusion (1999); Colonoscopy with propofol (N/A, 05/04/2017); and arthroscopic shoulder surgery (Right, 08/31/2017). Family: family history includes Cancer in his mother; Heart attack in his mother; Hyperlipidemia in his father; Hypertension in his father.  Constitutional Exam  General appearance: Well nourished, well developed, and well hydrated. In no apparent acute distress Vitals:   08/07/19 1241  BP: 131/81  Pulse: 82  Resp: 16  Temp: (!) 97.3 F (36.3 C)  TempSrc: Temporal  SpO2: 98%  Weight: 282 lb (127.9 kg)  Height: 6' (1.829 m)   BMI Assessment: Estimated body mass index is 38.25 kg/m as calculated from the following:   Height as of this encounter: 6' (1.829 m).   Weight as of this encounter: 282 lb (127.9 kg).  BMI interpretation table: BMI level Category Range association with higher incidence of chronic pain  <18 kg/m2 Underweight   18.5-24.9 kg/m2 Ideal body weight   25-29.9 kg/m2 Overweight Increased incidence by 20%  30-34.9 kg/m2 Obese (Class I) Increased incidence by 68%  35-39.9 kg/m2 Severe obesity (Class II) Increased incidence by 136%  >40 kg/m2 Extreme obesity (Class III) Increased incidence by 254%   Patient's current BMI Ideal Body weight  Body mass index is 38.25 kg/m. Ideal body weight: 77.6 kg (171 lb  1.2 oz) Adjusted ideal body weight: 97.7 kg (215 lb 7.1 oz)   BMI Readings from Last 4 Encounters:  08/07/19 38.25 kg/m  08/01/19 38.33 kg/m  05/17/19 38.21 kg/m  02/14/19 36.62 kg/m   Wt Readings from Last 4 Encounters:  08/07/19 282 lb (127.9 kg)  08/01/19 282 lb 9.6 oz (128.2 kg)  05/17/19 281 lb 11.2 oz (127.8 kg)  02/14/19 270 lb (122.5 kg)    Psych/Mental status: Alert, oriented x 3 (person, place, & time)       Eyes: PERLA Respiratory: No evidence of acute respiratory distress  Cervical Spine Exam  Skin & Axial Inspection: No masses, redness, edema, swelling, or associated skin lesions Alignment: Symmetrical Functional ROM: Unrestricted ROM      Stability: No instability detected Muscle Tone/Strength: Functionally intact. No obvious neuro-muscular anomalies detected. Sensory (Neurological): Unimpaired Palpation: No palpable anomalies              Upper Extremity (UE) Exam    Side: Right upper extremity  Side: Left upper extremity  Skin & Extremity Inspection: Skin color, temperature, and hair growth are WNL. No peripheral edema or cyanosis. No masses, redness, swelling, asymmetry, or associated skin lesions. No contractures.  Skin & Extremity Inspection: Skin color, temperature, and hair growth are WNL. No peripheral edema or cyanosis. No masses, redness, swelling, asymmetry, or associated skin lesions. No contractures.  Functional ROM: Unrestricted ROM          Functional ROM: Unrestricted ROM          Muscle Tone/Strength: Functionally intact. No obvious neuro-muscular anomalies detected.  Muscle Tone/Strength: Functionally intact. No obvious neuro-muscular anomalies detected.  Sensory (Neurological): Unimpaired          Sensory (Neurological): Unimpaired          Palpation: No palpable anomalies              Palpation: No palpable anomalies              Provocative Test(s):  Phalen's test: deferred Tinel's test: deferred Apley's scratch test (touch opposite  shoulder):  Action 1 (Across chest): deferred Action 2 (Overhead): deferred Action 3 (LB reach): deferred   Provocative Test(s):  Phalen's test: deferred Tinel's test: deferred Apley's scratch test (touch opposite shoulder):  Action 1 (Across chest): deferred Action 2 (Overhead): deferred Action 3 (LB reach): deferred    Thoracic Spine Area Exam  Skin & Axial Inspection: No masses, redness, or swelling Alignment: Symmetrical Functional ROM: Unrestricted ROM Stability: No instability detected Muscle Tone/Strength: Functionally intact. No obvious neuro-muscular anomalies detected. Sensory (Neurological): Unimpaired Muscle strength & Tone: No palpable anomalies  Lumbar Exam  Skin & Axial Inspection: Well healed scar from previous spine surgery detected Alignment: Symmetrical Functional ROM: Pain restricted ROM affecting both sides Stability: No instability detected Muscle Tone/Strength: Functionally intact. No obvious neuro-muscular anomalies detected. Sensory (Neurological): Dermatomal pain pattern   Gait & Posture Assessment  Ambulation: Unassisted Gait: Relatively normal for age and body habitus Posture: WNL   Lower Extremity Exam    Side: Right lower extremity  Side: Left lower extremity  Stability: No instability observed          Stability: No instability observed          Skin & Extremity Inspection: Skin color, temperature, and hair growth are WNL. No peripheral edema or cyanosis. No masses, redness, swelling, asymmetry, or associated skin lesions. No contractures.  Skin & Extremity Inspection: Skin color, temperature, and hair growth are WNL. No peripheral edema or cyanosis. No masses, redness, swelling, asymmetry, or associated skin lesions. No contractures.  Functional ROM: Pain restricted ROM for knee joint          Functional ROM: Pain restricted ROM for knee joint          Muscle Tone/Strength: Functionally intact. No obvious neuro-muscular anomalies detected.   Muscle Tone/Strength: Functionally intact. No obvious neuro-muscular anomalies detected.  Sensory (Neurological): Arthropathic arthralgia        Sensory (Neurological): Arthropathic arthralgia        DTR: Patellar: deferred today Achilles: deferred today Plantar: deferred today  DTR: Patellar: deferred today Achilles: deferred today Plantar: deferred today  Palpation: No palpable anomalies  Palpation: No palpable anomalies   Assessment   Status Diagnosis  Controlled Controlled Controlled 1. S/P lumbar fusion   2. Chronic pain syndrome   3. Chronic right-sided low back pain without sciatica   4. Lumbar degenerative disc disease   5. Long term prescription opiate use   6. Chronic use of opiate for therapeutic purpose   7. Lumbar spondylosis       Plan of Care  Pharmacotherapy (Medications Ordered): Meds ordered this encounter  Medications  . oxyCODONE (OXYCONTIN) 40 mg 12 hr tablet    Sig: Take 1 tablet (40 mg total) by mouth every 12 (twelve) hours.    Dispense:  60 tablet    Refill:  0    Do not place this medication, or any other prescription from our practice, on "Automatic Refill". Patient may have prescription filled one day early if pharmacy is closed on scheduled  refill date.  Marland Kitchen oxyCODONE (OXYCONTIN) 40 mg 12 hr tablet    Sig: Take 1 tablet (40 mg total) by mouth every 12 (twelve) hours.    Dispense:  60 tablet    Refill:  0    Do not place this medication, or any other prescription from our practice, on "Automatic Refill". Patient may have prescription filled one day early if pharmacy is closed on scheduled refill date.  Marland Kitchen oxyCODONE (OXYCONTIN) 40 mg 12 hr tablet    Sig: Take 1 tablet (40 mg total) by mouth every 12 (twelve) hours.    Dispense:  60 tablet    Refill:  0    Do not place this medication, or any other prescription from our practice, on "Automatic Refill". Patient may have prescription filled one day early if pharmacy is closed on scheduled refill date.   Marland Kitchen oxyCODONE-acetaminophen (PERCOCET) 10-325 MG tablet    Sig: Take 1 tablet by mouth 2 (two) times daily as needed for pain.    Dispense:  60 tablet    Refill:  0  . oxyCODONE-acetaminophen (PERCOCET) 10-325 MG tablet    Sig: Take 1 tablet by mouth 2 (two) times daily as needed for pain.    Dispense:  60 tablet    Refill:  0  . oxyCODONE-acetaminophen (PERCOCET) 10-325 MG tablet    Sig: Take 1 tablet by mouth 2 (two) times daily as needed for pain.    Dispense:  60 tablet    Refill:  0  . tiZANidine (ZANAFLEX) 4 MG tablet    Sig: Take 1 tablet (4 mg total) by mouth 2 (two) times daily as needed for muscle spasms.    Dispense:  60 tablet    Refill:  2   Planned follow-up:   Return in about 3 months (around 11/07/2019) for Medication Management, in person.   Recent Visits Date Type Provider Dept  05/16/19 Office Visit Gillis Santa, MD [facility]  Showing recent visits within past 90 days and meeting all other requirements   Today's Visits Date Type Provider Dept  08/07/19 Office Visit Gillis Santa, MD [facility]  Showing today's visits and meeting all other requirements   Future Appointments No visits were found meeting these conditions.  Showing future appointments within next 90 days and meeting all other requirements   Primary Care Physician: Steele Sizer, MD Location: [facility] Note by: Gillis Santa, MD Date: 08/07/2019; Time: 1:16 PM  Note: This dictation was prepared with Dragon dictation. Any transcriptional errors that may result from this process are unintentional.

## 2019-08-07 NOTE — Progress Notes (Signed)
Nursing Pain Medication Assessment:  Safety precautions to be maintained throughout the outpatient stay will include: orient to surroundings, keep bed in low position, maintain call bell within reach at all times, provide assistance with transfer out of bed and ambulation.  Medication Inspection Compliance: Pill count conducted under aseptic conditions, in front of the patient. Neither the pills nor the bottle was removed from the patient's sight at any time. Once count was completed pills were immediately returned to the patient in their original bottle.  Medication #1: Oxycodone ER (OxyContin) Pill/Patch Count: 25 of 60 pills remain Pill/Patch Appearance: Markings consistent with prescribed medication Bottle Appearance: Standard pharmacy container. Clearly labeled. Filled Date: 04 / 15 / 2021 Last Medication intake:  Today  Medication #2: Oxycodone/APAP Pill/Patch Count: 27 of 60 pills remain Pill/Patch Appearance: Markings consistent with prescribed medication Bottle Appearance: Standard pharmacy container. Clearly labeled. Filled Date: 04 / 16 / 2021 Last Medication intake:  Today

## 2019-08-07 NOTE — Patient Instructions (Signed)
You were given 3 prescriptions each for Oxycontin, Percocet, and one for Tizanidine with refills

## 2019-08-10 ENCOUNTER — Encounter: Payer: 59 | Admitting: Student in an Organized Health Care Education/Training Program

## 2019-08-15 ENCOUNTER — Ambulatory Visit: Payer: 59

## 2019-08-15 ENCOUNTER — Other Ambulatory Visit: Payer: Self-pay

## 2019-08-15 VITALS — BP 140/78 | HR 71

## 2019-08-15 DIAGNOSIS — I10 Essential (primary) hypertension: Secondary | ICD-10-CM

## 2019-10-31 ENCOUNTER — Other Ambulatory Visit: Payer: Self-pay

## 2019-10-31 ENCOUNTER — Encounter: Payer: Self-pay | Admitting: Student in an Organized Health Care Education/Training Program

## 2019-10-31 ENCOUNTER — Ambulatory Visit
Payer: 59 | Attending: Student in an Organized Health Care Education/Training Program | Admitting: Student in an Organized Health Care Education/Training Program

## 2019-10-31 VITALS — BP 160/77 | HR 88 | Temp 97.3°F | Resp 18 | Ht 72.0 in | Wt 262.0 lb

## 2019-10-31 DIAGNOSIS — G894 Chronic pain syndrome: Secondary | ICD-10-CM | POA: Diagnosis present

## 2019-10-31 DIAGNOSIS — M545 Low back pain: Secondary | ICD-10-CM | POA: Diagnosis present

## 2019-10-31 DIAGNOSIS — Z981 Arthrodesis status: Secondary | ICD-10-CM | POA: Insufficient documentation

## 2019-10-31 DIAGNOSIS — G8929 Other chronic pain: Secondary | ICD-10-CM | POA: Insufficient documentation

## 2019-10-31 DIAGNOSIS — Z79891 Long term (current) use of opiate analgesic: Secondary | ICD-10-CM | POA: Insufficient documentation

## 2019-10-31 DIAGNOSIS — M5136 Other intervertebral disc degeneration, lumbar region: Secondary | ICD-10-CM | POA: Diagnosis present

## 2019-10-31 MED ORDER — OXYCODONE-ACETAMINOPHEN 10-325 MG PO TABS: 1.0000 | ORAL_TABLET | Freq: Two times a day (BID) | ORAL | 0 refills | Status: DC | PRN

## 2019-10-31 MED ORDER — OXYCODONE HCL ER 40 MG PO T12A: 40.0000 mg | EXTENDED_RELEASE_TABLET | Freq: Two times a day (BID) | ORAL | 0 refills | Status: DC

## 2019-10-31 MED ORDER — TIZANIDINE HCL 4 MG PO TABS: 4.0000 mg | ORAL_TABLET | Freq: Two times a day (BID) | ORAL | 2 refills | Status: DC | PRN

## 2019-10-31 MED ORDER — NALOXONE HCL 2 MG/2ML IJ SOSY: 1.0000 mg | PREFILLED_SYRINGE | INTRAMUSCULAR | 1 refills | Status: AC | PRN

## 2019-10-31 NOTE — Progress Notes (Signed)
Nursing Pain Medication Assessment:  Safety precautions to be maintained throughout the outpatient stay will include: orient to surroundings, keep bed in low position, maintain call bell within reach at all times, provide assistance with transfer out of bed and ambulation.  Medication Inspection Compliance: Pill count conducted under aseptic conditions, in front of the patient. Neither the pills nor the bottle was removed from the patient's sight at any time. Once count was completed pills were immediately returned to the patient in their original bottle.  Medication #1: Oxycodone/APAP Pill/Patch Count: 36 of 60 pills remain Pill/Patch Appearance: Markings consistent with prescribed medication Bottle Appearance: Standard pharmacy container. Clearly labeled. Filled Date:10/18/2019 Last Medication intake:  Today  Medication #2: Oxycodone ER (OxyContin) Pill/Patch Count: 34 of 60 pills remain Pill/Patch Appearance: Markings consistent with prescribed medication Bottle Appearance: Standard pharmacy container. Clearly labeled. Filled Date:10/17/19 Last Medication intake:  Today

## 2019-10-31 NOTE — Progress Notes (Signed)
PROVIDER NOTE: Information contained herein reflects review and annotations entered in association with encounter. Interpretation of such information and data should be left to medically-trained personnel. Information provided to patient can be located elsewhere in the medical record under "Patient Instructions". Document created using STT-dictation technology, any transcriptional errors that may result from process are unintentional.    Patient: Lawrence Thornton.  Service Category: E/M  Provider: Gillis Santa, MD  DOB: 03-16-1956  DOS: 10/31/2019  Specialty: Interventional Pain Management  MRN: [id number]  Setting: Ambulatory outpatient  PCP: Steele Sizer, MD  Type: Established Patient    Referring Provider: Steele Sizer, MD  Location: Office  Delivery: Face-to-face     HPI  Reason for encounter: [name]., a 64 y.o. year old, a 64 y.o. year old male, is here today for evaluation and management of his Chronic pain syndrome [G89.4]. [name] primary complain today is Back Pain (right lower) Last encounter: Practice (08/07/2019). My last encounter with him was on 08/07/2019. Pertinent problems: [name] has Chronic right-sided low back pain without sciatica; Continuous opioid dependence ([facility]); S/P knee replacement; S/P lumbar fusion; Osteoarthritis of acromioclavicular joint; Long term current use of opiate analgesic; Lumbar spondylosis; Morbid obesity ([place]); and Long term prescription opiate use on their pertinent problem list. Pain Assessment: Severity of Chronic pain is reported as a 4 /10. Location: Back Right, Lower/does not radiate. Onset: More than a month ago. Quality: Dull. Timing: Constant. Modifying factor(s): medication, exercises. Vitals:  height is 6' (1.829 m) and weight is 262 lb (118.8 kg) (abnormal). His temporal temperature is 97.3 F (36.3 C) (abnormal). His blood pressure is 160/77 (abnormal) and his pulse is 88. His respiration is 18 and oxygen saturation is 100%.    No change in medical history since last visit.  Patient's pain is at baseline.  Patient continues multimodal pain regimen as prescribed.  States that it provides pain relief and improvement in functional status.   Pharmacotherapy Assessment   Analgesic:  10/18/2019  1   08/07/2019  Oxycodone-Acetaminophen 10-325  60.00  30 Bi Lat   [id number]   [facility] (0252)   0/0  30.00 MME  Comm Ins   [place]  10/17/2019  1   08/07/2019  Oxycontin Er 40 MG Tablet  60.00  30 Bi Lat   [id number]   [facility] (0252)   0/0  120.00 MME  Comm Ins   [place]      Monitoring: [place] PMP: PDMP reviewed during this encounter.       Pharmacotherapy: No side-effects or adverse reactions reported. Compliance: No problems identified. Effectiveness: Clinically acceptable.  Lawrence Martins, RN  10/31/2019  8:04 AM  Sign when Signing Visit Nursing Pain Medication Assessment:  Safety precautions to be maintained throughout the outpatient stay will include: orient to surroundings, keep bed in low position, maintain call bell within reach at all times, provide assistance with transfer out of bed and ambulation.  Medication Inspection Compliance: Pill count conducted under aseptic conditions, in front of the patient. Neither the pills nor the bottle was removed from the patient's sight at any time. Once count was completed pills were immediately returned to the patient in their original bottle.  Medication #1: Oxycodone/APAP Pill/Patch Count: 36 of 60 pills remain Pill/Patch Appearance: Markings consistent with prescribed medication Bottle Appearance: Standard pharmacy container. Clearly labeled. Filled Date:10/18/2019 Last Medication intake:  Today  Medication #2: Oxycodone ER (OxyContin) Pill/Patch Count: 34 of 60 pills remain Pill/Patch Appearance: Markings consistent with prescribed medication Bottle Appearance: Standard  pharmacy container. Clearly labeled. Filled Date:10/17/19 Last Medication intake:  Today    UDS:  Summary  Date Value  Ref Range Status  02/14/2019 Note  Final    Comment:    ==================================================================== [employer]ox[employer] Select 13 (MW) ==================================================================== Test                             Result       Flag       Units Drug Present and Declared for Prescription Verification   Oxycodone                      1746         EXPECTED   ng/mg creat   Oxymorphone                    3272         EXPECTED   ng/mg creat   Noroxycodone                   3196         EXPECTED   ng/mg creat   Noroxymorphone                 851          EXPECTED   ng/mg creat    Sources of oxycodone are scheduled prescription medications.    Oxymorphone, noroxycodone, and noroxymorphone are expected    metabolites of oxycodone. Oxymorphone is also available as a    scheduled prescription medication. ==================================================================== Test                      Result    Flag   Units      Ref Range   Creatinine              112              mg/dL      >=20 ==================================================================== Declared Medications:  The flagging and interpretation on this report are based on the  following declared medications.  Unexpected results may arise from  inaccuracies in the declared medications.  **Note: The testing scope of this panel includes these medications:  Oxycodone  **Note: The testing scope of this panel does not include the  following reported medications:  Acetaminophen  Amlodipine  Amoxicillin  Aspirin  Atorvastatin  Azithromycin  Chlorhexidine  Linaclotide  Metformin  Omega-3 Fatty Acids  Sildenafil  Tamsulosin  Tizanidine  Valsartan ==================================================================== For clinical consultation, please call [phone]. ====================================================================      ROS  Constitutional: Denies any fever or  chills Gastrointestinal: No reported hemesis, hematochezia, vomiting, or acute GI distress Musculoskeletal: +LBP Neurological: No reported episodes of acute onset apraxia, aphasia, dysarthria, agnosia, amnesia, paralysis, loss of coordination, or loss of consciousness  Medication Review  amLODipine-valsartan, aspirin EC, atorvastatin, linaclotide, metFORMIN, naloxone, omega-3 acid ethyl esters, oxyCODONE, oxyCODONE-acetaminophen, sildenafil, tamsulosin, and tiZANidine  History Review  Allergy: Lawrence Thornton has No Known Allergies. Drug: Lawrence Thornton  reports no history of drug use. Alcohol:  reports no history of alcohol use. Tobacco:  reports that he quit smoking about 20 years ago. His smoking use included cigarettes. He has a 30.00 pack-year smoking history. He has never used smokeless tobacco. Social: Mr. Legan  reports that he quit smoking about 20 years ago. His smoking use included cigarettes. He has a 30.00 pack-year smoking history. He has never  used smokeless tobacco. He reports that he does not drink alcohol and does not use drugs. Medical:  has a past medical history of Arthritis and Hypertension. Surgical: Lawrence Thornton  has a past surgical history that includes Back surgery (lower back surgery x 3 ); Appendectomy (age 38); sebaceous cyst removed (Right, 15 years ago); Toe Fusion (Right); metal removed from eye (years ago); Total knee arthroplasty (Left, 08/10/2014); Total knee arthroplasty (Right, 12/21/2014); Total knee arthroplasty (Right, 12/21/14); Lumbar fusion (1999); Colonoscopy with propofol (N/A, 05/04/2017); and arthroscopic shoulder surgery (Right, 08/31/2017). Family: family history includes Cancer in his mother; Heart attack in his mother; Hyperlipidemia in his father; Hypertension in his father.  Laboratory Chemistry Profile   Renal Lab Results  Component Value Date   BUN 11 08/01/2019   CREATININE 0.75 08/01/2019   LABCREA 96 47/65/4650   BCR NOT APPLICABLE  35/46/5681   GFRAA 113 08/01/2019   GFRNONAA 98 08/01/2019     Hepatic Lab Results  Component Value Date   AST 33 08/01/2019   ALT 42 08/01/2019   ALBUMIN 4.6 11/11/2015   ALKPHOS 76 11/11/2015   HCVAB NEGATIVE 10/06/2016     Electrolytes Lab Results  Component Value Date   NA 138 08/01/2019   K 4.6 08/01/2019   CL 101 08/01/2019   CALCIUM 10.2 08/01/2019     Bone Lab Results  Component Value Date   VD25OH 43 10/06/2016     Inflammation (CRP: Acute Phase) (ESR: Chronic Phase) No results found for: CRP, ESRSEDRATE, LATICACIDVEN     Note: Above Lab results reviewed.  Recent Imaging Review  Note: Reviewed        Physical Exam  General appearance: Well nourished, well developed, and well hydrated. In no apparent acute distress Mental status: Alert, oriented x 3 (person, place, & time)       Respiratory: No evidence of acute respiratory distress Eyes: PERLA Vitals: BP (!) 160/77 (BP Location: Left Arm, Patient Position: Sitting, Cuff Size: Large)   Pulse 88   Temp (!) 97.3 F (36.3 C) (Temporal)   Resp 18   Ht 6' (1.829 m)   Wt (!) 262 lb (118.8 kg)   SpO2 100%   BMI 35.53 kg/m  BMI: Estimated body mass index is 35.53 kg/m as calculated from the following:   Height as of this encounter: 6' (1.829 m).   Weight as of this encounter: 262 lb (118.8 kg). Ideal: Ideal body weight: 77.6 kg (171 lb 1.2 oz) Adjusted ideal body weight: 94.1 kg (207 lb 7.1 oz)  Lumbar Spine Area Exam  Skin & Axial Inspection: Well healed scar from previous spine surgery detected Alignment: Symmetrical Functional ROM: Pain restricted ROM affecting both sides Stability: No instability detected Muscle Tone/Strength: Functionally intact. No obvious neuro-muscular anomalies detected. Sensory (Neurological): Myotome pain pattern  Gait & Posture Assessment  Ambulation: Unassisted Gait: Relatively normal for age and body habitus Posture: WNL  Lower Extremity Exam    Side: Right lower  extremity  Side: Left lower extremity  Stability: No instability observed          Stability: No instability observed          Skin & Extremity Inspection: Skin color, temperature, and hair growth are WNL. No peripheral edema or cyanosis. No masses, redness, swelling, asymmetry, or associated skin lesions. No contractures.  Skin & Extremity Inspection: Skin color, temperature, and hair growth are WNL. No peripheral edema or cyanosis. No masses, redness, swelling, asymmetry, or associated skin lesions. No contractures.  Functional ROM: Unrestricted ROM                  Functional ROM: Unrestricted ROM                  Muscle Tone/Strength: Functionally intact. No obvious neuro-muscular anomalies detected.  Muscle Tone/Strength: Functionally intact. No obvious neuro-muscular anomalies detected.  Sensory (Neurological): Unimpaired        Sensory (Neurological): Unimpaired        DTR: Patellar: deferred today Achilles: deferred today Plantar: deferred today  DTR: Patellar: deferred today Achilles: deferred today Plantar: deferred today  Palpation: No palpable anomalies  Palpation: No palpable anomalies    Assessment   Status Diagnosis  Controlled Controlled Controlled 1. Chronic pain syndrome   2. S/P lumbar fusion   3. Chronic right-sided low back pain without sciatica   4. Lumbar degenerative disc disease   5. Long term prescription opiate use   6. Chronic use of opiate for therapeutic purpose      Updated Problems: Problem  Long Term Prescription Opiate Use  Morbid Obesity ([facility])  Lumbar Spondylosis  Long Term Current Use of Opiate Analgesic  Osteoarthritis of Acromioclavicular Joint  S/P Lumbar Fusion  S/P Knee Replacement  Chronic Right-Sided Low Back Pain Without Sciatica  Continuous opioid dependence ([facility])    Plan of Care  [name]. has a current medication list which includes the following long-term medication(s): amlodipine-valsartan, atorvastatin,  linaclotide, metformin, omega-3 acid ethyl esters, and sildenafil.  Pharmacotherapy (Medications Ordered): Meds ordered this encounter  Medications  . oxyCODONE (OXYCONTIN) 40 mg 12 hr tablet    Sig: Take 1 tablet (40 mg total) by mouth every 12 (twelve) hours.    Dispense:  60 tablet    Refill:  0    Do not place this medication, or any other prescription from our practice, on "Automatic Refill". Patient may have prescription filled one day early if pharmacy is closed on scheduled refill date.  Marland Kitchen oxyCODONE-acetaminophen (PERCOCET) 10-325 MG tablet    Sig: Take 1 tablet by mouth 2 (two) times daily as needed for pain.    Dispense:  60 tablet    Refill:  0  . oxyCODONE (OXYCONTIN) 40 mg 12 hr tablet    Sig: Take 1 tablet (40 mg total) by mouth every 12 (twelve) hours.    Dispense:  60 tablet    Refill:  0    Do not place this medication, or any other prescription from our practice, on "Automatic Refill". Patient may have prescription filled one day early if pharmacy is closed on scheduled refill date.  Marland Kitchen oxyCODONE (OXYCONTIN) 40 mg 12 hr tablet    Sig: Take 1 tablet (40 mg total) by mouth every 12 (twelve) hours.    Dispense:  60 tablet    Refill:  0    Do not place this medication, or any other prescription from our practice, on "Automatic Refill". Patient may have prescription filled one day early if pharmacy is closed on scheduled refill date.  Marland Kitchen oxyCODONE-acetaminophen (PERCOCET) 10-325 MG tablet    Sig: Take 1 tablet by mouth 2 (two) times daily as needed for pain.    Dispense:  60 tablet    Refill:  0  . oxyCODONE-acetaminophen (PERCOCET) 10-325 MG tablet    Sig: Take 1 tablet by mouth 2 (two) times daily as needed for pain.    Dispense:  60 tablet    Refill:  0  . tiZANidine (ZANAFLEX)  4 MG tablet    Sig: Take 1 tablet (4 mg total) by mouth 2 (two) times daily as needed for muscle spasms.    Dispense:  60 tablet    Refill:  2  . naloxone (NARCAN) 2 MG/2ML injection    Sig:  Inject 1 mL (1 mg total) into the muscle as needed for up to 2 doses (for opioid overdose). Inject content of syringe into thigh muscle. Call 911.    Dispense:  2 mL    Refill:  1    NDC # [id number]. Please teach proper use of device.   Orders:  Orders Placed This Encounter  Procedures  . ToxASSURE Select 13 (MW), Urine    Volume: 30 ml(s). Minimum 3 ml of urine is needed. Document temperature of fresh sample. Indications: Long term (current) use of opiate analgesic [phone])    Order Specific Question:   Release to patient    Answer:   Immediate   Follow-up plan:   Return in about 3 months (around 02/08/2020) for Medication Management, in person.   Recent Visits Date Type Provider Dept  08/07/19 Office Visit Gillis Santa, MD [facility]  Showing recent visits within past 90 days and meeting all other requirements Today's Visits Date Type Provider Dept  10/31/19 Office Visit Gillis Santa, MD [facility]  Showing today's visits and meeting all other requirements Future Appointments No visits were found meeting these conditions. Showing future appointments within next 90 days and meeting all other requirements  I discussed the assessment and treatment plan with the patient. The patient was provided an opportunity to ask questions and all were answered. The patient agreed with the plan and demonstrated an understanding of the instructions.  Patient advised to call back or seek an in-person evaluation if the symptoms or condition worsens.  Duration of encounter: 35 minutes.  Note by: Gillis Santa, MD Date: 10/31/2019; Time: 8:41 AM

## 2019-10-31 NOTE — Patient Instructions (Signed)
A prescription for Tizanidine and Narcan, and 3 each for Percocet and Oxycontin have been sent to your pharmacy.

## 2019-11-01 ENCOUNTER — Telehealth: Payer: Self-pay | Admitting: *Deleted

## 2019-11-01 NOTE — Telephone Encounter (Signed)
No notes needed

## 2019-11-03 LAB — TOXASSURE SELECT 13 (MW), URINE

## 2019-11-06 ENCOUNTER — Other Ambulatory Visit: Payer: Self-pay | Admitting: Family Medicine

## 2019-11-06 DIAGNOSIS — E1169 Type 2 diabetes mellitus with other specified complication: Secondary | ICD-10-CM

## 2019-11-06 DIAGNOSIS — N401 Enlarged prostate with lower urinary tract symptoms: Secondary | ICD-10-CM

## 2019-11-06 DIAGNOSIS — R351 Nocturia: Secondary | ICD-10-CM

## 2019-12-01 ENCOUNTER — Ambulatory Visit: Payer: 59 | Admitting: Family Medicine

## 2020-01-01 NOTE — Progress Notes (Signed)
Name: Lawrence Thornton.   MRN: [id number]    DOB: January 17, 1956   Date:01/02/2020       Progress Note  Subjective  Chief Complaint  Chief Complaint  Patient presents with  . Diabetes  . Dyslipidemia  . Hypertension  . Obesity    HPI  DMII:diagnosed May 2019 with a A1C was 6.9% he is on Metformin and A1C is down to 5.9 %- we will go down to 750 mg daily instead of 1500  He denies polydipsia ( but states likes water), no polyphagia, has nocturia from BPH.He is due for eye exam . Denies blurred vision. He has high triglycerides andhe wasgemfibrozil, but we switched from gemfibrozil to Lovaza2 at night because that is how he can tolerate and also Atorvastatin daily. He has Viagra and takes it prn only.  Chronic pain and opioid use: he was given Linzessand only takes it prn.He states bowel movements are now daily, but states needs to take medication occasionally to help him have a bowel movement. He is under the care of NP [facility] and Dr. Cherylann Ratel, pain level now is 3/10right lower back. He is complaint with his visits and takes medication as prescribed He learned how to removed socks with his feet and also uses a step to put his shoes and socks on.   Morbid obesity: BMI above 35He has multiple co-morbidities: DM, HTN, hyperlipidemia.He has gained another 7 lbs since last visit  He is busy at work , he states not walking his dog when he gets home. 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 BP is better today on Exforge 10/320   HTN:bp improved , no chest pain, palpitation or SOB. No side effects of medication    Patient Active Problem List   Diagnosis Date Noted  . Long term prescription opiate use 12/15/2018  . Morbid obesity (HCC) 07/28/2018  . Lumbar spondylosis 03/15/2018   . Long term current use of opiate analgesic 12/15/2017  . Dyslipidemia associated with type 2 diabetes mellitus (HCC) 08/13/2017  . Osteoarthritis of acromioclavicular joint 07/30/2017  . S/P lumbar fusion 07/13/2017  . Chronic pain syndrome 07/13/2017  . Personal history of colonic polyps   . Constipation due to opioid therapy 07/18/2015  . Hypertriglyceridemia 01/22/2015  . Extreme obesity 01/22/2015  . S/P knee replacement 12/21/2014  . Chronic right-sided low back pain without sciatica 09/21/2014  . BPH associated with nocturia 09/21/2014  . Dyslipidemia 09/21/2014  . Continuous opioid dependence (HCC) 09/21/2014  . Lumbar degenerative disc disease 09/21/2014  . Failure of erection 09/21/2014    Past Surgical History:  Procedure Laterality Date  . APPENDECTOMY  age 51  . arthroscopic shoulder surgery Right 08/31/2017  . BACK SURGERY  lower back surgery x 3    L 3 L 4 and L 5 are fused together  . COLONOSCOPY WITH PROPOFOL N/A 05/04/2017   Procedure: COLONOSCOPY WITH PROPOFOL;  Surgeon: Midge Minium, MD;  Location[facility] ENDOSCOPY;  Service: Endoscopy;  Laterality: N/A;  . LUMBAR FUSION  1999   L- to L5  . metal removed from eye  years ago  . sebaceous cyst removed Right 15 years ago   middle  finger   . TOE FUSION Right    4th toe  . TOTAL KNEE ARTHROPLASTY Left 08/10/2014  Procedure: LEFT TOTAL KNEE ARTHROPLASTY;  Surgeon: Eugenia Mcalpine, MD;  Location: [facility] ORS;  Service: Orthopedics;  Laterality: Left;  . TOTAL KNEE ARTHROPLASTY Right 12/21/2014   Procedure: RIGHT TOTAL KNEE ARTHROPLASTY;  Surgeon: Eugenia Mcalpine, MD;  Location: [facility] ORS;  Service: Orthopedics;  Laterality: Right;  . TOTAL KNEE ARTHROPLASTY Right 12/21/14    Family History  Problem Relation Age of Onset  . Cancer Mother        Throat  . Heart attack Mother   . Hypertension Father   . Hyperlipidemia Father     Social History   Tobacco Use  . Smoking status: Former Smoker    Packs/day: 1.50     Years: 20.00    Pack years: 30.00    Types: Cigarettes    Quit date: 04/07/1999    Years since quitting: 20.7  . Smokeless tobacco: Never Used  . Tobacco comment: not smoking   Substance Use Topics  . Alcohol use: No     Current Outpatient Medications:  .  amLODipine-valsartan (EXFORGE) 10-320 MG tablet, Take 1 tablet by mouth daily., Disp: 90 tablet, Rfl: 1 .  aspirin EC 81 MG tablet, Take 1 tablet (81 mg total) by mouth daily., Disp: 30 tablet, Rfl: 0 .  atorvastatin (LIPITOR) 20 MG tablet, TAKE 1 TABLET(20 MG) BY MOUTH DAILY, Disp: 90 tablet, Rfl: 1 .  linaclotide (LINZESS) 145 MCG CAPS capsule, Take 1 capsule (145 mcg total) by mouth daily before breakfast., Disp: 90 capsule, Rfl: 1 .  metFORMIN (GLUCOPHAGE-XR) 750 MG 24 hr tablet, TAKE 2 TABLETS(1500 MG) BY MOUTH DAILY WITH BREAKFAST, Disp: 180 tablet, Rfl: 1 .  naloxone (NARCAN) 2 MG/2ML injection, Inject 1 mL (1 mg total) into the muscle as needed for up to 2 doses (for opioid overdose). Inject content of syringe into thigh muscle. Call 911., Disp: 2 mL, Rfl: 1 .  omega-3 acid ethyl esters (LOVAZA) 1 g capsule, Take 2 capsules (2 g total) by mouth daily., Disp: 180 capsule, Rfl: 0 .  oxyCODONE (OXYCONTIN) 40 mg 12 hr tablet, Take 1 tablet (40 mg total) by mouth every 12 (twelve) hours., Disp: 60 tablet, Rfl: 0 .  oxyCODONE-acetaminophen (PERCOCET) 10-325 MG tablet, Take 1 tablet by mouth 2 (two) times daily as needed for pain., Disp: 60 tablet, Rfl: 0 .  [START ON 01/15/2020] oxyCODONE-acetaminophen (PERCOCET) 10-325 MG tablet, Take 1 tablet by mouth 2 (two) times daily as needed for pain., Disp: 60 tablet, Rfl: 0 .  sildenafil (VIAGRA) 100 MG tablet, Take 1 tablet (100 mg total) by mouth daily as needed for erectile dysfunction., Disp: 30 tablet, Rfl: 0 .  tamsulosin (FLOMAX) 0.4 MG CAPS capsule, TAKE 1 CAPSULE(0.4 MG) BY MOUTH DAILY, Disp: 90 capsule, Rfl: 1 .  tiZANidine (ZANAFLEX) 4 MG tablet, Take 1 tablet (4 mg total) by mouth 2  (two) times daily as needed for muscle spasms., Disp: 60 tablet, Rfl: 2  No Known Allergies  I personally reviewed active problem list, medication list, allergies, family history, social history, health maintenance with the patient/caregiver today.   ROS  Constitutional: Negative for fever , postiive for  weight change.  Respiratory: Negative for cough and shortness of breath.   Cardiovascular: Negative for chest pain or palpitations.  Gastrointestinal: Negative for abdominal pain, no bowel changes.  Musculoskeletal: Negative for gait problem or joint swelling.  Skin: Negative for rash.  Neurological: Negative for dizziness or headache.  No other specific complaints in a complete review of systems (except as listed in  HPI above).   Objective  Vitals:   01/02/20 0849  BP: 140/70  Pulse: 84  Resp: 16  Temp: 98.1 F (36.7 C)  TempSrc: Oral  SpO2: 96%  Weight: 269 lb 14.4 oz (122.4 kg)  Height: 6' (1.829 m)    Body mass index is 36.61 kg/m.  Physical Exam  Constitutional: Patient appears well-developed and well-nourished. Obese No distress.  HEENT: head atraumatic, normocephalic, pupils equal and reactive to light, neck supple Cardiovascular: Normal rate, regular rhythm and normal heart sounds.  No murmur heard. No BLE edema. Pulmonary/Chest: Effort normal and breath sounds normal. No respiratory distress. Muscular Skeletal: decrease rom of his spine  Abdominal: Soft.  There is no tenderness. Psychiatric: Patient has a normal mood and affect. behavior is normal. Judgment and thought content normal.  Recent Results (from the past 2160 hour(s))  ToxASSURE Select 13 (MW), Urine     Status: None   Collection Time: 10/31/19 11:11 AM  Result Value Ref Range   Summary Note     Comment: ==================================================================== ToxAS[employer] Select 13 (MW) ==================================================================== Test                              Result       Flag       Units  Drug Present and Declared for Prescription Verification   Oxycodone                      4278         EXPECTED   ng/mg creat   Oxymorphone                    4669         EXPECTED   ng/mg creat   Noroxycodone                   4769         EXPECTED   ng/mg creat   Noroxymorphone                 1364         EXPECTED   ng/mg creat    Sources of oxycodone are scheduled prescription medications.    Oxymorphone, noroxycodone, and noroxymorphone are expected    metabolites of oxycodone. Oxymorphone is also available as a    scheduled prescription medication.  ==================================================================== Test                      Result    Flag   Units      Ref Range   Creatinine              135              mg/dL      >=16>=20 == ================================================================== Declared Medications:  The flagging and interpretation on this report are based on the  following declared medications.  Unexpected results may arise from  inaccuracies in the declared medications.   **Note: The testing scope of this panel includes these medications:   Oxycodone (Oxycontin)  Oxycodone (Percocet)   **Note: The testing scope of this panel does not include the  following reported medications:   Acetaminophen (Percocet)  Amlodipine (Exforge)  Aspirin  Atorvastatin (Lipitor)  Linaclotide (Linzess)  Metformin  Naloxone (Narcan)  Omega-3 Fatty Acids  Sildenafil (Viagra)  Tamsulosin (Flomax)  Tizanidine (Zanaflex)  Valsartan (Exforge) ==================================================================== For clinical consultation,  please call [phone]. ====================================================================   POCT HgB A1C     Status: Normal   Collection Time: 01/02/20  8:54 AM  Result Value Ref Range   Hemoglobin A1C 5.6 4.0 - 5.6 %   HbA1c POC (<> result, manual entry)     HbA1c, POC (prediabetic  range)     HbA1c, POC (controlled diabetic range)      Diabetic Foot Exam: Diabetic Foot Exam - Simple   Simple Foot Form Diabetic Foot exam was performed with the following findings: Yes 01/02/2020  9:15 AM  Visual Inspection See comments: Yes Sensation Testing Intact to touch and monofilament testing bilaterally: Yes Pulse Check Posterior Tibialis and Dorsalis pulse intact bilaterally: Yes Comments Callus formation of big toe       PHQ2/9: Depression screen [facility] 2/9 01/02/2020 10/31/2019 08/01/2019 05/17/2019 01/31/2019  Decreased Interest 0 0 0 0 0  Down, Depressed, Hopeless 0 0 0 0 0  PHQ - 2 Score 0 0 0 0 0  Altered sleeping - - 0 0 1  Tired, decreased energy - - 0 0 0  Change in appetite - - 0 0 0  Feeling bad or failure about yourself  - - 0 0 0  Trouble concentrating - - 0 0 0  Moving slowly or fidgety/restless - - 0 0 0  Suicidal thoughts - - 0 0 0  PHQ-9 Score - - 0 0 1  Difficult doing work/chores - - - Not difficult at all Not difficult at all  Some recent data might be hidden    phq 9 is negative   Fall Risk: Fall Risk  01/02/2020 10/31/2019 08/07/2019 08/01/2019 05/17/2019  Falls in the past year? 0 0 0 0 0  Number falls in past yr: 0 0 - 0 0  Injury with Fall? 0 0 - 0 0  Follow up - - Falls evaluation completed - Falls evaluation completed    Functional Status Survey: Is the patient deaf or have difficulty hearing?: Yes Does the patient have difficulty seeing, even when wearing glasses/contacts?: No Does the patient have difficulty concentrating, remembering, or making decisions?: No Does the patient have difficulty walking or climbing stairs?: No Does the patient have difficulty dressing or bathing?: No Does the patient have difficulty doing errands alone such as visiting a doctor's office or shopping?: No    Assessment & Plan  1. Dyslipidemia associated with type 2 diabetes mellitus (HCC)  - POCT HgB A1C - metFORMIN (GLUCOPHAGE-XR) 750 MG 24 hr  tablet; Take 1 tablet (750 mg total) by mouth daily with breakfast.  Dispense: 90 tablet; Refill: 0 - omega-3 acid ethyl esters (LOVAZA) 1 g capsule; Take 2 capsules (2 g total) by mouth daily.  Dispense: 180 capsule; Refill: 0  2. Need for immunization against influenza  - Flu Vaccine QUAD 36+ mos IM  3. Essential hypertension  - amLODipine-valsartan (EXFORGE) 10-320 MG tablet; Take 1 tablet by mouth daily.  Dispense: 90 tablet; Refill: 1  4. Morbid obesity (HCC)  Discussed with the patient the risk posed by an increased BMI. Discussed importance of portion control, calorie counting and at least 150 minutes of physical activity weekly. Avoid sweet beverages and drink more water. Eat at least 6 servings of fruit and vegetables daily   5. Chronic pain syndrome  Keep follow up with Dr. Cherylann Ratel   6. Constipation due to opioid therapy   7. Erectile dysfunction due to type 2 diabetes mellitus (HCC)   8. Continuous opioid dependence (HCC)  9. BPH associated with nocturia  Stable   10. Dyslipidemia

## 2020-01-02 ENCOUNTER — Encounter: Payer: Self-pay | Admitting: Family Medicine

## 2020-01-02 ENCOUNTER — Ambulatory Visit (INDEPENDENT_AMBULATORY_CARE_PROVIDER_SITE_OTHER): Payer: 59 | Admitting: Family Medicine

## 2020-01-02 ENCOUNTER — Other Ambulatory Visit: Payer: Self-pay

## 2020-01-02 VITALS — BP 140/70 | HR 84 | Temp 98.1°F | Resp 16 | Ht 72.0 in | Wt 269.9 lb

## 2020-01-02 DIAGNOSIS — Z23 Encounter for immunization: Secondary | ICD-10-CM | POA: Diagnosis not present

## 2020-01-02 DIAGNOSIS — E1169 Type 2 diabetes mellitus with other specified complication: Secondary | ICD-10-CM | POA: Diagnosis not present

## 2020-01-02 DIAGNOSIS — N521 Erectile dysfunction due to diseases classified elsewhere: Secondary | ICD-10-CM

## 2020-01-02 DIAGNOSIS — E785 Hyperlipidemia, unspecified: Secondary | ICD-10-CM

## 2020-01-02 DIAGNOSIS — T402X5A Adverse effect of other opioids, initial encounter: Secondary | ICD-10-CM

## 2020-01-02 DIAGNOSIS — R351 Nocturia: Secondary | ICD-10-CM

## 2020-01-02 DIAGNOSIS — I10 Essential (primary) hypertension: Secondary | ICD-10-CM | POA: Diagnosis not present

## 2020-01-02 DIAGNOSIS — G894 Chronic pain syndrome: Secondary | ICD-10-CM

## 2020-01-02 DIAGNOSIS — F1120 Opioid dependence, uncomplicated: Secondary | ICD-10-CM

## 2020-01-02 DIAGNOSIS — K5903 Drug induced constipation: Secondary | ICD-10-CM

## 2020-01-02 DIAGNOSIS — N401 Enlarged prostate with lower urinary tract symptoms: Secondary | ICD-10-CM

## 2020-01-02 LAB — POCT GLYCOSYLATED HEMOGLOBIN (HGB A1C): Hemoglobin A1C: 5.6 % (ref 4.0–5.6)

## 2020-01-02 MED ORDER — AMLODIPINE BESYLATE-VALSARTAN 10-320 MG PO TABS: 1.0000 | ORAL_TABLET | Freq: Every day | ORAL | 1 refills | Status: DC

## 2020-01-02 MED ORDER — OMEGA-3-ACID ETHYL ESTERS 1 G PO CAPS: 2.0000 g | ORAL_CAPSULE | Freq: Every day | ORAL | 0 refills | Status: DC

## 2020-01-02 MED ORDER — METFORMIN HCL ER 750 MG PO TB24: 750.0000 mg | ORAL_TABLET | Freq: Every day | ORAL | 0 refills | Status: DC

## 2020-02-06 ENCOUNTER — Ambulatory Visit
Payer: 59 | Attending: Student in an Organized Health Care Education/Training Program | Admitting: Student in an Organized Health Care Education/Training Program

## 2020-02-06 ENCOUNTER — Encounter: Payer: Self-pay | Admitting: Student in an Organized Health Care Education/Training Program

## 2020-02-06 ENCOUNTER — Other Ambulatory Visit: Payer: Self-pay

## 2020-02-06 VITALS — BP 109/80 | HR 77 | Temp 97.3°F | Resp 18 | Ht 72.0 in | Wt 266.0 lb

## 2020-02-06 DIAGNOSIS — M5136 Other intervertebral disc degeneration, lumbar region: Secondary | ICD-10-CM | POA: Diagnosis not present

## 2020-02-06 DIAGNOSIS — Z981 Arthrodesis status: Secondary | ICD-10-CM | POA: Insufficient documentation

## 2020-02-06 DIAGNOSIS — G894 Chronic pain syndrome: Secondary | ICD-10-CM | POA: Insufficient documentation

## 2020-02-06 DIAGNOSIS — G8929 Other chronic pain: Secondary | ICD-10-CM

## 2020-02-06 DIAGNOSIS — M47816 Spondylosis without myelopathy or radiculopathy, lumbar region: Secondary | ICD-10-CM | POA: Diagnosis present

## 2020-02-06 DIAGNOSIS — Z79891 Long term (current) use of opiate analgesic: Secondary | ICD-10-CM | POA: Insufficient documentation

## 2020-02-06 DIAGNOSIS — M545 Low back pain, unspecified: Secondary | ICD-10-CM | POA: Insufficient documentation

## 2020-02-06 MED ORDER — OXYCONTIN 40 MG PO T12A
40.0000 mg | EXTENDED_RELEASE_TABLET | Freq: Two times a day (BID) | ORAL | 0 refills | Status: DC
Start: 2020-04-14 — End: 2020-05-07

## 2020-02-06 MED ORDER — OXYCODONE-ACETAMINOPHEN 10-325 MG PO TABS: 1.0000 | ORAL_TABLET | Freq: Two times a day (BID) | ORAL | 0 refills | Status: DC | PRN

## 2020-02-06 MED ORDER — OXYCONTIN 40 MG PO T12A
40.0000 mg | EXTENDED_RELEASE_TABLET | Freq: Two times a day (BID) | ORAL | 0 refills | Status: DC
Start: 2020-02-14 — End: 2020-05-07

## 2020-02-06 MED ORDER — OXYCONTIN 40 MG PO T12A
40.0000 mg | EXTENDED_RELEASE_TABLET | Freq: Two times a day (BID) | ORAL | 0 refills | Status: DC
Start: 2020-03-15 — End: 2020-05-07

## 2020-02-06 NOTE — Progress Notes (Signed)
PROVIDER NOTE: Information contained herein reflects review and annotations entered in association with encounter. Interpretation of such information and data should be left to medically-trained personnel. Information provided to patient can be located elsewhere in the medical record under "Patient Instructions". Document created using STT-dictation technology, any transcriptional errors that may result from process are unintentional.    Patient: Lawrence Thornton.  Service Category: E/M  Provider: Gillis Santa, MD  DOB: [birth year]  DOS: 02/06/2020  Specialty: Interventional Pain Management  MRN: [id number]  Setting: Ambulatory outpatient  PCP: Steele Sizer, MD  Type: Established Patient    Referring Provider: Steele Sizer, MD  Location: Office  Delivery: Face-to-face     HPI  [name]., a 64 y.o. year old male, is here today because of his S/P lumbar fusion [Z98.1]. [name] primary complain today is Back Pain (low r ight) Last encounter: My last encounter with him was on 10/31/2019. Pertinent problems: [name] has Chronic right-sided low back pain without sciatica; Continuous opioid dependence ([place]); S/P knee replacement; S/P lumbar fusion; Osteoarthritis of acromioclavicular joint; Long term current use of opiate analgesic; Lumbar spondylosis; Morbid obesity ([place]); and Long term prescription opiate use on their pertinent problem list. Pain Assessment: Severity of Chronic pain is reported as a 3 /10. Location: Back Lower, Right/denies. Onset: More than a month ago. Quality: Larence Penning. Timing: Constant. Modifying factor(s): exercise, medication. Vitals:  height is 6' (1.829 m) and weight is 266 lb (120.7 kg). His temperature is 97.3 F (36.3 C) (abnormal). His blood pressure is 109/80 and his pulse is 77. His respiration is 18 and oxygen saturation is 100%.   Reason for encounter: medication management.   No change in medical history since last visit.   Patient's pain is at baseline.  Patient continues pain regimen as prescribed.  States that it provides pain relief and improvement in functional status. Since his last visit with me, patient's A1c has reduced.  He has been using Linzess.  He states that he has Narcan at home.  Otherwise pain is at baseline and well controlled on current regimen.  Pharmacotherapy Assessment   01/15/2020  10/31/2019   1  Oxycodone-Acetaminophen 10-325 60.00  30  Bi Lat  [id number]  Wal (0252)  0/0  30.00 MME  Comm Ins  [place]    01/15/2020  10/31/2019   1  Oxycontin Er 40 Mg Tablet 60.00  30  Bi Lat  [id number]  Wal (0252)  0/0  120.00 MME  Comm Ins  [place]      Monitoring: [place] PMP: PDMP reviewed during this encounter.       Pharmacotherapy: No side-effects or adverse reactions reported. Compliance: No problems identified. Effectiveness: Clinically acceptable.  Dewayne Shorter, RN  02/06/2020  8:05 AM  Sign when Signing Visit Nursing Pain Medication Assessment:  Safety precautions to be maintained throughout the outpatient stay will include: orient to surroundings, keep bed in low position, maintain call bell within reach at all times, provide assistance with transfer out of bed and ambulation.  Medication Inspection Compliance: Pill count conducted under aseptic conditions, in front of the patient. Neither the pills nor the bottle was removed from the patient's sight at any time. Once count was completed pills were immediately returned to the patient in their original bottle.  Medication: Oxycodone/APAP Pill/Patch Count: 21 of 60 pills remain Pill/Patch Appearance: Markings consistent with prescribed medication Bottle Appearance: Standard pharmacy container. Clearly labeled. Filled Date: 54 / 10 / 2021 Last  Medication intake:  Today   oxycontin 40 mg 21/60 Filled 01-15-2020 today    UDS:  Summary  Date Value Ref Range Status  10/31/2019 Note  Final    Comment:     ==================================================================== [facility] Select 13 (MW) ==================================================================== Test                             Result       Flag       Units  Drug Present and Declared for Prescription Verification   Oxycodone                      4278         EXPECTED   ng/mg creat   Oxymorphone                    4669         EXPECTED   ng/mg creat   Noroxycodone                   4769         EXPECTED   ng/mg creat   Noroxymorphone                 1364         EXPECTED   ng/mg creat    Sources of oxycodone are scheduled prescription medications.    Oxymorphone, noroxycodone, and noroxymorphone are expected    metabolites of oxycodone. Oxymorphone is also available as a    scheduled prescription medication.  ==================================================================== Test                      Result    Flag   Units      Ref Range   Creatinine              135              mg/dL      >=20 ==================================================================== Declared Medications:  The flagging and interpretation on this report are based on the  following declared medications.  Unexpected results may arise from  inaccuracies in the declared medications.   **Note: The testing scope of this panel includes these medications:   Oxycodone (Oxycontin)  Oxycodone (Percocet)   **Note: The testing scope of this panel does not include the  following reported medications:   Acetaminophen (Percocet)  Amlodipine (Exforge)  Aspirin  Atorvastatin (Lipitor)  Linaclotide (Linzess)  Metformin  Naloxone (Narcan)  Omega-3 Fatty Acids  Sildenafil (Viagra)  Tamsulosin (Flomax)  Tizanidine (Zanaflex)  Valsartan (Exforge) ==================================================================== For clinical consultation, please call [phone]. ====================================================================       ROS  Constitutional: Denies any fever or chills Gastrointestinal: No reported hemesis, hematochezia, vomiting, or acute GI distress Musculoskeletal: Denies any acute onset joint swelling, redness, loss of ROM, or weakness Neurological: No reported episodes of acute onset apraxia, aphasia, dysarthria, agnosia, amnesia, paralysis, loss of coordination, or loss of consciousness  Medication Review  amLODipine-valsartan, aspirin EC, atorvastatin, linaclotide, metFORMIN, naloxone, omega-3 acid ethyl esters, oxyCODONE, oxyCODONE-acetaminophen, sildenafil, tamsulosin, and tiZANidine  History Review  Allergy: Mr. Thornton has No Known Allergies. Drug: Mr. Mitchelle  reports no history of drug use. Alcohol:  reports no history of alcohol use. Tobacco:  reports that he quit smoking about 20 years ago. His smoking use included cigarettes. He has a 30.00 pack-year smoking history. He has never used smokeless tobacco. Social:  Mr. Wix  reports that he quit smoking about 20 years ago. His smoking use included cigarettes. He has a 30.00 pack-year smoking history. He has never used smokeless tobacco. He reports that he does not drink alcohol and does not use drugs. Medical:  has a past medical history of Arthritis and Hypertension. Surgical: Mr. Rohleder  has a past surgical history that includes Back surgery (lower back surgery x 3 ); Appendectomy (age 78); sebaceous cyst removed (Right, 15 years ago); Toe Fusion (Right); metal removed from eye (years ago); Total knee arthroplasty (Left, 08/10/2014); Total knee arthroplasty (Right, 12/21/2014); Total knee arthroplasty (Right, 12/21/14); Lumbar fusion (1999); Colonoscopy with propofol (N/A, 05/04/2017); and arthroscopic shoulder surgery (Right, 08/31/2017). Family: family history includes Cancer in his mother; Heart attack in his mother; Hyperlipidemia in his father; Hypertension in his father.  Laboratory Chemistry Profile   Renal Lab Results  Component  Value Date   BUN 11 08/01/2019   CREATININE 0.75 08/01/2019   LABCREA 96 73/42/8768   BCR NOT APPLICABLE 11/57/2620   GFRAA 113 08/01/2019   GFRNONAA 98 08/01/2019     Hepatic Lab Results  Component Value Date   AST 33 08/01/2019   ALT 42 08/01/2019   ALBUMIN 4.6 11/11/2015   ALKPHOS 76 11/11/2015   HCVAB NEGATIVE 10/06/2016     Electrolytes Lab Results  Component Value Date   NA 138 08/01/2019   K 4.6 08/01/2019   CL 101 08/01/2019   CALCIUM 10.2 08/01/2019     Bone Lab Results  Component Value Date   VD25OH 43 10/06/2016     Inflammation (CRP: Acute Phase) (ESR: Chronic Phase) No results found for: CRP, ESRSEDRATE, LATICACIDVEN     Note: Above Lab results reviewed.    Physical Exam  General appearance: Well nourished, well developed, and well hydrated. In no apparent acute distress Mental status: Alert, oriented x 3 (person, place, & time)       Respiratory: No evidence of acute respiratory distress Eyes: PERLA Vitals: BP 109/80   Pulse 77   Temp (!) 97.3 F (36.3 C)   Resp 18   Ht 6' (1.829 m)   Wt 266 lb (120.7 kg)   SpO2 100%   BMI 36.08 kg/m  BMI: Estimated body mass index is 36.08 kg/m as calculated from the following:   Height as of this encounter: 6' (1.829 m).   Weight as of this encounter: 266 lb (120.7 kg). Ideal: Ideal body weight: 77.6 kg (171 lb 1.2 oz) Adjusted ideal body weight: 94.8 kg (209 lb 0.7 oz)   Lumbar Spine Area Exam  Skin & Axial Inspection: Well healed scar from previous spine surgery detected Alignment: Symmetrical Functional ROM: Pain restricted ROM affecting both sides Stability: No instability detected Muscle Tone/Strength: Functionally intact. No obvious neuro-muscular anomalies detected. Sensory (Neurological): Myotome pain pattern  Gait & Posture Assessment  Ambulation: Unassisted Gait: Relatively normal for age and body habitus Posture: WNL  Lower Extremity Exam    Side: Right lower extremity  Side:  Left lower extremity  Stability: No instability observed          Stability: No instability observed          Skin & Extremity Inspection: Skin color, temperature, and hair growth are WNL. No peripheral edema or cyanosis. No masses, redness, swelling, asymmetry, or associated skin lesions. No contractures.  Skin & Extremity Inspection: Skin color, temperature, and hair growth are WNL. No peripheral edema or cyanosis. No masses, redness, swelling, asymmetry, or associated  skin lesions. No contractures.  Functional ROM: Unrestricted ROM                  Functional ROM: Unrestricted ROM                  Muscle Tone/Strength: Functionally intact. No obvious neuro-muscular anomalies detected.  Muscle Tone/Strength: Functionally intact. No obvious neuro-muscular anomalies detected.  Sensory (Neurological): Unimpaired        Sensory (Neurological): Unimpaired        DTR: Patellar: deferred today Achilles: deferred today Plantar: deferred today  DTR: Patellar: deferred today Achilles: deferred today Plantar: deferred today  Palpation: No palpable anomalies  Palpation: No palpable anomalies      Assessment   Status Diagnosis  Controlled Controlled Controlled 1. S/P lumbar fusion   2. Chronic pain syndrome   3. Chronic right-sided low back pain without sciatica   4. Lumbar degenerative disc disease   5. Long term prescription opiate use   6. Chronic use of opiate for therapeutic purpose   7. Lumbar spondylosis       Plan of Care   Mr. Everton Bertha. has a current medication list which includes the following long-term medication(s): amlodipine-valsartan, atorvastatin, linaclotide, metformin, omega-3 acid ethyl esters, [START ON 02/14/2020] oxycodone-acetaminophen, [START ON 03/15/2020] oxycodone-acetaminophen, [START ON 04/14/2020] oxycodone-acetaminophen, [START ON 02/14/2020] oxycontin, [START ON 03/15/2020] oxycontin, [START ON 04/14/2020] oxycontin, and  sildenafil.  Pharmacotherapy (Medications Ordered): Meds ordered this encounter  Medications  . OXYCONTIN 40 MG 12 hr tablet    Sig: Take 1 tablet (40 mg total) by mouth 2 (two) times daily.    Dispense:  60 tablet    Refill:  0    For chronic pain syndrome  . OXYCONTIN 40 MG 12 hr tablet    Sig: Take 1 tablet (40 mg total) by mouth 2 (two) times daily.    Dispense:  60 tablet    Refill:  0    For chronic pain syndrome  . OXYCONTIN 40 MG 12 hr tablet    Sig: Take 1 tablet (40 mg total) by mouth 2 (two) times daily.    Dispense:  60 tablet    Refill:  0    For chronic pain syndrome  . oxyCODONE-acetaminophen (PERCOCET) 10-325 MG tablet    Sig: Take 1 tablet by mouth 2 (two) times daily as needed for pain.    Dispense:  60 tablet    Refill:  0  . oxyCODONE-acetaminophen (PERCOCET) 10-325 MG tablet    Sig: Take 1 tablet by mouth 2 (two) times daily as needed for pain.    Dispense:  60 tablet    Refill:  0  . oxyCODONE-acetaminophen (PERCOCET) 10-325 MG tablet    Sig: Take 1 tablet by mouth 2 (two) times daily as needed for pain.    Dispense:  60 tablet    Refill:  0    Follow-up plan:   Return in about 3 months (around 05/08/2020) for Medication Management, in person.   Recent Visits No visits were found meeting these conditions. Showing recent visits within past 90 days and meeting all other requirements Today's Visits Date Type Provider Dept  02/06/20 Office Visit Gillis Santa, MD [facility]  Showing today's visits and meeting all other requirements Future Appointments No visits were found meeting these conditions. Showing future appointments within next 90 days and meeting all other requirements  I discussed the assessment and treatment plan with the patient. The patient was provided  an opportunity to ask questions and all were answered. The patient agreed with the plan and demonstrated an understanding of the instructions.  Patient advised to call back or  seek an in-person evaluation if the symptoms or condition worsens.  Duration of encounter: 57mnutes.  Note by: BGillis Santa MD Date: 02/06/2020; Time: 8:34 AM

## 2020-02-06 NOTE — Progress Notes (Signed)
Nursing Pain Medication Assessment:  Safety precautions to be maintained throughout the outpatient stay will include: orient to surroundings, keep bed in low position, maintain call bell within reach at all times, provide assistance with transfer out of bed and ambulation.  Medication Inspection Compliance: Pill count conducted under aseptic conditions, in front of the patient. Neither the pills nor the bottle was removed from the patient's sight at any time. Once count was completed pills were immediately returned to the patient in their original bottle.  Medication: Oxycodone/APAP Pill/Patch Count: 21 of 60 pills remain Pill/Patch Appearance: Markings consistent with prescribed medication Bottle Appearance: Standard pharmacy container. Clearly labeled. Filled Date: 10 / 10 / 2021 Last Medication intake:  Today   oxycontin 40 mg 21/60 Filled 01-15-2020 today

## 2020-05-01 NOTE — Progress Notes (Signed)
Name: Lawrence Thornton.   MRN: [id number]    DOB: 1955/09/05   Date:05/03/2020       Progress Note  Subjective  Chief Complaint  Follow up   HPI   DMII:diagnosed May 2019 with a A1C was 6.9% he is on Metformin and A1C has been at goal, it was 5.6 % in July and today is 5.9 %, down to 750 mg per day   He denies polydipsia ( but states likes water), no polyphagia, has nocturia from BPH.. Denies blurred vision, he is going this Summer to have an eye exam.  He has a history of high triglycerides but doing well on  Lovaza2 at night because that is how he can tolerate and also Atorvastatin daily. He has Viagra and takes it prn only.  Chronic pain and opioid use: he was given Linzessand only takes it prn, very expensive .He states bowel movements are now daily, but states needs to take medication occasionally to help him have a bowel movement. He is under the care of NP [facility] and Dr. Cherylann Ratel, pain level now is 3/10right lower back. 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  HTN: no chest pain, palpitation or SOB. No side effects of medication BP is at goal   BBPV: it is usually when he first gets up , gets spinning sensation, nausea, no hearing loss. Episodes are intermittent. He has Epley maneuver at home. Denies hearing loss   Patient Active Problem List   Diagnosis Date Noted  . Long term prescription opiate use 12/15/2018  . Morbid obesity (HCC) 07/28/2018  . Lumbar spondylosis 03/15/2018  . Long term current use of opiate analgesic 12/15/2017  . Dyslipidemia associated with type 2 diabetes mellitus (HCC)  08/13/2017  . Osteoarthritis of acromioclavicular joint 07/30/2017  . S/P lumbar fusion 07/13/2017  . Chronic pain syndrome 07/13/2017  . Personal history of colonic polyps   . Constipation due to opioid therapy 07/18/2015  . Hypertriglyceridemia 01/22/2015  . Extreme obesity 01/22/2015  . S/P knee replacement 12/21/2014  . Chronic right-sided low back pain without sciatica 09/21/2014  . BPH associated with nocturia 09/21/2014  . Dyslipidemia 09/21/2014  . Continuous opioid dependence (HCC) 09/21/2014  . Lumbar degenerative disc disease 09/21/2014  . Failure of erection 09/21/2014    Past Surgical History:  Procedure Laterality Date  . APPENDECTOMY  age 50  . arthroscopic shoulder surgery Right 08/31/2017  . BACK SURGERY  lower back surgery x 3    L 3 L 4 and L 5 are fused together  . COLONOSCOPY WITH PROPOFOL N/A 05/04/2017   Procedure: COLONOSCOPY WITH PROPOFOL;  Surgeon: Midge Minium, MD;  Location: [facility] ENDOSCOPY;  Service: Endoscopy;  Laterality: N/A;  . LUMBAR FUSION  1999   L- to L5  . metal removed from eye  years ago  . sebaceous cyst removed Right 15 years ago   middle  finger   . TOE FUSION Right    4th toe  . 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 TOTAL KNEE ARTHROPLASTY Right 12/21/2014   Procedure: RIGHT TOTAL KNEE ARTHROPLASTY;  Surgeon: Eugenia Mcalpine, MD;  Location: [facility] ORS;  Service: Orthopedics;  Laterality: Right;  . TOTAL KNEE ARTHROPLASTY Right 12/21/14    Family History  Problem Relation Age of Onset  . Cancer Mother        Throat  . Heart attack Mother   . Hypertension Father   . Hyperlipidemia Father     Social History   Tobacco Use  . Smoking status: Former Smoker    Packs/day: 1.50    Years: 20.00    Pack years: 30.00    Types: Cigarettes    Quit date: 04/07/1999    Years since quitting: 21.0  . Smokeless  tobacco: Never Used  . Tobacco comment: not smoking   Substance Use Topics  . Alcohol use: No     Current Outpatient Medications:  .  amLODipine-valsartan (EXFORGE) 10-320 MG tablet, Take 1 tablet by mouth daily., Disp: 90 tablet, Rfl: 1 .  aspirin EC 81 MG tablet, Take 1 tablet (81 mg total) by mouth daily., Disp: 30 tablet, Rfl: 0 .  atorvastatin (LIPITOR) 20 MG tablet, TAKE 1 TABLET(20 MG) BY MOUTH DAILY, Disp: 90 tablet, Rfl: 1 .  linaclotide (LINZESS) 145 MCG CAPS capsule, Take 1 capsule (145 mcg total) by mouth daily before breakfast., Disp: 90 capsule, Rfl: 1 .  metFORMIN (GLUCOPHAGE-XR) 750 MG 24 hr tablet, Take 1 tablet (750 mg total) by mouth daily with breakfast., Disp: 90 tablet, Rfl: 0 .  naloxone (NARCAN) 2 MG/2ML injection, Inject 1 mL (1 mg total) into the muscle as needed for up to 2 doses (for opioid overdose). Inject content of syringe into thigh muscle. Call 911., Disp: 2 mL, Rfl: 1 .  omega-3 acid ethyl esters (LOVAZA) 1 g capsule, Take 2 capsules (2 g total) by mouth daily., Disp: 180 capsule, Rfl: 0 .  oxyCODONE-acetaminophen (PERCOCET) 10-325 MG tablet, Take 1 tablet by mouth 2 (two) times daily as needed for pain., Disp: 60 tablet, Rfl: 0 .  OXYCONTIN 40 MG 12 hr tablet, Take 1 tablet (40 mg total) by mouth 2 (two) times daily., Disp: 60 tablet, Rfl: 0 .  sildenafil (VIAGRA) 100 MG tablet, Take 1 tablet (100 mg total) by mouth daily as needed for erectile dysfunction., Disp: 30 tablet, Rfl: 0 .  tamsulosin (FLOMAX) 0.4 MG CAPS capsule, TAKE 1 CAPSULE(0.4 MG) BY MOUTH DAILY, Disp: 90 capsule, Rfl: 1 .  tiZANidine (ZANAFLEX) 4 MG tablet, Take 1 tablet (4 mg total) by mouth 2 (two) times daily as needed for muscle spasms., Disp: 60 tablet, Rfl: 2 .  oxyCODONE-acetaminophen (PERCOCET) 10-325 MG tablet, Take 1 tablet by mouth 2 (two) times daily as needed for pain., Disp: 60 tablet, Rfl: 0 .  oxyCODONE-acetaminophen (PERCOCET) 10-325 MG tablet, Take 1 tablet by mouth 2 (two)  times daily as needed for pain., Disp: 60 tablet, Rfl: 0 .  OXYCONTIN 40 MG 12 hr tablet, Take 1 tablet (40 mg total) by mouth 2 (two) times daily., Disp: 60 tablet, Rfl: 0 .  OXYCONTIN 40 MG 12 hr tablet, Take 1 tablet (40 mg total) by mouth 2 (two) times daily., Disp: 60 tablet, Rfl: 0  No Known Allergies  I personally reviewed active problem list, medication list, allergies, family history, social history, health maintenance with the patient/caregiver today.   ROS  .Constitutional: Negative for fever , postiive for  weight change.  Respiratory: Negative for cough  and shortness of breath.   Cardiovascular: Negative for chest pain or palpitations.  Gastrointestinal: Negative for abdominal pain, no bowel changes.  Musculoskeletal: Negative for gait problem or joint swelling.  Skin: Negative for rash.  Neurological: Negative for headache. He has episodes of vertigo intermittent  No other specific complaints in a complete review of systems (except as listed in HPI above).  Objective  Vitals:   05/03/20 0800  BP: 122/76  Pulse: 92  Resp: 16  Temp: 98.6 F (37 C)  TempSrc: Oral  SpO2: 99%  Weight: 280 lb 8 oz (127.2 kg)  Height: 6' (1.829 m)    Body mass index is 38.04 kg/m.  Physical Exam  Constitutional: Patient appears well-developed and well-nourished. Obese No distress.  HEENT: head atraumatic, normocephalic, pupils equal and reactive to light,  neck supple Cardiovascular: Normal rate, regular rhythm and normal heart sounds.  No murmur heard. No BLE edema. Pulmonary/Chest: Effort normal and breath sounds normal. No respiratory distress. Abdominal: Soft.  There is no tenderness. Psychiatric: Patient has a normal mood and affect. behavior is normal. Judgment and thought content normal.  Recent Results (from the past 2160 hour(s))  POCT HgB A1C     Status: Abnormal   Collection Time: 05/03/20  8:02 AM  Result Value Ref Range   Hemoglobin A1C 5.9 (A) 4.0 - 5.6 %    HbA1c POC (<> result, manual entry)     HbA1c, POC (prediabetic range)     HbA1c, POC (controlled diabetic range)       PHQ2/9: Depression screen [facility] 2/9 05/03/2020 02/06/2020 01/02/2020 10/31/2019 08/01/2019  Decreased Interest 0 0 0 0 0  Down, Depressed, Hopeless 0 0 0 0 0  PHQ - 2 Score 0 0 0 0 0  Altered sleeping - - - - 0  Tired, decreased energy - - - - 0  Change in appetite - - - - 0  Feeling bad or failure about yourself  - - - - 0  Trouble concentrating - - - - 0  Moving slowly or fidgety/restless - - - - 0  Suicidal thoughts - - - - 0  PHQ-9 Score - - - - 0  Difficult doing work/chores - - - - -  Some recent data might be hidden    phq 9 is negative   Fall Risk: Fall Risk  05/03/2020 02/06/2020 01/02/2020 10/31/2019 08/07/2019  Falls in the past year? 0 0 0 0 0  Number falls in past yr: 0 - 0 0 -  Injury with Fall? 0 - 0 0 -  Follow up - - - - Falls evaluation completed     Functional Status Survey: Is the patient deaf or have difficulty hearing?: Yes Does the patient have difficulty seeing, even when wearing glasses/contacts?: No Does the patient have difficulty concentrating, remembering, or making decisions?: No Does the patient have difficulty walking or climbing stairs?: No Does the patient have difficulty dressing or bathing?: No Does the patient have difficulty doing errands alone such as visiting a doctor's office or shopping?: No    Assessment & Plan  1. Dyslipidemia associated with type 2 diabetes mellitus (HCC)  - POCT HgB A1C - atorvastatin (LIPITOR) 20 MG tablet; Take 1 tablet (20 mg total) by mouth daily.  Dispense: 90 tablet; Refill: 1 - omega-3 acid ethyl esters (LOVAZA) 1 g capsule; Take 2 capsules (2 g total) by mouth daily.  Dispense: 180 capsule; Refill: 1 - Lipid panel - COMPLETE METABOLIC PANEL WITH GFR -  Microalbumin / creatinine urine ratio  2. Essential hypertension  - amLODipine-valsartan (EXFORGE) 10-320 MG tablet; Take 1 tablet by  mouth daily.  Dispense: 90 tablet; Refill: 1 - COMPLETE METABOLIC PANEL WITH GFR - CBC with Differential/Platelet  3. BPH associated with nocturia  - tamsulosin (FLOMAX) 0.4 MG CAPS capsule; Take 1 capsule (0.4 mg total) by mouth daily.  Dispense: 90 capsule; Refill: 1 - PSA  4. Morbid obesity (HCC)  Discussed with the patient the risk posed by an increased BMI. Discussed importance of portion control, calorie counting and at least 150 minutes of physical activity weekly. Avoid sweet beverages and drink more water. Eat at least 6 servings of fruit and vegetables daily   5. Chronic pain syndrome  Under the care of Dr. Cherylann Ratel   6. Continuous opioid dependence (HCC)   7. Erectile dysfunction due to type 2 diabetes mellitus (HCC)   8. Constipation due to opioid therapy

## 2020-05-03 ENCOUNTER — Other Ambulatory Visit: Payer: Self-pay

## 2020-05-03 ENCOUNTER — Ambulatory Visit (INDEPENDENT_AMBULATORY_CARE_PROVIDER_SITE_OTHER): Payer: 59 | Admitting: Family Medicine

## 2020-05-03 ENCOUNTER — Encounter: Payer: Self-pay | Admitting: Family Medicine

## 2020-05-03 VITALS — BP 122/76 | HR 92 | Temp 98.6°F | Resp 16 | Ht 72.0 in | Wt 280.5 lb

## 2020-05-03 DIAGNOSIS — N401 Enlarged prostate with lower urinary tract symptoms: Secondary | ICD-10-CM

## 2020-05-03 DIAGNOSIS — T402X5A Adverse effect of other opioids, initial encounter: Secondary | ICD-10-CM

## 2020-05-03 DIAGNOSIS — E785 Hyperlipidemia, unspecified: Secondary | ICD-10-CM | POA: Diagnosis not present

## 2020-05-03 DIAGNOSIS — F1120 Opioid dependence, uncomplicated: Secondary | ICD-10-CM

## 2020-05-03 DIAGNOSIS — I10 Essential (primary) hypertension: Secondary | ICD-10-CM

## 2020-05-03 DIAGNOSIS — E1169 Type 2 diabetes mellitus with other specified complication: Secondary | ICD-10-CM | POA: Diagnosis not present

## 2020-05-03 DIAGNOSIS — G894 Chronic pain syndrome: Secondary | ICD-10-CM

## 2020-05-03 DIAGNOSIS — K5903 Drug induced constipation: Secondary | ICD-10-CM

## 2020-05-03 DIAGNOSIS — R351 Nocturia: Secondary | ICD-10-CM

## 2020-05-03 DIAGNOSIS — N521 Erectile dysfunction due to diseases classified elsewhere: Secondary | ICD-10-CM

## 2020-05-03 LAB — POCT GLYCOSYLATED HEMOGLOBIN (HGB A1C): Hemoglobin A1C: 5.9 % — AB (ref 4.0–5.6)

## 2020-05-03 MED ORDER — AMLODIPINE BESYLATE-VALSARTAN 10-320 MG PO TABS: 1.0000 | ORAL_TABLET | Freq: Every day | ORAL | 1 refills | Status: DC

## 2020-05-03 MED ORDER — ATORVASTATIN CALCIUM 20 MG PO TABS: 20.0000 mg | ORAL_TABLET | Freq: Every day | ORAL | 1 refills | Status: DC

## 2020-05-03 MED ORDER — TAMSULOSIN HCL 0.4 MG PO CAPS: 0.4000 mg | ORAL_CAPSULE | Freq: Every day | ORAL | 1 refills | Status: DC

## 2020-05-03 MED ORDER — OMEGA-3-ACID ETHYL ESTERS 1 G PO CAPS: 2.0000 g | ORAL_CAPSULE | Freq: Every day | ORAL | 1 refills | Status: DC

## 2020-05-04 LAB — COMPLETE METABOLIC PANEL WITH GFR
AG Ratio: 1.7 (calc) (ref 1.0–2.5)
ALT: 45 U/L (ref 9–46)
AST: 27 U/L (ref 10–35)
Albumin: 4.7 g/dL (ref 3.6–5.1)
Alkaline phosphatase (APISO): 79 U/L (ref 35–144)
BUN: 10 mg/dL (ref 7–25)
CO2: 29 mmol/L (ref 20–32)
Calcium: 10.1 mg/dL (ref 8.6–10.3)
Chloride: 105 mmol/L (ref 98–110)
Creat: 0.83 mg/dL (ref 0.70–1.25)
GFR, Est African American: 108 mL/min/{1.73_m2} (ref >=60)
GFR, Est Non African American: 93 mL/min/{1.73_m2} (ref >=60)
Globulin: 2.8 g/dL (calc) (ref 1.9–3.7)
Glucose, Bld: 117 mg/dL — ABNORMAL HIGH (ref 65–99)
Potassium: 4.9 mmol/L (ref 3.5–5.3)
Sodium: 141 mmol/L (ref 135–146)
Total Bilirubin: 0.4 mg/dL (ref 0.2–1.2)
Total Protein: 7.5 g/dL (ref 6.1–8.1)

## 2020-05-04 LAB — CBC WITH DIFFERENTIAL/PLATELET
Absolute Monocytes: 415 cells/uL (ref 200–950)
Basophils Absolute: 48 cells/uL (ref 0–200)
Basophils Relative: 0.7 %
Eosinophils Absolute: 211 cells/uL (ref 15–500)
Eosinophils Relative: 3.1 %
HCT: 44.9 % (ref 38.5–50.0)
Hemoglobin: 15.6 g/dL (ref 13.2–17.1)
Lymphs Abs: 2264 cells/uL (ref 850–3900)
MCH: 31.1 pg (ref 27.0–33.0)
MCHC: 34.7 g/dL (ref 32.0–36.0)
MCV: 89.4 fL (ref 80.0–100.0)
MPV: 9.3 fL (ref 7.5–12.5)
Monocytes Relative: 6.1 %
Neutro Abs: 3862 cells/uL (ref 1500–7800)
Neutrophils Relative %: 56.8 %
Platelets: 270 10*3/uL (ref 140–400)
RBC: 5.02 10*6/uL (ref 4.20–5.80)
RDW: 12.4 % (ref 11.0–15.0)
Total Lymphocyte: 33.3 %
WBC: 6.8 10*3/uL (ref 3.8–10.8)

## 2020-05-04 LAB — MICROALBUMIN / CREATININE URINE RATIO
Creatinine, Urine: 236 mg/dL (ref 20–320)
Microalb Creat Ratio: 12 mcg/mg creat (ref <30)
Microalb, Ur: 2.8 mg/dL

## 2020-05-04 LAB — LIPID PANEL
Cholesterol: 134 mg/dL (ref <200)
HDL: 52 mg/dL (ref >=40)
LDL Cholesterol (Calc): 66 mg/dL (calc)
Non-HDL Cholesterol (Calc): 82 mg/dL (calc) (ref <130)
Total CHOL/HDL Ratio: 2.6 (calc) (ref <5.0)
Triglycerides: 80 mg/dL (ref <150)

## 2020-05-04 LAB — PSA: PSA: 0.17 ng/mL (ref <=4.0)

## 2020-05-07 ENCOUNTER — Ambulatory Visit
Payer: 59 | Attending: Student in an Organized Health Care Education/Training Program | Admitting: Student in an Organized Health Care Education/Training Program

## 2020-05-07 ENCOUNTER — Encounter: Payer: Self-pay | Admitting: Student in an Organized Health Care Education/Training Program

## 2020-05-07 ENCOUNTER — Other Ambulatory Visit: Payer: Self-pay

## 2020-05-07 VITALS — BP 166/89 | HR 79 | Temp 98.2°F | Resp 18 | Ht 72.0 in | Wt 275.0 lb

## 2020-05-07 DIAGNOSIS — M47816 Spondylosis without myelopathy or radiculopathy, lumbar region: Secondary | ICD-10-CM | POA: Diagnosis present

## 2020-05-07 DIAGNOSIS — M545 Low back pain, unspecified: Secondary | ICD-10-CM

## 2020-05-07 DIAGNOSIS — Z981 Arthrodesis status: Secondary | ICD-10-CM | POA: Diagnosis not present

## 2020-05-07 DIAGNOSIS — M51369 Other intervertebral disc degeneration, lumbar region without mention of lumbar back pain or lower extremity pain: Secondary | ICD-10-CM

## 2020-05-07 DIAGNOSIS — M5136 Other intervertebral disc degeneration, lumbar region: Secondary | ICD-10-CM

## 2020-05-07 DIAGNOSIS — G894 Chronic pain syndrome: Secondary | ICD-10-CM | POA: Diagnosis not present

## 2020-05-07 DIAGNOSIS — G8929 Other chronic pain: Secondary | ICD-10-CM

## 2020-05-07 DIAGNOSIS — Z79891 Long term (current) use of opiate analgesic: Secondary | ICD-10-CM

## 2020-05-07 MED ORDER — OXYCONTIN 40 MG PO T12A: 40.0000 mg | EXTENDED_RELEASE_TABLET | Freq: Two times a day (BID) | ORAL | 0 refills | Status: DC

## 2020-05-07 MED ORDER — OXYCODONE-ACETAMINOPHEN 10-325 MG PO TABS: 1.0000 | ORAL_TABLET | Freq: Two times a day (BID) | ORAL | 0 refills | Status: DC | PRN

## 2020-05-07 NOTE — Progress Notes (Signed)
Nursing Pain Medication Assessment:  Safety precautions to be maintained throughout the outpatient stay will include: orient to surroundings, keep bed in low position, maintain call bell within reach at all times, provide assistance with transfer out of bed and ambulation.  Medication Inspection Compliance: Pill count conducted under aseptic conditions, in front of the patient. Neither the pills nor the bottle was removed from the patient's sight at any time. Once count was completed pills were immediately returned to the patient in their original bottle.  Medication #1: Oxycodone ER (OxyContin) Pill/Patch Count: 21 of 60 pills remain Pill/Patch Appearance: Markings consistent with prescribed medication Bottle Appearance: Standard pharmacy container. Clearly labeled. Filled Date: 1 / 33 / 22 Last Medication intake:  Today  Medication #2: Oxycodone/APAP Pill/Patch Count: 21 of 60 pills remain Pill/Patch Appearance: Markings consistent with prescribed medication Bottle Appearance: Standard pharmacy container. Clearly labeled. Filled Date: 1 / 25 / 22 Last Medication intake:  Today

## 2020-05-07 NOTE — Patient Instructions (Signed)
Oxycontin and oxycodone/APAP to last until 08/14/20 have been escribed to your pharmacy.

## 2020-05-07 NOTE — Progress Notes (Signed)
PROVIDER NOTE: Information contained herein reflects review and annotations entered in association with encounter. Interpretation of such information and data should be left to medically-trained personnel. Information provided to patient can be located elsewhere in the medical record under "Patient Instructions". Document created using STT-dictation technology, any transcriptional errors that may result from process are unintentional.    Patient: Lawrence Thornton.  Service Category: E/M  Provider: Gillis Santa, MD  DOB: [birth year]  DOS: 05/07/2020  Specialty: Interventional Pain Management  MRN: [id number]  Setting: Ambulatory outpatient  PCP: Steele Sizer, MD  Type: Established Patient    Referring Provider: Steele Sizer, MD  Location: Office  Delivery: Face-to-face     HPI  Mr. Ethon Wymer., a 65 y.o. year old male, is here today because of his S/P lumbar fusion [Z98.1]. Mr. Rayl primary complain today is Back Pain (Lower right) Last encounter: My last encounter with him was on 02/06/2020. Pertinent problems: Mr. Geathers has Chronic right-sided low back pain without sciatica; Continuous opioid dependence ([place]); S/P knee replacement; S/P lumbar fusion; Osteoarthritis of acromioclavicular joint; Long term current use of opiate analgesic; Lumbar spondylosis; Morbid obesity ([place]); and Long term prescription opiate use on their pertinent problem list. Pain Assessment: Severity of Chronic pain is reported as a 4 /10. Location: Back Right,Lower/denies. Onset: More than a month ago. Quality: Aching,Sharp,Constant. Timing: Constant. Modifying factor(s): meds. Vitals:  height is 6' (1.829 m) and weight is 275 lb (124.7 kg). His temporal temperature is 98.2 F (36.8 C). His blood pressure is 166/89 (abnormal) and his pulse is 79. His respiration is 18 and oxygen saturation is 99%.   Reason for encounter: medication management.    No change in medical history since last visit.   Patient's pain is at baseline.  Patient continues multimodal pain regimen as prescribed.  States that it provides pain relief and improvement in functional status. Mr. Azzie Roup is planning on retiring soon.  He takes Linzess as needed for constipation.  He states that OxyContin is very expensive for him and as he is planning for retirement, he may need to consider an alternative as he is paying approximately $900 out-of-pocket for his OxyContin 40 mg twice daily.  Pharmacotherapy Assessment   04/16/2020  02/06/2020   1  Oxycontin Er 40 Mg Tablet  60.00  30  Bi Lat  [id number]  Wal (0252)  0/0  120.00 MME  Comm Ins  [place]    04/16/2020  02/06/2020   1  Oxycodone-Acetaminophen 10-325  60.00  30  Bi Lat  [id number]  Wal (0252)  0/0  30.00 MME  Comm Ins  [place]      Analgesic: Oxycontin 40 mg BID (MME 120), Percocet 10 mg BID prn(MME 30)   Monitoring: [place] PMP: PDMP reviewed during this encounter.       Pharmacotherapy: No side-effects or adverse reactions reported. Compliance: No problems identified. Effectiveness: Clinically acceptable.  Rise Patience, RN  05/07/2020  8:21 AM  Sign when Signing Visit Nursing Pain Medication Assessment:  Safety precautions to be maintained throughout the outpatient stay will include: orient to surroundings, keep bed in low position, maintain call bell within reach at all times, provide assistance with transfer out of bed and ambulation.  Medication Inspection Compliance: Pill count conducted under aseptic conditions, in front of the patient. Neither the pills nor the bottle was removed from the patient's sight at any time. Once count was completed pills were immediately returned to the patient in their original bottle.  Medication #1: Oxycodone ER (OxyContin) Pill/Patch Count: 21 of 60 pills remain Pill/Patch Appearance: Markings consistent with prescribed medication Bottle Appearance: Standard pharmacy container. Clearly labeled. Filled Date: 1 / 64 / 22 Last Medication  intake:  Today  Medication #2: Oxycodone/APAP Pill/Patch Count: 21 of 60 pills remain Pill/Patch Appearance: Markings consistent with prescribed medication Bottle Appearance: Standard pharmacy container. Clearly labeled. Filled Date: 1 / 49 / 22 Last Medication intake:  Today    UDS:  Summary  Date Value Ref Range Status  10/31/2019 Note  Final    Comment:    ==================================================================== [employer] Select 13 (MW) ==================================================================== Test                             Result       Flag       Units  Drug Present and Declared for Prescription Verification   Oxycodone                      4278         EXPECTED   ng/mg creat   Oxymorphone                    4669         EXPECTED   ng/mg creat   Noroxycodone                   4769         EXPECTED   ng/mg creat   Noroxymorphone                 1364         EXPECTED   ng/mg creat    Sources of oxycodone are scheduled prescription medications.    Oxymorphone, noroxycodone, and noroxymorphone are expected    metabolites of oxycodone. Oxymorphone is also available as a    scheduled prescription medication.  ==================================================================== Test                      Result    Flag   Units      Ref Range   Creatinine              135              mg/dL      >=20 ==================================================================== Declared Medications:  The flagging and interpretation on this report are based on the  following declared medications.  Unexpected results may arise from  inaccuracies in the declared medications.   **Note: The testing scope of this panel includes these medications:   Oxycodone (Oxycontin)  Oxycodone (Percocet)   **Note: The testing scope of this panel does not include the  following reported medications:   Acetaminophen (Percocet)  Amlodipine (Exforge)  Aspirin  Atorvastatin  (Lipitor)  Linaclotide (Linzess)  Metformin  Naloxone (Narcan)  Omega-3 Fatty Acids  Sildenafil (Viagra)  Tamsulosin (Flomax)  Tizanidine (Zanaflex)  Valsartan (Exforge) ==================================================================== For clinical consultation, please call [phone]. ====================================================================      ROS  Constitutional: Denies any fever or chills Gastrointestinal: No reported hemesis, hematochezia, vomiting, or acute GI distress Musculoskeletal: Low back pain Neurological: No reported episodes of acute onset apraxia, aphasia, dysarthria, agnosia, amnesia, paralysis, loss of coordination, or loss of consciousness  Medication Review  amLODipine-valsartan, aspirin EC, atorvastatin, linaclotide, metFORMIN, naloxone, omega-3 acid ethyl esters, oxyCODONE, oxyCODONE-acetaminophen, sildenafil, tamsulosin, and tiZANidine  History Review  Allergy: Mr.  Mancinas has No Known Allergies. Drug: [name]  reports no history of drug use. Alcohol:  reports no history of alcohol use. Tobacco:  reports that he quit smoking about 21 years ago. His smoking use included cigarettes. He has a 30.00 pack-year smoking history. He has never used smokeless tobacco. Social: Mr. Volkman  reports that he quit smoking about 21 years ago. His smoking use included cigarettes. He has a 30.00 pack-year smoking history. He has never used smokeless tobacco. He reports that he does not drink alcohol and does not use drugs. Medical:  has a past medical history of Arthritis and Hypertension. Surgical: Mr. Goth  has a past surgical history that includes Back surgery (lower back surgery x 3 ); Appendectomy (age 51); sebaceous cyst removed (Right, 15 years ago); Toe Fusion (Right); metal removed from eye (years ago); Total knee arthroplasty (Left, 08/10/2014); Total knee arthroplasty (Right, 12/21/2014); Total knee arthroplasty (Right, 12/21/14); Lumbar  fusion (1999); Colonoscopy with propofol (N/A, 05/04/2017); and arthroscopic shoulder surgery (Right, 08/31/2017). Family: family history includes Cancer in his mother; Heart attack in his mother; Hyperlipidemia in his father; Hypertension in his father.  Laboratory Chemistry Profile   Renal Lab Results  Component Value Date   BUN 10 05/03/2020   CREATININE 0.83 05/03/2020   LABCREA 236 62/83/6629   BCR NOT APPLICABLE 47/65/4650   GFRAA 108 05/03/2020   GFRNONAA 93 05/03/2020     Hepatic Lab Results  Component Value Date   AST 27 05/03/2020   ALT 45 05/03/2020   ALBUMIN 4.6 11/11/2015   ALKPHOS 76 11/11/2015   HCVAB NEGATIVE 10/06/2016     Electrolytes Lab Results  Component Value Date   NA 141 05/03/2020   K 4.9 05/03/2020   CL 105 05/03/2020   CALCIUM 10.1 05/03/2020     Bone Lab Results  Component Value Date   VD25OH 43 10/06/2016     Inflammation (CRP: Acute Phase) (ESR: Chronic Phase) No results found for: CRP, ESRSEDRATE, LATICACIDVEN     Note: Above Lab results reviewed. Note: Reviewed        Physical Exam  General appearance: Well nourished, well developed, and well hydrated. In no apparent acute distress Mental status: Alert, oriented x 3 (person, place, & time)       Respiratory: No evidence of acute respiratory distress Eyes: PERLA Vitals: BP (!) 166/89   Pulse 79   Temp 98.2 F (36.8 C) (Temporal)   Resp 18   Ht 6' (1.829 m)   Wt 275 lb (124.7 kg)   SpO2 99%   BMI 37.30 kg/m  BMI: Estimated body mass index is 37.3 kg/m as calculated from the following:   Height as of this encounter: 6' (1.829 m).   Weight as of this encounter: 275 lb (124.7 kg). Ideal: Ideal body weight: 77.6 kg (171 lb 1.2 oz) Adjusted ideal body weight: 96.5 kg (212 lb 10.3 oz)   Lumbar Spine Area Exam  Skin & Axial Inspection:Well healed scar from previous spine surgery detected Alignment:Symmetrical Functional PTW:SFKC restricted ROMaffecting both  sides Stability:No instability detected Muscle Tone/Strength:Functionally intact. No obvious neuro-muscular anomalies detected. Sensory (Neurological):Myotome pain pattern  Gait & Posture Assessment  Ambulation:Unassisted Gait:Relatively normal for age and body habitus Posture:WNL Lower Extremity Exam    Side:Right lower extremity  Side:Left lower extremity  Stability:No instability observed  Stability:No instability observed  Skin & Extremity Inspection:Skin color, temperature, and hair growth are WNL. No peripheral edema or cyanosis. No masses, redness, swelling, asymmetry, or associated skin  lesions. No contractures.  Skin & Extremity Inspection:Skin color, temperature, and hair growth are WNL. No peripheral edema or cyanosis. No masses, redness, swelling, asymmetry, or associated skin lesions. No contractures.  Functional KZL:[id number] ROM   Functional ZES:[id number] ROM   Muscle Tone/Strength:Functionally intact. No obvious neuro-muscular anomalies detected.  Muscle Tone/Strength:Functionally intact. No obvious neuro-muscular anomalies detected.  Sensory (Neurological):Unimpaired  Sensory (Neurological):Unimpaired  DTR: Patellar:deferred today Achilles:deferred today Plantar:deferred today  DTR: Patellar:deferred today Achilles:deferred today Plantar:deferred today  Palpation:No palpable anomalies  Palpation:No palpable anomalies       Assessment   Status Diagnosis  Controlled Controlled Controlled 1. S/P lumbar fusion   2. Chronic pain syndrome   3. Chronic right-sided low back pain without sciatica   4. Lumbar degenerative disc disease   5. Long term prescription opiate use   6. Chronic use of opiate for therapeutic purpose   7. Lumbar spondylosis       Plan of Care  Mr. Forest Redwine. has a current medication list which includes the following long-term  medication(s): amlodipine-valsartan, atorvastatin, linaclotide, metformin, omega-3 acid ethyl esters, sildenafil, [START ON 05/16/2020] oxycodone-acetaminophen, [START ON 06/15/2020] oxycodone-acetaminophen, [START ON 07/15/2020] oxycodone-acetaminophen, [START ON 05/16/2020] oxycontin, [START ON 06/15/2020] oxycontin, and [START ON 07/15/2020] oxycontin.  Pharmacotherapy (Medications Ordered): Meds ordered this encounter  Medications  . OXYCONTIN 40 MG 12 hr tablet    Sig: Take 1 tablet (40 mg total) by mouth 2 (two) times daily.    Dispense:  60 tablet    Refill:  0    For chronic pain syndrome  . OXYCONTIN 40 MG 12 hr tablet    Sig: Take 1 tablet (40 mg total) by mouth 2 (two) times daily.    Dispense:  60 tablet    Refill:  0    For chronic pain syndrome  . OXYCONTIN 40 MG 12 hr tablet    Sig: Take 1 tablet (40 mg total) by mouth 2 (two) times daily.    Dispense:  60 tablet    Refill:  0    For chronic pain syndrome  . oxyCODONE-acetaminophen (PERCOCET) 10-325 MG tablet    Sig: Take 1 tablet by mouth 2 (two) times daily as needed for pain.    Dispense:  60 tablet    Refill:  0  . oxyCODONE-acetaminophen (PERCOCET) 10-325 MG tablet    Sig: Take 1 tablet by mouth 2 (two) times daily as needed for pain.    Dispense:  60 tablet    Refill:  0  . oxyCODONE-acetaminophen (PERCOCET) 10-325 MG tablet    Sig: Take 1 tablet by mouth 2 (two) times daily as needed for pain.    Dispense:  60 tablet    Refill:  0   Orders:  Orders Placed This Encounter  Procedures  . ToxASSU[employer] Select 13 (MW), Urine    Volume: 30 ml(s). Minimum 3 ml of urine is needed. Document temperature of fresh sample. Indications: Long term (current) use of opiate analgesic [phone])    Order Specific Question:   Release to patient    Answer:   Immediate   Follow-up plan:   Return in about 3 months (around 08/04/2020) for Medication Management, in person.    Recent Visits No visits were found meeting these  conditions. Showing recent visits within past 90 days and meeting all other requirements Today's Visits Date Type Provider Dept  05/07/20 Office Visit Gillis Santa, MD [facility]  Showing today's visits and meeting all other requirements Future Appointments  Date Type Provider Dept  07/30/20 Appointment Gillis Santa, MD [facility]  Showing future appointments within next 90 days and meeting all other requirements  I discussed the assessment and treatment plan with the patient. The patient was provided an opportunity to ask questions and all were answered. The patient agreed with the plan and demonstrated an understanding of the instructions.  Patient advised to call back or seek an in-person evaluation if the symptoms or condition worsens.  Duration of encounter: 30 minutes.  Note by: Gillis Santa, MD Date: 05/07/2020; Time: 9:16 AM

## 2020-05-14 LAB — TOXASSURE SELECT 13 (MW), URINE

## 2020-05-20 ENCOUNTER — Other Ambulatory Visit: Payer: Self-pay | Admitting: Student in an Organized Health Care Education/Training Program

## 2020-05-20 DIAGNOSIS — G894 Chronic pain syndrome: Secondary | ICD-10-CM

## 2020-05-30 ENCOUNTER — Other Ambulatory Visit: Payer: Self-pay | Admitting: Student in an Organized Health Care Education/Training Program

## 2020-05-30 DIAGNOSIS — G894 Chronic pain syndrome: Secondary | ICD-10-CM

## 2020-06-03 ENCOUNTER — Telehealth: Payer: Self-pay

## 2020-06-03 DIAGNOSIS — G894 Chronic pain syndrome: Secondary | ICD-10-CM

## 2020-06-03 MED ORDER — TIZANIDINE HCL 4 MG PO TABS: 4.0000 mg | ORAL_TABLET | Freq: Two times a day (BID) | ORAL | 5 refills | Status: DC | PRN

## 2020-06-03 NOTE — Telephone Encounter (Signed)
Last prescribed 10/2019.

## 2020-06-03 NOTE — Telephone Encounter (Signed)
Pt needs a med refill on Tizanidine 4 MG

## 2020-07-30 ENCOUNTER — Encounter: Payer: Self-pay | Admitting: Student in an Organized Health Care Education/Training Program

## 2020-07-30 ENCOUNTER — Ambulatory Visit
Payer: 59 | Attending: Student in an Organized Health Care Education/Training Program | Admitting: Student in an Organized Health Care Education/Training Program

## 2020-07-30 ENCOUNTER — Other Ambulatory Visit: Payer: Self-pay

## 2020-07-30 VITALS — BP 154/78 | HR 80 | Temp 98.6°F | Resp 16 | Ht 72.0 in | Wt 277.0 lb

## 2020-07-30 DIAGNOSIS — M5136 Other intervertebral disc degeneration, lumbar region: Secondary | ICD-10-CM | POA: Diagnosis present

## 2020-07-30 DIAGNOSIS — Z79891 Long term (current) use of opiate analgesic: Secondary | ICD-10-CM

## 2020-07-30 DIAGNOSIS — M545 Low back pain, unspecified: Secondary | ICD-10-CM | POA: Diagnosis present

## 2020-07-30 DIAGNOSIS — Z981 Arthrodesis status: Secondary | ICD-10-CM

## 2020-07-30 DIAGNOSIS — G8929 Other chronic pain: Secondary | ICD-10-CM

## 2020-07-30 DIAGNOSIS — G894 Chronic pain syndrome: Secondary | ICD-10-CM | POA: Diagnosis not present

## 2020-07-30 MED ORDER — OXYCODONE-ACETAMINOPHEN 10-325 MG PO TABS: 1.0000 | ORAL_TABLET | Freq: Two times a day (BID) | ORAL | 0 refills | Status: DC | PRN

## 2020-07-30 MED ORDER — OXYCONTIN 40 MG PO T12A: 40.0000 mg | EXTENDED_RELEASE_TABLET | Freq: Two times a day (BID) | ORAL | 0 refills | Status: DC

## 2020-07-30 NOTE — Progress Notes (Signed)
PROVIDER NOTE: Information contained herein reflects review and annotations entered in association with encounter. Interpretation of such information and data should be left to medically-trained personnel. Information provided to patient can be located elsewhere in the medical record under "Patient Instructions". Document created using STT-dictation technology, any transcriptional errors that may result from process are unintentional.    Patient: Lawrence Thornton.  Service Category: E/M  Provider: Gillis Santa, MD  DOB: 1955/12/04  DOS: 07/30/2020  Specialty: Interventional Pain Management  MRN: [id number]  Setting: Ambulatory outpatient  PCP: Steele Sizer, MD  Type: Established Patient    Referring Provider: Steele Sizer, MD  Location: Office  Delivery: Face-to-face     HPI  [name]., a 65 y.o. year old male, is here today because of his Chronic pain syndrome [G89.4]. Lawrence Thornton primary complain today is Back Pain (Lower, right) Last encounter: My last encounter with him was on 05/30/2020. Pertinent problems: Lawrence Thornton has Chronic right-sided low back pain without sciatica; Continuous opioid dependence ([place]); S/P knee replacement; S/P lumbar fusion; Osteoarthritis of acromioclavicular joint; Long term current use of opiate analgesic; Lumbar spondylosis; Morbid obesity ([place]); and Chronic use of opiate for therapeutic purpose on their pertinent problem list. Pain Assessment: Severity of Chronic pain is reported as a 3 /10. Location: Back Right,Lower/denies. Onset: More than a month ago. Quality: Constant,Dull. Timing: Constant. Modifying factor(s): meds. Vitals:  height is 6' (1.829 m) and weight is 277 lb (125.6 kg). His temporal temperature is 98.6 F (37 C). His blood pressure is 154/78 (abnormal) and his pulse is 80. His respiration is 16 and oxygen saturation is 97%.   Reason for encounter: medication management.    No change in medical history since last  visit.  Patient's pain is at baseline.  Patient continues multimodal pain regimen as prescribed.  States that it provides pain relief and improvement in functional status. Patient has been renovating his properties and has been traveling to [place] on certain days to help fix a home that he owns. He is trying to lose weight.  Wishes that he could exercise more but feels that he burns enough calories working on his homes.  Pharmacotherapy Assessment   07/15/2020  05/07/2020   1  Oxycodone-Acetaminophen 10-325  60.00  30  Bi Lat  [id number]  [facility] (0252)  0/0  30.00 MME  Comm Ins  [place]    07/15/2020  05/07/2020   1  Oxycontin Er 40 Mg Tablet  60.00  30  Bi Lat  [id number]  [facility] (0252)  0/0  120.00 MME  Comm Ins  [place]      Analgesic: Oxycontin 40 mg BID (MME 120), Percocet 10 mg BID prn(MME 30)   Monitoring:  PMP: PDMP reviewed during this encounter.       Pharmacotherapy: No side-effects or adverse reactions reported. Compliance: No problems identified. Effectiveness: Clinically acceptable.  Rise Patience, RN  07/30/2020  8:08 AM  Sign when Signing Visit Nursing Pain Medication Assessment:  Safety precautions to be maintained throughout the outpatient stay will include: orient to surroundings, keep bed in low position, maintain call bell within reach at all times, provide assistance with transfer out of bed and ambulation.  Medication Inspection Compliance: Pill count conducted under aseptic conditions, in front of the patient. Neither the pills nor the bottle was removed from the patient's sight at any time. Once count was completed pills were immediately returned to the patient in their original bottle.  Medication #1: Oxycodone  ER (OxyContin) Pill/Patch Count: 30 of 60 pills remain Pill/Patch Appearance: Markings consistent with prescribed medication Bottle Appearance: Standard pharmacy container. Clearly labeled. Filled Date: 04 / 11 / 2022 Last Medication intake:  Today  Medication #2:  Oxycodone/APAP Pill/Patch Count: 30 of 60 pills remain Pill/Patch Appearance: Markings consistent with prescribed medication Bottle Appearance: Standard pharmacy container. Clearly labeled. Filled Date: 04 / 11 / 2022 Last Medication intake:  Today    UDS:  Summary  Date Value Ref Range Status  05/07/2020 Note  Final    Comment:    ==================================================================== [facility] (MW) ==================================================================== Test                             Result       Flag       Units  Drug Present and Declared for Prescription Verification   Oxycodone                      3071         EXPECTED   ng/mg creat   Oxymorphone                    1724         EXPECTED   ng/mg creat   Noroxycodone                   3771         EXPECTED   ng/mg creat   Noroxymorphone                 827          EXPECTED   ng/mg creat    Sources of oxycodone are scheduled prescription medications.    Oxymorphone, noroxycodone, and noroxymorphone are expected    metabolites of oxycodone. Oxymorphone is also available as a    scheduled prescription medication.  ==================================================================== Test                      Result    Flag   Units      Ref Range   Creatinine              41               mg/dL      >=20 ==================================================================== Declared Medications:  The flagging and interpretation on this report are based on the  following declared medications.  Unexpected results may arise from  inaccuracies in the declared medications.   **Note: The testing scope of this panel includes these medications:   Oxycodone (Oxycontin)  Oxycodone (Percocet)   **Note: The testing scope of this panel does not include the  following reported medications:   Acetaminophen (Percocet)  Amlodipine (Exforge)  Aspirin  Atorvastatin (Lipitor)  Linaclotide (Linzess)   Metformin (Glucophage)  Naloxone (Narcan)  Omega-3 Fatty Acids  Sildenafil (Viagra)  Tamsulosin (Flomax)  Tizanidine (Zanaflex)  Valsartan (Exforge) ==================================================================== For clinical consultation, please call [phone]. ====================================================================      ROS  Constitutional: Denies any fever or chills Gastrointestinal: No reported hemesis, hematochezia, vomiting, or acute GI distress Musculoskeletal: Denies any acute onset joint swelling, redness, loss of ROM, or weakness Neurological: No reported episodes of acute onset apraxia, aphasia, dysarthria, agnosia, amnesia, paralysis, loss of coordination, or loss of consciousness  Medication Review  amLODipine-valsartan, aspirin EC, atorvastatin, linaclotide, metFORMIN, naloxone, omega-3 acid ethyl esters, oxyCODONE, oxyCODONE-acetaminophen, sildenafil,  tamsulosin, and tiZANidine  History Review  Allergy: Lawrence Thornton has No Known Allergies. Drug: Lawrence Thornton  reports no history of drug use. Alcohol:  reports no history of alcohol use. Tobacco:  reports that he quit smoking about 21 years ago. His smoking use included cigarettes. He has a 30.00 pack-year smoking history. He has never used smokeless tobacco. Social: Lawrence Thornton  reports that he quit smoking about 21 years ago. His smoking use included cigarettes. He has a 30.00 pack-year smoking history. He has never used smokeless tobacco. He reports that he does not drink alcohol and does not use drugs. Medical:  has a past medical history of Arthritis and Hypertension. Surgical: Lawrence Thornton  has a past surgical history that includes Back surgery (lower back surgery x 3 ); Appendectomy (age 88); sebaceous cyst removed (Right, 15 years ago); Toe Fusion (Right); metal removed from eye (years ago); Total knee arthroplasty (Left, 08/10/2014); Total knee arthroplasty (Right, 12/21/2014); Total knee  arthroplasty (Right, 12/21/14); Lumbar fusion (1999); Colonoscopy with propofol (N/A, 05/04/2017); and arthroscopic shoulder surgery (Right, 08/31/2017). Family: family history includes Cancer in his mother; Heart attack in his mother; Hyperlipidemia in his father; Hypertension in his father.  Laboratory Chemistry Profile   Renal Lab Results  Component Value Date   BUN 10 05/03/2020   CREATININE 0.83 05/03/2020   LABCREA 236 73/22/0254   BCR NOT APPLICABLE 27/09/2374   GFRAA 108 05/03/2020   GFRNONAA 93 05/03/2020     Hepatic Lab Results  Component Value Date   AST 27 05/03/2020   ALT 45 05/03/2020   ALBUMIN 4.6 11/11/2015   ALKPHOS 76 11/11/2015   HCVAB NEGATIVE 10/06/2016     Electrolytes Lab Results  Component Value Date   NA 141 05/03/2020   K 4.9 05/03/2020   CL 105 05/03/2020   CALCIUM 10.1 05/03/2020     Bone Lab Results  Component Value Date   VD25OH 43 10/06/2016     Inflammation (CRP: Acute Phase) (ESR: Chronic Phase) No results found for: CRP, ESRSEDRATE, LATICACIDVEN     Note: Above Lab results reviewed.   Physical Exam  General appearance: Well nourished, well developed, and well hydrated. In no apparent acute distress Mental status: Alert, oriented x 3 (person, place, & time)       Respiratory: No evidence of acute respiratory distress Eyes: PERLA Vitals: BP (!) 154/78   Pulse 80   Temp 98.6 F (37 C) (Temporal)   Resp 16   Ht 6' (1.829 m)   Wt 277 lb (125.6 kg)   SpO2 97%   BMI 37.57 kg/m  BMI: Estimated body mass index is 37.57 kg/m as calculated from the following:   Height as of this encounter: 6' (1.829 m).   Weight as of this encounter: 277 lb (125.6 kg). Ideal: Ideal body weight: 77.6 kg (171 lb 1.2 oz) Adjusted ideal body weight: 96.8 kg (213 lb 7.1 oz)    Lumbar Spine Area Exam  Skin & Axial Inspection:Well healed scar from previous spine surgery detected Alignment:Symmetrical Functional EGB:TDVV restricted ROMaffecting  both sides Stability:No instability detected Muscle Tone/Strength:Functionally intact. No obvious neuro-muscular anomalies detected. Sensory (Neurological):Myotome pain pattern  Gait & Posture Assessment  Ambulation:Unassisted Gait:Relatively normal for age and body habitus Posture:WNL Lower Extremity Exam    Side:Right lower extremity  Side:Left lower extremity  Stability:No instability observed  Stability:No instability observed  Skin & Extremity Inspection:Skin color, temperature, and hair growth are WNL. No peripheral edema or cyanosis. No masses, redness, swelling, asymmetry,  or associated skin lesions. No contractures.  Skin & Extremity Inspection:Skin color, temperature, and hair growth are WNL. No peripheral edema or cyanosis. No masses, redness, swelling, asymmetry, or associated skin lesions. No contractures.  Functional PXT:GGYIRSWNIOEV ROM   Functional OJJ:[id number] ROM   Muscle Tone/Strength:Functionally intact. No obvious neuro-muscular anomalies detected.  Muscle Tone/Strength:Functionally intact. No obvious neuro-muscular anomalies detected.  Sensory (Neurological):Unimpaired  Sensory (Neurological):Unimpaired  DTR: Patellar:deferred today Achilles:deferred today Plantar:deferred today  DTR: Patellar:deferred today Achilles:deferred today Plantar:deferred today  Palpation:No palpable anomalies  Palpation:No palpable anomalies    Assessment   Status Diagnosis  Controlled Controlled Controlled 1. Chronic pain syndrome   2. S/P lumbar fusion   3. Chronic right-sided low back pain without sciatica   4. Lumbar degenerative disc disease   5. Long term prescription opiate use   6. Chronic use of opiate for therapeutic purpose   7. Encounter for long-term opiate analgesic use      Updated Problems: Problem  Chronic Use of Opiate for Therapeutic Purpose    Plan of  Care   Lawrence Thornton. has a current medication list which includes the following long-term medication(s): amlodipine-valsartan, atorvastatin, linaclotide, metformin, omega-3 acid ethyl esters, sildenafil, [START ON 08/13/2020] oxycodone-acetaminophen, [START ON 09/12/2020] oxycodone-acetaminophen, [START ON 10/12/2020] oxycodone-acetaminophen, [START ON 08/13/2020] oxycontin, [START ON 09/12/2020] oxycontin, and [START ON 10/12/2020] oxycontin.  Pharmacotherapy (Medications Ordered): Meds ordered this encounter  Medications  . oxyCODONE-acetaminophen (PERCOCET) 10-325 MG tablet    Sig: Take 1 tablet by mouth 2 (two) times daily as needed for pain.    Dispense:  60 tablet    Refill:  0  . OXYCONTIN 40 MG 12 hr tablet    Sig: Take 1 tablet (40 mg total) by mouth 2 (two) times daily.    Dispense:  60 tablet    Refill:  0    For chronic pain syndrome  . OXYCONTIN 40 MG 12 hr tablet    Sig: Take 1 tablet (40 mg total) by mouth 2 (two) times daily.    Dispense:  60 tablet    Refill:  0    For chronic pain syndrome  . OXYCONTIN 40 MG 12 hr tablet    Sig: Take 1 tablet (40 mg total) by mouth 2 (two) times daily.    Dispense:  60 tablet    Refill:  0    For chronic pain syndrome  . oxyCODONE-acetaminophen (PERCOCET) 10-325 MG tablet    Sig: Take 1 tablet by mouth 2 (two) times daily as needed for pain.    Dispense:  60 tablet    Refill:  0  . oxyCODONE-acetaminophen (PERCOCET) 10-325 MG tablet    Sig: Take 1 tablet by mouth 2 (two) times daily as needed for pain.    Dispense:  60 tablet    Refill:  0   -Continue tizanidine as needed -Patient has Narcan at home -UDS up-to-date and appropriate. -Future interventional options could include spinal cord stimulator trial/implant.  Follow-up plan:   Return in about 3 months (around 10/29/2020) for Medication Management, in person.   Recent Visits Date Type Provider Dept  05/07/20 Office Visit Gillis Santa, MD [facility]   Showing recent visits within past 90 days and meeting all other requirements Today's Visits Date Type Provider Dept  07/30/20 Office Visit Gillis Santa, MD [facility]  Showing today's visits and meeting all other requirements Future Appointments No visits were found meeting these conditions. Showing future appointments within next 90 days  and meeting all other requirements  I discussed the assessment and treatment plan with the patient. The patient was provided an opportunity to ask questions and all were answered. The patient agreed with the plan and demonstrated an understanding of the instructions.  Patient advised to call back or seek an in-person evaluation if the symptoms or condition worsens.  Duration of encounter: 35mnutes.  Note by: BGillis Santa MD Date: 07/30/2020; Time: 8:36 AM

## 2020-07-30 NOTE — Progress Notes (Signed)
Nursing Pain Medication Assessment:  Safety precautions to be maintained throughout the outpatient stay will include: orient to surroundings, keep bed in low position, maintain call bell within reach at all times, provide assistance with transfer out of bed and ambulation.  Medication Inspection Compliance: Pill count conducted under aseptic conditions, in front of the patient. Neither the pills nor the bottle was removed from the patient's sight at any time. Once count was completed pills were immediately returned to the patient in their original bottle.  Medication #1: Oxycodone ER (OxyContin) Pill/Patch Count: 30 of 60 pills remain Pill/Patch Appearance: Markings consistent with prescribed medication Bottle Appearance: Standard pharmacy container. Clearly labeled. Filled Date: 04 / 11 / 2022 Last Medication intake:  Today  Medication #2: Oxycodone/APAP Pill/Patch Count: 30 of 60 pills remain Pill/Patch Appearance: Markings consistent with prescribed medication Bottle Appearance: Standard pharmacy container. Clearly labeled. Filled Date: 04 / 11 / 2022 Last Medication intake:  Today

## 2020-07-30 NOTE — Patient Instructions (Signed)
Scripts for oxycodone and oxycontin to last until 11/11/2020 have been escribed to your pharmacy.

## 2020-10-02 NOTE — Patient Instructions (Signed)
Preventive Care 40-64 Years Old, Male Preventive care refers to lifestyle choices and visits with your health care provider that can promote health and wellness. This includes: A yearly physical exam. This is also called an annual wellness visit. Regular dental and eye exams. Immunizations. Screening for certain conditions. Healthy lifestyle choices, such as: Eating a healthy diet. Getting regular exercise. Not using drugs or products that contain nicotine and tobacco. Limiting alcohol use. What can I expect for my preventive care visit? Physical exam Your health care provider will check your: Height and weight. These may be used to calculate your BMI (body mass index). BMI is a measurement that tells if you are at a healthy weight. Heart rate and blood pressure. Body temperature. Skin for abnormal spots. Counseling Your health care provider may ask you questions about your: Past medical problems. Family's medical history. Alcohol, tobacco, and drug use. Emotional well-being. Home life and relationship well-being. Sexual activity. Diet, exercise, and sleep habits. Work and work environment. Access to firearms. What immunizations do I need?  Vaccines are usually given at various ages, according to a schedule. Your health care provider will recommend vaccines for you based on your age, medicalhistory, and lifestyle or other factors, such as travel or where you work. What tests do I need? Blood tests Lipid and cholesterol levels. These may be checked every 5 years, or more often if you are over 50 years old. Hepatitis C test. Hepatitis B test. Screening Lung cancer screening. You may have this screening every year starting at age 55 if you have a 30-pack-year history of smoking and currently smoke or have quit within the past 15 years. Prostate cancer screening. Recommendations will vary depending on your family history and other risks. Genital exam to check for testicular cancer  or hernias. Colorectal cancer screening. All adults should have this screening starting at age 50 and continuing until age 75. Your health care provider may recommend screening at age 45 if you are at increased risk. You will have tests every 1-10 years, depending on your results and the type of screening test. Diabetes screening. This is done by checking your blood sugar (glucose) after you have not eaten for a while (fasting). You may have this done every 1-3 years. STD (sexually transmitted disease) testing, if you are at risk. Follow these instructions at home: Eating and drinking  Eat a diet that includes fresh fruits and vegetables, whole grains, lean protein, and low-fat dairy products. Take vitamin and mineral supplements as recommended by your health care provider. Do not drink alcohol if your health care provider tells you not to drink. If you drink alcohol: Limit how much you have to 0-2 drinks a day. Be aware of how much alcohol is in your drink. In the [place] one drink equals one 12 oz bottle of beer (355 mL), one 5 oz glass of wine (148 mL), or one 1 oz glass of hard liquor (44 mL).  Lifestyle Take daily care of your teeth and gums. Brush your teeth every morning and night with fluoride toothpaste. Floss one time each day. Stay active. Exercise for at least 30 minutes 5 or more days each week. Do not use any products that contain nicotine or tobacco, such as cigarettes, e-cigarettes, and chewing tobacco. If you need help quitting, ask your health care provider. Do not use drugs. If you are sexually active, practice safe sex. Use a condom or other form of protection to prevent STIs (sexually transmitted infections). If told by   your health care provider, take low-dose aspirin daily starting at age 50. Find healthy ways to cope with stress, such as: Meditation, yoga, or listening to music. Journaling. Talking to a trusted person. Spending time with friends and  family. Safety Always wear your seat belt while driving or riding in a vehicle. Do not drive: If you have been drinking alcohol. Do not ride with someone who has been drinking. When you are tired or distracted. While texting. Wear a helmet and other protective equipment during sports activities. If you have firearms in your house, make sure you follow all gun safety procedures. What's next? Go to your health care provider once a year for an annual wellness visit. Ask your health care provider how often you should have your eyes and teeth checked. Stay up to date on all vaccines. This information is not intended to replace advice given to you by your health care provider. Make sure you discuss any questions you have with your healthcare provider. Document Revised: 12/20/2018 Document Reviewed: 03/17/2018 Elsevier Patient Education  2022 [employer]  

## 2020-10-02 NOTE — Progress Notes (Signed)
Name: Lawrence Thornton.   MRN: [id number]    DOB: [birth year]   Date:10/03/2020       Progress Note  Subjective  Chief Complaint  Annual Exam  HPI  Patient presents for annual CPE .  IPSS Questionnaire (AUA-7): Over the past month.   1)  How often have you had a sensation of not emptying your bladder completely after you finish urinating?  0 - Not at all  2)  How often have you had to urinate again less than two hours after you finished urinating? 0 - Not at all  3)  How often have you found you stopped and started again several times when you urinated?  0 - Not at all  4) How difficult have you found it to postpone urination?  0 - Not at all  5) How often have you had a weak urinary stream?  0 - Not at all  6) How often have you had to push or strain to begin urination?  0 - Not at all  7) How many times did you most typically get up to urinate from the time you went to bed until the time you got up in the morning?  2 - 2 times  Total score:  0-7 mildly symptomatic   8-19 moderately symptomatic   20-35 severely symptomatic     Diet: balanced diet  Exercise: continue regular physical activity   Depression: phq 9 is negative Depression screen [facility] 2/9 10/03/2020 05/03/2020 02/06/2020 01/02/2020 10/31/2019  Decreased Interest 0 0 0 0 0  Down, Depressed, Hopeless 0 0 0 0 0  PHQ - 2 Score 0 0 0 0 0  Altered sleeping - - - - -  Tired, decreased energy - - - - -  Change in appetite - - - - -  Feeling bad or failure about yourself  - - - - -  Trouble concentrating - - - - -  Moving slowly or fidgety/restless - - - - -  Suicidal thoughts - - - - -  PHQ-9 Score - - - - -  Difficult doing work/chores - - - - -  Some recent data might be hidden    Hypertension:  BP Readings from Last 3 Encounters:  10/03/20 140/86  07/30/20 (!) 154/78  05/07/20 (!) 166/89    Obesity: Wt Readings from Last 3 Encounters:  10/03/20 280 lb (127 kg)  07/30/20 277 lb (125.6 kg)  05/07/20 275 lb  (124.7 kg)   BMI Readings from Last 3 Encounters:  10/03/20 37.97 kg/m  07/30/20 37.57 kg/m  05/07/20 37.30 kg/m     Lipids:  Lab Results  Component Value Date   CHOL 134 05/03/2020   CHOL 131 08/01/2019   CHOL 131 07/28/2018   Lab Results  Component Value Date   HDL 52 05/03/2020   HDL 47 08/01/2019   HDL 48 07/28/2018   Lab Results  Component Value Date   LDLCALC 66 05/03/2020   LDLCALC 63 08/01/2019   LDLCALC 61 07/28/2018   Lab Results  Component Value Date   TRIG 80 05/03/2020   TRIG 129 08/01/2019   TRIG 141 07/28/2018   Lab Results  Component Value Date   CHOLHDL 2.6 05/03/2020   CHOLHDL 2.8 08/01/2019   CHOLHDL 2.7 07/28/2018   No results found for: LDLDIRECT Glucose:  Glucose, Bld  Date Value Ref Range Status  05/03/2020 117 (H) 65 - 99 mg/dL Final    Comment:    .  Fasting reference interval . For someone without known diabetes, a glucose value between 100 and 125 mg/dL is consistent with prediabetes and should be confirmed with a follow-up test. .   08/01/2019 139 (H) 65 - 99 mg/dL Final    Comment:    .            Fasting reference interval . For someone without known diabetes, a glucose value >125 mg/dL indicates that they may have diabetes and this should be confirmed with a follow-up test. .   01/31/2019 109 (H) 65 - 99 mg/dL Final    Comment:    .            Fasting reference interval . For someone without known diabetes, a glucose value between 100 and 125 mg/dL is consistent with prediabetes and should be confirmed with a follow-up test. .     Flowsheet Row Office Visit from 10/03/2020 in [facility]  AUDIT-C Score 0       Married STD testing and prevention (HIV/chl/gon/syphilis): not interested  Hep C: 10/06/16  Skin cancer: under the care of Dr. Adolphus Birchwood  Colorectal cancer: 05/04/17  Prostate cancer:  Lab Results  Component Value Date   PSA 0.17 05/03/2020   PSA 0.2 01/31/2019    PSA 0.7 10/06/2016     Lung cancer:  Low Dose CT Chest recommended if Age 79-80 years, 30 pack-year currently smoking OR have quit w/in 15years. Patient does not qualify.   AAA: The USPSTF recommends one-time screening with ultrasonography in men ages 22 to 39 years who have ever smoked ECG:  10/10/15  Vaccines:   Pneumonia: educated and discussed with patient. Flu: educated and discussed with patient.  Advanced Care Planning: A voluntary discussion about advance care planning including the explanation and discussion of advance directives.  Discussed health care proxy and Living will, and the patient was able to identify a health care proxy as wife   Patient Active Problem List   Diagnosis Date Noted   Chronic use of opiate for therapeutic purpose 12/15/2018   Morbid obesity (HCC) 07/28/2018   Lumbar spondylosis 03/15/2018   Long term current use of opiate analgesic 12/15/2017   Dyslipidemia associated with type 2 diabetes mellitus (HCC) 08/13/2017   Osteoarthritis of acromioclavicular joint 07/30/2017   S/P lumbar fusion 07/13/2017   Chronic pain syndrome 07/13/2017   Personal history of colonic polyps    Constipation due to opioid therapy 07/18/2015   Hypertriglyceridemia 01/22/2015   Extreme obesity 01/22/2015   S/P knee replacement 12/21/2014   Chronic right-sided low back pain without sciatica 09/21/2014   BPH associated with nocturia 09/21/2014   Dyslipidemia 09/21/2014   Continuous opioid dependence (HCC) 09/21/2014   Lumbar degenerative disc disease 09/21/2014   Failure of erection 09/21/2014    Past Surgical History:  Procedure Laterality Date   APPENDECTOMY  age 9   arthroscopic shoulder surgery Right 08/31/2017   BACK SURGERY  lower back surgery x 3    L 3 L 4 and L 5 are fused together   COLONOSCOPY WITH PROPOFOL N/A 05/04/2017   Procedure: COLONOSCOPY WITH PROPOFOL;  Surgeon: Midge Minium, MD;  Location: [facility] ENDOSCOPY;  Service: Endoscopy;  Laterality: N/A;    LUMBAR FUSION  1999   L- to L5   metal removed from eye  years ago   sebaceous cyst removed Right 15 years ago   middle  finger    TOE FUSION Right    4th toe   TOTAL  KNEE ARTHROPLASTY Left 08/10/2014   Procedure: LEFT TOTAL KNEE ARTHROPLASTY;  Surgeon: Eugenia Mcalpine, MD;  Location: [facility] ORS;  Service: Orthopedics;  Laterality: Left;   TOTAL KNEE ARTHROPLASTY Right 12/21/2014   Procedure: RIGHT TOTAL KNEE ARTHROPLASTY;  Surgeon: Eugenia Mcalpine, MD;  Location: [facility] ORS;  Service: Orthopedics;  Laterality: Right;   TOTAL KNEE ARTHROPLASTY Right 12/21/14    Family History  Problem Relation Age of Onset   Cancer Mother        Throat   Heart attack Mother    Hypertension Father    Hyperlipidemia Father     Social History   Socioeconomic History   Marital status: Married    Spouse name: Darl Pikes   Number of children: 0   Years of education: Not on file   Highest education level: Not on file  Occupational History   Occupation: mill write work   Tobacco Use   Smoking status: Former    Packs/day: 1.50    Years: 20.00    Pack years: 30.00    Types: Cigarettes    Quit date: 04/07/1999    Years since quitting: 21.5   Smokeless tobacco: Never   Tobacco comments:    not smoking   Vaping Use   Vaping Use: Never used  Substance and Sexual Activity   Alcohol use: No   Drug use: No   Sexual activity: Yes    Birth control/protection: Condom  Other Topics Concern   Not on file  Social History Narrative   Not on file   Social Determinants of Health   Financial Resource Strain: Low Risk    Difficulty of Paying Living Expenses: Not hard at all  Food Insecurity: No Food Insecurity   Worried About Programme researcher, broadcasting/film/video in the Last Year: Never true   Ran Out of Food in the Last Year: Never true  Transportation Needs: No Transportation Needs   Lack of Transportation (Medical): No   Lack of Transportation (Non-Medical): No  Physical Activity: Sufficiently Active   Days of Exercise per  Week: 7 days   Minutes of Exercise per Session: 30 min  Stress: No Stress Concern Present   Feeling of Stress : Not at all  Social Connections: Socially Isolated   Frequency of Communication with Friends and Family: Once a week   Frequency of Social Gatherings with Friends and Family: Once a week   Attends Religious Services: Never   Database administrator or Organizations: No   Attends Engineer, structural: Never   Marital Status: Married  Catering manager Violence: Not At Risk   Fear of Current or Ex-Partner: No   Emotionally Abused: No   Physically Abused: No   Sexually Abused: No     Current Outpatient Medications:    amLODipine-valsartan (EXFORGE) 10-320 MG tablet, Take 1 tablet by mouth daily., Disp: 90 tablet, Rfl: 1   aspirin EC 81 MG tablet, Take 1 tablet (81 mg total) by mouth daily., Disp: 30 tablet, Rfl: 0   atorvastatin (LIPITOR) 20 MG tablet, Take 1 tablet (20 mg total) by mouth daily., Disp: 90 tablet, Rfl: 1   linaclotide (LINZESS) 145 MCG CAPS capsule, Take 1 capsule (145 mcg total) by mouth daily before breakfast., Disp: 90 capsule, Rfl: 1   metFORMIN (GLUCOPHAGE-XR) 750 MG 24 hr tablet, Take 1 tablet (750 mg total) by mouth daily with breakfast., Disp: 90 tablet, Rfl: 0   naloxone (NARCAN) 2 MG/2ML injection, Inject 1 mL (1 mg total) into the muscle  as needed for up to 2 doses (for opioid overdose). Inject content of syringe into thigh muscle. Call 911., Disp: 2 mL, Rfl: 1   omega-3 acid ethyl esters (LOVAZA) 1 g capsule, Take 2 capsules (2 g total) by mouth daily., Disp: 180 capsule, Rfl: 1   oxyCODONE-acetaminophen (PERCOCET) 10-325 MG tablet, Take 1 tablet by mouth 2 (two) times daily as needed for pain., Disp: 60 tablet, Rfl: 0   [START ON 10/12/2020] oxyCODONE-acetaminophen (PERCOCET) 10-325 MG tablet, Take 1 tablet by mouth 2 (two) times daily as needed for pain., Disp: 60 tablet, Rfl: 0   OXYCONTIN 40 MG 12 hr tablet, Take 1 tablet (40 mg total) by mouth  2 (two) times daily., Disp: 60 tablet, Rfl: 0   [START ON 10/12/2020] OXYCONTIN 40 MG 12 hr tablet, Take 1 tablet (40 mg total) by mouth 2 (two) times daily., Disp: 60 tablet, Rfl: 0   sildenafil (VIAGRA) 100 MG tablet, Take 1 tablet (100 mg total) by mouth daily as needed for erectile dysfunction., Disp: 30 tablet, Rfl: 0   tamsulosin (FLOMAX) 0.4 MG CAPS capsule, Take 1 capsule (0.4 mg total) by mouth daily., Disp: 90 capsule, Rfl: 1   tiZANidine (ZANAFLEX) 4 MG tablet, Take 1 tablet (4 mg total) by mouth 2 (two) times daily as needed for muscle spasms., Disp: 60 tablet, Rfl: 5   oxyCODONE-acetaminophen (PERCOCET) 10-325 MG tablet, Take 1 tablet by mouth 2 (two) times daily as needed for pain., Disp: 60 tablet, Rfl: 0   OXYCONTIN 40 MG 12 hr tablet, Take 1 tablet (40 mg total) by mouth 2 (two) times daily., Disp: 60 tablet, Rfl: 0  No Known Allergies   ROS  Constitutional: Negative for fever or weight change.  Respiratory: Negative for cough and shortness of breath.   Cardiovascular: Negative for chest pain or palpitations.  Gastrointestinal: Negative for abdominal pain, no bowel changes.  Musculoskeletal: Negative for gait problem or joint swelling.  Skin: Negative for rash.  Neurological: Negative for dizziness or headache.  No other specific complaints in a complete review of systems (except as listed in HPI above).   Objective  Vitals:   10/03/20 0916  BP: 140/86  Pulse: 82  Resp: 16  Temp: 98.2 F (36.8 C)  TempSrc: Oral  SpO2: 96%  Weight: 280 lb (127 kg)  Height: 6' (1.829 m)    Body mass index is 37.97 kg/m.  Physical Exam  Constitutional: Patient appears well-developed and well-nourished. Obese No distress.  HENT: Head: Normocephalic and atraumatic. Ears: B TMs ok, no erythema or effusion; Nose: Nose normal. Mouth/Throat: not done  Eyes: Conjunctivae and EOM are normal. Pupils are equal, round, and reactive to light. No scleral icterus.  Neck: Normal range of  motion. Neck supple. No JVD present. No thyromegaly present.  Cardiovascular: Normal rate, regular rhythm and normal heart sounds.  No murmur heard. No BLE edema. Pulmonary/Chest: Effort normal and breath sounds normal. No respiratory distress. Abdominal: Soft. Bowel sounds are normal, no distension. There is no tenderness. no masses MALE GENITALIA: Normal descended testes bilaterally, no masses palpated, no hernias, no lesions, no discharge RECTAL: Prostate slightly enlarged, but normal consistency, no rectal masses or hemorrhoids Musculoskeletal: scar from previous surgeries on both knees ans spine, Decreased rom of lumbar spine  Neurological: he is alert and oriented to person, place, and time. No cranial nerve deficit. Coordination, balance, strength, speech and gait are normal.  Skin: Skin is warm and dry. No rash noted. No erythema.  Psychiatric: Patient  has a normal mood and affect. behavior is normal. Judgment and thought content normal.   Fall Risk: Fall Risk  10/03/2020 07/30/2020 05/07/2020 05/03/2020 02/06/2020  Falls in the past year? 0 0 0 0 0  Number falls in past yr: 0 - - 0 -  Injury with Fall? 0 - - 0 -  Follow up - - - - -     Functional Status Survey: Is the patient deaf or have difficulty hearing?: No Does the patient have difficulty seeing, even when wearing glasses/contacts?: No Does the patient have difficulty concentrating, remembering, or making decisions?: No Does the patient have difficulty walking or climbing stairs?: No Does the patient have difficulty dressing or bathing?: No Does the patient have difficulty doing errands alone such as visiting a doctor's office or shopping?: No   Assessment & Plan   1. Well adult exam   2. BPH associated with nocturia   Last PSA was at goal   -Prostate cancer screening and PSA options (with potential risks and benefits of testing vs not testing) were discussed along with recent recs/guidelines. -USPSTF grade A and B  recommendations reviewed with patient; age-appropriate recommendations, preventive care, screening tests, etc discussed and encouraged; healthy living encouraged; see AVS for patient education given to patient -Discussed importance of 150 minutes of physical activity weekly, eat two servings of fish weekly, eat one serving of tree nuts ( cashews, pistachios, pecans, almonds.Marland Kitchen) every other day, eat 6 servings of fruit/vegetables daily and drink plenty of water and avoid sweet beverages.  -Reviewed Health Maintenance

## 2020-10-03 ENCOUNTER — Other Ambulatory Visit: Payer: Self-pay

## 2020-10-03 ENCOUNTER — Ambulatory Visit (INDEPENDENT_AMBULATORY_CARE_PROVIDER_SITE_OTHER): Payer: 59 | Admitting: Family Medicine

## 2020-10-03 ENCOUNTER — Encounter: Payer: Self-pay | Admitting: Family Medicine

## 2020-10-03 VITALS — BP 140/86 | HR 82 | Temp 98.2°F | Resp 16 | Ht 72.0 in | Wt 280.0 lb

## 2020-10-03 DIAGNOSIS — Z0000 Encounter for general adult medical examination without abnormal findings: Secondary | ICD-10-CM

## 2020-10-03 DIAGNOSIS — N401 Enlarged prostate with lower urinary tract symptoms: Secondary | ICD-10-CM | POA: Diagnosis not present

## 2020-10-03 DIAGNOSIS — R351 Nocturia: Secondary | ICD-10-CM | POA: Diagnosis not present

## 2020-10-29 ENCOUNTER — Encounter: Payer: Self-pay | Admitting: Student in an Organized Health Care Education/Training Program

## 2020-10-29 ENCOUNTER — Ambulatory Visit
Payer: 59 | Attending: Student in an Organized Health Care Education/Training Program | Admitting: Student in an Organized Health Care Education/Training Program

## 2020-10-29 ENCOUNTER — Other Ambulatory Visit: Payer: Self-pay

## 2020-10-29 VITALS — BP 149/80 | HR 76 | Temp 97.3°F | Resp 16 | Ht 72.0 in | Wt 280.0 lb

## 2020-10-29 DIAGNOSIS — M5136 Other intervertebral disc degeneration, lumbar region: Secondary | ICD-10-CM | POA: Insufficient documentation

## 2020-10-29 DIAGNOSIS — G8929 Other chronic pain: Secondary | ICD-10-CM | POA: Diagnosis present

## 2020-10-29 DIAGNOSIS — Z981 Arthrodesis status: Secondary | ICD-10-CM | POA: Diagnosis present

## 2020-10-29 DIAGNOSIS — M545 Low back pain, unspecified: Secondary | ICD-10-CM | POA: Insufficient documentation

## 2020-10-29 DIAGNOSIS — Z79891 Long term (current) use of opiate analgesic: Secondary | ICD-10-CM | POA: Diagnosis present

## 2020-10-29 DIAGNOSIS — G894 Chronic pain syndrome: Secondary | ICD-10-CM | POA: Diagnosis not present

## 2020-10-29 MED ORDER — OXYCODONE-ACETAMINOPHEN 10-325 MG PO TABS: 1.0000 | ORAL_TABLET | Freq: Two times a day (BID) | ORAL | 0 refills | Status: DC | PRN

## 2020-10-29 MED ORDER — TIZANIDINE HCL 4 MG PO TABS: 4.0000 mg | ORAL_TABLET | Freq: Two times a day (BID) | ORAL | 5 refills | Status: DC | PRN

## 2020-10-29 MED ORDER — OXYCONTIN 40 MG PO T12A: 40.0000 mg | EXTENDED_RELEASE_TABLET | Freq: Two times a day (BID) | ORAL | 0 refills | Status: DC

## 2020-10-29 NOTE — Progress Notes (Signed)
Nursing Pain Medication Assessment:  Safety precautions to be maintained throughout the outpatient stay will include: orient to surroundings, keep bed in low position, maintain call bell within reach at all times, provide assistance with transfer out of bed and ambulation.  Medication Inspection Compliance: Pill count conducted under aseptic conditions, in front of the patient. Neither the pills nor the bottle was removed from the patient's sight at any time. Once count was completed pills were immediately returned to the patient in their original bottle.  Medication #1: Oxycodone/APAP Pill/Patch Count:  29 of 60 pills remain Pill/Patch Appearance: Markings consistent with prescribed medication Bottle Appearance: Standard pharmacy container. Clearly labeled. Filled Date: 07 / 11 / 2022 Last Medication intake:  Today  Medication #2: Oxycodone ER (OxyContin) Pill/Patch Count:  29 of 60 pills remain Pill/Patch Appearance: Markings consistent with prescribed medication Bottle Appearance: Standard pharmacy container. Clearly labeled. Filled Date: 07 / 11 /  2022 Last Medication intake:  Today

## 2020-10-29 NOTE — Progress Notes (Signed)
PROVIDER NOTE: Information contained herein reflects review and annotations entered in association with encounter. Interpretation of such information and data should be left to medically-trained personnel. Information provided to patient can be located elsewhere in the medical record under "Patient Instructions". Document created using STT-dictation technology, any transcriptional errors that may result from process are unintentional.    Patient: Lawrence Thornton.  Service Category: E/M  Provider: Gillis Santa, MD  DOB: 11/20/1955  DOS: 10/29/2020  Specialty: Interventional Pain Management  MRN: [id number]  Setting: Ambulatory outpatient  PCP: Steele Sizer, MD  Type: Established Patient    Referring Provider: Steele Sizer, MD  Location: Office  Delivery: Face-to-face     HPI  Mr. Lawrence Flamenco., a 65 y.o. year old male, is here today because of his S/P lumbar fusion [Z98.1]. [name] primary complain today is Back Pain (Lumbar right side ) Last encounter: My last encounter with him was on 07/30/2020. Pertinent problems: [name] has Chronic right-sided low back pain without sciatica; Continuous opioid dependence ([place]); S/P knee replacement; S/P lumbar fusion; Osteoarthritis of acromioclavicular joint; Long term current use of opiate analgesic; Lumbar spondylosis; Morbid obesity ([place]); and Chronic use of opiate for therapeutic purpose on their pertinent problem list. Pain Assessment: Severity of Chronic pain is reported as a 3 /10. Location: Back Lower, Right/denies. Onset: More than a month ago. Quality: Discomfort, Constant, Sharp, Dull. Timing: Constant. Modifying factor(s): medication helps, exercise such as walking.. Vitals:  height is 6' (1.829 m) and weight is 280 lb (127 kg). His temporal temperature is 97.3 F (36.3 C) (abnormal). His blood pressure is 149/80 (abnormal) and his pulse is 76. His respiration is 16 and oxygen saturation is 100%.   Reason for  encounter: medication management.    No change in medical history since last visit.  Patient's pain is at baseline.  Patient continues multimodal pain regimen as prescribed.  States that it provides pain relief and improvement in functional status. Had follow up with PCP for annual physical, labs WNL    Pharmacotherapy Assessment   10/14/2020  07/30/2020   1  Oxycontin Er 40 Mg Tablet  60.00  30  Bi Lat  [id number]  Wal (0252)  0/0  120.00 MME  Comm Ins  [place]    10/14/2020  07/30/2020   1  Oxycodone-Acetaminophen 10-325  60.00  30  Bi Lat  [id number]  Wal (0252)  0/0  30.00 MME  Comm Ins  [place]       Analgesic: Oxycontin 40 mg BID (MME 120), Percocet 10 mg BID prn(MME 30)   Monitoring: [place] PMP: PDMP reviewed during this encounter.       Pharmacotherapy: No side-effects or adverse reactions reported. Compliance: No problems identified. Effectiveness: Clinically acceptable.  Janett Billow, RN  10/29/2020  8:11 AM  Sign when Signing Visit Nursing Pain Medication Assessment:  Safety precautions to be maintained throughout the outpatient stay will include: orient to surroundings, keep bed in low position, maintain call bell within reach at all times, provide assistance with transfer out of bed and ambulation.  Medication Inspection Compliance: Pill count conducted under aseptic conditions, in front of the patient. Neither the pills nor the bottle was removed from the patient's sight at any time. Once count was completed pills were immediately returned to the patient in their original bottle.  Medication #1: Oxycodone/APAP Pill/Patch Count:  29 of 60 pills remain Pill/Patch Appearance: Markings consistent with prescribed medication Bottle Appearance: Standard pharmacy container. Clearly labeled.  Filled Date: 07 / 11 / 2022 Last Medication intake:  Today  Medication #2: Oxycodone ER (OxyContin) Pill/Patch Count:  29 of 60 pills remain Pill/Patch Appearance: Markings consistent with prescribed  medication Bottle Appearance: Standard pharmacy container. Clearly labeled. Filled Date: 07 / 11 /  2022 Last Medication intake:  Today     UDS:  Summary  Date Value Ref Range Status  05/07/2020 Note  Final    Comment:    ==================================================================== [facility] (MW) ==================================================================== Test                             Result       Flag       Units  Drug Present and Declared for Prescription Verification   Oxycodone                      3071         EXPECTED   ng/mg creat   Oxymorphone                    1724         EXPECTED   ng/mg creat   Noroxycodone                   3771         EXPECTED   ng/mg creat   Noroxymorphone                 827          EXPECTED   ng/mg creat    Sources of oxycodone are scheduled prescription medications.    Oxymorphone, noroxycodone, and noroxymorphone are expected    metabolites of oxycodone. Oxymorphone is also available as a    scheduled prescription medication.  ==================================================================== Test                      Result    Flag   Units      Ref Range   Creatinine              41               mg/dL      >=20 ==================================================================== Declared Medications:  The flagging and interpretation on this report are based on the  following declared medications.  Unexpected results may arise from  inaccuracies in the declared medications.   **Note: The testing scope of this panel includes these medications:   Oxycodone (Oxycontin)  Oxycodone (Percocet)   **Note: The testing scope of this panel does not include the  following reported medications:   Acetaminophen (Percocet)  Amlodipine (Exforge)  Aspirin  Atorvastatin (Lipitor)  Linaclotide (Linzess)  Metformin (Glucophage)  Naloxone (Narcan)  Omega-3 Fatty Acids  Sildenafil (Viagra)  Tamsulosin (Flomax)   Tizanidine (Zanaflex)  Valsartan (Exforge) ==================================================================== For clinical consultation, please call [phone]. ====================================================================      ROS  Constitutional: Denies any fever or chills Gastrointestinal: No reported hemesis, hematochezia, vomiting, or acute GI distress Musculoskeletal: Denies any acute onset joint swelling, redness, loss of ROM, or weakness Neurological: No reported episodes of acute onset apraxia, aphasia, dysarthria, agnosia, amnesia, paralysis, loss of coordination, or loss of consciousness  Medication Review  amLODipine-valsartan, aspirin EC, atorvastatin, linaclotide, metFORMIN, naloxone, omega-3 acid ethyl esters, oxyCODONE, oxyCODONE-acetaminophen, sildenafil, tamsulosin, and tiZANidine  History Review  Allergy: [name] has No Known Allergies. Drug: [name]  reports no history of drug use. Alcohol:  reports no history of alcohol use. Tobacco:  reports that he quit smoking about 21 years ago. His smoking use included cigarettes. He has a 30.00 pack-year smoking history. He has never used smokeless tobacco. Social: [name]  reports that he quit smoking about 21 years ago. His smoking use included cigarettes. He has a 30.00 pack-year smoking history. He has never used smokeless tobacco. He reports that he does not drink alcohol and does not use drugs. Medical:  has a past medical history of Arthritis and Hypertension. Surgical: [name]  has a past surgical history that includes Back surgery (lower back surgery x 3 ); Appendectomy (age 1); sebaceous cyst removed (Right, 15 years ago); Toe Fusion (Right); metal removed from eye (years ago); Total knee arthroplasty (Left, 08/10/2014); Total knee arthroplasty (Right, 12/21/2014); Total knee arthroplasty (Right, 12/21/14); Lumbar fusion (1999); Colonoscopy with propofol (N/A, 05/04/2017); and arthroscopic  shoulder surgery (Right, 08/31/2017). Family: family history includes Cancer in his mother; Heart attack in his mother; Hyperlipidemia in his father; Hypertension in his father.  Laboratory Chemistry Profile   Renal Lab Results  Component Value Date   BUN 10 05/03/2020   CREATININE 0.83 05/03/2020   LABCREA 236 54/27/0623   BCR NOT APPLICABLE 76/28/3151   GFRAA 108 05/03/2020   GFRNONAA 93 05/03/2020     Hepatic Lab Results  Component Value Date   AST 27 05/03/2020   ALT 45 05/03/2020   ALBUMIN 4.6 11/11/2015   ALKPHOS 76 11/11/2015   HCVAB NEGATIVE 10/06/2016     Electrolytes Lab Results  Component Value Date   NA 141 05/03/2020   K 4.9 05/03/2020   CL 105 05/03/2020   CALCIUM 10.1 05/03/2020     Bone Lab Results  Component Value Date   VD25OH 43 10/06/2016     Inflammation (CRP: Acute Phase) (ESR: Chronic Phase) No results found for: CRP, ESRSEDRATE, LATICACIDVEN     Note: Above Lab results reviewed.   Physical Exam  General appearance: Well nourished, well developed, and well hydrated. In no apparent acute distress Mental status: Alert, oriented x 3 (person, place, & time)       Respiratory: No evidence of acute respiratory distress Eyes: PERLA Vitals: BP (!) 149/80 (BP Location: Left Arm, Patient Position: Sitting, Cuff Size: Large)   Pulse 76   Temp (!) 97.3 F (36.3 C) (Temporal)   Resp 16   Ht 6' (1.829 m)   Wt 280 lb (127 kg)   SpO2 100%   BMI 37.97 kg/m  BMI: Estimated body mass index is 37.97 kg/m as calculated from the following:   Height as of this encounter: 6' (1.829 m).   Weight as of this encounter: 280 lb (127 kg). Ideal: Ideal body weight: 77.6 kg (171 lb 1.2 oz) Adjusted ideal body weight: 97.4 kg (214 lb 10.3 oz)     Lumbar Spine Area Exam  Skin & Axial Inspection: Well healed scar from previous spine surgery detected Alignment: Symmetrical Functional ROM: Pain restricted ROM affecting both sides Stability: No instability  detected Muscle Tone/Strength: Functionally intact. No obvious neuro-muscular anomalies detected. Sensory (Neurological): Myotome pain pattern   Gait & Posture Assessment  Ambulation: Unassisted Gait: Relatively normal for age and body habitus Posture: WNL  Lower Extremity Exam      Side: Right lower extremity   Side: Left lower extremity  Stability: No instability observed           Stability: No instability observed  Skin & Extremity Inspection: Skin color, temperature, and hair growth are WNL. No peripheral edema or cyanosis. No masses, redness, swelling, asymmetry, or associated skin lesions. No contractures.   Skin & Extremity Inspection: Skin color, temperature, and hair growth are WNL. No peripheral edema or cyanosis. No masses, redness, swelling, asymmetry, or associated skin lesions. No contractures.  Functional ROM: Unrestricted ROM                   Functional ROM: Unrestricted ROM                  Muscle Tone/Strength: Functionally intact. No obvious neuro-muscular anomalies detected.   Muscle Tone/Strength: Functionally intact. No obvious neuro-muscular anomalies detected.  Sensory (Neurological): Unimpaired         Sensory (Neurological): Unimpaired        DTR: Patellar: deferred today Achilles: deferred today Plantar: deferred today   DTR: Patellar: deferred today Achilles: deferred today Plantar: deferred today  Palpation: No palpable anomalies   Palpation: No palpable anomalies      Assessment   Status Diagnosis  Controlled Controlled Controlled 1. S/P lumbar fusion   2. Chronic pain syndrome   3. Chronic right-sided low back pain without sciatica   4. Lumbar degenerative disc disease   5. Long term prescription opiate use   6. Chronic use of opiate for therapeutic purpose        Plan of Care   [name]. has a current medication list which includes the following long-term medication(s): amlodipine-valsartan, atorvastatin,  linaclotide, metformin, omega-3 acid ethyl esters, sildenafil, [START ON 11/13/2020] oxycodone-acetaminophen, [START ON 12/13/2020] oxycodone-acetaminophen, [START ON 01/12/2021] oxycodone-acetaminophen, [START ON 11/13/2020] oxycontin, [START ON 12/13/2020] oxycontin, and [START ON 01/12/2021] oxycontin.  Pharmacotherapy (Medications Ordered): Meds ordered this encounter  Medications   OXYCONTIN 40 MG 12 hr tablet    Sig: Take 1 tablet (40 mg total) by mouth 2 (two) times daily.    Dispense:  60 tablet    Refill:  0    For chronic pain syndrome   OXYCONTIN 40 MG 12 hr tablet    Sig: Take 1 tablet (40 mg total) by mouth 2 (two) times daily.    Dispense:  60 tablet    Refill:  0    For chronic pain syndrome   OXYCONTIN 40 MG 12 hr tablet    Sig: Take 1 tablet (40 mg total) by mouth 2 (two) times daily.    Dispense:  60 tablet    Refill:  0    For chronic pain syndrome   oxyCODONE-acetaminophen (PERCOCET) 10-325 MG tablet    Sig: Take 1 tablet by mouth 2 (two) times daily as needed for pain.    Dispense:  60 tablet    Refill:  0   oxyCODONE-acetaminophen (PERCOCET) 10-325 MG tablet    Sig: Take 1 tablet by mouth 2 (two) times daily as needed for pain.    Dispense:  60 tablet    Refill:  0   oxyCODONE-acetaminophen (PERCOCET) 10-325 MG tablet    Sig: Take 1 tablet by mouth 2 (two) times daily as needed for pain.    Dispense:  60 tablet    Refill:  0   tiZANidine (ZANAFLEX) 4 MG tablet    Sig: Take 1 tablet (4 mg total) by mouth 2 (two) times daily as needed for muscle spasms.    Dispense:  60 tablet    Refill:  5   -Refill tizanidine, take as needed -  Patient has Narcan at home -UDS up-to-date and appropriate. -Future interventional options could include spinal cord stimulator trial/implant.  Follow-up plan:   Return in about 3 months (around 01/29/2021) for Medication Management, in person.   Recent Visits No visits were found meeting these conditions. Showing recent visits within  past 90 days and meeting all other requirements Today's Visits Date Type Provider Dept  10/29/20 Office Visit Gillis Santa, MD [facility]  Showing today's visits and meeting all other requirements Future Appointments No visits were found meeting these conditions. Showing future appointments within next 90 days and meeting all other requirements I discussed the assessment and treatment plan with the patient. The patient was provided an opportunity to ask questions and all were answered. The patient agreed with the plan and demonstrated an understanding of the instructions.  Patient advised to call back or seek an in-person evaluation if the symptoms or condition worsens.  Duration of encounter: 83mnutes.  Note by: BGillis Santa MD Date: 10/29/2020; Time: 8:33 AM

## 2020-10-31 NOTE — Progress Notes (Signed)
Name: Lawrence Thornton.   MRN: [id number]    DOB: 1956-04-01   Date:11/01/2020       Progress Note  Subjective  Chief Complaint  Follow Up  HPI  DMII: diagnosed May 2019 with a A1C was 6.9% he is on Metformin 750 mg daily and A1C has been at goal, last visit 5.6 % and today is 6.3 % He denies polydipsia ( but states likes water), no polyphagia, has nocturia from BPH.  Denies blurred vision, he is going this Fall. He has a history of high triglycerides but doing well on  Lovaza 2 at night because that is how he can tolerate and also Atorvastatin daily. He has Viagra and takes it prn only.   Dyslipidemia: he is on Lovaza and Atorvastatin and last LDL was 66    Chronic pain and opioid use: he was given Linzess and only takes it prn, very expensive .  He states bowel movements are now daily, but states needs to take medication occasionally to help him have a bowel movement. He is under the care of [facility] and Dr. Cherylann Ratel, pain level now is 3/10 right lower back. He is complaint with his visits and takes medication as prescribed. Heh as bee stable.    Morbid obesity:  BMI above 35 He has multiple co-morbidities: DM, HTN, hyperlipidemia. 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 No side effects of medication BP has been elevated in the 140's, discussed options and we will add HCTZ 12.5 mg ,explained he needs monitor for increase in symptoms BPH and we can combine medication Valsartan, amlodipine hctz once he runs out of medications he has at home   BBPV: it is usually when he first gets up , gets spinning sensation, nausea, no hearing loss. Episodes are intermittent. He has Epley maneuver at home. Denies hearing loss . 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/30/2017   S/P lumbar fusion 07/13/2017   Chronic pain syndrome 07/13/2017   Personal history of colonic polyps    Constipation due to opioid therapy 07/18/2015   Hypertriglyceridemia 01/22/2015   Extreme obesity 01/22/2015   S/P knee replacement 12/21/2014   Chronic right-sided low back pain without sciatica 09/21/2014   BPH associated with nocturia 09/21/2014   Dyslipidemia 09/21/2014   Continuous opioid dependence (HCC) 09/21/2014   Lumbar degenerative disc disease 09/21/2014   Failure of erection 09/21/2014    Past Surgical History:  Procedure Laterality Date   APPENDECTOMY  age 19   arthroscopic shoulder surgery Right 08/31/2017   BACK SURGERY  lower back surgery x 3    L 3 L 4 and L 5 are fused together   COLONOSCOPY WITH PROPOFOL N/A 05/04/2017   Procedure: COLONOSCOPY WITH PROPOFOL;  Surgeon: Midge Minium, MD;  Location: [facility] ENDOSCOPY;  Service: Endoscopy;  Laterality: N/A;   LUMBAR FUSION  1999   L- to L5   metal removed from eye  years ago   sebaceous cyst removed Right 15  years ago   middle  finger    TOE FUSION Right    4th toe   TOTAL KNEE ARTHROPLASTY Left 08/10/2014   Procedure: LEFT TOTAL KNEE ARTHROPLASTY;  Surgeon: Eugenia Mcalpineobert Collins, MD;  Location: [facility] ORS;  Service: Orthopedics;  Laterality: Left;   TOTAL KNEE ARTHROPLASTY Right 12/21/2014   Procedure: RIGHT TOTAL KNEE ARTHROPLASTY;  Surgeon: Eugenia Mcalpineobert Collins, MD;  Location: [facility];  Service: Orthopedics;  Laterality: Right;   TOTAL KNEE ARTHROPLASTY Right 12/21/14    Family History  Problem Relation Age of Onset   Cancer Mother        Throat   Heart attack Mother    Hypertension Father    Hyperlipidemia Father     Social History    Tobacco Use   Smoking status: Former    Packs/day: 1.50    Years: 20.00    Pack years: 30.00    Types: Cigarettes    Quit date: 04/07/1999    Years since quitting: 21.5   Smokeless tobacco: Never   Tobacco comments:    not smoking   Substance Use Topics   Alcohol use: No     Current Outpatient Medications:    amLODipine-valsartan (EXFORGE) 10-320 MG tablet, Take 1 tablet by mouth daily., Disp: 90 tablet, Rfl: 1   aspirin EC 81 MG tablet, Take 1 tablet (81 mg total) by mouth daily., Disp: 30 tablet, Rfl: 0   atorvastatin (LIPITOR) 20 MG tablet, Take 1 tablet (20 mg total) by mouth daily., Disp: 90 tablet, Rfl: 1   linaclotide (LINZESS) 145 MCG CAPS capsule, Take 1 capsule (145 mcg total) by mouth daily before breakfast., Disp: 90 capsule, Rfl: 1   metFORMIN (GLUCOPHAGE-XR) 750 MG 24 hr tablet, Take 1 tablet (750 mg total) by mouth daily with breakfast., Disp: 90 tablet, Rfl: 0   naloxone (NARCAN) 2 MG/2ML injection, Inject 1 mL (1 mg total) into the muscle as needed for up to 2 doses (for opioid overdose). Inject content of syringe into thigh muscle. Call 911., Disp: 2 mL, Rfl: 1   omega-3 acid ethyl esters (LOVAZA) 1 g capsule, Take 2 capsules (2 g total) by mouth daily., Disp: 180 capsule, Rfl: 1   [START ON 11/13/2020] oxyCODONE-acetaminophen (PERCOCET) 10-325 MG tablet, Take 1 tablet by mouth 2 (two) times daily as needed for pain., Disp: 60 tablet, Rfl: 0   [START ON 12/13/2020] oxyCODONE-acetaminophen (PERCOCET) 10-325 MG tablet, Take 1 tablet by mouth 2 (two) times daily as needed for pain., Disp: 60 tablet, Rfl: 0   [START ON 01/12/2021] oxyCODONE-acetaminophen (PERCOCET) 10-325 MG tablet, Take 1 tablet by mouth 2 (two) times daily as needed for pain., Disp: 60 tablet, Rfl: 0   [START ON 11/13/2020] OXYCONTIN 40 MG 12 hr tablet, Take 1 tablet (40 mg total) by mouth 2 (two) times daily., Disp: 60 tablet, Rfl: 0   [START ON 12/13/2020] OXYCONTIN 40 MG 12 hr tablet, Take 1 tablet (40 mg  total) by mouth 2 (two) times daily., Disp: 60 tablet, Rfl: 0   [START ON 01/12/2021] OXYCONTIN 40 MG 12 hr tablet, Take 1 tablet (40 mg total) by mouth 2 (two) times daily., Disp: 60 tablet, Rfl: 0   sildenafil (VIAGRA) 100 MG tablet, Take 1 tablet (100 mg total) by mouth daily as needed for erectile dysfunction., Disp: 30 tablet, Rfl: 0   tamsulosin (FLOMAX) 0.4 MG CAPS capsule, Take 1 capsule (0.4 mg total) by mouth daily., Disp: 90 capsule, Rfl: 1   tiZANidine (ZANAFLEX) 4 MG tablet,  Take 1 tablet (4 mg total) by mouth 2 (two) times daily as needed for muscle spasms., Disp: 60 tablet, Rfl: 5  No Known Allergies  I personally reviewed active problem list, medication list, allergies, family history, social history, health maintenance with the patient/caregiver today.   ROS  Constitutional: Negative for fever , positive for  weight change.  Respiratory: Negative for cough and shortness of breath.   Cardiovascular: Negative for chest pain or palpitations.  Gastrointestinal: Negative for abdominal pain, no bowel changes.  Musculoskeletal: Negative for gait problem or joint swelling.  Skin: Negative for rash.  Neurological: Negative for dizziness or headache.  No other specific complaints in a complete review of systems (except as listed in HPI above).   Objective  Vitals:   11/01/20 0742  BP: 140/82  Pulse: 80  Resp: 16  Temp: 98.2 F (36.8 C)  SpO2: 95%  Weight: 285 lb (129.3 kg)  Height: 6' (1.829 m)    Body mass index is 38.65 kg/m.  Physical Exam  Constitutional: Patient appears well-developed and well-nourished. Obese  No distress.  HEENT: head atraumatic, normocephalic, pupils equal and reactive to light, neck supple Cardiovascular: Normal rate, regular rhythm and normal heart sounds.  No murmur heard. No BLE edema. Pulmonary/Chest: Effort normal and breath sounds normal. No respiratory distress. Abdominal: Soft.  There is no tenderness. Psychiatric: Patient has a  normal mood and affect. behavior is normal. Judgment and thought content normal.   Recent Results (from the past 2160 hour(s))  POCT HgB A1C     Status: Abnormal   Collection Time: 11/01/20  7:47 AM  Result Value Ref Range   Hemoglobin A1C 6.3 (A) 4.0 - 5.6 %   HbA1c POC (<> result, manual entry)     HbA1c, POC (prediabetic range)     HbA1c, POC (controlled diabetic range)      PHQ2/9: Depression screen [facility] 2/9 11/01/2020 10/03/2020 05/03/2020 02/06/2020 01/02/2020  Decreased Interest 0 0 0 0 0  Down, Depressed, Hopeless 0 0 0 0 0  PHQ - 2 Score 0 0 0 0 0  Altered sleeping - - - - -  Tired, decreased energy - - - - -  Change in appetite - - - - -  Feeling bad or failure about yourself  - - - - -  Trouble concentrating - - - - -  Moving slowly or fidgety/restless - - - - -  Suicidal thoughts - - - - -  PHQ-9 Score - - - - -  Difficult doing work/chores - - - - -  Some recent data might be hidden    phq 9 is negative   Fall Risk: Fall Risk  11/01/2020 10/29/2020 10/03/2020 07/30/2020 05/07/2020  Falls in the past year? 0 0 0 0 0  Number falls in past yr: 0 0 0 - -  Injury with Fall? 0 - 0 - -  Risk for fall due to : - No Fall Risks - - -  Follow up - Falls evaluation completed - - -      Functional Status Survey: Is the patient deaf or have difficulty hearing?: No Does the patient have difficulty seeing, even when wearing glasses/contacts?: No Does the patient have difficulty concentrating, remembering, or making decisions?: No Does the patient have difficulty walking or climbing stairs?: No Does the patient have difficulty dressing or bathing?: No Does the patient have difficulty doing errands alone such as visiting a doctor's office or shopping?: No    Assessment &  Plan   1. Dyslipidemia associated with type 2 diabetes mellitus (HCC)  - POCT HgB A1C - atorvastatin (LIPITOR) 20 MG tablet; Take 1 tablet (20 mg total) by mouth daily.  Dispense: 90 tablet; Refill: 1 -  metFORMIN (GLUCOPHAGE-XR) 750 MG 24 hr tablet; Take 1 tablet (750 mg total) by mouth daily with breakfast.  Dispense: 90 tablet; Refill: 1 - omega-3 acid ethyl esters (LOVAZA) 1 g capsule; Take 2 capsules (2 g total) by mouth daily.  Dispense: 180 capsule; Refill: 1  2. Essential hypertension  - amLODIPine-Valsartan-HCTZ 10-320-25 MG TABS; Take 1 tablet by mouth daily. Once finished with valsartan amlodipine and hctz, combo medication  Dispense: 90 tablet; Refill: 0  3. Constipation due to opioid therapy   4. Morbid obesity (HCC)  Discussed with the patient the risk posed by an increased BMI. Discussed importance of portion control, calorie counting and at least 150 minutes of physical activity weekly. Avoid sweet beverages and drink more water. Eat at least 6 servings of fruit and vegetables daily    5. Chronic pain syndrome   6. BPH associated with nocturia   7. Hypertension associated with diabetes (HCC)  - amLODIPine-Valsartan-HCTZ 10-320-25 MG TABS; Take 1 tablet by mouth daily. Once finished with valsartan amlodipine and hctz, combo medication  Dispense: 90 tablet; Refill: 0

## 2020-11-01 ENCOUNTER — Other Ambulatory Visit: Payer: Self-pay

## 2020-11-01 ENCOUNTER — Ambulatory Visit (INDEPENDENT_AMBULATORY_CARE_PROVIDER_SITE_OTHER): Payer: 59 | Admitting: Family Medicine

## 2020-11-01 ENCOUNTER — Encounter: Payer: Self-pay | Admitting: Family Medicine

## 2020-11-01 VITALS — BP 140/82 | HR 80 | Temp 98.2°F | Resp 16 | Ht 72.0 in | Wt 285.0 lb

## 2020-11-01 DIAGNOSIS — I152 Hypertension secondary to endocrine disorders: Secondary | ICD-10-CM

## 2020-11-01 DIAGNOSIS — K5903 Drug induced constipation: Secondary | ICD-10-CM | POA: Diagnosis not present

## 2020-11-01 DIAGNOSIS — I10 Essential (primary) hypertension: Secondary | ICD-10-CM

## 2020-11-01 DIAGNOSIS — E785 Hyperlipidemia, unspecified: Secondary | ICD-10-CM | POA: Diagnosis not present

## 2020-11-01 DIAGNOSIS — G894 Chronic pain syndrome: Secondary | ICD-10-CM

## 2020-11-01 DIAGNOSIS — N401 Enlarged prostate with lower urinary tract symptoms: Secondary | ICD-10-CM

## 2020-11-01 DIAGNOSIS — T402X5A Adverse effect of other opioids, initial encounter: Secondary | ICD-10-CM

## 2020-11-01 DIAGNOSIS — E1159 Type 2 diabetes mellitus with other circulatory complications: Secondary | ICD-10-CM

## 2020-11-01 DIAGNOSIS — E1169 Type 2 diabetes mellitus with other specified complication: Secondary | ICD-10-CM

## 2020-11-01 DIAGNOSIS — R351 Nocturia: Secondary | ICD-10-CM

## 2020-11-01 LAB — POCT GLYCOSYLATED HEMOGLOBIN (HGB A1C): Hemoglobin A1C: 6.3 % — AB (ref 4.0–5.6)

## 2020-11-01 MED ORDER — ATORVASTATIN CALCIUM 20 MG PO TABS: 20.0000 mg | ORAL_TABLET | Freq: Every day | ORAL | 1 refills | Status: DC

## 2020-11-01 MED ORDER — METFORMIN HCL ER 750 MG PO TB24: 750.0000 mg | ORAL_TABLET | Freq: Every day | ORAL | 1 refills | Status: DC

## 2020-11-01 MED ORDER — AMLODIPINE-VALSARTAN-HCTZ 10-320-25 MG PO TABS: 1.0000 | ORAL_TABLET | Freq: Every day | ORAL | 0 refills | Status: DC

## 2020-11-01 MED ORDER — OMEGA-3-ACID ETHYL ESTERS 1 G PO CAPS: 2.0000 g | ORAL_CAPSULE | Freq: Every day | ORAL | 1 refills | Status: DC

## 2020-11-01 MED ORDER — HYDROCHLOROTHIAZIDE 12.5 MG PO TABS: 12.5000 mg | ORAL_TABLET | Freq: Every day | ORAL | 0 refills | Status: DC

## 2020-12-13 ENCOUNTER — Telehealth: Payer: Self-pay

## 2020-12-13 NOTE — Telephone Encounter (Signed)
PA sent covermymed 12/13/20

## 2020-12-13 NOTE — Telephone Encounter (Signed)
Pharmacy called and stated that they are needing PA they faxed the form over

## 2020-12-23 ENCOUNTER — Other Ambulatory Visit: Payer: Self-pay | Admitting: Family Medicine

## 2020-12-23 DIAGNOSIS — E1169 Type 2 diabetes mellitus with other specified complication: Secondary | ICD-10-CM

## 2021-01-17 ENCOUNTER — Other Ambulatory Visit: Payer: Self-pay | Admitting: Family Medicine

## 2021-01-17 DIAGNOSIS — N401 Enlarged prostate with lower urinary tract symptoms: Secondary | ICD-10-CM

## 2021-01-28 ENCOUNTER — Ambulatory Visit
Payer: 59 | Attending: Student in an Organized Health Care Education/Training Program | Admitting: Student in an Organized Health Care Education/Training Program

## 2021-01-28 ENCOUNTER — Encounter: Payer: Self-pay | Admitting: Student in an Organized Health Care Education/Training Program

## 2021-01-28 ENCOUNTER — Other Ambulatory Visit: Payer: Self-pay

## 2021-01-28 VITALS — BP 163/82 | HR 77 | Temp 97.7°F | Resp 16 | Ht 72.0 in | Wt 283.0 lb

## 2021-01-28 DIAGNOSIS — G8929 Other chronic pain: Secondary | ICD-10-CM

## 2021-01-28 DIAGNOSIS — Z79891 Long term (current) use of opiate analgesic: Secondary | ICD-10-CM | POA: Diagnosis present

## 2021-01-28 DIAGNOSIS — Z981 Arthrodesis status: Secondary | ICD-10-CM | POA: Diagnosis not present

## 2021-01-28 DIAGNOSIS — G894 Chronic pain syndrome: Secondary | ICD-10-CM

## 2021-01-28 DIAGNOSIS — M5136 Other intervertebral disc degeneration, lumbar region: Secondary | ICD-10-CM | POA: Diagnosis not present

## 2021-01-28 DIAGNOSIS — M545 Low back pain, unspecified: Secondary | ICD-10-CM | POA: Diagnosis not present

## 2021-01-28 MED ORDER — OXYCONTIN 40 MG PO T12A: 40.0000 mg | EXTENDED_RELEASE_TABLET | Freq: Two times a day (BID) | ORAL | 0 refills | Status: DC

## 2021-01-28 MED ORDER — OXYCODONE-ACETAMINOPHEN 10-325 MG PO TABS: 1.0000 | ORAL_TABLET | Freq: Two times a day (BID) | ORAL | 0 refills | Status: DC | PRN

## 2021-01-28 NOTE — Progress Notes (Signed)
Nursing Pain Medication Assessment:  Safety precautions to be maintained throughout the outpatient stay will include: orient to surroundings, keep bed in low position, maintain call bell within reach at all times, provide assistance with transfer out of bed and ambulation.  Medication Inspection Compliance: Pill count conducted under aseptic conditions, in front of the patient. Neither the pills nor the bottle was removed from the patient's sight at any time. Once count was completed pills were immediately returned to the patient in their original bottle.  Medication #1: Hydrocodone/APAP Pill/Patch Count:  30 of 60 pills remain Pill/Patch Appearance: Markings consistent with prescribed medication Bottle Appearance: Standard pharmacy container. Clearly labeled. Filled Date: 41 / 10 / 2022 Last Medication intake:  Today  Medication #2: Oxycodone ER (OxyContin) Pill/Patch Count:  30 of 60 pills remain Pill/Patch Appearance: Markings consistent with prescribed medication Bottle Appearance: No container available. Did not bring bottle(s) to appointment. Filled Date: 24 / 10 /  2022 Last Medication intake:  Today

## 2021-01-28 NOTE — Progress Notes (Signed)
PROVIDER NOTE: Information contained herein reflects review and annotations entered in association with encounter. Interpretation of such information and data should be left to medically-trained personnel. Information provided to patient can be located elsewhere in the medical record under "Patient Instructions". Document created using STT-dictation technology, any transcriptional errors that may result from process are unintentional.    Patient: Lawrence Thornton.  Service Category: E/M  Provider: Gillis Santa, MD  DOB: May 24, 1955  DOS: 01/28/2021  Specialty: Interventional Pain Management  MRN: [id number]  Setting: Ambulatory outpatient  PCP: Steele Sizer, MD  Type: Established Patient    Referring Provider: Steele Sizer, MD  Location: Office  Delivery: Face-to-face     HPI  Mr. Jeryn Cerney., a 65 y.o. year old male, is here today because of his Chronic pain syndrome [G89.4]. Mr. Yeldell primary complain today is Back Pain (Lumbar right ) Last encounter: My last encounter with him was on 10/29/2020. Pertinent problems: Mr. Po has Chronic right-sided low back pain without sciatica; Continuous opioid dependence ([place]); S/P knee replacement; S/P lumbar fusion; Osteoarthritis of acromioclavicular joint; Long term current use of opiate analgesic; Lumbar spondylosis; Morbid obesity ([place]); and Chronic use of opiate for therapeutic purpose on their pertinent problem list. Pain Assessment: Severity of Chronic pain is reported as a 3 /10. Location: Back Lower, Right/denies. Onset: More than a month ago. Quality: Discomfort, Dull, Burning. Timing: Intermittent. Modifying factor(s): medications. Vitals:  height is 6' (1.829 m) and weight is 283 lb (128.4 kg). His temporal temperature is 97.7 F (36.5 C). His blood pressure is 163/82 (abnormal) and his pulse is 77. His respiration is 16 and oxygen saturation is 100%.   Reason for encounter: medication management.    No change in  medical history since last visit.  Patient's pain is at baseline.  Patient continues multimodal pain regimen as prescribed.  States that it provides pain relief and improvement in functional status. Patient states that he has been having difficulty getting his OxyContin covered by insurance.  I discussed alternatives with him.  He has been on OxyContin for many years and is hesitant to change.  Unfortunately he has tried other long-acting analgesics with inferior response.  Pharmacotherapy Assessment     Analgesic: Oxycontin 40 mg BID (MME 120), Percocet 10 mg BID prn(MME 30)   Monitoring: [place] PMP: PDMP reviewed during this encounter.       Pharmacotherapy: No side-effects or adverse reactions reported. Compliance: No problems identified. Effectiveness: Clinically acceptable.  Janett Billow, RN  01/28/2021  9:28 AM  Sign when Signing Visit Nursing Pain Medication Assessment:  Safety precautions to be maintained throughout the outpatient stay will include: orient to surroundings, keep bed in low position, maintain call bell within reach at all times, provide assistance with transfer out of bed and ambulation.  Medication Inspection Compliance: Pill count conducted under aseptic conditions, in front of the patient. Neither the pills nor the bottle was removed from the patient's sight at any time. Once count was completed pills were immediately returned to the patient in their original bottle.  Medication #1: Hydrocodone/APAP Pill/Patch Count:  30 of 60 pills remain Pill/Patch Appearance: Markings consistent with prescribed medication Bottle Appearance: Standard pharmacy container. Clearly labeled. Filled Date: 93 / 10 / 2022 Last Medication intake:  Today  Medication #2: Oxycodone ER (OxyContin) Pill/Patch Count:  30 of 60 pills remain Pill/Patch Appearance: Markings consistent with prescribed medication Bottle Appearance: No container available. Did not bring bottle(s) to  appointment. Filled Date:  10 / 10 /  2022 Last Medication intake:  Today      UDS:  Summary  Date Value Ref Range Status  05/07/2020 Note  Final    Comment:    ==================================================================== [employer] Select 13 (MW) ==================================================================== Test                             Result       Flag       Units  Drug Present and Declared for Prescription Verification   Oxycodone                      3071         EXPECTED   ng/mg creat   Oxymorphone                    1724         EXPECTED   ng/mg creat   Noroxycodone                   3771         EXPECTED   ng/mg creat   Noroxymorphone                 827          EXPECTED   ng/mg creat    Sources of oxycodone are scheduled prescription medications.    Oxymorphone, noroxycodone, and noroxymorphone are expected    metabolites of oxycodone. Oxymorphone is also available as a    scheduled prescription medication.  ==================================================================== Test                      Result    Flag   Units      Ref Range   Creatinine              41               mg/dL      >=20 ==================================================================== Declared Medications:  The flagging and interpretation on this report are based on the  following declared medications.  Unexpected results may arise from  inaccuracies in the declared medications.   **Note: The testing scope of this panel includes these medications:   Oxycodone (Oxycontin)  Oxycodone (Percocet)   **Note: The testing scope of this panel does not include the  following reported medications:   Acetaminophen (Percocet)  Amlodipine (Exforge)  Aspirin  Atorvastatin (Lipitor)  Linaclotide (Linzess)  Metformin (Glucophage)  Naloxone (Narcan)  Omega-3 Fatty Acids  Sildenafil (Viagra)  Tamsulosin (Flomax)  Tizanidine (Zanaflex)  Valsartan  (Exforge) ==================================================================== For clinical consultation, please call [phone]. ====================================================================      ROS  Constitutional: Denies any fever or chills Gastrointestinal: No reported hemesis, hematochezia, vomiting, or acute GI distress Musculoskeletal: Denies any acute onset joint swelling, redness, loss of ROM, or weakness Neurological: No reported episodes of acute onset apraxia, aphasia, dysarthria, agnosia, amnesia, paralysis, loss of coordination, or loss of consciousness  Medication Review  amLODIPine-Valsartan-HCTZ, aspirin EC, atorvastatin, hydrochlorothiazide, linaclotide, metFORMIN, naloxone, omega-3 acid ethyl esters, oxyCODONE, oxyCODONE-acetaminophen, sildenafil, tamsulosin, and tiZANidine  History Review  Allergy: Mr. Hellums has No Known Allergies. Drug: Mr. Pilger  reports no history of drug use. Alcohol:  reports no history of alcohol use. Tobacco:  reports that he quit smoking about 21 years ago. His smoking use included cigarettes. He has a 30.00 pack-year smoking history. He has never used  smokeless tobacco. Social: Mr. Rasmusson  reports that he quit smoking about 21 years ago. His smoking use included cigarettes. He has a 30.00 pack-year smoking history. He has never used smokeless tobacco. He reports that he does not drink alcohol and does not use drugs. Medical:  has a past medical history of Arthritis and Hypertension. Surgical: Mr. Rachel  has a past surgical history that includes Back surgery (lower back surgery x 3 ); Appendectomy (age 80); sebaceous cyst removed (Right, 15 years ago); Toe Fusion (Right); metal removed from eye (years ago); Total knee arthroplasty (Left, 08/10/2014); Total knee arthroplasty (Right, 12/21/2014); Total knee arthroplasty (Right, 12/21/14); Lumbar fusion (1999); Colonoscopy with propofol (N/A, 05/04/2017); and arthroscopic shoulder  surgery (Right, 08/31/2017). Family: family history includes Cancer in his mother; Heart attack in his mother; Hyperlipidemia in his father; Hypertension in his father.  Laboratory Chemistry Profile   Renal Lab Results  Component Value Date   BUN 10 05/03/2020   CREATININE 0.83 05/03/2020   LABCREA 236 58/52/7782   BCR NOT APPLICABLE 42/35/3614   GFRAA 108 05/03/2020   GFRNONAA 93 05/03/2020     Hepatic Lab Results  Component Value Date   AST 27 05/03/2020   ALT 45 05/03/2020   ALBUMIN 4.6 11/11/2015   ALKPHOS 76 11/11/2015   HCVAB NEGATIVE 10/06/2016     Electrolytes Lab Results  Component Value Date   NA 141 05/03/2020   K 4.9 05/03/2020   CL 105 05/03/2020   CALCIUM 10.1 05/03/2020     Bone Lab Results  Component Value Date   VD25OH 43 10/06/2016     Inflammation (CRP: Acute Phase) (ESR: Chronic Phase) No results found for: CRP, ESRSEDRATE, LATICACIDVEN     Note: Above Lab results reviewed.   Physical Exam  General appearance: Well nourished, well developed, and well hydrated. In no apparent acute distress Mental status: Alert, oriented x 3 (person, place, & time)       Respiratory: No evidence of acute respiratory distress Eyes: PERLA Vitals: BP (!) 163/82 (BP Location: Left Arm, Patient Position: Sitting, Cuff Size: Normal)   Pulse 77   Temp 97.7 F (36.5 C) (Temporal)   Resp 16   Ht 6' (1.829 m)   Wt 283 lb (128.4 kg)   SpO2 100%   BMI 38.38 kg/m  BMI: Estimated body mass index is 38.38 kg/m as calculated from the following:   Height as of this encounter: 6' (1.829 m).   Weight as of this encounter: 283 lb (128.4 kg). Ideal: Ideal body weight: 77.6 kg (171 lb 1.2 oz) Adjusted ideal body weight: 97.9 kg (215 lb 13.5 oz)     Lumbar Spine Area Exam  Skin & Axial Inspection: Well healed scar from previous spine surgery detected Alignment: Symmetrical Functional ROM: Pain restricted ROM affecting both sides Stability: No instability  detected Muscle Tone/Strength: Functionally intact. No obvious neuro-muscular anomalies detected. Sensory (Neurological): Myotome pain pattern   Gait & Posture Assessment  Ambulation: Unassisted Gait: Relatively normal for age and body habitus Posture: WNL  Lower Extremity Exam      Side: Right lower extremity   Side: Left lower extremity  Stability: No instability observed           Stability: No instability observed          Skin & Extremity Inspection: Skin color, temperature, and hair growth are WNL. No peripheral edema or cyanosis. No masses, redness, swelling, asymmetry, or associated skin lesions. No contractures.   Skin & Extremity  Inspection: Skin color, temperature, and hair growth are WNL. No peripheral edema or cyanosis. No masses, redness, swelling, asymmetry, or associated skin lesions. No contractures.  Functional ROM: Unrestricted ROM                   Functional ROM: Unrestricted ROM                  Muscle Tone/Strength: Functionally intact. No obvious neuro-muscular anomalies detected.   Muscle Tone/Strength: Functionally intact. No obvious neuro-muscular anomalies detected.  Sensory (Neurological): Unimpaired         Sensory (Neurological): Unimpaired        DTR: Patellar: deferred today Achilles: deferred today Plantar: deferred today   DTR: Patellar: deferred today Achilles: deferred today Plantar: deferred today  Palpation: No palpable anomalies   Palpation: No palpable anomalies      Assessment   Status Diagnosis  Controlled Controlled Controlled 1. Chronic pain syndrome   2. S/P lumbar fusion   3. Chronic right-sided low back pain without sciatica   4. Lumbar degenerative disc disease   5. Long term prescription opiate use   6. Chronic use of opiate for therapeutic purpose   7. Encounter for long-term opiate analgesic use         Plan of Care   Mr. Justyce Baby. has a current medication list which includes the following long-term  medication(s): amlodipine-valsartan-hctz, atorvastatin, linaclotide, metformin, omega-3 acid ethyl esters, sildenafil, [START ON 02/12/2021] oxycodone-acetaminophen, [START ON 03/14/2021] oxycodone-acetaminophen, [START ON 04/13/2021] oxycodone-acetaminophen, [START ON 02/12/2021] oxycontin, [START ON 03/14/2021] oxycontin, and [START ON 04/13/2021] oxycontin.  Pharmacotherapy (Medications Ordered): Meds ordered this encounter  Medications   oxyCODONE-acetaminophen (PERCOCET) 10-325 MG tablet    Sig: Take 1 tablet by mouth 2 (two) times daily as needed for pain.    Dispense:  60 tablet    Refill:  0   oxyCODONE-acetaminophen (PERCOCET) 10-325 MG tablet    Sig: Take 1 tablet by mouth 2 (two) times daily as needed for pain.    Dispense:  60 tablet    Refill:  0   oxyCODONE-acetaminophen (PERCOCET) 10-325 MG tablet    Sig: Take 1 tablet by mouth 2 (two) times daily as needed for pain.    Dispense:  60 tablet    Refill:  0   OXYCONTIN 40 MG 12 hr tablet    Sig: Take 1 tablet (40 mg total) by mouth 2 (two) times daily.    Dispense:  60 tablet    Refill:  0    For chronic pain syndrome   OXYCONTIN 40 MG 12 hr tablet    Sig: Take 1 tablet (40 mg total) by mouth 2 (two) times daily.    Dispense:  60 tablet    Refill:  0    For chronic pain syndrome   OXYCONTIN 40 MG 12 hr tablet    Sig: Take 1 tablet (40 mg total) by mouth 2 (two) times daily.    Dispense:  60 tablet    Refill:  0    For chronic pain syndrome   Orders Placed This Encounter  Procedures   ToxASSURE Select 13 (MW), Urine    Volume: 30 ml(s). Minimum 3 ml of urine is needed. Document temperature of fresh sample. Indications: Long term (current) use of opiate analgesic (Q22.297)    Order Specific Question:   Release to patient    Answer:   Immediate     -Continue tizanidine, take as needed -Patient has  Narcan at home -UDS today. -Future interventional options could include spinal cord stimulator trial/implant.  Follow-up  plan:   Return in about 3 months (around 04/30/2021) for Medication Management, in person.   Recent Visits No visits were found meeting these conditions. Showing recent visits within past 90 days and meeting all other requirements Today's Visits Date Type Provider Dept  01/28/21 Office Visit Gillis Santa, MD [facility]  Showing today's visits and meeting all other requirements Future Appointments No visits were found meeting these conditions. Showing future appointments within next 90 days and meeting all other requirements I discussed the assessment and treatment plan with the patient. The patient was provided an opportunity to ask questions and all were answered. The patient agreed with the plan and demonstrated an understanding of the instructions.  Patient advised to call back or seek an in-person evaluation if the symptoms or condition worsens.  Duration of encounter: 54mnutes.  Note by: BGillis Santa MD Date: 01/28/2021; Time: 9:52 AM

## 2021-01-31 LAB — TOXASSURE SELECT 13 (MW), URINE

## 2021-02-10 ENCOUNTER — Other Ambulatory Visit: Payer: Self-pay | Admitting: Family Medicine

## 2021-02-10 DIAGNOSIS — I10 Essential (primary) hypertension: Secondary | ICD-10-CM

## 2021-02-10 DIAGNOSIS — I152 Hypertension secondary to endocrine disorders: Secondary | ICD-10-CM

## 2021-02-10 DIAGNOSIS — E1159 Type 2 diabetes mellitus with other circulatory complications: Secondary | ICD-10-CM

## 2021-04-15 ENCOUNTER — Other Ambulatory Visit: Payer: Self-pay | Admitting: Family Medicine

## 2021-04-15 DIAGNOSIS — E1169 Type 2 diabetes mellitus with other specified complication: Secondary | ICD-10-CM

## 2021-04-15 NOTE — Telephone Encounter (Signed)
Requested Prescriptions  °Pending Prescriptions Disp Refills  °• metFORMIN (GLUCOPHAGE-XR) 750 MG 24 hr tablet [Pharmacy Med Name: METFORMIN ER 750MG 24HR TABS] 90 tablet 0  °  Sig: TAKE 1 TABLET(750 MG) BY MOUTH DAILY WITH BREAKFAST  °  ° Endocrinology:  Diabetes - Biguanides Passed - 04/15/2021  9:56 AM  °  °  Passed - Cr in normal range and within 360 days  °  Creat  °Date Value Ref Range Status  °05/03/2020 0.83 0.70 - 1.25 mg/dL Final  °  Comment:  °  For patients >49 years of age, the reference limit °for Creatinine is approximately 13% higher for people °identified as African-American. °. °  ° °Creatinine, Urine  °Date Value Ref Range Status  °05/03/2020 236 20 - 320 mg/dL Final  °   °  °  Passed - HBA1C is between 0 and 7.9 and within 180 days  °  Hemoglobin A1C  °Date Value Ref Range Status  °11/01/2020 6.3 (A) 4.0 - 5.6 % Final  ° °HbA1c, POC (controlled diabetic range)  °Date Value Ref Range Status  °01/31/2019 5.8 0.0 - 7.0 % Final  °   °  °  Passed - eGFR in normal range and within 360 days  °  GFR, Est African American  °Date Value Ref Range Status  °05/03/2020 108 > OR = 60 mL/min/1.73m2 Final  ° °GFR, Est Non African American  °Date Value Ref Range Status  °05/03/2020 93 > OR = 60 mL/min/1.73m2 Final  °   °  °  Passed - Valid encounter within last 6 months  °  Recent Outpatient Visits   °      ° 5 months ago Dyslipidemia associated with type 2 diabetes mellitus (HCC)  ° [facility] Sowles, Krichna, MD  ° 6 months ago Well adult exam  ° [facility] Sowles, Krichna, MD  ° 11 months ago Dyslipidemia associated with type 2 diabetes mellitus (HCC)  ° [facility] Sowles, Krichna, MD  ° 1 year ago Dyslipidemia associated with type 2 diabetes mellitus (HCC)  ° [facility] Sowles, Krichna, MD  ° 1 year ago Dyslipidemia associated with type 2 diabetes mellitus (HCC)  ° [facility] Sowles, Krichna, MD  °  °   °Future Appointments   °        ° In 2 weeks Sowles, Krichna, MD [facility]  ° In 5 months Sowles, Krichna, MD [facility]  °  ° °  °  °  ° °

## 2021-04-29 ENCOUNTER — Ambulatory Visit
Payer: Medicare Other | Attending: Student in an Organized Health Care Education/Training Program | Admitting: Student in an Organized Health Care Education/Training Program

## 2021-04-29 ENCOUNTER — Encounter: Payer: Self-pay | Admitting: Student in an Organized Health Care Education/Training Program

## 2021-04-29 ENCOUNTER — Other Ambulatory Visit: Payer: Self-pay

## 2021-04-29 VITALS — BP 147/80 | HR 80 | Temp 98.1°F | Ht 72.0 in | Wt 279.0 lb

## 2021-04-29 DIAGNOSIS — Z79891 Long term (current) use of opiate analgesic: Secondary | ICD-10-CM

## 2021-04-29 DIAGNOSIS — M5136 Other intervertebral disc degeneration, lumbar region: Secondary | ICD-10-CM

## 2021-04-29 DIAGNOSIS — G8929 Other chronic pain: Secondary | ICD-10-CM | POA: Diagnosis not present

## 2021-04-29 DIAGNOSIS — G894 Chronic pain syndrome: Secondary | ICD-10-CM

## 2021-04-29 DIAGNOSIS — Z981 Arthrodesis status: Secondary | ICD-10-CM

## 2021-04-29 DIAGNOSIS — M545 Low back pain, unspecified: Secondary | ICD-10-CM | POA: Diagnosis not present

## 2021-04-29 MED ORDER — OXYCODONE-ACETAMINOPHEN 10-325 MG PO TABS: 1.0000 | ORAL_TABLET | Freq: Two times a day (BID) | ORAL | 0 refills | Status: DC | PRN

## 2021-04-29 MED ORDER — OXYCONTIN 40 MG PO T12A: 40.0000 mg | EXTENDED_RELEASE_TABLET | Freq: Two times a day (BID) | ORAL | 0 refills | Status: DC

## 2021-04-29 NOTE — Progress Notes (Signed)
Nursing Pain Medication Assessment:  Safety precautions to be maintained throughout the outpatient stay will include: orient to surroundings, keep bed in low position, maintain call bell within reach at all times, provide assistance with transfer out of bed and ambulation.  Medication Inspection Compliance: Pill count conducted under aseptic conditions, in front of the patient. Neither the pills nor the bottle was removed from the patient's sight at any time. Once count was completed pills were immediately returned to the patient in their original bottle.  Medication #1: Oxycodone ER (OxyContin) Pill/Patch Count:  29 of 60 pills remain Pill/Patch Appearance: Markings consistent with prescribed medication Bottle Appearance: Standard pharmacy container. Clearly labeled. Filled Date: 01 / 09 / 2023 Last Medication intake:  Today  Medication #2: Oxycodone/APAP Pill/Patch Count:  29 of 60 pills remain Pill/Patch Appearance: Markings consistent with prescribed medication Bottle Appearance: Standard pharmacy container. Clearly labeled. Filled Date: 01 / 09 / 2023 Last Medication intake:  Today

## 2021-04-29 NOTE — Progress Notes (Signed)
PROVIDER NOTE: Information contained herein reflects review and annotations entered in association with encounter. Interpretation of such information and data should be left to medically-trained personnel. Information provided to patient can be located elsewhere in the medical record under "Patient Instructions". Document created using STT-dictation technology, any transcriptional errors that may result from process are unintentional.    Patient: Lawrence Thornton.  Service Category: E/M  Provider: Gillis Santa, MD  DOB: 02-27-1956  DOS: 04/29/2021  Specialty: Interventional Pain Management  MRN: [id number]  Setting: Ambulatory outpatient  PCP: Steele Sizer, MD  Type: Established Patient    Referring Provider: Steele Sizer, MD  Location: Office  Delivery: Face-to-face     HPI  Mr. Brenner Visconti., a 66 y.o. year old male, is here today because of his Chronic pain syndrome [G89.4]. Mr. Slowey primary complain today is Back Pain (Lumbar right )  Last encounter: My last encounter with him was on 01/28/21 Pertinent problems: Mr. Gendreau has Chronic right-sided low back pain without sciatica; Continuous opioid dependence ([place]); S/P knee replacement; S/P lumbar fusion; Osteoarthritis of acromioclavicular joint; Long term current use of opiate analgesic; Lumbar spondylosis; Morbid obesity ([place]); and Chronic use of opiate for therapeutic purpose on their pertinent problem list. Pain Assessment: Severity of Chronic pain is reported as a 4 /10. Location: Back Lower, Right/denies. Onset: More than a month ago. Quality: Discomfort, Constant, Dull. Timing: Constant. Modifying factor(s): exercise and medications.. Vitals:  height is 6' (1.829 m) and weight is 279 lb (126.6 kg). His temporal temperature is 98.1 F (36.7 C). His blood pressure is 147/80 (abnormal) and his pulse is 80. His oxygen saturation is 98%.   Reason for encounter: medication management.    No change in medical history  since last visit.  Patient's pain is at baseline.  Patient continues multimodal pain regimen as prescribed.  States that it provides pain relief and improvement in functional status. Had increased pain Jan 27 when he bent down to lift something, however that has gotten better over the last 3 weeks with rest and stretching, likely muscle strain   Pharmacotherapy Assessment     Analgesic: Oxycontin 40 mg BID (MME 120), Percocet 10 mg BID prn(MME 30)   Monitoring: [place] PMP: PDMP reviewed during this encounter.       Pharmacotherapy: No side-effects or adverse reactions reported. Compliance: No problems identified. Effectiveness: Clinically acceptable.  Janett Billow, RN  04/29/2021  9:38 AM  Sign when Signing Visit Nursing Pain Medication Assessment:  Safety precautions to be maintained throughout the outpatient stay will include: orient to surroundings, keep bed in low position, maintain call bell within reach at all times, provide assistance with transfer out of bed and ambulation.  Medication Inspection Compliance: Pill count conducted under aseptic conditions, in front of the patient. Neither the pills nor the bottle was removed from the patient's sight at any time. Once count was completed pills were immediately returned to the patient in their original bottle.  Medication #1: Oxycodone ER (OxyContin) Pill/Patch Count:  29 of 60 pills remain Pill/Patch Appearance: Markings consistent with prescribed medication Bottle Appearance: Standard pharmacy container. Clearly labeled. Filled Date: 01 / 09 / 2023 Last Medication intake:  Today  Medication #2: Oxycodone/APAP Pill/Patch Count:  29 of 60 pills remain Pill/Patch Appearance: Markings consistent with prescribed medication Bottle Appearance: Standard pharmacy container. Clearly labeled. Filled Date: 01 / 09 / 2023 Last Medication intake:  Today     UDS:  Summary  Date Value Ref  Range Status  01/28/2021 Note  Final     Comment:    ==================================================================== [facility] (MW) ==================================================================== Test                             Result       Flag       Units  Drug Present and Declared for Prescription Verification   Oxycodone                      3269         EXPECTED   ng/mg creat   Oxymorphone                    4557         EXPECTED   ng/mg creat   Noroxycodone                   4000         EXPECTED   ng/mg creat   Noroxymorphone                 1386         EXPECTED   ng/mg creat    Sources of oxycodone are scheduled prescription medications.    Oxymorphone, noroxycodone, and noroxymorphone are expected    metabolites of oxycodone. Oxymorphone is also available as a    scheduled prescription medication.  ==================================================================== Test                      Result    Flag   Units      Ref Range   Creatinine              74               mg/dL      >=20 ==================================================================== Declared Medications:  The flagging and interpretation on this report are based on the  following declared medications.  Unexpected results may arise from  inaccuracies in the declared medications.   **Note: The testing scope of this panel includes these medications:   Oxycodone (Oxycontin)  Oxycodone (Percocet)   **Note: The testing scope of this panel does not include the  following reported medications:   Acetaminophen (Percocet)  Amlodipine  Aspirin  Atorvastatin  Hydrochlorothiazide  Linaclotide (Linzess)  Metformin (Glucophage)  Naloxone (Narcan)  Omega-3 Fatty Acids  Sildenafil (Viagra)  Tamsulosin (Flomax)  Tizanidine (Zanaflex)  Valsartan ==================================================================== For clinical consultation, please call [phone]. ====================================================================      ROS  Constitutional: Denies any fever or chills Gastrointestinal: No reported hemesis, hematochezia, vomiting, or acute GI distress Musculoskeletal: Denies any acute onset joint swelling, redness, loss of ROM, or weakness Neurological: No reported episodes of acute onset apraxia, aphasia, dysarthria, agnosia, amnesia, paralysis, loss of coordination, or loss of consciousness  Medication Review  amLODIPine-Valsartan-HCTZ, aspirin EC, atorvastatin, hydrochlorothiazide, linaclotide, metFORMIN, naloxone, omega-3 acid ethyl esters, oxyCODONE, oxyCODONE-acetaminophen, sildenafil, tamsulosin, and tiZANidine  History Review  Allergy: Mr. Devol has No Known Allergies. Drug: Mr. Nazari  reports no history of drug use. Alcohol:  reports no history of alcohol use. Tobacco:  reports that he quit smoking about 22 years ago. His smoking use included cigarettes. He has a 30.00 pack-year smoking history. He has never used smokeless tobacco. Social: Mr. Bounds  reports that he quit smoking about 22 years ago. His smoking use included cigarettes. He has a 30.00  pack-year smoking history. He has never used smokeless tobacco. He reports that he does not drink alcohol and does not use drugs. Medical:  has a past medical history of Arthritis and Hypertension. Surgical: Mr. Westrup  has a past surgical history that includes Back surgery (lower back surgery x 3 ); Appendectomy (age 12); sebaceous cyst removed (Right, 15 years ago); Toe Fusion (Right); metal removed from eye (years ago); Total knee arthroplasty (Left, 08/10/2014); Total knee arthroplasty (Right, 12/21/2014); Total knee arthroplasty (Right, 12/21/14); Lumbar fusion (1999); Colonoscopy with propofol (N/A, 05/04/2017); and arthroscopic shoulder surgery (Right, 08/31/2017). Family: family history includes Cancer in his mother; Heart attack in his mother; Hyperlipidemia in  his father; Hypertension in his father.  Laboratory Chemistry Profile   Renal Lab Results  Component Value Date   BUN 10 05/03/2020   CREATININE 0.83 05/03/2020   LABCREA 236 15/08/6977   BCR NOT APPLICABLE 48/04/6551   GFRAA 108 05/03/2020   GFRNONAA 93 05/03/2020     Hepatic Lab Results  Component Value Date   AST 27 05/03/2020   ALT 45 05/03/2020   ALBUMIN 4.6 11/11/2015   ALKPHOS 76 11/11/2015   HCVAB NEGATIVE 10/06/2016     Electrolytes Lab Results  Component Value Date   NA 141 05/03/2020   K 4.9 05/03/2020   CL 105 05/03/2020   CALCIUM 10.1 05/03/2020     Bone Lab Results  Component Value Date   VD25OH 43 10/06/2016     Inflammation (CRP: Acute Phase) (ESR: Chronic Phase) No results found for: CRP, ESRSEDRATE, LATICACIDVEN     Note: Above Lab results reviewed.   Physical Exam  General appearance: Well nourished, well developed, and well hydrated. In no apparent acute distress Mental status: Alert, oriented x 3 (person, place, & time)       Respiratory: No evidence of acute respiratory distress Eyes: PERLA Vitals: BP (!) 147/80 (BP Location: Left Arm, Patient Position: Sitting, Cuff Size: Large)    Pulse 80    Temp 98.1 F (36.7 C) (Temporal)    Ht 6' (1.829 m)    Wt 279 lb (126.6 kg)    SpO2 98%    BMI 37.84 kg/m  BMI: Estimated body mass index is 37.84 kg/m as calculated from the following:   Height as of this encounter: 6' (1.829 m).   Weight as of this encounter: 279 lb (126.6 kg). Ideal: Ideal body weight: 77.6 kg (171 lb 1.2 oz) Adjusted ideal body weight: 97.2 kg (214 lb 3.9 oz)     Lumbar Spine Area Exam  Skin & Axial Inspection: Well healed scar from previous spine surgery detected Alignment: Symmetrical Functional ROM: Pain restricted ROM affecting both sides Stability: No instability detected Muscle Tone/Strength: Functionally intact. No obvious neuro-muscular anomalies detected. Sensory (Neurological): Myotome pain pattern   Gait  & Posture Assessment  Ambulation: Unassisted Gait: Relatively normal for age and body habitus Posture: WNL  Lower Extremity Exam      Side: Right lower extremity   Side: Left lower extremity  Stability: No instability observed           Stability: No instability observed          Skin & Extremity Inspection: Skin color, temperature, and hair growth are WNL. No peripheral edema or cyanosis. No masses, redness, swelling, asymmetry, or associated skin lesions. No contractures.   Skin & Extremity Inspection: Skin color, temperature, and hair growth are WNL. No peripheral edema or cyanosis. No masses, redness, swelling, asymmetry, or associated skin  lesions. No contractures.  Functional ROM: Unrestricted ROM                   Functional ROM: Unrestricted ROM                  Muscle Tone/Strength: Functionally intact. No obvious neuro-muscular anomalies detected.   Muscle Tone/Strength: Functionally intact. No obvious neuro-muscular anomalies detected.  Sensory (Neurological): Unimpaired         Sensory (Neurological): Unimpaired        DTR: Patellar: deferred today Achilles: deferred today Plantar: deferred today   DTR: Patellar: deferred today Achilles: deferred today Plantar: deferred today  Palpation: No palpable anomalies   Palpation: No palpable anomalies      Assessment   Status Diagnosis  Controlled Controlled Controlled 1. Chronic pain syndrome   2. S/P lumbar fusion   3. Chronic right-sided low back pain without sciatica   4. Lumbar degenerative disc disease   5. Long term prescription opiate use   6. Encounter for long-term opiate analgesic use   7. Chronic use of opiate for therapeutic purpose          Plan of Care   Mr. Lymon Kidney. has a current medication list which includes the following long-term medication(s): amlodipine-valsartan-hctz, atorvastatin, linaclotide, metformin, omega-3 acid ethyl esters, sildenafil, hydrochlorothiazide, [START ON  05/14/2021] oxycodone-acetaminophen, [START ON 06/13/2021] oxycodone-acetaminophen, [START ON 07/13/2021] oxycodone-acetaminophen, [START ON 05/14/2021] oxycontin, [START ON 06/13/2021] oxycontin, and [START ON 07/13/2021] oxycontin.  Pharmacotherapy (Medications Ordered): Meds ordered this encounter  Medications   OXYCONTIN 40 MG 12 hr tablet    Sig: Take 1 tablet (40 mg total) by mouth 2 (two) times daily.    Dispense:  60 tablet    Refill:  0    For chronic pain syndrome   OXYCONTIN 40 MG 12 hr tablet    Sig: Take 1 tablet (40 mg total) by mouth 2 (two) times daily.    Dispense:  60 tablet    Refill:  0    For chronic pain syndrome   OXYCONTIN 40 MG 12 hr tablet    Sig: Take 1 tablet (40 mg total) by mouth 2 (two) times daily.    Dispense:  60 tablet    Refill:  0    For chronic pain syndrome   oxyCODONE-acetaminophen (PERCOCET) 10-325 MG tablet    Sig: Take 1 tablet by mouth 2 (two) times daily as needed for pain.    Dispense:  60 tablet    Refill:  0   oxyCODONE-acetaminophen (PERCOCET) 10-325 MG tablet    Sig: Take 1 tablet by mouth 2 (two) times daily as needed for pain.    Dispense:  60 tablet    Refill:  0   oxyCODONE-acetaminophen (PERCOCET) 10-325 MG tablet    Sig: Take 1 tablet by mouth 2 (two) times daily as needed for pain.    Dispense:  60 tablet    Refill:  0   No orders of the defined types were placed in this encounter.    -Continue tizanidine, take as needed -Patient has Narcan at home -Future interventional options could include spinal cord stimulator trial/implant.  Follow-up plan:   Return in about 14 weeks (around 08/05/2021) for Medication Management, in person.   Recent Visits No visits were found meeting these conditions. Showing recent visits within past 90 days and meeting all other requirements Today's Visits Date Type Provider Dept  04/29/21 Office Visit Gillis Santa, MD [facility]  Showing today's visits and meeting all other  requirements Future Appointments No visits were found meeting these conditions. Showing future appointments within next 90 days and meeting all other requirements  I discussed the assessment and treatment plan with the patient. The patient was provided an opportunity to ask questions and all were answered. The patient agreed with the plan and demonstrated an understanding of the instructions.  Patient advised to call back or seek an in-person evaluation if the symptoms or condition worsens.  Duration of encounter: 18mnutes.  Note by: BGillis Santa MD Date: 04/29/2021; Time: 9:53 AM

## 2021-05-02 NOTE — Progress Notes (Signed)
Name: Lawrence Thornton.   MRN: [id number]    DOB: 05-Oct-1955   Date:05/05/2021       Progress Note  Subjective  Chief Complaint  Follow Up  HPI  DMII: diagnosed May 2019 with a A1C was 6.9% he is on Metformin 750 mg daily and A1C has been at goal since last two times at 6.3 %  He denies polydipsia ( but states likes water), no polyphagia, has nocturia from BPH.  He has a history of high triglycerides but doing well on  Lovaza 2 at night because that is how he can tolerate and also Atorvastatin daily. He has Viagra and takes it prn only for ED. HTN is under control and he is on ARB.   Dyslipidemia: he is on Lovaza and Atorvastatin and last LDL was 66 , we will recheck labs today    Chronic pain and opioid use: he was given Linzess and only takes it prn, very expensive .  He states bowel movements are now daily, but states needs to take medication occasionally to help him have a bowel movement, he still has Linzess at home. He is under the care of NP [facility] and Dr. Cherylann Ratel, pain level now is 3/10 right lower back. He is complaint with his visits and takes medication as prescribed. He has a history of back surgery    Morbid obesity:  BMI above 35 He has multiple co-morbidities: DM, HTN, hyperlipidemia. His weight has been stable over the past year.    BPH: Taking Tamsulosin, he states nocturia about twice per night,and is stable at this time    HTN: no chest pain, palpitation or SOB. No side effects of medication BP is at goal today, he states most of the time it has been at goal with the new combination medication. Exforge HCTZ.    Patient Active Problem List   Diagnosis Date Noted   Chronic use of opiate for therapeutic purpose 12/15/2018   Morbid obesity (HCC) 07/28/2018   Lumbar spondylosis 03/15/2018   Long term current use of opiate analgesic 12/15/2017   Dyslipidemia associated with type 2 diabetes mellitus (HCC) 08/13/2017   Osteoarthritis of acromioclavicular joint 07/30/2017    S/P lumbar fusion 07/13/2017   Chronic pain syndrome 07/13/2017   Personal history of colonic polyps    Constipation due to opioid therapy 07/18/2015   Hypertriglyceridemia 01/22/2015   Extreme obesity 01/22/2015   S/P knee replacement 12/21/2014   Chronic right-sided low back pain without sciatica 09/21/2014   BPH associated with nocturia 09/21/2014   Dyslipidemia 09/21/2014   Continuous opioid dependence (HCC) 09/21/2014   Lumbar degenerative disc disease 09/21/2014   Failure of erection 09/21/2014    Past Surgical History:  Procedure Laterality Date   APPENDECTOMY  age 68   arthroscopic shoulder surgery Right 08/31/2017   BACK SURGERY  lower back surgery x 3    L 3 L 4 and L 5 are fused together   COLONOSCOPY WITH PROPOFOL N/A 05/04/2017   Procedure: COLONOSCOPY WITH PROPOFOL;  Surgeon: Midge Minium, MD;  Location: [facility] ENDOSCOPY;  Service: Endoscopy;  Laterality: N/A;   LUMBAR FUSION  1999   L- to L5   metal removed from eye  years ago   sebaceous cyst removed Right 15 years ago   middle  finger    TOE FUSION Right    4th toe   TOTAL KNEE ARTHROPLASTY Left 08/10/2014   Procedure: LEFT TOTAL KNEE ARTHROPLASTY;  Surgeon: Eugenia Mcalpine, MD;  Location: [facility]  ORS;  Service: Orthopedics;  Laterality: Left;   TOTAL KNEE ARTHROPLASTY Right 12/21/2014   Procedure: RIGHT TOTAL KNEE ARTHROPLASTY;  Surgeon: Eugenia Mcalpineobert Collins, MD;  Location: [facility] ORS;  Service: Orthopedics;  Laterality: Right;   TOTAL KNEE ARTHROPLASTY Right 12/21/14    Family History  Problem Relation Age of Onset   Cancer Mother        Throat   Heart attack Mother    Hypertension Father    Hyperlipidemia Father     Social History   Tobacco Use   Smoking status: Former    Packs/day: 1.50    Years: 20.00    Pack years: 30.00    Types: Cigarettes    Quit date: 04/07/1999    Years since quitting: 22.0   Smokeless tobacco: Never   Tobacco comments:    not smoking   Substance Use Topics   Alcohol use: No      Current Outpatient Medications:    aspirin EC 81 MG tablet, Take 1 tablet (81 mg total) by mouth daily., Disp: 30 tablet, Rfl: 0   linaclotide (LINZESS) 145 MCG CAPS capsule, Take 1 capsule (145 mcg total) by mouth daily before breakfast., Disp: 90 capsule, Rfl: 1   naloxone (NARCAN) 2 MG/2ML injection, Inject 1 mL (1 mg total) into the muscle as needed for up to 2 doses (for opioid overdose). Inject content of syringe into thigh muscle. Call 911., Disp: 2 mL, Rfl: 1   [START ON 05/14/2021] oxyCODONE-acetaminophen (PERCOCET) 10-325 MG tablet, Take 1 tablet by mouth 2 (two) times daily as needed for pain., Disp: 60 tablet, Rfl: 0   [START ON 06/13/2021] oxyCODONE-acetaminophen (PERCOCET) 10-325 MG tablet, Take 1 tablet by mouth 2 (two) times daily as needed for pain., Disp: 60 tablet, Rfl: 0   [START ON 07/13/2021] oxyCODONE-acetaminophen (PERCOCET) 10-325 MG tablet, Take 1 tablet by mouth 2 (two) times daily as needed for pain., Disp: 60 tablet, Rfl: 0   [START ON 05/14/2021] OXYCONTIN 40 MG 12 hr tablet, Take 1 tablet (40 mg total) by mouth 2 (two) times daily., Disp: 60 tablet, Rfl: 0   [START ON 06/13/2021] OXYCONTIN 40 MG 12 hr tablet, Take 1 tablet (40 mg total) by mouth 2 (two) times daily., Disp: 60 tablet, Rfl: 0   [START ON 07/13/2021] OXYCONTIN 40 MG 12 hr tablet, Take 1 tablet (40 mg total) by mouth 2 (two) times daily., Disp: 60 tablet, Rfl: 0   sildenafil (VIAGRA) 100 MG tablet, Take 1 tablet (100 mg total) by mouth daily as needed for erectile dysfunction., Disp: 30 tablet, Rfl: 0   tiZANidine (ZANAFLEX) 4 MG tablet, Take 1 tablet (4 mg total) by mouth 2 (two) times daily as needed for muscle spasms., Disp: 60 tablet, Rfl: 5   amLODIPine-Valsartan-HCTZ 10-320-25 MG TABS, One tablet by mouth once daily, Disp: 90 tablet, Rfl: 1   atorvastatin (LIPITOR) 20 MG tablet, Take 1 tablet (20 mg total) by mouth daily., Disp: 90 tablet, Rfl: 1   metFORMIN (GLUCOPHAGE-XR) 750 MG 24 hr tablet, TAKE 1  TABLET(750 MG) BY MOUTH DAILY WITH BREAKFAST, Disp: 90 tablet, Rfl: 1   omega-3 acid ethyl esters (LOVAZA) 1 g capsule, Take 2 capsules (2 g total) by mouth daily., Disp: 180 capsule, Rfl: 1   tamsulosin (FLOMAX) 0.4 MG CAPS capsule, TAKE 1 CAPSULE(0.4 MG) BY MOUTH DAILY, Disp: 90 capsule, Rfl: 0  No Known Allergies  I personally reviewed active problem list, medication list, allergies, family history, social history, health maintenance with the patient/caregiver  today.   ROS  Constitutional: Negative for fever or weight change.  Respiratory: Negative for cough and shortness of breath.   Cardiovascular: Negative for chest pain or palpitations.  Gastrointestinal: Negative for abdominal pain, no bowel changes.  Musculoskeletal: Negative for gait problem or joint swelling.  Skin: Negative for rash.  Neurological: Negative for dizziness or headache.  No other specific complaints in a complete review of systems (except as listed in HPI above).   Objective  Vitals:   05/05/21 0743  BP: 138/78  Pulse: 91  Resp: 16  SpO2: 96%  Weight: 287 lb (130.2 kg)  Height: 6' (1.829 m)    Body mass index is 38.92 kg/m.  Physical Exam  Constitutional: Patient appears well-developed and well-nourished. Obese  No distress.  HEENT: head atraumatic, normocephalic, pupils equal and reactive to light, neck supple Cardiovascular: Normal rate, regular rhythm and normal heart sounds.  No murmur heard. No BLE edema. Pulmonary/Chest: Effort normal and breath sounds normal. No respiratory distress. Abdominal: Soft.  There is no tenderness. Psychiatric: Patient has a normal mood and affect. behavior is normal. Judgment and thought content normal.    Recent Results (from the past 2160 hour(s))  POCT HgB A1C     Status: Abnormal   Collection Time: 05/05/21  7:47 AM  Result Value Ref Range   Hemoglobin A1C 6.3 (A) 4.0 - 5.6 %   HbA1c POC (<> result, manual entry)     HbA1c, POC (prediabetic range)      HbA1c, POC (controlled diabetic range)      Diabetic Foot Exam - Simple   Simple Foot Form Diabetic Foot exam was performed with the following findings: Yes 05/05/2021  8:04 AM  Visual Inspection No deformities, no ulcerations, no other skin breakdown bilaterally: Yes Sensation Testing Intact to touch and monofilament testing bilaterally: Yes Pulse Check Posterior Tibialis and Dorsalis pulse intact bilaterally: Yes Comments      PHQ2/9: Depression screen [facility] 2/9 05/05/2021 11/01/2020 10/03/2020 05/03/2020 02/06/2020  Decreased Interest 0 0 0 0 0  Down, Depressed, Hopeless 0 0 0 0 0  PHQ - 2 Score 0 0 0 0 0  Altered sleeping 0 - - - -  Tired, decreased energy 0 - - - -  Change in appetite 0 - - - -  Feeling bad or failure about yourself  0 - - - -  Trouble concentrating 0 - - - -  Moving slowly or fidgety/restless 0 - - - -  Suicidal thoughts 0 - - - -  PHQ-9 Score 0 - - - -  Difficult doing work/chores - - - - -  Some recent data might be hidden    phq 9 is negative   Fall Risk: Fall Risk  05/05/2021 04/29/2021 01/28/2021 11/01/2020 10/29/2020  Falls in the past year? 0 0 0 0 0  Number falls in past yr: 0 0 0 0 0  Injury with Fall? 0 - - 0 -  Risk for fall due to : No Fall Risks No Fall Risks No Fall Risks - No Fall Risks  Follow up Falls prevention discussed Falls evaluation completed Falls evaluation completed - Falls evaluation completed      Functional Status Survey: Is the patient deaf or have difficulty hearing?: Yes Does the patient have difficulty seeing, even when wearing glasses/contacts?: No Does the patient have difficulty concentrating, remembering, or making decisions?: No Does the patient have difficulty walking or climbing stairs?: No Does the patient have difficulty dressing or bathing?:  No Does the patient have difficulty doing errands alone such as visiting a doctor's office or shopping?: No    Assessment & Plan  1. Dyslipidemia associated with type  2 diabetes mellitus (HCC)  - POCT HgB A1C - HM Diabetes Foot Exam - Lipid panel - atorvastatin (LIPITOR) 20 MG tablet; Take 1 tablet (20 mg total) by mouth daily.  Dispense: 90 tablet; Refill: 1 - metFORMIN (GLUCOPHAGE-XR) 750 MG 24 hr tablet; TAKE 1 TABLET(750 MG) BY MOUTH DAILY WITH BREAKFAST  Dispense: 90 tablet; Refill: 1 - omega-3 acid ethyl esters (LOVAZA) 1 g capsule; Take 2 capsules (2 g total) by mouth daily.  Dispense: 180 capsule; Refill: 1  2. Chronic pain syndrome  Under the care of the pain clinic   3. Morbid obesity (HCC)  Discussed with the patient the risk posed by an increased BMI. Discussed importance of portion control, calorie counting and at least 150 minutes of physical activity weekly. Avoid sweet beverages and drink more water. Eat at least 6 servings of fruit and vegetables daily    4. Hypertension associated with diabetes (HCC)  - Microalbumin / creatinine urine ratio - COMPLETE METABOLIC PANEL WITH GFR - amLODIPine-Valsartan-HCTZ 10-320-25 MG TABS; One tablet by mouth once daily  Dispense: 90 tablet; Refill: 1  5. BPH associated with nocturia  - PSA - tamsulosin (FLOMAX) 0.4 MG CAPS capsule; TAKE 1 CAPSULE(0.4 MG) BY MOUTH DAILY  Dispense: 90 capsule; Refill: 0  6. Continuous opioid dependence (HCC)   7. Constipation due to opioid therapy  Taking Linzess prn only   8. Essential hypertension  - COMPLETE METABOLIC PANEL WITH GFR - amLODIPine-Valsartan-HCTZ 10-320-25 MG TABS; One tablet by mouth once daily  Dispense: 90 tablet; Refill: 1   9. Needs flu shot  - Flu vaccine HIGH DOSE PF (Fluzone High dose)  10. Need for pneumococcal vaccination  - Pneumococcal conjugate vaccine 20-valent (Prevnar 20)

## 2021-05-05 ENCOUNTER — Encounter: Payer: Self-pay | Admitting: Family Medicine

## 2021-05-05 ENCOUNTER — Ambulatory Visit (INDEPENDENT_AMBULATORY_CARE_PROVIDER_SITE_OTHER): Payer: Medicare Other | Admitting: Family Medicine

## 2021-05-05 VITALS — BP 138/78 | HR 91 | Resp 16 | Ht 72.0 in | Wt 287.0 lb

## 2021-05-05 DIAGNOSIS — E1159 Type 2 diabetes mellitus with other circulatory complications: Secondary | ICD-10-CM | POA: Diagnosis not present

## 2021-05-05 DIAGNOSIS — E785 Hyperlipidemia, unspecified: Secondary | ICD-10-CM

## 2021-05-05 DIAGNOSIS — I152 Hypertension secondary to endocrine disorders: Secondary | ICD-10-CM | POA: Diagnosis not present

## 2021-05-05 DIAGNOSIS — Z23 Encounter for immunization: Secondary | ICD-10-CM

## 2021-05-05 DIAGNOSIS — G894 Chronic pain syndrome: Secondary | ICD-10-CM

## 2021-05-05 DIAGNOSIS — E1169 Type 2 diabetes mellitus with other specified complication: Secondary | ICD-10-CM

## 2021-05-05 DIAGNOSIS — K5903 Drug induced constipation: Secondary | ICD-10-CM

## 2021-05-05 DIAGNOSIS — R351 Nocturia: Secondary | ICD-10-CM

## 2021-05-05 DIAGNOSIS — T402X5A Adverse effect of other opioids, initial encounter: Secondary | ICD-10-CM

## 2021-05-05 DIAGNOSIS — F1120 Opioid dependence, uncomplicated: Secondary | ICD-10-CM

## 2021-05-05 DIAGNOSIS — I10 Essential (primary) hypertension: Secondary | ICD-10-CM

## 2021-05-05 DIAGNOSIS — N401 Enlarged prostate with lower urinary tract symptoms: Secondary | ICD-10-CM

## 2021-05-05 LAB — POCT GLYCOSYLATED HEMOGLOBIN (HGB A1C): Hemoglobin A1C: 6.3 % — AB (ref 4.0–5.6)

## 2021-05-05 MED ORDER — METFORMIN HCL ER 750 MG PO TB24: ORAL_TABLET | ORAL | 1 refills | Status: DC

## 2021-05-05 MED ORDER — ATORVASTATIN CALCIUM 20 MG PO TABS: 20.0000 mg | ORAL_TABLET | Freq: Every day | ORAL | 1 refills | Status: DC

## 2021-05-05 MED ORDER — AMLODIPINE-VALSARTAN-HCTZ 10-320-25 MG PO TABS: ORAL_TABLET | ORAL | 1 refills | Status: DC

## 2021-05-05 MED ORDER — TAMSULOSIN HCL 0.4 MG PO CAPS: ORAL_CAPSULE | ORAL | 0 refills | Status: DC

## 2021-05-05 MED ORDER — OMEGA-3-ACID ETHYL ESTERS 1 G PO CAPS: 2.0000 g | ORAL_CAPSULE | Freq: Every day | ORAL | 1 refills | Status: DC

## 2021-05-06 LAB — LIPID PANEL
Cholesterol: 132 mg/dL (ref <200)
HDL: 48 mg/dL (ref >=40)
LDL Cholesterol (Calc): 65 mg/dL (calc)
Non-HDL Cholesterol (Calc): 84 mg/dL (calc) (ref <130)
Total CHOL/HDL Ratio: 2.8 (calc) (ref <5.0)
Triglycerides: 111 mg/dL (ref <150)

## 2021-05-06 LAB — MICROALBUMIN / CREATININE URINE RATIO
Creatinine, Urine: 133 mg/dL (ref 20–320)
Microalb Creat Ratio: 5 mcg/mg creat (ref <30)
Microalb, Ur: 0.6 mg/dL

## 2021-05-06 LAB — COMPLETE METABOLIC PANEL WITH GFR
AG Ratio: 1.7 (calc) (ref 1.0–2.5)
ALT: 44 U/L (ref 9–46)
AST: 33 U/L (ref 10–35)
Albumin: 4.7 g/dL (ref 3.6–5.1)
Alkaline phosphatase (APISO): 71 U/L (ref 35–144)
BUN: 13 mg/dL (ref 7–25)
CO2: 33 mmol/L — ABNORMAL HIGH (ref 20–32)
Calcium: 9.7 mg/dL (ref 8.6–10.3)
Chloride: 100 mmol/L (ref 98–110)
Creat: 0.8 mg/dL (ref 0.70–1.35)
Globulin: 2.7 g/dL (calc) (ref 1.9–3.7)
Glucose, Bld: 127 mg/dL — ABNORMAL HIGH (ref 65–99)
Potassium: 4.3 mmol/L (ref 3.5–5.3)
Sodium: 139 mmol/L (ref 135–146)
Total Bilirubin: 0.3 mg/dL (ref 0.2–1.2)
Total Protein: 7.4 g/dL (ref 6.1–8.1)
eGFR: 98 mL/min/{1.73_m2} (ref >=60)

## 2021-05-06 LAB — PSA: PSA: 0.14 ng/mL (ref <=4.00)

## 2021-05-19 ENCOUNTER — Other Ambulatory Visit: Payer: Self-pay | Admitting: Family Medicine

## 2021-06-18 ENCOUNTER — Other Ambulatory Visit: Payer: Self-pay | Admitting: Student in an Organized Health Care Education/Training Program

## 2021-06-18 DIAGNOSIS — G894 Chronic pain syndrome: Secondary | ICD-10-CM

## 2021-07-29 ENCOUNTER — Encounter: Payer: Self-pay | Admitting: Student in an Organized Health Care Education/Training Program

## 2021-07-29 ENCOUNTER — Ambulatory Visit
Payer: Medicare Other | Attending: Student in an Organized Health Care Education/Training Program | Admitting: Student in an Organized Health Care Education/Training Program

## 2021-07-29 ENCOUNTER — Other Ambulatory Visit: Payer: Self-pay | Admitting: Family Medicine

## 2021-07-29 VITALS — BP 136/76 | HR 79 | Temp 97.3°F | Ht 72.0 in | Wt 274.0 lb

## 2021-07-29 DIAGNOSIS — M545 Low back pain, unspecified: Secondary | ICD-10-CM | POA: Insufficient documentation

## 2021-07-29 DIAGNOSIS — M5136 Other intervertebral disc degeneration, lumbar region: Secondary | ICD-10-CM | POA: Diagnosis not present

## 2021-07-29 DIAGNOSIS — Z79891 Long term (current) use of opiate analgesic: Secondary | ICD-10-CM | POA: Insufficient documentation

## 2021-07-29 DIAGNOSIS — G8929 Other chronic pain: Secondary | ICD-10-CM | POA: Insufficient documentation

## 2021-07-29 DIAGNOSIS — G894 Chronic pain syndrome: Secondary | ICD-10-CM | POA: Diagnosis not present

## 2021-07-29 DIAGNOSIS — Z981 Arthrodesis status: Secondary | ICD-10-CM | POA: Insufficient documentation

## 2021-07-29 DIAGNOSIS — M47816 Spondylosis without myelopathy or radiculopathy, lumbar region: Secondary | ICD-10-CM | POA: Insufficient documentation

## 2021-07-29 DIAGNOSIS — N401 Enlarged prostate with lower urinary tract symptoms: Secondary | ICD-10-CM

## 2021-07-29 MED ORDER — OXYCODONE-ACETAMINOPHEN 10-325 MG PO TABS: 1.0000 | ORAL_TABLET | Freq: Two times a day (BID) | ORAL | 0 refills | Status: DC | PRN

## 2021-07-29 MED ORDER — TIZANIDINE HCL 4 MG PO TABS: 4.0000 mg | ORAL_TABLET | Freq: Two times a day (BID) | ORAL | 5 refills | Status: DC | PRN

## 2021-07-29 MED ORDER — OXYCONTIN 40 MG PO T12A: 40.0000 mg | EXTENDED_RELEASE_TABLET | Freq: Two times a day (BID) | ORAL | 0 refills | Status: DC

## 2021-07-29 NOTE — Progress Notes (Signed)
Nursing Pain Medication Assessment:  ?Safety precautions to be maintained throughout the outpatient stay will include: orient to surroundings, keep bed in low position, maintain call bell within reach at all times, provide assistance with transfer out of bed and ambulation.  ?Medication Inspection Compliance: Pill count conducted under aseptic conditions, in front of the patient. Neither the pills nor the bottle was removed from the patient's sight at any time. Once count was completed pills were immediately returned to the patient in their original bottle. ? ?Medication: Oxycodone ER (OxyContin) ?Pill/Patch Count:  29 of 60 pills remain ?Pill/Patch Appearance: Markings consistent with prescribed medication ?Bottle Appearance: Standard pharmacy container. Clearly labeled. ?Filled Date: 4 / 10 / 2023 ?Last Medication intake:  TodayNursing Pain Medication Assessment:  ? ? ?Medication: Oxycodone/APAP ?Pill/Patch Count:  30 of 60 pills remain ?Pill/Patch Appearance: Markings consistent with prescribed medication ?Bottle Appearance: Standard pharmacy container. Clearly labeled. ?Filled Date: 4 / 10 / 2023 ?Last Medication intake:  TodaySafety precautions to be maintained throughout the outpatient stay will include: orient to surroundings, keep bed in low position, maintain call bell within reach at all times, provide assistance with transfer out of bed and ambulation.  ?

## 2021-07-29 NOTE — Progress Notes (Signed)
PROVIDER NOTE: Information contained herein reflects review and annotations entered in association with encounter. Interpretation of such information and data should be left to medically-trained personnel. Information provided to patient can be located elsewhere in the medical record under "Patient Instructions". Document created using STT-dictation technology, any transcriptional errors that may result from process are unintentional.  ?  ?Patient: Lawrence Thornton.  Service Category: E/M  Provider: Gillis Santa, MD  ?DOB: 08-04-1955  DOS: 07/29/2021  Specialty: Interventional Pain Management  ?MRN: [id number]  Setting: Ambulatory outpatient  PCP: Steele Sizer, MD  ?Type: Established Patient    Referring Provider: Steele Sizer, MD  ?Location: Office  Delivery: Face-to-face    ? ?HPI  ?[name]., a 66 y.o. year old male, is here today because of his Chronic pain syndrome [G89.4]. Mr. [name] primary complain today is Back Pain (lower) ? ?Last encounter: My last encounter with him was on 04/29/2021 ? ?Pertinent problems: Mr. [name] has Chronic right-sided low back pain without sciatica; Continuous opioid dependence ([place]); S/P knee replacement; S/P lumbar fusion; Osteoarthritis of acromioclavicular joint; Long term current use of opiate analgesic; Lumbar spondylosis; Morbid obesity ([place]); and Chronic use of opiate for therapeutic purpose on their pertinent problem list. ?Pain Assessment: Severity of Chronic pain is reported as a 3 /10. Location: Back Left, Right/Denies. Onset: More than a month ago. Quality: Aching, Burning, Constant, Throbbing. Timing: Constant. Modifying factor(s): exercise and meds. ?Vitals:  height is 6' (1.829 m) and weight is 274 lb (124.3 kg). His temperature is 97.3 ?F (36.3 ?C) (abnormal). His blood pressure is 136/76 and his pulse is 79. His oxygen saturation is 98%.  ? ?Reason for encounter: medication management.   ? ?No change in medical history since last  visit.  Patient's pain is at baseline.  Patient continues multimodal pain regimen as prescribed.  States that it provides pain relief and improvement in functional status. ? ? ?Pharmacotherapy Assessment  ? ? ? ?Analgesic: Oxycontin 40 mg BID (MME 120), Percocet 10 mg BID prn(MME 30)  ? ?Monitoring: ?[place] PMP: PDMP reviewed during this encounter.       ?Pharmacotherapy: No side-effects or adverse reactions reported. ?Compliance: No problems identified. ?Effectiveness: Clinically acceptable. ? ?Chauncey [name], RN  07/29/2021  9:18 AM  Sign when Signing Visit ?Nursing Pain Medication Assessment:  ?Safety precautions to be maintained throughout the outpatient stay will include: orient to surroundings, keep bed in low position, maintain call bell within reach at all times, provide assistance with transfer out of bed and ambulation.  ?Medication Inspection Compliance: Pill count conducted under aseptic conditions, in front of the patient. Neither the pills nor the bottle was removed from the patient's sight at any time. Once count was completed pills were immediately returned to the patient in their original bottle. ? ?Medication: Oxycodone ER (OxyContin) ?Pill/Patch Count:  29 of 60 pills remain ?Pill/Patch Appearance: Markings consistent with prescribed medication ?Bottle Appearance: Standard pharmacy container. Clearly labeled. ?Filled Date: 4 / 10 / 2023 ?Last Medication intake:  TodayNursing Pain Medication Assessment:  ? ? ?Medication: Oxycodone/APAP ?Pill/Patch Count:  30 of 60 pills remain ?Pill/Patch Appearance: Markings consistent with prescribed medication ?Bottle Appearance: Standard pharmacy container. Clearly labeled. ?Filled Date: 4 / 10 / 2023 ?Last Medication intake:  TodaySafety precautions to be maintained throughout the outpatient stay will include: orient to surroundings, keep bed in low position, maintain call bell within reach at all times, provide assistance with transfer out of bed and ambulation.   ?  ?  UDS:  ?Summary  ?Date Value Ref Range Status  ?01/28/2021 Note  Final  ?  Comment:  ?  ==================================================================== ?[employer] Select 13 (MW) ?==================================================================== ?Test                             Result       Flag       Units ? ?Drug Present and Declared for Prescription Verification ?  Oxycodone                      3269         EXPECTED   ng/mg creat ?  Oxymorphone                    4557         EXPECTED   ng/mg creat ?  Noroxycodone                   4000         EXPECTED   ng/mg creat ?  Noroxymorphone                 1386         EXPECTED   ng/mg creat ?   Sources of oxycodone are scheduled prescription medications. ?   Oxymorphone, noroxycodone, and noroxymorphone are expected ?   metabolites of oxycodone. Oxymorphone is also available as a ?   scheduled prescription medication. ? ?==================================================================== ?Test                      Result    Flag   Units      Ref Range ?  Creatinine              74               mg/dL      >=20 ?==================================================================== ?Declared Medications: ? The flagging and interpretation on this report are based on the ? following declared medications.  Unexpected results may arise from ? inaccuracies in the declared medications. ? ? **Note: The testing scope of this panel includes these medications: ? ? Oxycodone (Oxycontin) ? Oxycodone (Percocet) ? ? **Note: The testing scope of this panel does not include the ? following reported medications: ? ? Acetaminophen (Percocet) ? Amlodipine ? Aspirin ? Atorvastatin ? Hydrochlorothiazide ? Linaclotide (Linzess) ? Metformin (Glucophage) ? Naloxone (Narcan) ? Omega-3 Fatty Acids ? Sildenafil (Viagra) ? Tamsulosin (Flomax) ? Tizanidine (Zanaflex) ? Valsartan ?==================================================================== ?For clinical consultation, please  call [phone]. ?==================================================================== ?  ?  ? ?ROS  ?Constitutional: Denies any fever or chills ?Gastrointestinal: No reported hemesis, hematochezia, vomiting, or acute GI distress ?Musculoskeletal: Denies any acute onset joint swelling, redness, loss of ROM, or weakness ?Neurological: No reported episodes of acute onset apraxia, aphasia, dysarthria, agnosia, amnesia, paralysis, loss of coordination, or loss of consciousness ? ?Medication Review  ?amLODIPine-Valsartan-HCTZ, aspirin EC, atorvastatin, linaclotide, metFORMIN, naloxone, omega-3 acid ethyl esters, oxyCODONE, oxyCODONE-acetaminophen, sildenafil, tamsulosin, and tiZANidine ? ?History Review  ?Allergy: Mr. Berridge has No Known Allergies. ?Drug: Mr. Monterosso  reports no history of drug use. ?Alcohol:  reports no history of alcohol use. ?Tobacco:  reports that he quit smoking about 22 years ago. His smoking use included cigarettes. He has a 30.00 pack-year smoking history. He has never used smokeless tobacco. ?Social: Mr. Krienke  reports that he quit smoking about 22 years ago. His smoking use included  cigarettes. He has a 30.00 pack-year smoking history. He has never used smokeless tobacco. He reports that he does not drink alcohol and does not use drugs. ?Medical:  has a past medical history of Arthritis and Hypertension. ?Surgical: Mr. Tietjen  has a past surgical history that includes Back surgery (lower back surgery x 3 ); Appendectomy (age 65); sebaceous cyst removed (Right, 15 years ago); Toe Fusion (Right); metal removed from eye (years ago); Total knee arthroplasty (Left, 08/10/2014); Total knee arthroplasty (Right, 12/21/2014); Total knee arthroplasty (Right, 12/21/14); Lumbar fusion (1999); Colonoscopy with propofol (N/A, 05/04/2017); and arthroscopic shoulder surgery (Right, 08/31/2017). ?Family: family history includes Cancer in his mother; Heart attack in his mother; Hyperlipidemia in his  father; Hypertension in his father. ? ?Laboratory Chemistry Profile  ? ?Renal ?Lab Results  ?Component Value Date  ? BUN 13 05/05/2021  ? CREATININE 0.80 05/05/2021  ? LABCREA 133 05/05/2021  ? BCR NOT APPLI

## 2021-08-03 LAB — TOXASSURE SELECT 13 (MW), URINE

## 2021-10-03 NOTE — Progress Notes (Unsigned)
Name: Lawrence Thornton.   MRN: [id number]    DOB: 1955/05/23   Date:10/06/2021       Progress Note  Subjective  Chief Complaint  Annual Exam   HPI  Patient presents for annual CPE.  IPSS Questionnaire (AUA-7): Over the past month.   1)  How often have you had a sensation of not emptying your bladder completely after you finish urinating?  0 - Not at all  2)  How often have you had to urinate again less than two hours after you finished urinating? 0 - Not at all  3)  How often have you found you stopped and started again several times when you urinated?  0 - Not at all  4) How difficult have you found it to postpone urination?  0 - Not at all  5) How often have you had a weak urinary stream?  0 - Not at all  6) How often have you had to push or strain to begin urination?  0 - Not at all  7) How many times did you most typically get up to urinate from the time you went to bed until the time you got up in the morning?  1 - 1 time  Total score:  0-7 mildly symptomatic   8-19 moderately symptomatic   20-35 severely symptomatic     Diet: cutting down on portions, trying to lose weight  Exercise: discussed 150 minutes per week   Depression: phq 9 is negative    10/06/2021    7:51 AM 05/05/2021    7:42 AM 11/01/2020    7:36 AM 10/03/2020    9:07 AM 05/03/2020    7:49 AM  Depression screen PHQ 2/9  Decreased Interest 0 0 0 0 0  Down, Depressed, Hopeless 0 0 0 0 0  PHQ - 2 Score 0 0 0 0 0  Altered sleeping 0 0     Tired, decreased energy 0 0     Change in appetite 0 0     Feeling bad or failure about yourself  0 0     Trouble concentrating 0 0     Moving slowly or fidgety/restless 0 0     Suicidal thoughts 0 0     PHQ-9 Score 0 0       Hypertension:  BP Readings from Last 3 Encounters:  10/06/21 132/78  07/29/21 136/76  05/05/21 138/78    Obesity: Wt Readings from Last 3 Encounters:  10/06/21 278 lb (126.1 kg)  07/29/21 274 lb (124.3 kg)  05/05/21 287 lb (130.2 kg)    BMI Readings from Last 3 Encounters:  10/06/21 37.70 kg/m  07/29/21 37.16 kg/m  05/05/21 38.92 kg/m     Lipids:  Lab Results  Component Value Date   CHOL 132 05/05/2021   CHOL 134 05/03/2020   CHOL 131 08/01/2019   Lab Results  Component Value Date   HDL 48 05/05/2021   HDL 52 05/03/2020   HDL 47 08/01/2019   Lab Results  Component Value Date   LDLCALC 65 05/05/2021   LDLCALC 66 05/03/2020   LDLCALC 63 08/01/2019   Lab Results  Component Value Date   TRIG 111 05/05/2021   TRIG 80 05/03/2020   TRIG 129 08/01/2019   Lab Results  Component Value Date   CHOLHDL 2.8 05/05/2021   CHOLHDL 2.6 05/03/2020   CHOLHDL 2.8 08/01/2019   No results found for: "LDLDIRECT" Glucose:  Glucose, Bld  Date Value Ref Range Status  05/05/2021 127 (H) 65 - 99 mg/dL Final    Comment:    .            Fasting reference interval . For someone without known diabetes, a glucose value >125 mg/dL indicates that they may have diabetes and this should be confirmed with a follow-up test. .   05/03/2020 117 (H) 65 - 99 mg/dL Final    Comment:    .            Fasting reference interval . For someone without known diabetes, a glucose value between 100 and 125 mg/dL is consistent with prediabetes and should be confirmed with a follow-up test. .   08/01/2019 139 (H) 65 - 99 mg/dL Final    Comment:    .            Fasting reference interval . For someone without known diabetes, a glucose value >125 mg/dL indicates that they may have diabetes and this should be confirmed with a follow-up test. .     Flowsheet Row [facility]  AUDIT-C Score 0       Married STD testing and prevention (HIV/chl/gon/syphilis):  N/A Sexual history:  Hep C Screening: 10/06/2016 Skin cancer: Discussed monitoring for atypical lesions Colorectal cancer: 05/04/2017 Prostate cancer:  05/05/21 Lab Results  Component Value Date   PSA 0.14  05/05/2021   PSA 0.17 05/03/2020   PSA 0.2 01/31/2019     Lung cancer:  Low Dose CT Chest recommended if Age 17-80 years, 30 pack-year currently smoking OR have quit w/in 15years. Patient  no a candidate for screening   AAA: The USPSTF recommends one-time screening with ultrasonography in men ages 4865 to 75 years who have ever smoked. Patient   not applicable, a candidate for screening  ECG:  10/10/2015  Vaccines:   HPV: N/A Tdap: 05/07/11, needs booster  Shingrix: up to date Pneumonia: up to date Flu: up to date COVID-19: discussed bivalent booster   Advanced Care Planning: A voluntary discussion about advance care planning including the explanation and discussion of advance directives.  Discussed health care proxy and Living will, and the patient was able to identify a health care proxy as wfie.  Patient has a living will and power of attorney of health care   Patient Active Problem List   Diagnosis Date Noted   Chronic use of opiate for therapeutic purpose 12/15/2018   Morbid obesity (HCC) 07/28/2018   Lumbar spondylosis 03/15/2018   Long term current use of opiate analgesic 12/15/2017   Dyslipidemia associated with type 2 diabetes mellitus (HCC) 08/13/2017   Osteoarthritis of acromioclavicular joint 07/30/2017   S/P lumbar fusion 07/13/2017   Chronic pain syndrome 07/13/2017   Personal history of colonic polyps    Constipation due to opioid therapy 07/18/2015   Hypertriglyceridemia 01/22/2015   Extreme obesity 01/22/2015   S/P knee replacement 12/21/2014   Chronic right-sided low back pain without sciatica 09/21/2014   BPH associated with nocturia 09/21/2014   Dyslipidemia 09/21/2014   Continuous opioid dependence (HCC) 09/21/2014   Lumbar degenerative disc disease 09/21/2014   Failure of erection 09/21/2014    Past Surgical History:  Procedure Laterality Date   APPENDECTOMY  age 66   arthroscopic shoulder surgery Right 08/31/2017   BACK SURGERY  lower back surgery x  3    L 3 L 4 and L 5 are fused together   COLONOSCOPY WITH PROPOFOL N/A 05/04/2017   Procedure: COLONOSCOPY  WITH PROPOFOL;  Surgeon: Midge Minium, MD;  Location: [facility] ENDOSCOPY;  Service: Endoscopy;  Laterality: N/A;   LUMBAR FUSION  1999   L- to L5   metal removed from eye  years ago   sebaceous cyst removed Right 15 years ago   middle  finger    TOE FUSION Right    4th toe   TOTAL KNEE ARTHROPLASTY Left 08/10/2014   Procedure: LEFT TOTAL KNEE ARTHROPLASTY;  Surgeon: Eugenia Mcalpine, MD;  Location: [facility] ORS;  Service: Orthopedics;  Laterality: Left;   TOTAL KNEE ARTHROPLASTY Right 12/21/2014   Procedure: RIGHT TOTAL KNEE ARTHROPLASTY;  Surgeon: Eugenia Mcalpine, MD;  Location: [facility] ORS;  Service: Orthopedics;  Laterality: Right;   TOTAL KNEE ARTHROPLASTY Right 12/21/14    Family History  Problem Relation Age of Onset   Cancer Mother        Throat   Heart attack Mother    Hypertension Father    Hyperlipidemia Father     Social History   Socioeconomic History   Marital status: Married    Spouse name: Darl Pikes   Number of children: 0   Years of education: Not on file   Highest education level: Not on file  Occupational History   Occupation: mill write work   Tobacco Use   Smoking status: Former    Packs/day: 1.50    Years: 20.00    Total pack years: 30.00    Types: Cigarettes    Quit date: 04/07/1999    Years since quitting: 22.5   Smokeless tobacco: Never   Tobacco comments:    not smoking   Vaping Use   Vaping Use: Never used  Substance and Sexual Activity   Alcohol use: No   Drug use: No   Sexual activity: Yes    Birth control/protection: Condom  Other Topics Concern   Not on file  Social History Narrative   Not on file   Social Determinants of Health   Financial Resource Strain: Low Risk  (10/06/2021)   Overall Financial Resource Strain (CARDIA)    Difficulty of Paying Living Expenses: Not hard at all  Food Insecurity: No Food Insecurity (10/06/2021)   Hunger Vital Sign     Worried About Running Out of Food in the Last Year: Never true    Ran Out of Food in the Last Year: Never true  Transportation Needs: No Transportation Needs (10/06/2021)   PRAPARE - Administrator, Civil Service (Medical): No    Lack of Transportation (Non-Medical): No  Physical Activity: Insufficiently Active (10/06/2021)   Exercise Vital Sign    Days of Exercise per Week: 3 days    Minutes of Exercise per Session: 20 min  Stress: No Stress Concern Present (10/06/2021)   [employer] of Occupational Health - Occupational Stress Questionnaire    Feeling of Stress : Only a little  Social Connections: Moderately Isolated (10/06/2021)   Social Connection and Isolation Panel [NHANES]    Frequency of Communication with Friends and Family: More than three times a week    Frequency of Social Gatherings with Friends and Family: Once a week    Attends Religious Services: Never    Database administrator or Organizations: No    Attends Banker Meetings: Never    Marital Status: Married  Catering manager Violence: Not At Risk (10/06/2021)   Humiliation, Afraid, Rape, and Kick questionnaire    Fear of Current or Ex-Partner: No    Emotionally Abused: No  Physically Abused: No    Sexually Abused: No     Current Outpatient Medications:    amLODIPine-Valsartan-HCTZ 10-320-25 MG TABS, One tablet by mouth once daily, Disp: 90 tablet, Rfl: 1   aspirin EC 81 MG tablet, Take 1 tablet (81 mg total) by mouth daily., Disp: 30 tablet, Rfl: 0   atorvastatin (LIPITOR) 20 MG tablet, Take 1 tablet (20 mg total) by mouth daily., Disp: 90 tablet, Rfl: 1   linaclotide (LINZESS) 145 MCG CAPS capsule, Take 1 capsule (145 mcg total) by mouth daily before breakfast., Disp: 90 capsule, Rfl: 1   metFORMIN (GLUCOPHAGE-XR) 750 MG 24 hr tablet, TAKE 1 TABLET(750 MG) BY MOUTH DAILY WITH BREAKFAST, Disp: 90 tablet, Rfl: 1   naloxone (NARCAN) 2 MG/2ML injection, Inject 1 mL (1 mg total) into the  muscle as needed for up to 2 doses (for opioid overdose). Inject content of syringe into thigh muscle. Call 911., Disp: 2 mL, Rfl: 1   omega-3 acid ethyl esters (LOVAZA) 1 g capsule, Take 2 capsules (2 g total) by mouth daily., Disp: 180 capsule, Rfl: 1   oxyCODONE-acetaminophen (PERCOCET) 10-325 MG tablet, Take 1 tablet by mouth 2 (two) times daily as needed for pain., Disp: 60 tablet, Rfl: 0   [START ON 10/12/2021] oxyCODONE-acetaminophen (PERCOCET) 10-325 MG tablet, Take 1 tablet by mouth 2 (two) times daily as needed for pain., Disp: 60 tablet, Rfl: 0   OXYCONTIN 40 MG 12 hr tablet, Take 1 tablet (40 mg total) by mouth 2 (two) times daily., Disp: 60 tablet, Rfl: 0   [START ON 10/12/2021] OXYCONTIN 40 MG 12 hr tablet, Take 1 tablet (40 mg total) by mouth 2 (two) times daily., Disp: 60 tablet, Rfl: 0   sildenafil (VIAGRA) 100 MG tablet, Take 1 tablet (100 mg total) by mouth daily as needed for erectile dysfunction., Disp: 30 tablet, Rfl: 0   tamsulosin (FLOMAX) 0.4 MG CAPS capsule, TAKE 1 CAPSULE(0.4 MG) BY MOUTH DAILY, Disp: 90 capsule, Rfl: 0   tiZANidine (ZANAFLEX) 4 MG tablet, Take 1 tablet (4 mg total) by mouth 2 (two) times daily as needed for muscle spasms., Disp: 60 tablet, Rfl: 5   oxyCODONE-acetaminophen (PERCOCET) 10-325 MG tablet, Take 1 tablet by mouth 2 (two) times daily as needed for pain., Disp: 60 tablet, Rfl: 0   OXYCONTIN 40 MG 12 hr tablet, Take 1 tablet (40 mg total) by mouth 2 (two) times daily., Disp: 60 tablet, Rfl: 0   OXYCONTIN 40 MG 12 hr tablet, Take 1 tablet (40 mg total) by mouth 2 (two) times daily., Disp: 60 tablet, Rfl: 0   OXYCONTIN 40 MG 12 hr tablet, Take 1 tablet (40 mg total) by mouth 2 (two) times daily., Disp: 60 tablet, Rfl: 0  No Known Allergies   ROS  Constitutional: Negative for fever, positive for  weight change.  Respiratory: Negative for cough and shortness of breath.   Cardiovascular: Negative for chest pain or palpitations.  Gastrointestinal:  Negative for abdominal pain, no bowel changes.  Musculoskeletal: Negative for gait problem or joint swelling.  Skin: Negative for rash.  Neurological: Negative for dizziness or headache.  No other specific complaints in a complete review of systems (except as listed in HPI above).    Objective  Vitals:   10/06/21 0751  BP: 132/78  Pulse: 90  Resp: 16  SpO2: 96%  Weight: 278 lb (126.1 kg)  Height: 6' (1.829 m)    Body mass index is 37.7 kg/m.  Physical Exam  Constitutional: Patient  appears well-developed and well-nourished. No distress.  HENT: Head: Normocephalic and atraumatic. Ears: B TMs ok, no erythema or effusion; Nose: Nose normal. Mouth/Throat: Oropharynx is clear and moist. No oropharyngeal exudate.  Eyes: Conjunctivae and EOM are normal. Pupils are equal, round, and reactive to light. No scleral icterus.  Neck: Normal range of motion. Neck supple. No JVD present. No thyromegaly present.  Cardiovascular: Normal rate, regular rhythm and normal heart sounds.  No murmur heard. No BLE edema. Pulmonary/Chest: Effort normal and breath sounds normal. No respiratory distress. Abdominal: Soft. Bowel sounds are normal, no distension. There is no tenderness. no masses MALE GENITALIA: Normal descended testes bilaterally, no masses palpated, no hernias, no lesions, no discharge RECTAL: Prostate enlarged in size,  no rectal masses or hemorrhoids  Musculoskeletal: decrease rom of spine, also signs of arthritis of both hands, enlarged PIP Neurological: he is alert and oriented to person, place, and time. No cranial nerve deficit. Coordination, balance, strength, speech and gait are normal.  Skin: Skin is warm and dry. No rash noted. No erythema.  Psychiatric: Patient has a normal mood and affect. behavior is normal. Judgment and thought content normal.   Recent Results (from the past 2160 hour(s))  ToxASSURE Select 13 (MW), Urine     Status: None   Collection Time: 07/29/21  2:50 PM   Result Value Ref Range   Summary Note     Comment: ==================================================================== ToxASSURE Select 13 (MW) ==================================================================== Test                             Result       Flag       Units  Drug Present and Declared for Prescription Verification   Oxycodone                      3407         EXPECTED   ng/mg creat   Oxymorphone                    3352         EXPECTED   ng/mg creat   Noroxycodone                   3414         EXPECTED   ng/mg creat   Noroxymorphone                 1116         EXPECTED   ng/mg creat    Sources of oxycodone are scheduled prescription medications.    Oxymorphone, noroxycodone, and noroxymorphone are expected    metabolites of oxycodone. Oxymorphone is also available as a    scheduled prescription medication.  ==================================================================== Test                      Result    Flag   Units      Ref Range   Creatinine              182              mg/dL      >=63 == ================================================================== Declared Medications:  The flagging and interpretation on this report are based on the  following declared medications.  Unexpected results may arise from  inaccuracies in the declared medications.   **Note: The testing scope of this panel includes these medications:  Oxycodone (Oxycontin)  Oxycodone (Percocet)   **Note: The testing scope of this panel does not include the  following reported medications:   Acetaminophen (Percocet)  Amlodipine  Aspirin  Atorvastatin (Lipitor)  Hydrochlorothiazide  Linaclotide (Linzess)  Metformin (Glucophage)  Naloxone (Narcan)  Omega-3 Fatty Acids  Sildenafil (Viagra)  Tamsulosin (Flomax)  Tizanidine (Zanaflex)  Valsartan ==================================================================== For clinical consultation, please call [phone]. ====================================================================      Fall Risk:    10/06/2021    7:50 AM 07/29/2021    9:26 AM 05/05/2021    7:42 AM 04/29/2021    9:33 AM 01/28/2021    9:24 AM  Fall Risk   Falls in the past year? 0 0 0 0 0  Number falls in past yr: 0  0 0 0  Injury with Fall? 0  0    Risk for fall due to : No Fall Risks  No Fall Risks No Fall Risks No Fall Risks  Follow up Falls prevention discussed  Falls prevention discussed Falls evaluation completed Falls evaluation completed     Functional Status Survey: Is the patient deaf or have difficulty hearing?: Yes Does the patient have difficulty seeing, even when wearing glasses/contacts?: Yes Does the patient have difficulty concentrating, remembering, or making decisions?: No Does the patient have difficulty walking or climbing stairs?: No Does the patient have difficulty dressing or bathing?: No Does the patient have difficulty doing errands alone such as visiting a doctor's office or shopping?: No    Assessment & Plan   1. Well adult exam   2. Need for Tdap vaccination  - Tdap (ADACEL) 08-05-13.5 LF-MCG/0.5 injection; Inject 0.5 mLs into the muscle once for 1 dose.  Dispense: 0.5 mL; Refill: 0   -Prostate cancer screening and PSA options (with potential risks and benefits of testing vs not testing) were discussed along with recent recs/guidelines. -USPSTF grade A and B recommendations reviewed with patient; age-appropriate recommendations, preventive care, screening tests, etc discussed and encouraged; healthy living encouraged; see AVS for patient education given to patient -Discussed importance of 150 minutes of physical activity weekly, eat two servings of fish weekly, eat one serving of tree nuts ( cashews, pistachios, pecans, almonds.Marland Kitchen) every other day, eat 6 servings of fruit/vegetables daily and drink plenty of water and avoid sweet beverages.  -Reviewed Health Maintenance: yes

## 2021-10-03 NOTE — Patient Instructions (Signed)
Preventive Care 65 Years and Older, Male Preventive care refers to lifestyle choices and visits with your health care provider that can promote health and wellness. Preventive care visits are also called wellness exams. What can I expect for my preventive care visit? Counseling During your preventive care visit, your health care provider may ask about your: Medical history, including: Past medical problems. Family medical history. History of falls. Current health, including: Emotional well-being. Home life and relationship well-being. Sexual activity. Memory and ability to understand (cognition). Lifestyle, including: Alcohol, nicotine or tobacco, and drug use. Access to firearms. Diet, exercise, and sleep habits. Work and work environment. Sunscreen use. Safety issues such as seatbelt and bike helmet use. Physical exam Your health care provider will check your: Height and weight. These may be used to calculate your BMI (body mass index). BMI is a measurement that tells if you are at a healthy weight. Waist circumference. This measures the distance around your waistline. This measurement also tells if you are at a healthy weight and may help predict your risk of certain diseases, such as type 2 diabetes and high blood pressure. Heart rate and blood pressure. Body temperature. Skin for abnormal spots. What immunizations do I need?  Vaccines are usually given at various ages, according to a schedule. Your health care provider will recommend vaccines for you based on your age, medical history, and lifestyle or other factors, such as travel or where you work. What tests do I need? Screening Your health care provider may recommend screening tests for certain conditions. This may include: Lipid and cholesterol levels. Diabetes screening. This is done by checking your blood sugar (glucose) after you have not eaten for a while (fasting). Hepatitis C test. Hepatitis B test. HIV (human  immunodeficiency virus) test. STI (sexually transmitted infection) testing, if you are at risk. Lung cancer screening. Colorectal cancer screening. Prostate cancer screening. Abdominal aortic aneurysm (AAA) screening. You may need this if you are a current or former smoker. Talk with your health care provider about your test results, treatment options, and if necessary, the need for more tests. Follow these instructions at home: Eating and drinking  Eat a diet that includes fresh fruits and vegetables, whole grains, lean protein, and low-fat dairy products. Limit your intake of foods with high amounts of sugar, saturated fats, and salt. Take vitamin and mineral supplements as recommended by your health care provider. Do not drink alcohol if your health care provider tells you not to drink. If you drink alcohol: Limit how much you have to 0-2 drinks a day. Know how much alcohol is in your drink. In the [place] one drink equals one 12 oz bottle of beer (355 mL), one 5 oz glass of wine (148 mL), or one 1 oz glass of hard liquor (44 mL). Lifestyle Brush your teeth every morning and night with fluoride toothpaste. Floss one time each day. Exercise for at least 30 minutes 5 or more days each week. Do not use any products that contain nicotine or tobacco. These products include cigarettes, chewing tobacco, and vaping devices, such as e-cigarettes. If you need help quitting, ask your health care provider. Do not use drugs. If you are sexually active, practice safe sex. Use a condom or other form of protection to prevent STIs. Take aspirin only as told by your health care provider. Make sure that you understand how much to take and what form to take. Work with your health care provider to find out whether it is safe   and beneficial for you to take aspirin daily. Ask your health care provider if you need to take a cholesterol-lowering medicine (statin). Find healthy ways to manage stress, such  as: Meditation, yoga, or listening to music. Journaling. Talking to a trusted person. Spending time with friends and family. Safety Always wear your seat belt while driving or riding in a vehicle. Do not drive: If you have been drinking alcohol. Do not ride with someone who has been drinking. When you are tired or distracted. While texting. If you have been using any mind-altering substances or drugs. Wear a helmet and other protective equipment during sports activities. If you have firearms in your house, make sure you follow all gun safety procedures. Minimize exposure to UV radiation to reduce your risk of skin cancer. What's next? Visit your health care provider once a year for an annual wellness visit. Ask your health care provider how often you should have your eyes and teeth checked. Stay up to date on all vaccines. This information is not intended to replace advice given to you by your health care provider. Make sure you discuss any questions you have with your health care provider. Document Revised: 09/18/2020 Document Reviewed: 09/18/2020 Elsevier Patient Education  2023 [employer]  

## 2021-10-06 ENCOUNTER — Ambulatory Visit (INDEPENDENT_AMBULATORY_CARE_PROVIDER_SITE_OTHER): Payer: Medicare Other | Admitting: Family Medicine

## 2021-10-06 ENCOUNTER — Encounter: Payer: Self-pay | Admitting: Family Medicine

## 2021-10-06 VITALS — BP 132/78 | HR 90 | Resp 16 | Ht 72.0 in | Wt 278.0 lb

## 2021-10-06 DIAGNOSIS — Z0000 Encounter for general adult medical examination without abnormal findings: Secondary | ICD-10-CM

## 2021-10-06 DIAGNOSIS — Z23 Encounter for immunization: Secondary | ICD-10-CM | POA: Diagnosis not present

## 2021-10-06 MED ORDER — TETANUS-DIPHTH-ACELL PERTUSSIS 5-2-15.5 LF-MCG/0.5 IM SUSP: 0.5000 mL | Freq: Once | INTRAMUSCULAR | 0 refills | Status: AC

## 2021-10-17 DIAGNOSIS — Z7984 Long term (current) use of oral hypoglycemic drugs: Secondary | ICD-10-CM | POA: Diagnosis not present

## 2021-10-17 DIAGNOSIS — H5203 Hypermetropia, bilateral: Secondary | ICD-10-CM | POA: Diagnosis not present

## 2021-10-17 DIAGNOSIS — H2513 Age-related nuclear cataract, bilateral: Secondary | ICD-10-CM | POA: Diagnosis not present

## 2021-10-17 DIAGNOSIS — E119 Type 2 diabetes mellitus without complications: Secondary | ICD-10-CM | POA: Diagnosis not present

## 2021-10-30 ENCOUNTER — Ambulatory Visit
Payer: Medicare Other | Attending: Student in an Organized Health Care Education/Training Program | Admitting: Student in an Organized Health Care Education/Training Program

## 2021-10-30 ENCOUNTER — Encounter: Payer: Self-pay | Admitting: Student in an Organized Health Care Education/Training Program

## 2021-10-30 VITALS — BP 170/72 | HR 77 | Temp 98.1°F | Resp 18 | Ht 72.0 in | Wt 272.0 lb

## 2021-10-30 DIAGNOSIS — M5136 Other intervertebral disc degeneration, lumbar region: Secondary | ICD-10-CM | POA: Insufficient documentation

## 2021-10-30 DIAGNOSIS — Z79891 Long term (current) use of opiate analgesic: Secondary | ICD-10-CM | POA: Diagnosis not present

## 2021-10-30 DIAGNOSIS — Z981 Arthrodesis status: Secondary | ICD-10-CM | POA: Diagnosis not present

## 2021-10-30 DIAGNOSIS — M545 Low back pain, unspecified: Secondary | ICD-10-CM | POA: Insufficient documentation

## 2021-10-30 DIAGNOSIS — G894 Chronic pain syndrome: Secondary | ICD-10-CM | POA: Insufficient documentation

## 2021-10-30 DIAGNOSIS — G8929 Other chronic pain: Secondary | ICD-10-CM | POA: Diagnosis not present

## 2021-10-30 MED ORDER — OXYCODONE-ACETAMINOPHEN 10-325 MG PO TABS: 1.0000 | ORAL_TABLET | Freq: Two times a day (BID) | ORAL | 0 refills | Status: DC | PRN

## 2021-10-30 MED ORDER — OXYCONTIN 40 MG PO T12A: 40.0000 mg | EXTENDED_RELEASE_TABLET | Freq: Two times a day (BID) | ORAL | 0 refills | Status: DC

## 2021-10-30 NOTE — Progress Notes (Signed)
Name: Lawrence Thornton.   MRN: [id number]    DOB: April 04, 1956   Date:10/31/2021       Progress Note  Subjective  Chief Complaint  Follow Up  HPI  DMII: diagnosed May 2019 with a A1C was 6.9% he was  on Metformin 750 mg daily but stopped one week ago - he states he feels better without it, he asked about Ozempic for DM, his  A1C today 6.4 % He denies polydipsia ( but states likes water), no polyphagia, has nocturia from BPH.  He is taking Lovaza two daily instead of four daily  and Atorvastatin He has Viagra and takes it prn only for ED. HTN is under control and he is on ARB and last urine micro was negative.  Dyslipidemia: he is on Lovaza and Atorvastatin and last LDL was 65 ,continue current regiment    Chronic pain and opioid use: he was given Linzess and only takes it prn, very expensive .  He states bowel movements are now daily, but states needs to take medication occasionally to help him have a bowel movement, he still has Linzess at home. He is under the care of NP [facility] and Dr. Cherylann Ratel, pain level now is 3/10 right lower back. He is complaint with his visits and takes medication as prescribed. He has a history of back surgery    Morbid obesity:  BMI above 35 He has multiple co-morbidities: DM, HTN, hyperlipidemia. We will try Ozempic for DM and help with weight loss  BPH: Taking Tamsulosin, he states nocturia about twice per night,and is stable at this time Last PSA was normal January 2023    HTN: no chest pain, palpitation or SOB. No side effects of medication BP is at goal today,  continue current regiment. He has been playing golf   Excoriation skin: he works on cars, welds, and has excoriation on right hand, he asked for Tdap, states pharmacy keeps re-scheduling and he needs it, he is aware it may be costly and insurance may not cover it here.   Patient Active Problem List   Diagnosis Date Noted   Chronic use of opiate for therapeutic purpose 12/15/2018   Morbid obesity  (HCC) 07/28/2018   Lumbar spondylosis 03/15/2018   Long term current use of opiate analgesic 12/15/2017   Dyslipidemia associated with type 2 diabetes mellitus (HCC) 08/13/2017   Osteoarthritis of acromioclavicular joint 07/30/2017   S/P lumbar fusion 07/13/2017   Chronic pain syndrome 07/13/2017   Personal history of colonic polyps    Constipation due to opioid therapy 07/18/2015   Hypertriglyceridemia 01/22/2015   S/P knee replacement 12/21/2014   Chronic right-sided low back pain without sciatica 09/21/2014   BPH associated with nocturia 09/21/2014   Dyslipidemia 09/21/2014   Continuous opioid dependence (HCC) 09/21/2014   Lumbar degenerative disc disease 09/21/2014   Failure of erection 09/21/2014    Past Surgical History:  Procedure Laterality Date   APPENDECTOMY  age 48   arthroscopic shoulder surgery Right 08/31/2017   BACK SURGERY  lower back surgery x 3    L 3 L 4 and L 5 are fused together   COLONOSCOPY WITH PROPOFOL N/A 05/04/2017   Procedure: COLONOSCOPY WITH PROPOFOL;  Surgeon: Midge Minium, MD;  Location: [facility] ENDOSCOPY;  Service: Endoscopy;  Laterality: N/A;   LUMBAR FUSION  1999   L- to L5   metal removed from eye  years ago   sebaceous cyst removed Right 15 years ago   middle  finger  TOE FUSION Right    4th toe   TOTAL KNEE ARTHROPLASTY Left 08/10/2014   Procedure: LEFT TOTAL KNEE ARTHROPLASTY;  Surgeon: Eugenia Mcalpine, MD;  Location: [facility] ORS;  Service: Orthopedics;  Laterality: Left;   TOTAL KNEE ARTHROPLASTY Right 12/21/2014   Procedure: RIGHT TOTAL KNEE ARTHROPLASTY;  Surgeon: Eugenia Mcalpine, MD;  Location: [facility] ORS;  Service: Orthopedics;  Laterality: Right;   TOTAL KNEE ARTHROPLASTY Right 12/21/14    Family History  Problem Relation Age of Onset   Cancer Mother        Throat   Heart attack Mother    Hypertension Father    Hyperlipidemia Father     Social History   Tobacco Use   Smoking status: Former    Packs/day: 1.50    Years: 20.00    Total  pack years: 30.00    Types: Cigarettes    Quit date: 04/07/1999    Years since quitting: 22.5   Smokeless tobacco: Never   Tobacco comments:    not smoking   Substance Use Topics   Alcohol use: No     Current Outpatient Medications:    amLODIPine-Valsartan-HCTZ 10-320-25 MG TABS, One tablet by mouth once daily, Disp: 90 tablet, Rfl: 1   aspirin EC 81 MG tablet, Take 1 tablet (81 mg total) by mouth daily., Disp: 30 tablet, Rfl: 0   atorvastatin (LIPITOR) 20 MG tablet, Take 1 tablet (20 mg total) by mouth daily., Disp: 90 tablet, Rfl: 1   linaclotide (LINZESS) 145 MCG CAPS capsule, Take 1 capsule (145 mcg total) by mouth daily before breakfast., Disp: 90 capsule, Rfl: 1   metFORMIN (GLUCOPHAGE-XR) 750 MG 24 hr tablet, TAKE 1 TABLET(750 MG) BY MOUTH DAILY WITH BREAKFAST, Disp: 90 tablet, Rfl: 1   naloxone (NARCAN) 2 MG/2ML injection, Inject 1 mL (1 mg total) into the muscle as needed for up to 2 doses (for opioid overdose). Inject content of syringe into thigh muscle. Call 911., Disp: 2 mL, Rfl: 1   omega-3 acid ethyl esters (LOVAZA) 1 g capsule, Take 2 capsules (2 g total) by mouth daily., Disp: 180 capsule, Rfl: 1   [START ON 11/12/2021] oxyCODONE-acetaminophen (PERCOCET) 10-325 MG tablet, Take 1 tablet by mouth 2 (two) times daily as needed for pain., Disp: 60 tablet, Rfl: 0   [START ON 12/12/2021] oxyCODONE-acetaminophen (PERCOCET) 10-325 MG tablet, Take 1 tablet by mouth 2 (two) times daily as needed for pain., Disp: 60 tablet, Rfl: 0   [START ON 01/11/2022] oxyCODONE-acetaminophen (PERCOCET) 10-325 MG tablet, Take 1 tablet by mouth 2 (two) times daily as needed for pain., Disp: 60 tablet, Rfl: 0   [START ON 11/12/2021] OXYCONTIN 40 MG 12 hr tablet, Take 1 tablet (40 mg total) by mouth 2 (two) times daily., Disp: 60 tablet, Rfl: 0   [START ON 12/12/2021] OXYCONTIN 40 MG 12 hr tablet, Take 1 tablet (40 mg total) by mouth 2 (two) times daily., Disp: 60 tablet, Rfl: 0   [START ON 01/11/2022] OXYCONTIN 40  MG 12 hr tablet, Take 1 tablet (40 mg total) by mouth 2 (two) times daily., Disp: 60 tablet, Rfl: 0   sildenafil (VIAGRA) 100 MG tablet, Take 1 tablet (100 mg total) by mouth daily as needed for erectile dysfunction., Disp: 30 tablet, Rfl: 0   tamsulosin (FLOMAX) 0.4 MG CAPS capsule, TAKE 1 CAPSULE(0.4 MG) BY MOUTH DAILY, Disp: 90 capsule, Rfl: 0   tiZANidine (ZANAFLEX) 4 MG tablet, Take 1 tablet (4 mg total) by mouth 2 (two) times daily as needed  for muscle spasms., Disp: 60 tablet, Rfl: 5  No Known Allergies  I personally reviewed active problem list, medication list, allergies, family history, social history, health maintenance with the patient/caregiver today.   ROS  Constitutional: Negative for fever or weight change.  Respiratory: Negative for cough and shortness of breath.   Cardiovascular: Negative for chest pain or palpitations.  Gastrointestinal: Negative for abdominal pain, no bowel changes.  Musculoskeletal: Negative for gait problem or joint swelling.  Skin: excoriation on right hand   Neurological: Negative for dizziness or headache.  No other specific complaints in a complete review of systems (except as listed in HPI above).   Objective  Vitals:   10/31/21 0743  BP: 130/68  Pulse: 73  Resp: 16  SpO2: 96%  Weight: 279 lb (126.6 kg)  Height: 6' (1.829 m)    Body mass index is 37.84 kg/m.  Physical Exam  Constitutional: Patient appears well-developed and well-nourished. Obese  No distress.  HEENT: head atraumatic, normocephalic, pupils equal and reactive to light,, neck supple Cardiovascular: Normal rate, regular rhythm and normal heart sounds.  No murmur heard. No BLE edema. Pulmonary/Chest: Effort normal and breath sounds normal. No respiratory distress. Abdominal: Soft.  There is no tenderness. Skin: excoriation on right hand between index finger and thumb  Psychiatric: Patient has a normal mood and affect. behavior is normal. Judgment and thought content  normal.   Recent Results (from the past 2160 hour(s))  POCT HgB A1C     Status: Abnormal   Collection Time: 10/31/21  7:44 AM  Result Value Ref Range   Hemoglobin A1C 6.4 (A) 4.0 - 5.6 %   HbA1c POC (<> result, manual entry)     HbA1c, POC (prediabetic range)     HbA1c, POC (controlled diabetic range)        PHQ2/9:    10/31/2021    7:43 AM 10/30/2021    9:17 AM 10/06/2021    7:51 AM 05/05/2021    7:42 AM 11/01/2020    7:36 AM  Depression screen PHQ 2/9  Decreased Interest 0 0 0 0 0  Down, Depressed, Hopeless 0 0 0 0 0  PHQ - 2 Score 0 0 0 0 0  Altered sleeping 0  0 0   Tired, decreased energy 0  0 0   Change in appetite 0  0 0   Feeling bad or failure about yourself  0  0 0   Trouble concentrating 0  0 0   Moving slowly or fidgety/restless 0  0 0   Suicidal thoughts 0  0 0   PHQ-9 Score 0  0 0     phq 9 is negative   Fall Risk:    10/31/2021    7:43 AM 10/30/2021    9:17 AM 10/06/2021    7:50 AM 07/29/2021    9:26 AM 05/05/2021    7:42 AM  Fall Risk   Falls in the past year? 0 0 0 0 0  Number falls in past yr: 0  0  0  Injury with Fall? 0  0  0  Risk for fall due to : No Fall Risks  No Fall Risks  No Fall Risks  Follow up Falls prevention discussed  Falls prevention discussed  Falls prevention discussed      Functional Status Survey: Is the patient deaf or have difficulty hearing?: Yes Does the patient have difficulty seeing, even when wearing glasses/contacts?: No Does the patient have difficulty concentrating, remembering, or making  decisions?: No Does the patient have difficulty walking or climbing stairs?: No Does the patient have difficulty dressing or bathing?: No Does the patient have difficulty doing errands alone such as visiting a doctor's office or shopping?: No    Assessment & Plan  1. Dyslipidemia associated with type 2 diabetes mellitus (HCC)  - POCT HgB A1C - atorvastatin (LIPITOR) 20 MG tablet; Take 1 tablet (20 mg total) by mouth daily.   Dispense: 90 tablet; Refill: 1 - omega-3 acid ethyl esters (LOVAZA) 1 g capsule; Take 2 capsules (2 g total) by mouth daily.  Dispense: 180 capsule; Refill: 1  2. Hypertension associated with diabetes (HCC)  - amLODIPine-Valsartan-HCTZ 10-320-25 MG TABS; One tablet by mouth once daily  Dispense: 90 tablet; Refill: 1  3. Continuous opioid dependence (HCC)  Under the care of pain clinic   4. Morbid obesity (HCC)  Discussed with the patient the risk posed by an increased BMI. Discussed importance of portion control, calorie counting and at least 150 minutes of physical activity weekly. Avoid sweet beverages and drink more water. Eat at least 6 servings of fruit and vegetables daily    5. Excoriation  - Tdap vaccine greater than or equal to 7yo IM  6. Chronic pain syndrome   7. Constipation due to opioid therapy   8. BPH associated with nocturia  - tamsulosin (FLOMAX) 0.4 MG CAPS capsule; Take 1 capsule (0.4 mg total) by mouth daily after supper.  Dispense: 90 capsule; Refill: 0  9. Essential hypertension  - amLODIPine-Valsartan-HCTZ 10-320-25 MG TABS; One tablet by mouth once daily  Dispense: 90 tablet; Refill: 1  10. Need for Tdap vaccination  - Tdap vaccine greater than or equal to 7yo IM

## 2021-10-30 NOTE — Progress Notes (Signed)
Nursing Pain Medication Assessment:  Safety precautions to be maintained throughout the outpatient stay will include: orient to surroundings, keep bed in low position, maintain call bell within reach at all times, provide assistance with transfer out of bed and ambulation.  Medication Inspection Compliance: Pill count conducted under aseptic conditions, in front of the patient. Neither the pills nor the bottle was removed from the patient's sight at any time. Once count was completed pills were immediately returned to the patient in their original bottle.  Medication: Oxycodone/APAP Pill/Patch Count:  24 of 60 pills remain Pill/Patch Appearance: Markings consistent with prescribed medication Bottle Appearance: Standard pharmacy container. Clearly labeled. Filled Date: 07 / 10 / 2023 Last Medication intake:  Yesterday  Oxycontin 40 mg 24/60 Filled 10-13-2021 yesterday

## 2021-10-30 NOTE — Progress Notes (Signed)
PROVIDER NOTE: Information contained herein reflects review and annotations entered in association with encounter. Interpretation of such information and data should be left to medically-trained personnel. Information provided to patient can be located elsewhere in the medical record under "Patient Instructions". Document created using STT-dictation technology, any transcriptional errors that may result from process are unintentional.    Patient: Lawrence Thornton.  Service Category: E/M  Provider: Gillis Santa, MD  DOB: [birth year]  DOS: 10/30/2021  Specialty: Interventional Pain Management  MRN: [id number]  Setting: Ambulatory outpatient  PCP: Steele Sizer, MD  Type: Established Patient    Referring Provider: Steele Sizer, MD  Location: Office  Delivery: Face-to-face     HPI  Mr. Glennie Rodda., a 66 y.o. year old male, is here today because of his Chronic pain syndrome [G89.4]. Mr. Zawadzki primary complain today is Back Pain (Lower right)  Last encounter: My last encounter with him was on 04/29/2021  Pertinent problems: Mr. Hensley has Chronic right-sided low back pain without sciatica; Continuous opioid dependence ([place]); S/P knee replacement; S/P lumbar fusion; Osteoarthritis of acromioclavicular joint; Long term current use of opiate analgesic; Lumbar spondylosis; Morbid obesity ([place]); and Chronic use of opiate for therapeutic purpose on their pertinent problem list. Pain Assessment: Severity of Chronic pain is reported as a 3 /10. Location: Back Lower, Right/denies. Onset: More than a month ago. Quality: Larence Penning. Timing: Intermittent. Modifying factor(s): meds. Vitals:  height is 6' (1.829 m) and weight is 272 lb (123.4 kg). His temperature is 98.1 F (36.7 C). His blood pressure is 170/72 (abnormal) and his pulse is 77. His respiration is 18 and oxygen saturation is 100%.   Reason for encounter: medication management.    No change in medical history since last  visit.  Patient's pain is at baseline.  Patient continues multimodal pain regimen as prescribed.  States that it provides pain relief and improvement in functional status.   Pharmacotherapy Assessment     Analgesic: Oxycontin 40 mg BID (MME 120), Percocet 10 mg BID prn(MME 30)   Monitoring: [place] PMP: PDMP reviewed during this encounter.       Pharmacotherapy: No side-effects or adverse reactions reported. Compliance: No problems identified. Effectiveness: Clinically acceptable.  Dewayne Shorter, RN  10/30/2021  9:18 AM  Sign when Signing Visit Nursing Pain Medication Assessment:  Safety precautions to be maintained throughout the outpatient stay will include: orient to surroundings, keep bed in low position, maintain call bell within reach at all times, provide assistance with transfer out of bed and ambulation.  Medication Inspection Compliance: Pill count conducted under aseptic conditions, in front of the patient. Neither the pills nor the bottle was removed from the patient's sight at any time. Once count was completed pills were immediately returned to the patient in their original bottle.  Medication: Oxycodone/APAP Pill/Patch Count:  24 of 60 pills remain Pill/Patch Appearance: Markings consistent with prescribed medication Bottle Appearance: Standard pharmacy container. Clearly labeled. Filled Date: 07 / 10 / 2023 Last Medication intake:  Yesterday  Oxycontin 40 mg 24/60 Filled 10-13-2021 yesterday   UDS:  Summary  Date Value Ref Range Status  07/29/2021 Note  Final    Comment:    ==================================================================== [facility] (MW) ==================================================================== Test                             Result       Flag       Units  Drug Present and Declared for Prescription Verification   Oxycodone                      3407         EXPECTED   ng/mg creat   Oxymorphone                    3352          EXPECTED   ng/mg creat   Noroxycodone                   3414         EXPECTED   ng/mg creat   Noroxymorphone                 1116         EXPECTED   ng/mg creat    Sources of oxycodone are scheduled prescription medications.    Oxymorphone, noroxycodone, and noroxymorphone are expected    metabolites of oxycodone. Oxymorphone is also available as a    scheduled prescription medication.  ==================================================================== Test                      Result    Flag   Units      Ref Range   Creatinine              182              mg/dL      >=20 ==================================================================== Declared Medications:  The flagging and interpretation on this report are based on the  following declared medications.  Unexpected results may arise from  inaccuracies in the declared medications.   **Note: The testing scope of this panel includes these medications:   Oxycodone (Oxycontin)  Oxycodone (Percocet)   **Note: The testing scope of this panel does not include the  following reported medications:   Acetaminophen (Percocet)  Amlodipine  Aspirin  Atorvastatin (Lipitor)  Hydrochlorothiazide  Linaclotide (Linzess)  Metformin (Glucophage)  Naloxone (Narcan)  Omega-3 Fatty Acids  Sildenafil (Viagra)  Tamsulosin (Flomax)  Tizanidine (Zanaflex)  Valsartan ==================================================================== For clinical consultation, please call [phone]. ====================================================================      ROS  Constitutional: Denies any fever or chills Gastrointestinal: No reported hemesis, hematochezia, vomiting, or acute GI distress Musculoskeletal: Denies any acute onset joint swelling, redness, loss of ROM, or weakness Neurological: No reported episodes of acute onset apraxia, aphasia, dysarthria, agnosia, amnesia, paralysis, loss of coordination, or loss of  consciousness  Medication Review  amLODIPine-Valsartan-HCTZ, aspirin EC, atorvastatin, linaclotide, metFORMIN, naloxone, omega-3 acid ethyl esters, oxyCODONE, oxyCODONE-acetaminophen, sildenafil, tamsulosin, and tiZANidine  History Review  Allergy: Mr. Sluder has No Known Allergies. Drug: Mr. Fancher  reports no history of drug use. Alcohol:  reports no history of alcohol use. Tobacco:  reports that he quit smoking about 22 years ago. His smoking use included cigarettes. He has a 30.00 pack-year smoking history. He has never used smokeless tobacco. Social: Mr. Corbo  reports that he quit smoking about 22 years ago. His smoking use included cigarettes. He has a 30.00 pack-year smoking history. He has never used smokeless tobacco. He reports that he does not drink alcohol and does not use drugs. Medical:  has a past medical history of Arthritis and Hypertension. Surgical: Mr. Buttery  has a past surgical history that includes Back surgery (lower back surgery x 3 ); Appendectomy (age 60); sebaceous cyst removed (Right, 15 years ago); Toe Fusion (Right); metal  removed from eye (years ago); Total knee arthroplasty (Left, 08/10/2014); Total knee arthroplasty (Right, 12/21/2014); Total knee arthroplasty (Right, 12/21/14); Lumbar fusion (1999); Colonoscopy with propofol (N/A, 05/04/2017); and arthroscopic shoulder surgery (Right, 08/31/2017). Family: family history includes Cancer in his mother; Heart attack in his mother; Hyperlipidemia in his father; Hypertension in his father.  Laboratory Chemistry Profile   Renal Lab Results  Component Value Date   BUN 13 05/05/2021   CREATININE 0.80 05/05/2021   LABCREA 133 17/35/6701   BCR NOT APPLICABLE 41/06/129   GFRAA 108 05/03/2020   GFRNONAA 93 05/03/2020     Hepatic Lab Results  Component Value Date   AST 33 05/05/2021   ALT 44 05/05/2021   ALBUMIN 4.6 11/11/2015   ALKPHOS 76 11/11/2015   HCVAB NEGATIVE 10/06/2016     Electrolytes Lab  Results  Component Value Date   NA 139 05/05/2021   K 4.3 05/05/2021   CL 100 05/05/2021   CALCIUM 9.7 05/05/2021     Bone Lab Results  Component Value Date   VD25OH 43 10/06/2016     Inflammation (CRP: Acute Phase) (ESR: Chronic Phase) No results found for: "CRP", "ESRSEDRATE", "LATICACIDVEN"     Note: Above Lab results reviewed.   Physical Exam  General appearance: Well nourished, well developed, and well hydrated. In no apparent acute distress Mental status: Alert, oriented x 3 (person, place, & time)       Respiratory: No evidence of acute respiratory distress Eyes: PERLA Vitals: BP (!) 170/72   Pulse 77   Temp 98.1 F (36.7 C)   Resp 18   Ht 6' (1.829 m)   Wt 272 lb (123.4 kg)   SpO2 100%   BMI 36.89 kg/m  BMI: Estimated body mass index is 36.89 kg/m as calculated from the following:   Height as of this encounter: 6' (1.829 m).   Weight as of this encounter: 272 lb (123.4 kg). Ideal: Ideal body weight: 77.6 kg (171 lb 1.2 oz) Adjusted ideal body weight: 95.9 kg (211 lb 7.1 oz)     Lumbar Spine Area Exam  Skin & Axial Inspection: Well healed scar from previous spine surgery detected Alignment: Symmetrical Functional ROM: Pain restricted ROM affecting both sides Stability: No instability detected Muscle Tone/Strength: Functionally intact. No obvious neuro-muscular anomalies detected. Sensory (Neurological): Myotome pain pattern   Gait & Posture Assessment  Ambulation: Unassisted Gait: Relatively normal for age and body habitus Posture: WNL  Lower Extremity Exam      Side: Right lower extremity   Side: Left lower extremity  Stability: No instability observed           Stability: No instability observed          Skin & Extremity Inspection: Skin color, temperature, and hair growth are WNL. No peripheral edema or cyanosis. No masses, redness, swelling, asymmetry, or associated skin lesions. No contractures.   Skin & Extremity Inspection: Skin color,  temperature, and hair growth are WNL. No peripheral edema or cyanosis. No masses, redness, swelling, asymmetry, or associated skin lesions. No contractures.  Functional ROM: Unrestricted ROM                   Functional ROM: Unrestricted ROM                  Muscle Tone/Strength: Functionally intact. No obvious neuro-muscular anomalies detected.   Muscle Tone/Strength: Functionally intact. No obvious neuro-muscular anomalies detected.  Sensory (Neurological): Unimpaired  Sensory (Neurological): Unimpaired        DTR: Patellar: deferred today Achilles: deferred today Plantar: deferred today   DTR: Patellar: deferred today Achilles: deferred today Plantar: deferred today  Palpation: No palpable anomalies   Palpation: No palpable anomalies      Assessment   Status Diagnosis  Controlled Controlled Controlled 1. Chronic pain syndrome   2. S/P lumbar fusion   3. Chronic right-sided low back pain without sciatica   4. Lumbar degenerative disc disease   5. Long term prescription opiate use   6. Encounter for long-term opiate analgesic use   7. Chronic use of opiate for therapeutic purpose           Plan of Care   Mr. Jebediah Macrae. has a current medication list which includes the following long-term medication(s): amlodipine-valsartan-hctz, atorvastatin, linaclotide, metformin, omega-3 acid ethyl esters, sildenafil, [START ON 11/12/2021] oxycodone-acetaminophen, [START ON 12/12/2021] oxycodone-acetaminophen, [START ON 01/11/2022] oxycodone-acetaminophen, [START ON 11/12/2021] oxycontin, [START ON 12/12/2021] oxycontin, and [START ON 01/11/2022] oxycontin.  Pharmacotherapy (Medications Ordered): Meds ordered this encounter  Medications   OXYCONTIN 40 MG 12 hr tablet    Sig: Take 1 tablet (40 mg total) by mouth 2 (two) times daily.    Dispense:  60 tablet    Refill:  0    For chronic pain syndrome   OXYCONTIN 40 MG 12 hr tablet    Sig: Take 1 tablet (40 mg total) by mouth  2 (two) times daily.    Dispense:  60 tablet    Refill:  0    For chronic pain syndrome   OXYCONTIN 40 MG 12 hr tablet    Sig: Take 1 tablet (40 mg total) by mouth 2 (two) times daily.    Dispense:  60 tablet    Refill:  0    For chronic pain syndrome   oxyCODONE-acetaminophen (PERCOCET) 10-325 MG tablet    Sig: Take 1 tablet by mouth 2 (two) times daily as needed for pain.    Dispense:  60 tablet    Refill:  0   oxyCODONE-acetaminophen (PERCOCET) 10-325 MG tablet    Sig: Take 1 tablet by mouth 2 (two) times daily as needed for pain.    Dispense:  60 tablet    Refill:  0   oxyCODONE-acetaminophen (PERCOCET) 10-325 MG tablet    Sig: Take 1 tablet by mouth 2 (two) times daily as needed for pain.    Dispense:  60 tablet    Refill:  0   No orders of the defined types were placed in this encounter.    -Patient has Narcan at home -Future interventional options could include spinal cord stimulator trial/implant.  Follow-up plan:   Return in about 3 months (around 01/30/2022) for Medication Management, in person.   Recent Visits No visits were found meeting these conditions. Showing recent visits within past 90 days and meeting all other requirements Today's Visits Date Type Provider Dept  10/30/21 Office Visit Gillis Santa, MD [facility]  Showing today's visits and meeting all other requirements Future Appointments Date Type Provider Dept  01/27/22 Appointment Gillis Santa, MD [facility]  Showing future appointments within next 90 days and meeting all other requirements  I discussed the assessment and treatment plan with the patient. The patient was provided an opportunity to ask questions and all were answered. The patient agreed with the plan and demonstrated an understanding of the instructions.  Patient advised to call back or seek an in-person  evaluation if the symptoms or condition worsens.  Duration of encounter: 58mnutes.  Note by: BGillis Santa MD Date: 10/30/2021; Time: 9:56 AM

## 2021-10-31 ENCOUNTER — Encounter: Payer: Self-pay | Admitting: Family Medicine

## 2021-10-31 ENCOUNTER — Ambulatory Visit (INDEPENDENT_AMBULATORY_CARE_PROVIDER_SITE_OTHER): Payer: Medicare Other | Admitting: Family Medicine

## 2021-10-31 VITALS — BP 130/68 | HR 73 | Resp 16 | Ht 72.0 in | Wt 279.0 lb

## 2021-10-31 DIAGNOSIS — E1169 Type 2 diabetes mellitus with other specified complication: Secondary | ICD-10-CM | POA: Diagnosis not present

## 2021-10-31 DIAGNOSIS — E785 Hyperlipidemia, unspecified: Secondary | ICD-10-CM

## 2021-10-31 DIAGNOSIS — Z23 Encounter for immunization: Secondary | ICD-10-CM | POA: Diagnosis not present

## 2021-10-31 DIAGNOSIS — T402X5A Adverse effect of other opioids, initial encounter: Secondary | ICD-10-CM

## 2021-10-31 DIAGNOSIS — G894 Chronic pain syndrome: Secondary | ICD-10-CM

## 2021-10-31 DIAGNOSIS — E1159 Type 2 diabetes mellitus with other circulatory complications: Secondary | ICD-10-CM | POA: Diagnosis not present

## 2021-10-31 DIAGNOSIS — F1120 Opioid dependence, uncomplicated: Secondary | ICD-10-CM

## 2021-10-31 DIAGNOSIS — T148XXA Other injury of unspecified body region, initial encounter: Secondary | ICD-10-CM | POA: Diagnosis not present

## 2021-10-31 DIAGNOSIS — I152 Hypertension secondary to endocrine disorders: Secondary | ICD-10-CM

## 2021-10-31 DIAGNOSIS — R351 Nocturia: Secondary | ICD-10-CM

## 2021-10-31 DIAGNOSIS — N401 Enlarged prostate with lower urinary tract symptoms: Secondary | ICD-10-CM

## 2021-10-31 DIAGNOSIS — K5903 Drug induced constipation: Secondary | ICD-10-CM

## 2021-10-31 DIAGNOSIS — I10 Essential (primary) hypertension: Secondary | ICD-10-CM

## 2021-10-31 LAB — POCT GLYCOSYLATED HEMOGLOBIN (HGB A1C): Hemoglobin A1C: 6.4 % — AB (ref 4.0–5.6)

## 2021-10-31 MED ORDER — AMLODIPINE-VALSARTAN-HCTZ 10-320-25 MG PO TABS: ORAL_TABLET | ORAL | 1 refills | Status: DC

## 2021-10-31 MED ORDER — OMEGA-3-ACID ETHYL ESTERS 1 G PO CAPS: 2.0000 g | ORAL_CAPSULE | Freq: Every day | ORAL | 1 refills | Status: DC

## 2021-10-31 MED ORDER — ATORVASTATIN CALCIUM 20 MG PO TABS: 20.0000 mg | ORAL_TABLET | Freq: Every day | ORAL | 1 refills | Status: DC

## 2021-10-31 MED ORDER — TAMSULOSIN HCL 0.4 MG PO CAPS: 0.4000 mg | ORAL_CAPSULE | Freq: Every day | ORAL | 0 refills | Status: DC

## 2021-10-31 MED ORDER — SEMAGLUTIDE (1 MG/DOSE) 4 MG/3ML ~~LOC~~ SOPN: 1.0000 mg | PEN_INJECTOR | SUBCUTANEOUS | 1 refills | Status: DC

## 2021-11-02 ENCOUNTER — Other Ambulatory Visit: Payer: Self-pay | Admitting: Family Medicine

## 2021-11-02 DIAGNOSIS — E1169 Type 2 diabetes mellitus with other specified complication: Secondary | ICD-10-CM

## 2021-11-03 ENCOUNTER — Other Ambulatory Visit: Payer: Self-pay | Admitting: Family Medicine

## 2021-11-03 DIAGNOSIS — I10 Essential (primary) hypertension: Secondary | ICD-10-CM

## 2021-11-03 DIAGNOSIS — I152 Hypertension secondary to endocrine disorders: Secondary | ICD-10-CM

## 2021-11-11 LAB — HM DIABETES EYE EXAM

## 2022-01-21 ENCOUNTER — Other Ambulatory Visit: Payer: Self-pay

## 2022-01-21 DIAGNOSIS — T402X5A Adverse effect of other opioids, initial encounter: Secondary | ICD-10-CM

## 2022-01-21 MED ORDER — LINACLOTIDE 145 MCG PO CAPS: 145.0000 ug | ORAL_CAPSULE | Freq: Every day | ORAL | 0 refills | Status: DC

## 2022-01-27 ENCOUNTER — Encounter: Payer: Self-pay | Admitting: Student in an Organized Health Care Education/Training Program

## 2022-01-27 ENCOUNTER — Ambulatory Visit
Payer: Medicare Other | Attending: Student in an Organized Health Care Education/Training Program | Admitting: Student in an Organized Health Care Education/Training Program

## 2022-01-27 VITALS — BP 140/72 | HR 75 | Temp 97.5°F | Ht 72.0 in | Wt 265.0 lb

## 2022-01-27 DIAGNOSIS — G894 Chronic pain syndrome: Secondary | ICD-10-CM

## 2022-01-27 DIAGNOSIS — M47816 Spondylosis without myelopathy or radiculopathy, lumbar region: Secondary | ICD-10-CM | POA: Diagnosis not present

## 2022-01-27 DIAGNOSIS — G8929 Other chronic pain: Secondary | ICD-10-CM

## 2022-01-27 DIAGNOSIS — M545 Low back pain, unspecified: Secondary | ICD-10-CM

## 2022-01-27 DIAGNOSIS — Z981 Arthrodesis status: Secondary | ICD-10-CM

## 2022-01-27 DIAGNOSIS — Z79891 Long term (current) use of opiate analgesic: Secondary | ICD-10-CM | POA: Diagnosis not present

## 2022-01-27 DIAGNOSIS — M5136 Other intervertebral disc degeneration, lumbar region: Secondary | ICD-10-CM

## 2022-01-27 MED ORDER — OXYCODONE-ACETAMINOPHEN 10-325 MG PO TABS: 1.0000 | ORAL_TABLET | Freq: Two times a day (BID) | ORAL | 0 refills | Status: DC | PRN

## 2022-01-27 MED ORDER — OXYCODONE HCL ER 20 MG PO T12A: 20.0000 mg | EXTENDED_RELEASE_TABLET | Freq: Two times a day (BID) | ORAL | 0 refills | Status: DC

## 2022-01-27 MED ORDER — OXYCONTIN 40 MG PO T12A: 40.0000 mg | EXTENDED_RELEASE_TABLET | Freq: Two times a day (BID) | ORAL | 0 refills | Status: DC

## 2022-01-27 NOTE — Progress Notes (Signed)
PROVIDER NOTE: Information contained herein reflects review and annotations entered in association with encounter. Interpretation of such information and data should be left to medically-trained personnel. Information provided to patient can be located elsewhere in the medical record under "Patient Instructions". Document created using STT-dictation technology, any transcriptional errors that may result from process are unintentional.    Patient: Lawrence Thornton.  Service Category: E/M  Provider: Gillis Santa, MD  DOB: 1955-04-28  DOS: 01/27/2022  Specialty: Interventional Pain Management  MRN: [id number]  Setting: Ambulatory outpatient  PCP: Steele Sizer, MD  Type: Established Patient    Referring Provider: Steele Sizer, MD  Location: Office  Delivery: Face-to-face     HPI  [name]., a 66 y.o. year old male, is here today because of his Chronic pain syndrome [G89.4]. [name] primary complain today is Back Pain (Lower right back)  Last encounter: My last encounter with him was on 10/30/2021  Pertinent problems: [name] has Chronic right-sided low back pain without sciatica; Continuous opioid dependence ([place]); S/P knee replacement; S/P lumbar fusion; Osteoarthritis of acromioclavicular joint; Long term current use of opiate analgesic; Lumbar spondylosis; Morbid obesity ([place]); and Chronic use of opiate for therapeutic purpose on their pertinent problem list. Pain Assessment: Severity of Chronic pain is reported as a 3 /10. Location: Back Right/denies. Onset: More than a month ago. Quality: Constant, Dull, Sharp. Timing: Constant. Modifying factor(s): meds. Vitals:  height is 6' (1.829 m) and weight is 265 lb (120.2 kg). His temporal temperature is 97.5 F (36.4 C) (abnormal). His blood pressure is 140/72 (abnormal) and his pulse is 75. His oxygen saturation is 98%.   Reason for encounter: medication management.    No change in medical history since last  visit.  Patient's pain is at baseline.  Patient continues multimodal pain regimen as prescribed.  States that it provides pain relief and improvement in functional status. We had a discussion today regarding weaning his opioid analgesics.  I have expressed this in the past as well.  Our goal is to have lowest effective dose possible but tried to not exceed 90 MME. Patient states that he has tried various therapies in the past and is not interested in repeat injections.  We discussed spinal cord stimulation for pain management in patients with chronic low back pain related to lumbar spinal fusion.  He I recommend decreasing his OxyContin to 20 mg twice daily.   Pharmacotherapy Assessment     Analgesic: Oxycontin 40 mg BID (MME 120), Percocet 10 mg BID prn(MME 30)   Monitoring: [place] PMP: PDMP reviewed during this encounter.       Pharmacotherapy: No side-effects or adverse reactions reported. Compliance: No problems identified. Effectiveness: Clinically acceptable.  Arlice Colt, RN  01/27/2022  8:23 AM  Sign when Signing Visit Nursing Pain Medication Assessment:  Medication #1: Hydrocodone/APAP Pill/Patch Count:  29 of 60 pills remain Bottle Appearance: Standard pharmacy container. Clearly labeled. Filled Date: 10 / 09 / 2023 Last Medication intake:  Today  Medication #2: Oxycodone ER (OxyContin) Pill/Patch Count:  29 of 60 pills remain Bottle Appearance: Standard pharmacy container. Clearly labeled. Filled Date: 10 / 09 / 2023 Last Medication intake:  Today   Date: 01/27/22; Time: 8:20 AMSafety precautions to be maintained throughout the outpatient stay will include: orient to surroundings, keep bed in low position, maintain call bell within reach at all times, provide assistance with transfer out of bed and ambulation.      UDS:  Summary  Date Value Ref Range Status  07/29/2021 Note  Final    Comment:     ==================================================================== [facility] (MW) ==================================================================== Test                             Result       Flag       Units  Drug Present and Declared for Prescription Verification   Oxycodone                      3407         EXPECTED   ng/mg creat   Oxymorphone                    3352         EXPECTED   ng/mg creat   Noroxycodone                   3414         EXPECTED   ng/mg creat   Noroxymorphone                 1116         EXPECTED   ng/mg creat    Sources of oxycodone are scheduled prescription medications.    Oxymorphone, noroxycodone, and noroxymorphone are expected    metabolites of oxycodone. Oxymorphone is also available as a    scheduled prescription medication.  ==================================================================== Test                      Result    Flag   Units      Ref Range   Creatinine              182              mg/dL      >=20 ==================================================================== Declared Medications:  The flagging and interpretation on this report are based on the  following declared medications.  Unexpected results may arise from  inaccuracies in the declared medications.   **Note: The testing scope of this panel includes these medications:   Oxycodone (Oxycontin)  Oxycodone (Percocet)   **Note: The testing scope of this panel does not include the  following reported medications:   Acetaminophen (Percocet)  Amlodipine  Aspirin  Atorvastatin (Lipitor)  Hydrochlorothiazide  Linaclotide (Linzess)  Metformin (Glucophage)  Naloxone (Narcan)  Omega-3 Fatty Acids  Sildenafil (Viagra)  Tamsulosin (Flomax)  Tizanidine (Zanaflex)  Valsartan ==================================================================== For clinical consultation, please call [phone]. ====================================================================      ROS  Constitutional: Denies any fever or chills Gastrointestinal: No reported hemesis, hematochezia, vomiting, or acute GI distress Musculoskeletal:  Positive low back pain Neurological: No reported episodes of acute onset apraxia, aphasia, dysarthria, agnosia, amnesia, paralysis, loss of coordination, or loss of consciousness  Medication Review  Semaglutide (1 MG/DOSE), amLODIPine-Valsartan-HCTZ, aspirin EC, atorvastatin, linaclotide, naloxone, omega-3 acid ethyl esters, oxyCODONE, oxyCODONE-acetaminophen, sildenafil, tamsulosin, and tiZANidine  History Review  Allergy: [name] has No Known Allergies. Drug: [name]  reports no history of drug use. Alcohol:  reports no history of alcohol use. Tobacco:  reports that he quit smoking about 22 years ago. His smoking use included cigarettes. He has a 30.00 pack-year smoking history. He has never used smokeless tobacco. Social: [name]  reports that he quit smoking about 22 years ago. His smoking use included cigarettes. He has a 30.00 pack-year  smoking history. He has never used smokeless tobacco. He reports that he does not drink alcohol and does not use drugs. Medical:  has a past medical history of Arthritis and Hypertension. Surgical: [name]  has a past surgical history that includes Back surgery (lower back surgery x 3 ); Appendectomy (age 65); sebaceous cyst removed (Right, 15 years ago); Toe Fusion (Right); metal removed from eye (years ago); Total knee arthroplasty (Left, 08/10/2014); Total knee arthroplasty (Right, 12/21/2014); Total knee arthroplasty (Right, 12/21/14); Lumbar fusion (1999); Colonoscopy with propofol (N/A, 05/04/2017); and arthroscopic shoulder surgery (Right, 08/31/2017). Family: family history includes Cancer in his mother; Heart attack in his mother; Hyperlipidemia in his father; Hypertension in his father.  Laboratory  Chemistry Profile   Renal Lab Results  Component Value Date   BUN 13 05/05/2021   CREATININE 0.80 05/05/2021   LABCREA 133 84/13/2440   BCR NOT APPLICABLE 01/31/2535   GFRAA 108 05/03/2020   GFRNONAA 93 05/03/2020     Hepatic Lab Results  Component Value Date   AST 33 05/05/2021   ALT 44 05/05/2021   ALBUMIN 4.6 11/11/2015   ALKPHOS 76 11/11/2015   HCVAB NEGATIVE 10/06/2016     Electrolytes Lab Results  Component Value Date   NA 139 05/05/2021   K 4.3 05/05/2021   CL 100 05/05/2021   CALCIUM 9.7 05/05/2021     Bone Lab Results  Component Value Date   VD25OH 43 10/06/2016     Inflammation (CRP: Acute Phase) (ESR: Chronic Phase) No results found for: "CRP", "ESRSEDRATE", "LATICACIDVEN"     Note: Above Lab results reviewed.   Physical Exam  General appearance: Well nourished, well developed, and well hydrated. In no apparent acute distress Mental status: Alert, oriented x 3 (person, place, & time)       Respiratory: No evidence of acute respiratory distress Eyes: PERLA Vitals: BP (!) 140/72 (BP Location: Left Arm, Patient Position: Sitting)   Pulse 75   Temp (!) 97.5 F (36.4 C) (Temporal)   Ht 6' (1.829 m)   Wt 265 lb (120.2 kg)   SpO2 98%   BMI 35.94 kg/m  BMI: Estimated body mass index is 35.94 kg/m as calculated from the following:   Height as of this encounter: 6' (1.829 m).   Weight as of this encounter: 265 lb (120.2 kg). Ideal: Ideal body weight: 77.6 kg (171 lb 1.2 oz) Adjusted ideal body weight: 94.6 kg (208 lb 10.3 oz)     Lumbar Spine Area Exam  Skin & Axial Inspection: Well healed scar from previous spine surgery detected Alignment: Symmetrical Functional ROM: Pain restricted ROM affecting both sides Stability: No instability detected Muscle Tone/Strength: Functionally intact. No obvious neuro-muscular anomalies detected. Sensory (Neurological): Myotome pain pattern   Gait & Posture Assessment  Ambulation: Unassisted Gait:  Relatively normal for age and body habitus Posture: WNL  Lower Extremity Exam      Side: Right lower extremity   Side: Left lower extremity  Stability: No instability observed           Stability: No instability observed          Skin & Extremity Inspection: Skin color, temperature, and hair growth are WNL. No peripheral edema or cyanosis. No masses, redness, swelling, asymmetry, or associated skin lesions. No contractures.   Skin & Extremity Inspection: Skin color, temperature, and hair growth are WNL. No peripheral edema or cyanosis. No masses, redness, swelling, asymmetry, or associated skin lesions. No contractures.  Functional ROM: Unrestricted ROM  Functional ROM: Unrestricted ROM                  Muscle Tone/Strength: Functionally intact. No obvious neuro-muscular anomalies detected.   Muscle Tone/Strength: Functionally intact. No obvious neuro-muscular anomalies detected.  Sensory (Neurological): Unimpaired         Sensory (Neurological): Unimpaired        DTR: Patellar: deferred today Achilles: deferred today Plantar: deferred today   DTR: Patellar: deferred today Achilles: deferred today Plantar: deferred today  Palpation: No palpable anomalies   Palpation: No palpable anomalies      Assessment   Status Diagnosis  Controlled Controlled Controlled 1. Chronic pain syndrome   2. S/P lumbar fusion   3. Chronic right-sided low back pain without sciatica   4. Lumbar degenerative disc disease   5. Long term prescription opiate use   6. Encounter for long-term opiate analgesic use   7. Chronic use of opiate for therapeutic purpose   8. Lumbar spondylosis           Plan of Care   [name]. has a current medication list which includes the following long-term medication(s): amlodipine-valsartan-hctz, atorvastatin, linaclotide, omega-3 acid ethyl esters, [START ON 04/12/2022] oxycodone, oxycodone-acetaminophen, oxycodone-acetaminophen,  oxycontin, oxycontin, sildenafil, [START ON 02/11/2022] oxycodone-acetaminophen, [START ON 03/13/2022] oxycodone-acetaminophen, [START ON 04/12/2022] oxycodone-acetaminophen, [START ON 02/11/2022] oxycontin, and [START ON 03/13/2022] oxycontin.  Pharmacotherapy (Medications Ordered): Meds ordered this encounter  Medications   OXYCONTIN 40 MG 12 hr tablet    Sig: Take 1 tablet (40 mg total) by mouth 2 (two) times daily.    Dispense:  60 tablet    Refill:  0    For chronic pain syndrome   OXYCONTIN 40 MG 12 hr tablet    Sig: Take 1 tablet (40 mg total) by mouth 2 (two) times daily.    Dispense:  60 tablet    Refill:  0    For chronic pain syndrome   oxyCODONE (OXYCONTIN) 20 mg 12 hr tablet    Sig: Take 1 tablet (20 mg total) by mouth every 12 (twelve) hours. Months last 30 days.    Dispense:  60 tablet    Refill:  0    Chronic Pain: STOP Act (Not applicable) Fill 1 day early if closed on refill date. Avoid benzodiazepines within 8 hours of opioids   oxyCODONE-acetaminophen (PERCOCET) 10-325 MG tablet    Sig: Take 1 tablet by mouth 2 (two) times daily as needed for pain.    Dispense:  60 tablet    Refill:  0   oxyCODONE-acetaminophen (PERCOCET) 10-325 MG tablet    Sig: Take 1 tablet by mouth 2 (two) times daily as needed for pain.    Dispense:  60 tablet    Refill:  0   oxyCODONE-acetaminophen (PERCOCET) 10-325 MG tablet    Sig: Take 1 tablet by mouth 2 (two) times daily as needed for pain.    Dispense:  60 tablet    Refill:  0   No orders of the defined types were placed in this encounter.    -Patient has Narcan at home -Future interventional options could include spinal cord stimulator trial/implant.  Follow-up plan:   Return in about 3 months (around 04/30/2022) for Medication Management, in person.   Recent Visits Date Type Provider Dept  10/30/21 Office Visit Gillis Santa, MD [facility]  Showing recent visits within past 90 days and meeting all other  requirements Today's Visits Date Type Provider Dept  01/27/22 Office Visit Gillis Santa, MD [facility]  Showing today's visits and meeting all other requirements Future Appointments Date Type Provider Dept  04/21/22 Appointment Gillis Santa, MD [facility]  Showing future appointments within next 90 days and meeting all other requirements  I discussed the assessment and treatment plan with the patient. The patient was provided an opportunity to ask questions and all were answered. The patient agreed with the plan and demonstrated an understanding of the instructions.  Patient advised to call back or seek an in-person evaluation if the symptoms or condition worsens.  Duration of encounter: 23mnutes.  Note by: BGillis Santa MD Date: 01/27/2022; Time: 9:14 AM

## 2022-01-27 NOTE — Progress Notes (Signed)
Nursing Pain Medication Assessment:  Medication #1: Hydrocodone/APAP Pill/Patch Count:  29 of 60 pills remain Bottle Appearance: Standard pharmacy container. Clearly labeled. Filled Date: 10 / 09 / 2023 Last Medication intake:  Today  Medication #2: Oxycodone ER (OxyContin) Pill/Patch Count:  29 of 60 pills remain Bottle Appearance: Standard pharmacy container. Clearly labeled. Filled Date: 10 / 09 / 2023 Last Medication intake:  Today   Date: 01/27/22; Time: 8:20 AMSafety precautions to be maintained throughout the outpatient stay will include: orient to surroundings, keep bed in low position, maintain call bell within reach at all times, provide assistance with transfer out of bed and ambulation.

## 2022-02-09 ENCOUNTER — Other Ambulatory Visit: Payer: Self-pay | Admitting: Family Medicine

## 2022-02-09 DIAGNOSIS — N401 Enlarged prostate with lower urinary tract symptoms: Secondary | ICD-10-CM

## 2022-02-10 ENCOUNTER — Other Ambulatory Visit: Payer: Self-pay | Admitting: Family Medicine

## 2022-02-10 DIAGNOSIS — N401 Enlarged prostate with lower urinary tract symptoms: Secondary | ICD-10-CM

## 2022-02-22 ENCOUNTER — Other Ambulatory Visit: Payer: Self-pay | Admitting: Student in an Organized Health Care Education/Training Program

## 2022-02-22 DIAGNOSIS — G894 Chronic pain syndrome: Secondary | ICD-10-CM

## 2022-03-04 ENCOUNTER — Other Ambulatory Visit: Payer: Self-pay

## 2022-03-04 MED ORDER — SEMAGLUTIDE (1 MG/DOSE) 4 MG/3ML ~~LOC~~ SOPN: 1.0000 mg | PEN_INJECTOR | SUBCUTANEOUS | 0 refills | Status: DC

## 2022-04-08 DIAGNOSIS — K080 Exfoliation of teeth due to systemic causes: Secondary | ICD-10-CM | POA: Diagnosis not present

## 2022-04-21 ENCOUNTER — Ambulatory Visit
Payer: Medicare Other | Attending: Student in an Organized Health Care Education/Training Program | Admitting: Student in an Organized Health Care Education/Training Program

## 2022-04-21 ENCOUNTER — Encounter: Payer: Self-pay | Admitting: Student in an Organized Health Care Education/Training Program

## 2022-04-21 VITALS — BP 159/81 | HR 98 | Temp 97.9°F | Ht 72.0 in | Wt 250.0 lb

## 2022-04-21 DIAGNOSIS — M5136 Other intervertebral disc degeneration, lumbar region: Secondary | ICD-10-CM | POA: Insufficient documentation

## 2022-04-21 DIAGNOSIS — G8929 Other chronic pain: Secondary | ICD-10-CM | POA: Diagnosis not present

## 2022-04-21 DIAGNOSIS — M545 Low back pain, unspecified: Secondary | ICD-10-CM | POA: Diagnosis not present

## 2022-04-21 DIAGNOSIS — M5410 Radiculopathy, site unspecified: Secondary | ICD-10-CM | POA: Diagnosis not present

## 2022-04-21 DIAGNOSIS — G894 Chronic pain syndrome: Secondary | ICD-10-CM | POA: Insufficient documentation

## 2022-04-21 DIAGNOSIS — Z981 Arthrodesis status: Secondary | ICD-10-CM | POA: Insufficient documentation

## 2022-04-21 DIAGNOSIS — Z79891 Long term (current) use of opiate analgesic: Secondary | ICD-10-CM | POA: Diagnosis not present

## 2022-04-21 MED ORDER — TIZANIDINE HCL 4 MG PO TABS: 4.0000 mg | ORAL_TABLET | Freq: Two times a day (BID) | ORAL | 5 refills | Status: DC | PRN

## 2022-04-21 MED ORDER — OXYCODONE-ACETAMINOPHEN 10-325 MG PO TABS: 1.0000 | ORAL_TABLET | Freq: Two times a day (BID) | ORAL | 0 refills | Status: DC | PRN

## 2022-04-21 MED ORDER — OXYCODONE HCL ER 20 MG PO T12A: 20.0000 mg | EXTENDED_RELEASE_TABLET | Freq: Two times a day (BID) | ORAL | 0 refills | Status: AC

## 2022-04-21 MED ORDER — OXYCODONE HCL ER 20 MG PO T12A: 20.0000 mg | EXTENDED_RELEASE_TABLET | Freq: Two times a day (BID) | ORAL | 0 refills | Status: DC

## 2022-04-21 NOTE — Progress Notes (Signed)
Nursing Pain Medication Assessment:  Safety precautions to be maintained throughout the outpatient stay will include: orient to surroundings, keep bed in low position, maintain call bell within reach at all times, provide assistance with transfer out of bed and ambulation.   Nursing Pain Medication Assessment:  Safety precautions to be maintained throughout the outpatient stay will include: orient to surroundings, keep bed in low position, maintain call bell within reach at all times, provide assistance with transfer out of bed and ambulation.  Medication Inspection Compliance: Pill count conducted under aseptic conditions, in front of the patient. Neither the pills nor the bottle was removed from the patient's sight at any time. Once count was completed pills were immediately returned to the patient in their original bottle.  Medication #1: Oxycodone ER (OxyContin) Pill/Patch Count:  43 of 60 pills remain Pill/Patch Appearance: Markings consistent with prescribed medication Bottle Appearance: Standard pharmacy container. Clearly labeled. Filled Date: 1 / 4/ 2024 Last Medication intake:  Today  Medication #2: Oxycodone/APAP Pill/Patch Count:  42 of 60 pills remain Pill/Patch Appearance: Markings consistent with prescribed medication Bottle Appearance: Standard pharmacy container. Clearly labeled. Filled Date: 1 / 8 / 2024 Last Medication intake:  Today

## 2022-04-21 NOTE — Progress Notes (Signed)
PROVIDER NOTE: Information contained herein reflects review and annotations entered in association with encounter. Interpretation of such information and data should be left to medically-trained personnel. Information provided to patient can be located elsewhere in the medical record under "Patient Instructions". Document created using STT-dictation technology, any transcriptional errors that may result from process are unintentional.    Patient: Lawrence Thornton.  Service Category: E/M  Provider: Edward Jolly, MD  DOB: 1955-09-18  DOS: 04/21/2022  Specialty: Interventional Pain Management  MRN: [id number]  Setting: Ambulatory outpatient  PCP: Alba Cory, MD  Type: Established Patient    Referring Provider: Alba Cory, MD  Location: Office  Delivery: Face-to-face     HPI  [name]., a 67 y.o. year old male, is here today because of his Chronic pain syndrome [G89.4]. Lawrence Thornton primary complain today is Back Pain  Last encounter: My last encounter with him was on 01/27/22  Pertinent problems: Lawrence Thornton has Chronic right-sided low back pain without sciatica; Continuous opioid dependence (HCC); S/P knee replacement; S/P lumbar fusion; Osteoarthritis of acromioclavicular joint; Long term current use of opiate analgesic; Lumbar spondylosis; Morbid obesity (HCC); and Chronic use of opiate for therapeutic purpose on their pertinent problem list. Pain Assessment: Severity of Chronic pain is reported as a 6 /10. Location: Low back pain with radiation into left buttock and left lateral thigh onset: More than a month ago. Quality: Burning, Aching, Constant, Numbness, Nagging, Stabbing, Sharp, Shooting. Timing: Constant. Modifying factor(s): laying down, meds, ice, TEN's units. Vitals:  height is 6' (1.829 m) and weight is 250 lb (113.4 kg). His temperature is 97.9 F (36.6 C). His blood pressure is 159/81 (abnormal) and his Thornton is 98. His oxygen saturation is 100%.    Reason for encounter: medication management.    Patient presents today for medication management as well as increased low back pain with radiation into his left buttock and left lateral thigh in a dermatomal fashion.  He has a history of lumbar spinal fusion (L3-S1) and states that the pain has been persistent.  He states that rest, conservative management has not been very helpful.  It has been over 5years since his previous lumbar MRI and I discussed repeating to evaluate for any progression of foraminal stenosis or canal stenosis or disc herniations that could be contributing to his symptoms.  Pharmacotherapy Assessment     Analgesic: Oxycontin 20 mg BID (MME 60), Percocet 10 mg BID prn(MME 30)   Monitoring: [place] PMP: PDMP reviewed during this encounter.       Pharmacotherapy: No side-effects or adverse reactions reported. Compliance: No problems identified. Effectiveness: Clinically acceptable.  Lawrence Pulse, RN  04/21/2022  9:48 AM  Sign when Signing Visit Nursing Pain Medication Assessment:  Safety precautions to be maintained throughout the outpatient stay will include: orient to surroundings, keep bed in low position, maintain call bell within reach at all times, provide assistance with transfer out of bed and ambulation.   Nursing Pain Medication Assessment:  Safety precautions to be maintained throughout the outpatient stay will include: orient to surroundings, keep bed in low position, maintain call bell within reach at all times, provide assistance with transfer out of bed and ambulation.  Medication Inspection Compliance: Pill count conducted under aseptic conditions, in front of the patient. Neither the pills nor the bottle was removed from the patient's sight at any time. Once count was completed pills were immediately returned to the patient in their original bottle.  Medication #1:  Oxycodone ER (OxyContin) Pill/Patch Count:  43 of 60 pills remain Pill/Patch Appearance:  Markings consistent with prescribed medication Bottle Appearance: Standard pharmacy container. Clearly labeled. Filled Date: 1 / 4/ 2024 Last Medication intake:  Today  Medication #2: Oxycodone/APAP Pill/Patch Count:  42 of 60 pills remain Pill/Patch Appearance: Markings consistent with prescribed medication Bottle Appearance: Standard pharmacy container. Clearly labeled. Filled Date: 1 / 8 / 2024 Last Medication intake:  Today     UDS:  Summary  Date Value Ref Range Status  07/29/2021 Note  Final    Comment:    ==================================================================== [employer] 13 (MW) ==================================================================== Test                             Result       Flag       Units  Drug Present and Declared for Prescription Verification   Oxycodone                      3407         EXPECTED   ng/mg creat   Oxymorphone                    3352         EXPECTED   ng/mg creat   Noroxycodone                   3414         EXPECTED   ng/mg creat   Noroxymorphone                 1116         EXPECTED   ng/mg creat    Sources of oxycodone are scheduled prescription medications.    Oxymorphone, noroxycodone, and noroxymorphone are expected    metabolites of oxycodone. Oxymorphone is also available as a    scheduled prescription medication.  ==================================================================== Test                      Result    Flag   Units      Ref Range   Creatinine              182              mg/dL      >=31 ==================================================================== Declared Medications:  The flagging and interpretation on this report are based on the  following declared medications.  Unexpected results may arise from  inaccuracies in the declared medications.   **Note: The testing scope of this panel includes these medications:   Oxycodone (Oxycontin)  Oxycodone (Percocet)   **Note: The testing  scope of this panel does not include the  following reported medications:   Acetaminophen (Percocet)  Amlodipine  Aspirin  Atorvastatin (Lipitor)  Hydrochlorothiazide  Linaclotide (Linzess)  Metformin (Glucophage)  Naloxone (Narcan)  Omega-3 Fatty Acids  Sildenafil (Viagra)  Tamsulosin (Flomax)  Tizanidine (Zanaflex)  Valsartan ==================================================================== For clinical consultation, please call [phone]. ====================================================================      ROS  Constitutional: Denies any fever or chills Gastrointestinal: No reported hemesis, hematochezia, vomiting, or acute GI distress Musculoskeletal:  Positive low back pain with radiation into left buttock and left lateral thigh Neurological: No reported episodes of acute onset apraxia, aphasia, dysarthria, agnosia, amnesia, paralysis, loss of coordination, or loss of consciousness  Medication Review  Semaglutide (1 MG/DOSE), amLODIPine-Valsartan-HCTZ, aspirin EC, atorvastatin, linaclotide, naloxone, omega-3 acid  ethyl esters, oxyCODONE, oxyCODONE-acetaminophen, sildenafil, tamsulosin, and tiZANidine  History Review  Allergy: Lawrence Thornton has No Known Allergies. Drug: Lawrence Thornton  reports no history of drug use. Alcohol:  reports no history of alcohol use. Tobacco:  reports that he quit smoking about 23 years ago. His smoking use included cigarettes. He has a 30.00 pack-year smoking history. He has never used smokeless tobacco. Social: Lawrence Thornton  reports that he quit smoking about 23 years ago. His smoking use included cigarettes. He has a 30.00 pack-year smoking history. He has never used smokeless tobacco. He reports that he does not drink alcohol and does not use drugs. Medical:  has a past medical history of Arthritis and Hypertension. Surgical: Lawrence Thornton  has a past surgical history that includes Back surgery (lower back surgery x 3 ); Appendectomy  (age 40); sebaceous cyst removed (Right, 15 years ago); Toe Fusion (Right); metal removed from eye (years ago); Total knee arthroplasty (Left, 08/10/2014); Total knee arthroplasty (Right, 12/21/2014); Total knee arthroplasty (Right, 12/21/14); Lumbar fusion (1999); Colonoscopy with propofol (N/A, 05/04/2017); and arthroscopic shoulder surgery (Right, 08/31/2017). Family: family history includes Cancer in his mother; Heart attack in his mother; Hyperlipidemia in his father; Hypertension in his father.  Laboratory Chemistry Profile   Renal Lab Results  Component Value Date   BUN 13 05/05/2021   CREATININE 0.80 05/05/2021   LABCREA 133 05/05/2021   BCR NOT APPLICABLE 05/05/2021   GFRAA 108 05/03/2020   GFRNONAA 93 05/03/2020     Hepatic Lab Results  Component Value Date   AST 33 05/05/2021   ALT 44 05/05/2021   ALBUMIN 4.6 11/11/2015   ALKPHOS 76 11/11/2015   HCVAB NEGATIVE 10/06/2016     Electrolytes Lab Results  Component Value Date   NA 139 05/05/2021   K 4.3 05/05/2021   CL 100 05/05/2021   CALCIUM 9.7 05/05/2021     Bone Lab Results  Component Value Date   VD25OH 43 10/06/2016     Inflammation (CRP: Acute Phase) (ESR: Chronic Phase) No results found for: "CRP", "ESRSEDRATE", "LATICACIDVEN"     Note: Above Lab results reviewed.   Sequelae of L3 to S1 posterior spinal fixation and posterior lateral gutter fusion as well as anterior intravertebral body fusion at L4-5.  Fractures of the bilateral fixation rods at the level of L4. Narrative  EXAM: XR LUMBAR SPINE AP LATERAL FLEXION AND EXTENSION DATE: 12/25/2015 9:22 AM ACCESSION: [id number] UN DICTATED: 12/25/2015 9:57 AM INTERPRETATION LOCATION: [facility]  CLINICAL INDICATION: 67 years old Male with -M54.40-Low back pain with sciatica, sciatica laterality unspecified, unspecified back pain laterality, unspecified chronicity    COMPARISON: None.  TECHNIQUE: AP and lateral views of the lumbar spine and lateral  views in flexion and extension.  FINDINGS: Sequelae of posterior spinal fixation with rods span from L3 to S1 with transpedicular screws at L3, L4 and S1 as well as a traversing connecting rod at the level of L3. Both spinal fixation rods are fractured at the level of L4. There has been fusion between the right more prominent than left  L3-L5 transverse processes and vertebral bodies. There is also bridging bone suggesting intervertebral body arthrodesis at L4-5. No evidence of acute fracture or listhesis. There is intervertebral disc height narrowing present at L1-L2 with prominent anterior endplate osteophytosis seen throughout visualized lower thoracic and upper lumbar spine. Sacroiliac joints are well approximated.  Physical Exam  General appearance: Well nourished, well developed, and well hydrated. In no apparent acute distress Mental status: Alert,  oriented x 3 (person, place, & time)       Respiratory: No evidence of acute respiratory distress Eyes: PERLA Vitals: BP (!) 159/81   Thornton 98   Temp 97.9 F (36.6 C)   Ht 6' (1.829 m)   Wt 250 lb (113.4 kg)   SpO2 100%   BMI 33.91 kg/m  BMI: Estimated body mass index is 33.91 kg/m as calculated from the following:   Height as of this encounter: 6' (1.829 m).   Weight as of this encounter: 250 lb (113.4 kg). Ideal: Ideal body weight: 77.6 kg (171 lb 1.2 oz) Adjusted ideal body weight: 91.9 kg (202 lb 10.3 oz)     Lumbar Spine Area Exam  Skin & Axial Inspection: Well healed scar from previous spine surgery detected Alignment: Symmetrical Functional ROM: Pain restricted ROM affecting both sides Stability: No instability detected Muscle Tone/Strength: Functionally intact. No obvious neuro-muscular anomalies detected. Sensory (Neurological): Dermatomal with radiation into left buttock and left lateral thigh   Gait & Posture Assessment  Ambulation: Unassisted Gait: Relatively normal for age and body habitus Posture: WNL  Lower  Extremity Exam      Side: Right lower extremity   Side: Left lower extremity  Stability: No instability observed           Stability: No instability observed          Skin & Extremity Inspection: Skin color, temperature, and hair growth are WNL. No peripheral edema or cyanosis. No masses, redness, swelling, asymmetry, or associated skin lesions. No contractures.   Skin & Extremity Inspection: Skin color, temperature, and hair growth are WNL. No peripheral edema or cyanosis. No masses, redness, swelling, asymmetry, or associated skin lesions. No contractures.  Functional ROM: Unrestricted ROM                   Functional ROM: Unrestricted ROM                  Muscle Tone/Strength: Functionally intact. No obvious neuro-muscular anomalies detected.   Muscle Tone/Strength: Functionally intact. No obvious neuro-muscular anomalies detected.  Sensory (Neurological): Unimpaired         Sensory (Neurological): Dermatomal       DTR: Patellar: deferred today Achilles: deferred today Plantar: deferred today   DTR: Patellar: deferred today Achilles: deferred today Plantar: deferred today  Palpation: No palpable anomalies   Palpation: No palpable anomalies      Assessment   Status Diagnosis  Controlled Controlled Having a Flare-up 1. Chronic pain syndrome   2. S/P lumbar fusion   3. Chronic right-sided low back pain without sciatica   4. Lumbar degenerative disc disease   5. Long term prescription opiate use   6. Chronic use of opiate for therapeutic purpose   7. Radicular pain of left lower extremity           Plan of Care   Mr. Kewon Thornton. has a current medication list which includes the following long-term medication(s): amlodipine-valsartan-hctz, atorvastatin, linaclotide, omega-3 acid ethyl esters, oxycodone-acetaminophen, sildenafil, oxycodone-acetaminophen, [START ON 05/13/2022] oxycodone-acetaminophen, [START ON 06/12/2022] oxycodone-acetaminophen, and [START ON 07/12/2022]  oxycodone-acetaminophen.  Pharmacotherapy (Medications Ordered): Meds ordered this encounter  Medications   oxyCODONE-acetaminophen (PERCOCET) 10-325 MG tablet    Sig: Take 1 tablet by mouth 2 (two) times daily as needed for pain.    Dispense:  60 tablet    Refill:  0   oxyCODONE-acetaminophen (PERCOCET) 10-325 MG tablet    Sig: Take 1 tablet by  mouth 2 (two) times daily as needed for pain.    Dispense:  60 tablet    Refill:  0   oxyCODONE-acetaminophen (PERCOCET) 10-325 MG tablet    Sig: Take 1 tablet by mouth 2 (two) times daily as needed for pain.    Dispense:  60 tablet    Refill:  0   oxyCODONE (OXYCONTIN) 20 mg 12 hr tablet    Sig: Take 1 tablet (20 mg total) by mouth every 12 (twelve) hours. Months last 30 days.    Dispense:  60 tablet    Refill:  0    Chronic Pain: STOP Act (Not applicable) Fill 1 day early if closed on refill date. Avoid benzodiazepines within 8 hours of opioids   oxyCODONE (OXYCONTIN) 20 mg 12 hr tablet    Sig: Take 1 tablet (20 mg total) by mouth every 12 (twelve) hours. Months last 30 days.    Dispense:  60 tablet    Refill:  0    Chronic Pain: STOP Act (Not applicable) Fill 1 day early if closed on refill date. Avoid benzodiazepines within 8 hours of opioids   oxyCODONE (OXYCONTIN) 20 mg 12 hr tablet    Sig: Take 1 tablet (20 mg total) by mouth every 12 (twelve) hours. Months last 30 days.    Dispense:  60 tablet    Refill:  0    Chronic Pain: STOP Act (Not applicable) Fill 1 day early if closed on refill date. Avoid benzodiazepines within 8 hours of opioids   tiZANidine (ZANAFLEX) 4 MG tablet    Sig: Take 1 tablet (4 mg total) by mouth 2 (two) times daily as needed for muscle spasms.    Dispense:  60 tablet    Refill:  5   Orders Placed This Encounter  Procedures   MR LUMBAR SPINE W WO CONTRAST    Patient presents with axial pain with possible radicular component. Please assist us in identifying specific level(s) and laterality of any additional  findings such as: 1. Facet (Zygapophyseal) joint DJD (Hypertrophy, space narrowing, subchondral sclerosis, and/or osteophyte formation) 2. DDD and/or IVDD (Loss of disc height, desiccation, gas patterns, osteophytes, endplate sclerosis, or "Black disc disease") 3. Pars defects 4. Spondylolisthesis, spondylosis, and/or spondyloarthropathies (include Degree/Grade of displacement in mm) (stability) 5. Vertebral body Fractures (acute/chronic) (state percentage of collapse) 6. Demineralization (osteopenia/osteoporotic) 7. Bone pathology 8. Foraminal narrowing  9. Surgical changes 10. Central, Lateral Recess, and/or Foraminal Stenosis (include AP diameter of stenosis in mm) 11. Surgical changes (hardware type, status, and presence of fibrosis) 12. Modic Type Changes (MRI only) 13. IVDD (Disc bulge, protrusion, herniation, extrusion) (Level, laterality, extent)    Standing Status:   Future    Standing Expiration Date:   05/22/2022    Scheduling Instructions:     Imaging must be done as soon as possible. Inform patient that order will expire within 30 days and I will not renew it.    Order Specific Question:   If indicated for the ordered procedure, I authorize the administration of contrast media per Radiology protocol    Answer:   Yes    Order Specific Question:   What is the patient's sedation requirement?    Answer:   No Sedation    Order Specific Question:   Does the patient have a pacemaker or implanted devices?    Answer:   No    Order Specific Question:   Call Results- Best Contact Number?    Answer:   [phone] (  [facility])    Order Specific Question:   Radiology Contrast Protocol - do NOT remove file path    Answer:   [email or web address]    Order Specific Question:   Preferred imaging location?    Answer:   [facility] (table limit-350lbs)     -Patient has Narcan at home -Future interventional options could include spinal cord  stimulator trial/implant.  Will discuss further after lumbar MRI.  Follow-up plan:   Return in about 4 months (around 08/06/2022) for Medication Management, in person.   Recent Visits Date Type Provider Dept  01/27/22 Office Visit Gillis Santa, MD [facility]  Showing recent visits within past 90 days and meeting all other requirements Today's Visits Date Type Provider Dept  04/21/22 Office Visit Gillis Santa, MD [facility]  Showing today's visits and meeting all other requirements Future Appointments No visits were found meeting these conditions. Showing future appointments within next 90 days and meeting all other requirements  I discussed the assessment and treatment plan with the patient. The patient was provided an opportunity to ask questions and all were answered. The patient agreed with the plan and demonstrated an understanding of the instructions.  Patient advised to call back or seek an in-person evaluation if the symptoms or condition worsens.  Duration of encounter: 26minutes.  Note by: Gillis Santa, MD Date: 04/21/2022; Time: 2:00 PM

## 2022-04-27 DIAGNOSIS — K080 Exfoliation of teeth due to systemic causes: Secondary | ICD-10-CM | POA: Diagnosis not present

## 2022-04-29 NOTE — Progress Notes (Unsigned)
Name: Lawrence Thornton.   MRN: [id number]    DOB: 02-15-1956   Date:04/30/2022       Progress Note  Subjective  Chief Complaint  Follow Up  HPI  DMII: diagnosed May 2019 with a A1C was 6.9% he was  on Metformin 750 mg daily but stopped one week ago - he states he feels better without it, he asked about Ozempic for DM, his  A1C was  6.4 % when we started Ozempic in July 2023.  He denies polydipsia ( but states likes water), no polyphagia, has nocturia from BPH.  He is taking Lovaza two daily instead of four daily  and Atorvastatin He has Viagra and takes it prn only for ED. HTN is under control and he is on ARB and last urine micro was negative. He is due for repeat labs.   Dyslipidemia: he is on Lovaza and Atorvastatin and last LDL was 65 ,continue current regiment    Chronic pain and opioid use: he was given Linzess and only takes it prn, very expensive .  He states bowel movements are now daily, but states needs to take medication occasionally to help him have a bowel movement, he states going about 4 days without a bowel movement because he only takes it prn and when he takes he has diarrhea, advised to go down on dose and consider miralax. He is under the care of NP [facility] and Dr. Holley Raring, pain level now is 3/10 right lower back, over the past few weeks he developed left lower back pain with radiculitis, seen by Dr. Holley Raring and will have an MRI done tomorrow .    Obesity: his BMI above 35 however he has been walking one mile with his dog a few days a week, also taking Ozempic since July 2023 and has lost 39 lbs since started on medication.   BPH: Taking Tamsulosin, he states nocturia about twice per night,and is stable at this time Last PSA was normal January 2023 , we will recheck level    HTN: no chest pain, palpitation or SOB. No side effects of medication BP is at goal today,  continue current regiment.   Patient Active Problem List   Diagnosis Date Noted   Radicular pain of left  lower extremity 04/21/2022   Chronic use of opiate for therapeutic purpose 12/15/2018   Morbid obesity ([place]) 07/28/2018   Lumbar spondylosis 03/15/2018   Long term current use of opiate analgesic 12/15/2017   Dyslipidemia associated with type 2 diabetes mellitus ([place]) 08/13/2017   Osteoarthritis of acromioclavicular joint 07/30/2017   S/P lumbar fusion 07/13/2017   Chronic pain syndrome 07/13/2017   Personal history of colonic polyps    Constipation due to opioid therapy 07/18/2015   Hypertriglyceridemia 01/22/2015   S/P knee replacement 12/21/2014   Chronic right-sided low back pain without sciatica 09/21/2014   BPH associated with nocturia 09/21/2014   Dyslipidemia 09/21/2014   Continuous opioid dependence ([place]) 09/21/2014   Lumbar degenerative disc disease 09/21/2014   Failure of erection 09/21/2014    Past Surgical History:  Procedure Laterality Date   APPENDECTOMY  age 67   arthroscopic shoulder surgery Right 08/31/2017   BACK SURGERY  lower back surgery x 3    L 3 L 4 and L 5 are fused together   COLONOSCOPY WITH PROPOFOL N/A 05/04/2017   Procedure: COLONOSCOPY WITH PROPOFOL;  Surgeon: Lucilla Lame, MD;  Location: [facility] ENDOSCOPY;  Service: Endoscopy;  Laterality: N/A;   [place]  L- to L5   metal removed from eye  years ago   sebaceous cyst removed Right 15 years ago   middle  finger    TOE FUSION Right    4th toe   TOTAL KNEE ARTHROPLASTY Left 67/09/2014   Procedure: LEFT TOTAL KNEE ARTHROPLASTY;  Surgeon: Eugenia Mcalpine, MD;  Location: [facility] ORS;  Service: Orthopedics;  Laterality: Left;   TOTAL KNEE ARTHROPLASTY Right 12/21/2014   Procedure: RIGHT TOTAL KNEE ARTHROPLASTY;  Surgeon: Eugenia Mcalpine, MD;  Location: [facility] ORS;  Service: Orthopedics;  Laterality: Right;   TOTAL KNEE ARTHROPLASTY Right 12/21/14    Family History  Problem Relation Age of Onset   Cancer Mother        Throat   Heart attack Mother    Hypertension Father    Hyperlipidemia Father      Social History   Tobacco Use   Smoking status: Former    Packs/day: 1.50    Years: 20.00    Total pack years: 30.00    Types: Cigarettes    Quit date: 04/07/1999    Years since quitting: 23.0   Smokeless tobacco: Never   Tobacco comments:    not smoking   Substance Use Topics   Alcohol use: No     Current Outpatient Medications:    aspirin EC 81 MG tablet, Take 1 tablet (81 mg total) by mouth daily., Disp: 30 tablet, Rfl: 0   linaclotide (LINZESS) 72 MCG capsule, Take 1 capsule (72 mcg total) by mouth daily before breakfast., Disp: 90 capsule, Rfl: 1   naloxone (NARCAN) 2 MG/2ML injection, Inject 1 mL (1 mg total) into the muscle as needed for up to 2 doses (for opioid overdose). Inject content of syringe into thigh muscle. Call 911., Disp: 2 mL, Rfl: 1   [START ON 05/13/2022] oxyCODONE (OXYCONTIN) 20 mg 12 hr tablet, Take 1 tablet (20 mg total) by mouth every 12 (twelve) hours. Months last 30 days., Disp: 60 tablet, Rfl: 0   [START ON 06/12/2022] oxyCODONE (OXYCONTIN) 20 mg 12 hr tablet, Take 1 tablet (20 mg total) by mouth every 12 (twelve) hours. Months last 30 days., Disp: 60 tablet, Rfl: 0   [START ON 07/12/2022] oxyCODONE (OXYCONTIN) 20 mg 12 hr tablet, Take 1 tablet (20 mg total) by mouth every 12 (twelve) hours. Months last 30 days., Disp: 60 tablet, Rfl: 0   oxyCODONE-acetaminophen (PERCOCET) 10-325 MG tablet, Take 1 tablet by mouth 2 (two) times daily as needed for pain., Disp: 60 tablet, Rfl: 0   [START ON 05/13/2022] oxyCODONE-acetaminophen (PERCOCET) 10-325 MG tablet, Take 1 tablet by mouth 2 (two) times daily as needed for pain., Disp: 60 tablet, Rfl: 0   [START ON 06/12/2022] oxyCODONE-acetaminophen (PERCOCET) 10-325 MG tablet, Take 1 tablet by mouth 2 (two) times daily as needed for pain., Disp: 60 tablet, Rfl: 0   [START ON 07/12/2022] oxyCODONE-acetaminophen (PERCOCET) 10-325 MG tablet, Take 1 tablet by mouth 2 (two) times daily as needed for pain., Disp: 60 tablet, Rfl: 0    polyethylene glycol powder (GLYCOLAX/MIRALAX) 17 GM/SCOOP powder, Take 17 g by mouth daily., Disp: 3350 g, Rfl: 1   Semaglutide, 1 MG/DOSE, 4 MG/3ML SOPN, Inject 1 mg as directed once a week., Disp: 9 mL, Rfl: 0   sildenafil (VIAGRA) 100 MG tablet, Take 1 tablet (100 mg total) by mouth daily as needed for erectile dysfunction., Disp: 30 tablet, Rfl: 0   tiZANidine (ZANAFLEX) 4 MG tablet, Take 1 tablet (4 mg total) by mouth 2 (two)  times daily as needed for muscle spasms., Disp: 60 tablet, Rfl: 5   amLODIPine-Valsartan-HCTZ 10-320-25 MG TABS, One tablet by mouth once daily, Disp: 90 tablet, Rfl: 1   atorvastatin (LIPITOR) 20 MG tablet, Take 1 tablet (20 mg total) by mouth daily., Disp: 90 tablet, Rfl: 1   omega-3 acid ethyl esters (LOVAZA) 1 g capsule, Take 2 capsules (2 g total) by mouth 2 (two) times daily., Disp: 360 capsule, Rfl: 1   oxyCODONE-acetaminophen (PERCOCET) 10-325 MG tablet, Take 1 tablet by mouth 2 (two) times daily as needed for pain., Disp: 60 tablet, Rfl: 0   tamsulosin (FLOMAX) 0.4 MG CAPS capsule, Take 1 capsule (0.4 mg total) by mouth daily., Disp: 90 capsule, Rfl: 1  No Known Allergies  I personally reviewed active problem list, medication list, allergies, family history, social history, health maintenance with the patient/caregiver today.   ROS  Constitutional: Negative for fever, positive for weight change.  Respiratory: Negative for cough and shortness of breath.   Cardiovascular: Negative for chest pain or palpitations.  Gastrointestinal: Negative for abdominal pain, no bowel changes.  Musculoskeletal: positive  for gait problem but no joint swelling.  Skin: Negative for rash.  Neurological: Negative for dizziness or headache.  No other specific complaints in a complete review of systems (except as listed in HPI above).   Objective  Vitals:   04/30/22 0752  BP: 130/70  Pulse: 85  Resp: 16  SpO2: 99%  Weight: 240 lb (108.9 kg)  Height: 6' (1.829 m)     Body mass index is 32.55 kg/m.  Physical Exam  Constitutional: Patient appears well-developed and well-nourished. Obese  No distress.  HEENT: head atraumatic, normocephalic, pupils equal and reactive to light, neck supple Cardiovascular: Normal rate, regular rhythm and normal heart sounds.  No murmur heard. No BLE edema. Pulmonary/Chest: Effort normal and breath sounds normal. No respiratory distress. Abdominal: Soft.  There is no tenderness. Muscular skeletal: no pain during palpation of lumbar spine  Psychiatric: Patient has a normal mood and affect. behavior is normal. Judgment and thought content normal.   PHQ2/9:    04/30/2022    7:53 AM 01/27/2022    8:20 AM 10/31/2021    7:43 AM 10/30/2021    9:17 AM 10/06/2021    7:51 AM  Depression screen PHQ 2/9  Decreased Interest 0 0 0 0 0  Down, Depressed, Hopeless 0 0 0 0 0  PHQ - 2 Score 0 0 0 0 0  Altered sleeping 0  0  0  Tired, decreased energy 0  0  0  Change in appetite 0  0  0  Feeling bad or failure about yourself  0  0  0  Trouble concentrating 0  0  0  Moving slowly or fidgety/restless 0  0  0  Suicidal thoughts 0  0  0  PHQ-9 Score 0  0  0    phq 9 is negative   Fall Risk:    04/30/2022    7:53 AM 04/21/2022    9:53 AM 01/27/2022    8:20 AM 10/31/2021    7:43 AM 10/30/2021    9:17 AM  Fall Risk   Falls in the past year? 0 0 0 0 0  Number falls in past yr: 0   0   Injury with Fall? 0   0   Risk for fall due to : No Fall Risks   No Fall Risks   Follow up Falls prevention discussed   Falls  prevention discussed    Diabetic Foot Exam - Simple   Simple Foot Form Visual Inspection No deformities, no ulcerations, no other skin breakdown bilaterally: Yes Sensation Testing Intact to touch and monofilament testing bilaterally: Yes Pulse Check Posterior Tibialis and Dorsalis pulse intact bilaterally: Yes Comments       Functional Status Survey: Is the patient deaf or have difficulty hearing?: Yes Does the  patient have difficulty seeing, even when wearing glasses/contacts?: No Does the patient have difficulty concentrating, remembering, or making decisions?: No Does the patient have difficulty walking or climbing stairs?: No Does the patient have difficulty dressing or bathing?: No Does the patient have difficulty doing errands alone such as visiting a doctor's office or shopping?: No    Assessment & Plan  1. Dyslipidemia associated with type 2 diabetes mellitus (HCC)  - POCT HgB A1C - COMPLETE METABOLIC PANEL WITH GFR - Urine Microalbumin w/creat. ratio - Lipid panel - atorvastatin (LIPITOR) 20 MG tablet; Take 1 tablet (20 mg total) by mouth daily.  Dispense: 90 tablet; Refill: 1 - omega-3 acid ethyl esters (LOVAZA) 1 g capsule; Take 2 capsules (2 g total) by mouth 2 (two) times daily.  Dispense: 360 capsule; Refill: 1  2. Hypertension associated with diabetes ([place])  - amLODIPine-Valsartan-HCTZ 10-320-25 MG TABS; One tablet by mouth once daily  Dispense: 90 tablet; Refill: 1  3. Obesity   Doing well, continue life style modification   4. Continuous opioid dependence ([place])  On lower dose of oxicontin   5. Colon cancer screening  - Ambulatory referral to Gastroenterology  6. BPH associated with nocturia  - PSA - tamsulosin (FLOMAX) 0.4 MG CAPS capsule; Take 1 capsule (0.4 mg total) by mouth daily.  Dispense: 90 capsule; Refill: 1  7. Constipation due to opioid therapy  - linaclotide (LINZESS) 72 MCG capsule; Take 1 capsule (72 mcg total) by mouth daily before breakfast.  Dispense: 90 capsule; Refill: 1 - polyethylene glycol powder (GLYCOLAX/MIRALAX) 17 GM/SCOOP powder; Take 17 g by mouth daily.  Dispense: 3350 g; Refill: 1  8. Chronic pain syndrome  Under the care of Dr. Holley Raring   9. Essential hypertension  - amLODIPine-Valsartan-HCTZ 10-320-25 MG TABS; One tablet by mouth once daily  Dispense: 90 tablet; Refill: 1

## 2022-04-30 ENCOUNTER — Encounter: Payer: Self-pay | Admitting: Family Medicine

## 2022-04-30 ENCOUNTER — Ambulatory Visit (INDEPENDENT_AMBULATORY_CARE_PROVIDER_SITE_OTHER): Payer: Medicare Other | Admitting: Family Medicine

## 2022-04-30 VITALS — BP 130/70 | HR 85 | Resp 16 | Ht 72.0 in | Wt 240.0 lb

## 2022-04-30 DIAGNOSIS — K5903 Drug induced constipation: Secondary | ICD-10-CM

## 2022-04-30 DIAGNOSIS — R351 Nocturia: Secondary | ICD-10-CM

## 2022-04-30 DIAGNOSIS — F1120 Opioid dependence, uncomplicated: Secondary | ICD-10-CM

## 2022-04-30 DIAGNOSIS — E669 Obesity, unspecified: Secondary | ICD-10-CM

## 2022-04-30 DIAGNOSIS — N401 Enlarged prostate with lower urinary tract symptoms: Secondary | ICD-10-CM

## 2022-04-30 DIAGNOSIS — E1169 Type 2 diabetes mellitus with other specified complication: Secondary | ICD-10-CM

## 2022-04-30 DIAGNOSIS — I10 Essential (primary) hypertension: Secondary | ICD-10-CM

## 2022-04-30 DIAGNOSIS — E785 Hyperlipidemia, unspecified: Secondary | ICD-10-CM

## 2022-04-30 DIAGNOSIS — E1159 Type 2 diabetes mellitus with other circulatory complications: Secondary | ICD-10-CM

## 2022-04-30 DIAGNOSIS — G894 Chronic pain syndrome: Secondary | ICD-10-CM

## 2022-04-30 DIAGNOSIS — T402X5A Adverse effect of other opioids, initial encounter: Secondary | ICD-10-CM

## 2022-04-30 DIAGNOSIS — Z1211 Encounter for screening for malignant neoplasm of colon: Secondary | ICD-10-CM

## 2022-04-30 DIAGNOSIS — E66811 Obesity, class 1: Secondary | ICD-10-CM

## 2022-04-30 DIAGNOSIS — I152 Hypertension secondary to endocrine disorders: Secondary | ICD-10-CM

## 2022-04-30 LAB — POCT GLYCOSYLATED HEMOGLOBIN (HGB A1C): Hemoglobin A1C: 5.6 % (ref 4.0–5.6)

## 2022-04-30 MED ORDER — OMEGA-3-ACID ETHYL ESTERS 1 G PO CAPS: 2.0000 g | ORAL_CAPSULE | Freq: Two times a day (BID) | ORAL | 1 refills | Status: DC

## 2022-04-30 MED ORDER — ATORVASTATIN CALCIUM 20 MG PO TABS: 20.0000 mg | ORAL_TABLET | Freq: Every day | ORAL | 1 refills | Status: DC

## 2022-04-30 MED ORDER — LINACLOTIDE 72 MCG PO CAPS: 72.0000 ug | ORAL_CAPSULE | Freq: Every day | ORAL | 1 refills | Status: DC

## 2022-04-30 MED ORDER — AMLODIPINE-VALSARTAN-HCTZ 10-320-25 MG PO TABS: ORAL_TABLET | ORAL | 1 refills | Status: DC

## 2022-04-30 MED ORDER — SEMAGLUTIDE (1 MG/DOSE) 4 MG/3ML ~~LOC~~ SOPN: 1.0000 mg | PEN_INJECTOR | SUBCUTANEOUS | 1 refills | Status: DC

## 2022-04-30 MED ORDER — POLYETHYLENE GLYCOL 3350 17 GM/SCOOP PO POWD: 17.0000 g | Freq: Every day | ORAL | 1 refills | Status: DC

## 2022-04-30 MED ORDER — TAMSULOSIN HCL 0.4 MG PO CAPS: 0.4000 mg | ORAL_CAPSULE | Freq: Every day | ORAL | 1 refills | Status: DC

## 2022-04-30 NOTE — Addendum Note (Signed)
Addended by: Steele Sizer F on: 04/30/2022 08:30 AM   Modules accepted: Orders

## 2022-05-01 ENCOUNTER — Ambulatory Visit
Admission: RE | Admit: 2022-05-01 | Discharge: 2022-05-01 | Disposition: A | Discharge status: Home / Self Care | Payer: Medicare Other | Source: Ambulatory Visit | Attending: Student in an Organized Health Care Education/Training Program | Admitting: Student in an Organized Health Care Education/Training Program

## 2022-05-01 DIAGNOSIS — M48061 Spinal stenosis, lumbar region without neurogenic claudication: Secondary | ICD-10-CM | POA: Diagnosis not present

## 2022-05-01 DIAGNOSIS — Z981 Arthrodesis status: Secondary | ICD-10-CM

## 2022-05-01 DIAGNOSIS — M5136 Other intervertebral disc degeneration, lumbar region: Secondary | ICD-10-CM

## 2022-05-01 DIAGNOSIS — G894 Chronic pain syndrome: Secondary | ICD-10-CM | POA: Insufficient documentation

## 2022-05-01 DIAGNOSIS — M5410 Radiculopathy, site unspecified: Secondary | ICD-10-CM | POA: Diagnosis not present

## 2022-05-01 LAB — COMPLETE METABOLIC PANEL WITH GFR
AG Ratio: 1.6 (calc) (ref 1.0–2.5)
ALT: 23 U/L (ref 9–46)
AST: 20 U/L (ref 10–35)
Albumin: 4.5 g/dL (ref 3.6–5.1)
Alkaline phosphatase (APISO): 78 U/L (ref 35–144)
BUN: 13 mg/dL (ref 7–25)
CO2: 31 mmol/L (ref 20–32)
Calcium: 9.7 mg/dL (ref 8.6–10.3)
Chloride: 99 mmol/L (ref 98–110)
Creat: 0.84 mg/dL (ref 0.70–1.35)
Globulin: 2.8 g/dL (calc) (ref 1.9–3.7)
Glucose, Bld: 100 mg/dL — ABNORMAL HIGH (ref 65–99)
Potassium: 3.9 mmol/L (ref 3.5–5.3)
Sodium: 137 mmol/L (ref 135–146)
Total Bilirubin: 0.4 mg/dL (ref 0.2–1.2)
Total Protein: 7.3 g/dL (ref 6.1–8.1)
eGFR: 96 mL/min/{1.73_m2} (ref >=60)

## 2022-05-01 LAB — MICROALBUMIN / CREATININE URINE RATIO
Creatinine, Urine: 274 mg/dL (ref 20–320)
Microalb Creat Ratio: 7 mcg/mg creat (ref <30)
Microalb, Ur: 2 mg/dL

## 2022-05-01 LAB — LIPID PANEL
Cholesterol: 99 mg/dL (ref <200)
HDL: 45 mg/dL (ref >=40)
LDL Cholesterol (Calc): 42 mg/dL (calc)
Non-HDL Cholesterol (Calc): 54 mg/dL (calc) (ref <130)
Total CHOL/HDL Ratio: 2.2 (calc) (ref <5.0)
Triglycerides: 43 mg/dL (ref <150)

## 2022-05-01 LAB — PSA: PSA: 0.24 ng/mL (ref <=4.00)

## 2022-05-01 MED ORDER — GADOBUTROL 1 MMOL/ML IV SOLN
10.0000 mL | Freq: Once | INTRAVENOUS | Status: AC | PRN
  Administered 2022-05-01: 10 mL via INTRAVENOUS

## 2022-05-07 ENCOUNTER — Other Ambulatory Visit: Payer: Self-pay

## 2022-05-07 ENCOUNTER — Telehealth: Payer: Self-pay

## 2022-05-07 DIAGNOSIS — Z86010 Personal history of colonic polyps: Secondary | ICD-10-CM

## 2022-05-07 MED ORDER — SUTAB 1479-225-188 MG PO TABS: 12.0000 | ORAL_TABLET | Freq: Once | ORAL | 0 refills | Status: AC

## 2022-05-07 MED ORDER — GOLYTELY 236 G PO SOLR: 4000.0000 mL | Freq: Once | ORAL | 0 refills | Status: AC

## 2022-05-07 NOTE — Telephone Encounter (Signed)
Gastroenterology Pre-Procedure Review  Request Date: 06/30/2022 Requesting Physician: Dr. Allen Norris  Patient first wanted the Sutab called in for him and then he said that was to many tablets he had to take. The only liquid prep that his insurance would cover was Golytely so sent that to the pharmacy  PATIENT REVIEW QUESTIONS: The patient responded to the following health history questions as indicated:    1. Are you having any GI issues? No  2. Do you have a personal history of Polyps? Yes 3. Do you have a family history of Colon Cancer or Polyps? no 4. Diabetes Mellitus? no 5. Joint replacements in the past 12 months?no 6. Major health problems in the past 3 months?no 7. Any artificial heart valves, MVP, or defibrillator?no    MEDICATIONS & ALLERGIES:    Patient reports the following regarding taking any anticoagulation/antiplatelet therapy:   Plavix, Coumadin, Eliquis, Xarelto, Lovenox, Pradaxa, Brilinta, or Effient? no Aspirin? 81mg  daily   Patient confirms/reports the following medications:  Current Outpatient Medications  Medication Sig Dispense Refill   amLODIPine-Valsartan-HCTZ 10-320-25 MG TABS One tablet by mouth once daily 90 tablet 1   aspirin EC 81 MG tablet Take 1 tablet (81 mg total) by mouth daily. 30 tablet 0   atorvastatin (LIPITOR) 20 MG tablet Take 1 tablet (20 mg total) by mouth daily. 90 tablet 1   linaclotide (LINZESS) 72 MCG capsule Take 1 capsule (72 mcg total) by mouth daily before breakfast. 90 capsule 1   naloxone (NARCAN) 2 MG/2ML injection Inject 1 mL (1 mg total) into the muscle as needed for up to 2 doses (for opioid overdose). Inject content of syringe into thigh muscle. Call 911. 2 mL 1   omega-3 acid ethyl esters (LOVAZA) 1 g capsule Take 2 capsules (2 g total) by mouth 2 (two) times daily. 360 capsule 1   [START ON 05/13/2022] oxyCODONE (OXYCONTIN) 20 mg 12 hr tablet Take 1 tablet (20 mg total) by mouth every 12 (twelve) hours. Months last 30 days. 60 tablet  0   [START ON 06/12/2022] oxyCODONE (OXYCONTIN) 20 mg 12 hr tablet Take 1 tablet (20 mg total) by mouth every 12 (twelve) hours. Months last 30 days. 60 tablet 0   [START ON 07/12/2022] oxyCODONE (OXYCONTIN) 20 mg 12 hr tablet Take 1 tablet (20 mg total) by mouth every 12 (twelve) hours. Months last 30 days. 60 tablet 0   oxyCODONE-acetaminophen (PERCOCET) 10-325 MG tablet Take 1 tablet by mouth 2 (two) times daily as needed for pain. 60 tablet 0   oxyCODONE-acetaminophen (PERCOCET) 10-325 MG tablet Take 1 tablet by mouth 2 (two) times daily as needed for pain. 60 tablet 0   [START ON 05/13/2022] oxyCODONE-acetaminophen (PERCOCET) 10-325 MG tablet Take 1 tablet by mouth 2 (two) times daily as needed for pain. 60 tablet 0   [START ON 06/12/2022] oxyCODONE-acetaminophen (PERCOCET) 10-325 MG tablet Take 1 tablet by mouth 2 (two) times daily as needed for pain. 60 tablet 0   [START ON 07/12/2022] oxyCODONE-acetaminophen (PERCOCET) 10-325 MG tablet Take 1 tablet by mouth 2 (two) times daily as needed for pain. 60 tablet 0   polyethylene glycol powder (GLYCOLAX/MIRALAX) 17 GM/SCOOP powder Take 17 g by mouth daily. 3350 g 1   Semaglutide, 1 MG/DOSE, 4 MG/3ML SOPN Inject 1 mg as directed once a week. 9 mL 1   sildenafil (VIAGRA) 100 MG tablet Take 1 tablet (100 mg total) by mouth daily as needed for erectile dysfunction. 30 tablet 0   tamsulosin (FLOMAX) 0.4  MG CAPS capsule Take 1 capsule (0.4 mg total) by mouth daily. 90 capsule 1   tiZANidine (ZANAFLEX) 4 MG tablet Take 1 tablet (4 mg total) by mouth 2 (two) times daily as needed for muscle spasms. 60 tablet 5   No current facility-administered medications for this visit.    Patient confirms/reports the following allergies:  No Known Allergies  No orders of the defined types were placed in this encounter.   AUTHORIZATION INFORMATION Primary Insurance: 1D#: Group #:  Secondary Insurance: 1D#: Group #:  SCHEDULE INFORMATION: Date:   Time: Location:

## 2022-05-11 DIAGNOSIS — K080 Exfoliation of teeth due to systemic causes: Secondary | ICD-10-CM | POA: Diagnosis not present

## 2022-05-19 DIAGNOSIS — K080 Exfoliation of teeth due to systemic causes: Secondary | ICD-10-CM | POA: Diagnosis not present

## 2022-05-20 ENCOUNTER — Ambulatory Visit
Payer: Medicare Other | Attending: Student in an Organized Health Care Education/Training Program | Admitting: Student in an Organized Health Care Education/Training Program

## 2022-05-20 ENCOUNTER — Encounter: Payer: Self-pay | Admitting: Student in an Organized Health Care Education/Training Program

## 2022-05-20 VITALS — BP 130/73 | HR 89 | Temp 97.2°F | Resp 18 | Ht 72.0 in | Wt 245.0 lb

## 2022-05-20 DIAGNOSIS — G894 Chronic pain syndrome: Secondary | ICD-10-CM | POA: Diagnosis not present

## 2022-05-20 DIAGNOSIS — M5410 Radiculopathy, site unspecified: Secondary | ICD-10-CM | POA: Insufficient documentation

## 2022-05-20 DIAGNOSIS — M48062 Spinal stenosis, lumbar region with neurogenic claudication: Secondary | ICD-10-CM | POA: Insufficient documentation

## 2022-05-20 DIAGNOSIS — Z981 Arthrodesis status: Secondary | ICD-10-CM | POA: Diagnosis not present

## 2022-05-20 NOTE — Progress Notes (Signed)
PROVIDER NOTE: Information contained herein reflects review and annotations entered in association with encounter. Interpretation of such information and data should be left to medically-trained personnel. Information provided to patient can be located elsewhere in the medical record under "Patient Instructions". Document created using STT-dictation technology, any transcriptional errors that may result from process are unintentional.    Patient: Lawrence Thornton.  Service Category: E/M  Provider: Gillis Santa, MD  DOB: Oct 23, 1955  DOS: 05/20/2022  Referring Provider: Steele Sizer, MD  MRN: [id number]  Specialty: Interventional Pain Management  PCP: Steele Sizer, MD  Type: Established Patient  Setting: Ambulatory outpatient    Location: Office  Delivery: Face-to-face     HPI  [name]., a 67 y.o. year old male, is here today because of his Spinal stenosis, lumbar region, with neurogenic claudication [M48.062]. [name] primary complain today is Back Pain Last encounter: My last encounter with him was on 04/21/2022. Pertinent problems: [name] has Chronic right-sided low back pain without sciatica; Continuous opioid dependence ([place]); S/P knee replacement; S/P lumbar fusion; Osteoarthritis of acromioclavicular joint; Long term current use of opiate analgesic; Lumbar spondylosis; Morbid obesity ([place]); and Chronic use of opiate for therapeutic purpose on their pertinent problem list. Pain Assessment: Severity of Chronic pain is reported as a 4 /10. Location: Back Lower/ . Onset: More than a month ago. Quality: Constant, Dull. Timing: Constant. Modifying factor(s): meds. Vitals:  height is 6' (1.829 m) and weight is 245 lb (111.1 kg). His temperature is 97.2 F (36.2 C) (abnormal). His blood pressure is 130/73 and his pulse is 89. His respiration is 18 and oxygen saturation is 99%.  BMI: Estimated body mass index is 33.23 kg/m as calculated from the following:    Height as of this encounter: 6' (1.829 m).   Weight as of this encounter: 245 lb (111.1 kg).  Reason for encounter: follow-up evaluation to review lumbar MRI  Patient states that the pain flare that he saw me for January 16 has resolved.  At that time he was having severe low back pain with radiation into his left buttock and left lateral thigh.  He states that the pain has significantly decreased.  Pharmacotherapy Assessment  Analgesic: Oxycontin 40 mg BID (MME 120), Percocet 10 mg BID prn(MME 30)   Monitoring: [place] PMP: PDMP reviewed during this encounter.       Pharmacotherapy: No side-effects or adverse reactions reported. Compliance: No problems identified. Effectiveness: Clinically acceptable.  Dewayne Shorter, RN  05/20/2022  1:05 PM  Sign when Signing Visit Safety precautions to be maintained throughout the outpatient stay will include: orient to surroundings, keep bed in low position, maintain call bell within reach at all times, provide assistance with transfer out of bed and ambulation.    No results found for: "CBDTHCR" No results found for: "D8THCCBX" No results found for: "D9THCCBX"  UDS:  Summary  Date Value Ref Range Status  07/29/2021 Note  Final    Comment:    ==================================================================== [facility] (MW) ==================================================================== Test                             Result       Flag       Units  Drug Present and Declared for Prescription Verification   Oxycodone                      3407  EXPECTED   ng/mg creat   Oxymorphone                    3352         EXPECTED   ng/mg creat   Noroxycodone                   3414         EXPECTED   ng/mg creat   Noroxymorphone                 1116         EXPECTED   ng/mg creat    Sources of oxycodone are scheduled prescription medications.    Oxymorphone, noroxycodone, and noroxymorphone are expected    metabolites of oxycodone.  Oxymorphone is also available as a    scheduled prescription medication.  ==================================================================== Test                      Result    Flag   Units      Ref Range   Creatinine              182              mg/dL      >=20 ==================================================================== Declared Medications:  The flagging and interpretation on this report are based on the  following declared medications.  Unexpected results may arise from  inaccuracies in the declared medications.   **Note: The testing scope of this panel includes these medications:   Oxycodone (Oxycontin)  Oxycodone (Percocet)   **Note: The testing scope of this panel does not include the  following reported medications:   Acetaminophen (Percocet)  Amlodipine  Aspirin  Atorvastatin (Lipitor)  Hydrochlorothiazide  Linaclotide (Linzess)  Metformin (Glucophage)  Naloxone (Narcan)  Omega-3 Fatty Acids  Sildenafil (Viagra)  Tamsulosin (Flomax)  Tizanidine (Zanaflex)  Valsartan ==================================================================== For clinical consultation, please call [phone]. ====================================================================       ROS  Constitutional: Denies any fever or chills Gastrointestinal: No reported hemesis, hematochezia, vomiting, or acute GI distress Musculoskeletal:  Low back, left leg pain Neurological: No reported episodes of acute onset apraxia, aphasia, dysarthria, agnosia, amnesia, paralysis, loss of coordination, or loss of consciousness  Medication Review  Semaglutide (1 MG/DOSE), amLODIPine-Valsartan-HCTZ, aspirin EC, atorvastatin, linaclotide, naloxone, omega-3 acid ethyl esters, oxyCODONE, oxyCODONE-acetaminophen, polyethylene glycol powder, sildenafil, tamsulosin, and tiZANidine  History Review  Allergy: [name] has No Known Allergies. Drug: [name]  reports no history of drug  use. Alcohol:  reports no history of alcohol use. Tobacco:  reports that he quit smoking about 23 years ago. His smoking use included cigarettes. He has a 30.00 pack-year smoking history. He has never used smokeless tobacco. Social: [name]  reports that he quit smoking about 23 years ago. His smoking use included cigarettes. He has a 30.00 pack-year smoking history. He has never used smokeless tobacco. He reports that he does not drink alcohol and does not use drugs. Medical:  has a past medical history of Arthritis and Hypertension. Surgical: [name]  has a past surgical history that includes Back surgery (lower back surgery x 3 ); Appendectomy (age 33); sebaceous cyst removed (Right, 15 years ago); Toe Fusion (Right); metal removed from eye (years ago); Total knee arthroplasty (Left, 08/10/2014); Total knee arthroplasty (Right, 12/21/2014); Total knee arthroplasty (Right, 12/21/14); Lumbar fusion (1999); Colonoscopy with propofol (N/A, 05/04/2017); and arthroscopic shoulder surgery (Right, 08/31/2017). Family: family history includes Cancer in  his mother; Heart attack in his mother; Hyperlipidemia in his father; Hypertension in his father.  Laboratory Chemistry Profile   Renal Lab Results  Component Value Date   BUN 13 04/30/2022   CREATININE 0.84 04/30/2022   LABCREA 274 04/30/2022   BCR SEE NOTE: 04/30/2022   GFRAA 108 05/03/2020   GFRNONAA 93 05/03/2020    Hepatic Lab Results  Component Value Date   AST 20 04/30/2022   ALT 23 04/30/2022   ALBUMIN 4.6 11/11/2015   ALKPHOS 76 11/11/2015   HCVAB NEGATIVE 10/06/2016    Electrolytes Lab Results  Component Value Date   NA 137 04/30/2022   K 3.9 04/30/2022   CL 99 04/30/2022   CALCIUM 9.7 04/30/2022    Bone Lab Results  Component Value Date   VD25OH 43 10/06/2016    Inflammation (CRP: Acute Phase) (ESR: Chronic Phase) No results found for: "CRP", "ESRSEDRATE", "LATICACIDVEN"       Note: Above Lab results  reviewed.  Recent Imaging Review  MR LUMBAR SPINE W WO CONTRAST CLINICAL DATA:  67 year old male with persistent low back pain. Left leg radicular pain. Prior surgery.  EXAM: MRI LUMBAR SPINE WITHOUT AND WITH CONTRAST  TECHNIQUE: Multiplanar and multiecho pulse sequences of the lumbar spine were obtained without and with intravenous contrast.  CONTRAST:  72m GADAVIST GADOBUTROL 1 MMOL/ML IV SOLN  COMPARISON:  Lumbar radiographs 03/16/2013.  FINDINGS: Segmentation:  Normal on the comparison.  Alignment: Straightening of lumbar lordosis not significantly changed since 2014. No significant scoliosis or spondylolisthesis.  Vertebrae: Abundant susceptibility artifact associated with chronic posterior spinal fusion hardware L3 through the sacrum.  Evidence of solid arthrodesis at L4-L5 and L5-S1, and possibly at the posterior elements of L3-L4. Advanced mostly chronic appearing degenerative endplate marrow changes at L1-L2, see details below. Background bone marrow signal within normal limits. No definite No acute osseous abnormality identified.  Conus medullaris and cauda equina: Conus extends to the L1, extending toward the L1-L2 disc level. Degenerative conus mass effect (series 5, image 10), but no lower spinal cord or conus signal abnormality.  No abnormal intradural enhancement or dural thickening is evident.  Paraspinal and other soft tissues: Postoperative changes to the posterior lumbar paraspinal soft tissues. Negative visible abdominal viscera.  Disc levels:  T12-L1: Some disc space loss with bulky circumferential disc osteophyte complex. Broad-based posterior component and mild to moderate facet and ligament flavum hypertrophy greater on the left. Mild spinal stenosis, with up to mild mass effect on the lower thoracic spinal cord here (series 5, image 10). Mild left lateral recess stenosis (left L1 nerve level). And moderate to severe multifactorial left T12  foraminal stenosis (series 5, image 12).  L1-L2: Severe disc space loss, bulky circumferential disc osteophyte complex. Broad-based posterior component. Relatively mild facet and ligament flavum hypertrophy. Some epidural lipomatosis. Severe spinal stenosis (series 8, image 9) with some mass effect on the conus. Moderate to severe bilateral L1 foraminal stenosis.  L2-L3: Better preserved disc space, but circumferential disc osteophyte complex with a bulky and broad-based posterior component eccentric to the left. Partially visible at least moderate facet and ligament flavum hypertrophy with some epidural lipomatosis.  Severe spinal stenosis, with lateral recess stenosis greater on the left (series 8, image 18, descending left L3 nerve level). Mild to moderate L2 foraminal stenosis greater on the left.  L3-L4:  Prior decompression and fusion.  No adverse features.  L4-L5: Prior decompression and fusion. Evidence of solid arthrodesis, no adverse features.  L5-S1: Prior decompression and  fusion. Evidence of solid arthrodesis. No adverse features.  IMPRESSION: 1. Prior decompression and fusion from L3-L4 through L5-S1 with no adverse features.  2. But bulky disc and endplate degeneration elsewhere T12-L1 through the adjacent L2-L3 segment. Subsequent moderate to severe multifactorial spinal stenosis at L1-L2 (level of the conus), and L2-L3 (left lateral recess more affected). Mild spinal stenosis at T12-L1. Associated mass effect on the lower thoracic spinal cord and conus, but no cord/conus signal abnormality.  3. Associated moderate to severe left T12 and bilateral L1 neural foraminal stenosis. Up to moderate left L2 foraminal stenosis.  Electronically Signed   By: Genevie Ann M.D.   On: 05/02/2022 08:00 Note: Reviewed        Physical Exam  General appearance: Well nourished, well developed, and well hydrated. In no apparent acute distress Mental status: Alert, oriented x 3  (person, place, & time)       Respiratory: No evidence of acute respiratory distress Eyes: PERLA Vitals: BP 130/73   Pulse 89   Temp (!) 97.2 F (36.2 C)   Resp 18   Ht 6' (1.829 m)   Wt 245 lb (111.1 kg)   SpO2 99%   BMI 33.23 kg/m  BMI: Estimated body mass index is 33.23 kg/m as calculated from the following:   Height as of this encounter: 6' (1.829 m).   Weight as of this encounter: 245 lb (111.1 kg). Ideal: Ideal body weight: 77.6 kg (171 lb 1.2 oz) Adjusted ideal body weight: 91 kg (200 lb 10.3 oz)  Lumbar Spine Area Exam  Skin & Axial Inspection: Well healed scar from previous spine surgery detected Alignment: Symmetrical Functional ROM: Pain restricted ROM affecting both sides Stability: No instability detected Muscle Tone/Strength: Functionally intact. No obvious neuro-muscular anomalies detected. Sensory (Neurological): Dermatomal with radiation into left buttock and left lateral thigh   Gait & Posture Assessment  Ambulation: Unassisted Gait: Relatively normal for age and body habitus Posture: WNL  Lower Extremity Exam      Side: Right lower extremity   Side: Left lower extremity  Stability: No instability observed           Stability: No instability observed          Skin & Extremity Inspection: Skin color, temperature, and hair growth are WNL. No peripheral edema or cyanosis. No masses, redness, swelling, asymmetry, or associated skin lesions. No contractures.   Skin & Extremity Inspection: Skin color, temperature, and hair growth are WNL. No peripheral edema or cyanosis. No masses, redness, swelling, asymmetry, or associated skin lesions. No contractures.  Functional ROM: Unrestricted ROM                   Functional ROM: Unrestricted ROM                  Muscle Tone/Strength: Functionally intact. No obvious neuro-muscular anomalies detected.   Muscle Tone/Strength: Functionally intact. No obvious neuro-muscular anomalies detected.  Sensory (Neurological):  Unimpaired         Sensory (Neurological): Dermatomal       DTR: Patellar: deferred today Achilles: deferred today Plantar: deferred today   DTR: Patellar: deferred today Achilles: deferred today Plantar: deferred today  Palpation: No palpable anomalies   Palpation: No palpable anomalies    Assessment   Diagnosis Status  1. Spinal stenosis, lumbar region, with neurogenic claudication   2. S/P lumbar fusion   3. Radicular pain of left lower extremity   4. Chronic pain syndrome  Controlled Controlled Controlled   Updated Problems: Problem  Spinal Stenosis, Lumbar Region, With Neurogenic Claudication    Plan of Care  I am glad to hear that Edan is doing better than he was when he saw me last month.  We reviewed his lumbar MRI which shows severe adjacent segment disease.  He has endplate degeneration and disc herniations between T12-L1 through L2-L3.  He has moderate to severe multifactorial spinal stenosis at L1-L2, L2-L3 for the left lateral recess being more effective.  He has an associated mass effect on his lower thoracic spinal cord without any cord signal abnormality.  In addition, and his thoracic spine he has left T12-L1 severe neuroforaminal stenosis and moderate left L2 foraminal stenosis.  We discussed treatment options which could include transforaminal epidural steroid injections as well as interlaminar spinal injections in the areas that are showing adjacent segment disease.  We also briefly discussed spinal cord stimulation as a potential therapy for persistent and severe low back and left leg pain in the context of having previous lumbar spinal fusion.  Patient states that he will contact me if he has a pain flare and would like to pursue 1 of these options.  Follow-up plan:   Return for Keep sch. appt.    Recent Visits Date Type Provider Dept  04/21/22 Office Visit Gillis Santa, MD [facility]  Showing recent visits within past 90 days and meeting  all other requirements Today's Visits Date Type Provider Dept  05/20/22 Office Visit Gillis Santa, MD [facility]  Showing today's visits and meeting all other requirements Future Appointments Date Type Provider Dept  07/23/22 Appointment Gillis Santa, MD [facility]  Showing future appointments within next 90 days and meeting all other requirements  I discussed the assessment and treatment plan with the patient. The patient was provided an opportunity to ask questions and all were answered. The patient agreed with the plan and demonstrated an understanding of the instructions.  Patient advised to call back or seek an in-person evaluation if the symptoms or condition worsens.  Duration of encounter: 64mnutes.  Total time on encounter, as per AMA guidelines included both the face-to-face and non-face-to-face time personally spent by the physician and/or other qualified health care professional(s) on the day of the encounter (includes time in activities that require the physician or other qualified health care professional and does not include time in activities normally performed by clinical staff). Physician's time may include the following activities when performed: Preparing to see the patient (e.g., pre-charting review of records, searching for previously ordered imaging, lab work, and nerve conduction tests) Review of prior analgesic pharmacotherapies. Reviewing PMP Interpreting ordered tests (e.g., lab work, imaging, nerve conduction tests) Performing post-procedure evaluations, including interpretation of diagnostic procedures Obtaining and/or reviewing separately obtained history Performing a medically appropriate examination and/or evaluation Counseling and educating the patient/family/caregiver Ordering medications, tests, or procedures Referring and communicating with other health care professionals (when not separately reported) Documenting clinical  information in the electronic or other health record Independently interpreting results (not separately reported) and communicating results to the patient/ family/caregiver Care coordination (not separately reported)  Note by: BGillis Santa MD Date: 05/20/2022; Time: 2:10 PM

## 2022-05-20 NOTE — Progress Notes (Signed)
Safety precautions to be maintained throughout the outpatient stay will include: orient to surroundings, keep bed in low position, maintain call bell within reach at all times, provide assistance with transfer out of bed and ambulation.  

## 2022-06-15 DIAGNOSIS — K080 Exfoliation of teeth due to systemic causes: Secondary | ICD-10-CM | POA: Diagnosis not present

## 2022-06-30 ENCOUNTER — Other Ambulatory Visit: Payer: Self-pay

## 2022-06-30 ENCOUNTER — Ambulatory Visit: Payer: Medicare Other | Admitting: Certified Registered"

## 2022-06-30 ENCOUNTER — Encounter: Payer: Self-pay | Admitting: Gastroenterology

## 2022-06-30 ENCOUNTER — Ambulatory Visit
Admission: RE | Admit: 2022-06-30 | Discharge: 2022-06-30 | Disposition: A | Discharge status: Home / Self Care | Payer: Medicare Other | Attending: Gastroenterology | Admitting: Gastroenterology

## 2022-06-30 ENCOUNTER — Encounter
Admission: RE | Disposition: A | Discharge status: Home / Self Care | Payer: Self-pay | Source: Home / Self Care | Attending: Gastroenterology

## 2022-06-30 DIAGNOSIS — I10 Essential (primary) hypertension: Secondary | ICD-10-CM | POA: Diagnosis not present

## 2022-06-30 DIAGNOSIS — K5730 Diverticulosis of large intestine without perforation or abscess without bleeding: Secondary | ICD-10-CM | POA: Diagnosis not present

## 2022-06-30 DIAGNOSIS — E119 Type 2 diabetes mellitus without complications: Secondary | ICD-10-CM | POA: Diagnosis not present

## 2022-06-30 DIAGNOSIS — M1990 Unspecified osteoarthritis, unspecified site: Secondary | ICD-10-CM | POA: Diagnosis not present

## 2022-06-30 DIAGNOSIS — Z1211 Encounter for screening for malignant neoplasm of colon: Secondary | ICD-10-CM | POA: Diagnosis not present

## 2022-06-30 DIAGNOSIS — Z86010 Personal history of colonic polyps: Secondary | ICD-10-CM

## 2022-06-30 DIAGNOSIS — Z87891 Personal history of nicotine dependence: Secondary | ICD-10-CM | POA: Insufficient documentation

## 2022-06-30 DIAGNOSIS — K640 First degree hemorrhoids: Secondary | ICD-10-CM | POA: Diagnosis not present

## 2022-06-30 DIAGNOSIS — Z7985 Long-term (current) use of injectable non-insulin antidiabetic drugs: Secondary | ICD-10-CM | POA: Insufficient documentation

## 2022-06-30 DIAGNOSIS — Z8249 Family history of ischemic heart disease and other diseases of the circulatory system: Secondary | ICD-10-CM | POA: Diagnosis not present

## 2022-06-30 HISTORY — PX: COLONOSCOPY WITH PROPOFOL: SHX5780

## 2022-06-30 SURGERY — COLONOSCOPY WITH PROPOFOL
Anesthesia: General

## 2022-06-30 MED ORDER — LACTATED RINGERS IV SOLN: INTRAVENOUS | Status: DC | PRN

## 2022-06-30 MED ORDER — PROPOFOL 10 MG/ML IV BOLUS
INTRAVENOUS | Status: DC | PRN
  Administered 2022-06-30: 150 mg via INTRAVENOUS

## 2022-06-30 MED ORDER — LIDOCAINE HCL (CARDIAC) PF 100 MG/5ML IV SOSY
PREFILLED_SYRINGE | INTRAVENOUS | Status: DC | PRN
  Administered 2022-06-30: 100 mg via INTRAVENOUS

## 2022-06-30 MED ORDER — ONDANSETRON HCL 4 MG/2ML IJ SOLN
INTRAMUSCULAR | Status: DC | PRN
  Administered 2022-06-30: 4 mg via INTRAVENOUS

## 2022-06-30 MED ORDER — SODIUM CHLORIDE 0.9 % IV SOLN: INTRAVENOUS | Status: DC

## 2022-06-30 MED ORDER — ROCURONIUM BROMIDE 100 MG/10ML IV SOLN
INTRAVENOUS | Status: DC | PRN
  Administered 2022-06-30: 120 mg via INTRAVENOUS

## 2022-06-30 NOTE — Anesthesia Preprocedure Evaluation (Addendum)
Anesthesia Evaluation  Patient identified by MRN, date of birth, ID band Patient awake    Reviewed: Allergy & Precautions, H&P , NPO status , Patient's Chart, lab work & pertinent test results  Airway Mallampati: II  TM Distance: >3 FB Neck ROM: full    Dental no notable dental hx.    Pulmonary former smoker   Pulmonary exam normal        Cardiovascular hypertension, Normal cardiovascular exam     Neuro/Psych S/P lumbar fusion  negative psych ROS   GI/Hepatic negative GI ROS, Neg liver ROS,,,  Endo/Other  diabetes, Well Controlled, Type 2    Renal/GU negative Renal ROS  negative genitourinary   Musculoskeletal  (+) Arthritis ,    Abdominal  (+) + obese  Peds  Hematology negative hematology ROS (+)   Anesthesia Other Findings Past Medical History: No date: Arthritis     Comment:  oa No date: Hypertension  Past Surgical History: age 25: APPENDECTOMY 08/31/2017: arthroscopic shoulder surgery; Right lower back surgery x 3 : BACK SURGERY     Comment:  L 3 L 4 and L 5 are fused together 05/04/2017: COLONOSCOPY WITH PROPOFOL; N/A     Comment:  Procedure: COLONOSCOPY WITH PROPOFOL;  Surgeon: Lucilla Lame, MD;  Location: [facility];  Service:               Endoscopy;  Laterality: N/A; 1999: LUMBAR FUSION     Comment:  L- to L5 years ago: metal removed from eye 15 years ago: sebaceous cyst removed; Right     Comment:  middle  finger  No date: TOE FUSION; Right     Comment:  4th toe 08/10/2014: TOTAL KNEE ARTHROPLASTY; Left     Comment:  Procedure: LEFT TOTAL KNEE ARTHROPLASTY;  Surgeon:               Sydnee Cabal, MD;  Location: [facility];  Service:               Orthopedics;  Laterality: Left; 12/21/2014: TOTAL KNEE ARTHROPLASTY; Right     Comment:  Procedure: RIGHT TOTAL KNEE ARTHROPLASTY;  Surgeon:               Sydnee Cabal, MD;  Location: [facility];  Service:               Orthopedics;   Laterality: Right; 12/21/14: TOTAL KNEE ARTHROPLASTY; Right     Reproductive/Obstetrics negative OB ROS                             Anesthesia Physical Anesthesia Plan  ASA: 2  Anesthesia Plan: General   Post-op Pain Management:    Induction: Intravenous and Rapid sequence  PONV Risk Score and Plan: Propofol infusion and TIVA  Airway Management Planned: Oral ETT  Additional Equipment:   Intra-op Plan:   Post-operative Plan:   Informed Consent: I have reviewed the patients History and Physical, chart, labs and discussed the procedure including the risks, benefits and alternatives for the proposed anesthesia with the patient or authorized representative who has indicated his/her understanding and acceptance.     Dental Advisory Given  Plan Discussed with: CRNA and Surgeon  Anesthesia Plan Comments: (Semaglutide taken Friday. Pt denies retention symptoms. I advised cancellation and to come when medication properly held but patient accepted risk of aspiration and wishes to proceed. Will  provide RSI with ETT and gastric decompression. )        Anesthesia Quick Evaluation

## 2022-06-30 NOTE — Anesthesia Postprocedure Evaluation (Signed)
Anesthesia Post Note  Patient: Lawrence Thornton.  Procedure(s) Performed: COLONOSCOPY WITH PROPOFOL  Patient location during evaluation: PACU Anesthesia Type: General Level of consciousness: awake and alert Pain management: pain level controlled Vital Signs Assessment: post-procedure vital signs reviewed and stable Respiratory status: spontaneous breathing, nonlabored ventilation and respiratory function stable Cardiovascular status: blood pressure returned to baseline and stable Postop Assessment: no apparent nausea or vomiting Anesthetic complications: no   No notable events documented.   Last Vitals:  Vitals:   06/30/22 0959 06/30/22 1011  BP: 128/77 125/79  Pulse: 65 61  Resp: 10 12  Temp:    SpO2: 98% 98%    Last Pain:  Vitals:   06/30/22 1011  TempSrc:   PainSc: 0-No pain                 [place] Ouch

## 2022-06-30 NOTE — Transfer of Care (Signed)
Immediate Anesthesia Transfer of Care Note  Patient: Lawrence Thornton.  Procedure(s) Performed: COLONOSCOPY WITH PROPOFOL  Patient Location: PACU  Anesthesia Type:General  Level of Consciousness: awake  Airway & Oxygen Therapy: Patient Spontanous Breathing  Post-op Assessment: Report given to RN and Post -op Vital signs reviewed and stable  Post vital signs: Reviewed and stable  Last Vitals:  Vitals Value Taken Time  BP 120/71 06/30/22 0951  Temp    Pulse 77 06/30/22 0951  Resp 14 06/30/22 0951  SpO2 96 % 06/30/22 0951    Last Pain:  Vitals:   06/30/22 0951  TempSrc:   PainSc: Asleep         Complications: No notable events documented.

## 2022-06-30 NOTE — Anesthesia Procedure Notes (Signed)
Procedure Name: Intubation Date/Time: 06/30/2022 9:26 AM  Performed by: Biagio Borg, CRNAPre-anesthesia Checklist: Patient identified, Emergency Drugs available, Suction available and Patient being monitored Patient Re-evaluated:Patient Re-evaluated prior to induction Oxygen Delivery Method: Circle system utilized Preoxygenation: Pre-oxygenation with 100% oxygen Induction Type: IV induction Ventilation: Mask ventilation without difficulty Laryngoscope Size: McGraph and 4 Grade View: Grade I Tube type: Oral Tube size: 7.0 mm Number of attempts: 1 Airway Equipment and Method: Stylet Placement Confirmation: ETT inserted through vocal cords under direct vision, positive ETCO2 and breath sounds checked- equal and bilateral Secured at: 23 cm Tube secured with: Tape Dental Injury: Teeth and Oropharynx as per pre-operative assessment

## 2022-06-30 NOTE — H&P (Signed)
Lucilla Lame, MD [address] [place], [place] [address] Phone:[phone] Fax : [phone]  Primary Care Physician:  Steele Sizer, MD Primary Gastroenterologist:  Dr. Allen Norris  Pre-Procedure History & Physical: HPI:  Lawrence Plant. is a 67 y.o. male is here for an colonoscopy.   Past Medical History:  Diagnosis Date   Arthritis    oa   Hypertension     Past Surgical History:  Procedure Laterality Date   APPENDECTOMY  age 23   arthroscopic shoulder surgery Right 08/31/2017   BACK SURGERY  lower back surgery x 3    L 3 L 4 and L 5 are fused together   COLONOSCOPY WITH PROPOFOL N/A 05/04/2017   Procedure: COLONOSCOPY WITH PROPOFOL;  Surgeon: Lucilla Lame, MD;  Location: [facility] ENDOSCOPY;  Service: Endoscopy;  Laterality: N/A;   LUMBAR FUSION  1999   L- to L5   metal removed from eye  years ago   sebaceous cyst removed Right 15 years ago   middle  finger    TOE FUSION Right    4th toe   TOTAL KNEE ARTHROPLASTY Left 08/10/2014   Procedure: LEFT TOTAL KNEE ARTHROPLASTY;  Surgeon: Sydnee Cabal, MD;  Location: [facility];  Service: Orthopedics;  Laterality: Left;   TOTAL KNEE ARTHROPLASTY Right 12/21/2014   Procedure: RIGHT TOTAL KNEE ARTHROPLASTY;  Surgeon: Sydnee Cabal, MD;  Location: [facility];  Service: Orthopedics;  Laterality: Right;   TOTAL KNEE ARTHROPLASTY Right 12/21/14    Prior to Admission medications   Medication Sig Start Date End Date Taking? Authorizing Provider  oxyCODONE (OXYCONTIN) 20 mg 12 hr tablet Take 1 tablet (20 mg total) by mouth every 12 (twelve) hours. Months last 30 days. 06/12/22 07/12/22 Yes Gillis Santa, MD  oxyCODONE-acetaminophen (PERCOCET) 10-325 MG tablet Take 1 tablet by mouth 2 (two) times daily as needed for pain. 06/12/22 07/12/22 Yes Gillis Santa, MD  amLODIPine-Valsartan-HCTZ [id number] MG TABS One tablet by mouth once daily 04/30/22   Steele Sizer, MD  aspirin EC 81 MG tablet Take 1 tablet (81 mg total) by mouth daily.  08/13/17   Steele Sizer, MD  atorvastatin (LIPITOR) 20 MG tablet Take 1 tablet (20 mg total) by mouth daily. 04/30/22   Steele Sizer, MD  linaclotide Rolan Lipa) 72 MCG capsule Take 1 capsule (72 mcg total) by mouth daily before breakfast. 04/30/22   Steele Sizer, MD  naloxone [facility]) 2 MG/2ML injection Inject 1 mL (1 mg total) into the muscle as needed for up to 2 doses (for opioid overdose). Inject content of syringe into thigh muscle. Call 911. 10/31/19   Gillis Santa, MD  omega-3 acid ethyl esters (LOVAZA) 1 g capsule Take 2 capsules (2 g total) by mouth 2 (two) times daily. 04/30/22   Steele Sizer, MD  oxyCODONE (OXYCONTIN) 20 mg 12 hr tablet Take 1 tablet (20 mg total) by mouth every 12 (twelve) hours. Months last 30 days. 07/12/22 08/11/22  Gillis Santa, MD  oxyCODONE-acetaminophen (PERCOCET) 10-325 MG tablet Take 1 tablet by mouth 2 (two) times daily as needed for pain. 03/13/22 04/12/22  Gillis Santa, MD  oxyCODONE-acetaminophen (PERCOCET) 10-325 MG tablet Take 1 tablet by mouth 2 (two) times daily as needed for pain. 04/12/22 05/12/22  Gillis Santa, MD  oxyCODONE-acetaminophen (PERCOCET) 10-325 MG tablet Take 1 tablet by mouth 2 (two) times daily as needed for pain. 05/13/22 06/12/22  Gillis Santa, MD  oxyCODONE-acetaminophen (PERCOCET) 10-325 MG tablet Take 1 tablet by mouth 2 (two) times daily as needed for  pain. 07/12/22 08/11/22  Gillis Santa, MD  polyethylene glycol powder (GLYCOLAX/MIRALAX) 17 GM/SCOOP powder Take 17 g by mouth daily. 04/30/22   Steele Sizer, MD  Semaglutide, 1 MG/DOSE, 4 MG/3ML SOPN Inject 1 mg as directed once a week. 04/30/22   Steele Sizer, MD  sildenafil (VIAGRA) 100 MG tablet Take 1 tablet (100 mg total) by mouth daily as needed for erectile dysfunction. 07/28/18   Steele Sizer, MD  tamsulosin (FLOMAX) 0.4 MG CAPS capsule Take 1 capsule (0.4 mg total) by mouth daily. 04/30/22   Steele Sizer, MD  tiZANidine (ZANAFLEX) 4 MG tablet Take 1 tablet (4 mg total) by  mouth 2 (two) times daily as needed for muscle spasms. 04/21/22 04/21/23  Gillis Santa, MD    Allergies as of 05/07/2022   (No Known Allergies)    Family History  Problem Relation Age of Onset   Cancer Mother        Throat   Heart attack Mother    Hypertension Father    Hyperlipidemia Father     Social History   Socioeconomic History   Marital status: Married    Spouse name: Manuela Schwartz   Number of children: 0   Years of education: Not on file   Highest education level: Not on file  Occupational History   Occupation: mill write work   Tobacco Use   Smoking status: Former    Packs/day: 1.50    Years: 20.00    Additional pack years: 0.00    Total pack years: 30.00    Types: Cigarettes    Quit date: 04/07/1999    Years since quitting: 23.2   Smokeless tobacco: Never   Tobacco comments:    not smoking   Vaping Use   Vaping Use: Never used  Substance and Sexual Activity   Alcohol use: No   Drug use: No   Sexual activity: Yes    Birth control/protection: Condom  Other Topics Concern   Not on file  Social History Narrative   Not on file   Social Determinants of Health   Financial Resource Strain: Low Risk  (10/06/2021)   Overall Financial Resource Strain (CARDIA)    Difficulty of Paying Living Expenses: Not hard at all  Food Insecurity: No Food Insecurity (10/06/2021)   Hunger Vital Sign    Worried About Running Out of Food in the Last Year: Never true    [place] in the Last Year: Never true  Transportation Needs: No Transportation Needs (10/06/2021)   PRAPARE - Hydrologist (Medical): No    Lack of Transportation (Non-Medical): No  Physical Activity: Insufficiently Active (10/06/2021)   Exercise Vital Sign    Days of Exercise per Week: 3 days    Minutes of Exercise per Session: 20 min  Stress: No Stress Concern Present (10/06/2021)   [place]    Feeling of Stress : Only  a little  Social Connections: Moderately Isolated (10/06/2021)   Social Connection and Isolation Panel [NHANES]    Frequency of Communication with Friends and Family: More than three times a week    Frequency of Social Gatherings with Friends and Family: Once a week    Attends Religious Services: Never    Marine scientist or Organizations: No    Attends Archivist Meetings: Never    Marital Status: Married  Human resources officer Violence: Not At Risk (10/06/2021)   Humiliation, Afraid, Rape, and Kick  questionnaire    Fear of Current or Ex-Partner: No    Emotionally Abused: No    Physically Abused: No    Sexually Abused: No    Review of Systems: See HPI, otherwise negative ROS  Physical Exam: BP 135/68   Pulse 72   Temp (!) 97.5 F (36.4 C) (Temporal)   Resp 18   Ht 6' (1.829 m)   Wt 107 kg   SpO2 99%   BMI 32.01 kg/m  General:   Alert,  pleasant and cooperative in NAD Head:  Normocephalic and atraumatic. Neck:  Supple; no masses or thyromegaly. Lungs:  Clear throughout to auscultation.    Heart:  Regular rate and rhythm. Abdomen:  Soft, nontender and nondistended. Normal bowel sounds, without guarding, and without rebound.   Neurologic:  Alert and  oriented x4;  grossly normal neurologically.  Impression/Plan: Lawrence Glimpse. is here for an colonoscopy to be performed for a history of adenomatous polyps on 2019    Risks, benefits, limitations, and alternatives regarding  colonoscopy have been reviewed with the patient.  Questions have been answered.  All parties agreeable.   Lucilla Lame, MD  06/30/2022, 9:08 AM

## 2022-06-30 NOTE — Op Note (Signed)
[facility] Gastroenterology Patient Name: Lawrence Thornton Procedure Date: 06/30/2022 9:05 AM MRN: [id number] Account #: [id number] Date of Birth: 15-Nov-1955 Admit Type: Outpatient Age: 67 Room: [facility] ENDO ROOM 4 Gender: Male Note Status: Finalized Instrument Name: Jasper Riling [id number] Procedure:             Colonoscopy Indications:           High risk colon cancer surveillance: Personal history                         of colonic polyps Providers:             Lucilla Lame MD, MD Medicines:             Propofol per Anesthesia Complications:         No immediate complications. Procedure:             Pre-Anesthesia Assessment:                        - Prior to the procedure, a History and Physical was                         performed, and patient medications and allergies were                         reviewed. The patient's tolerance of previous                         anesthesia was also reviewed. The risks and benefits                         of the procedure and the sedation options and risks                         were discussed with the patient. All questions were                         answered, and informed consent was obtained. Prior                         Anticoagulants: The patient has taken no anticoagulant                         or antiplatelet agents. ASA Grade Assessment: II - A                         patient with mild systemic disease. After reviewing                         the risks and benefits, the patient was deemed in                         satisfactory condition to undergo the procedure.                        After obtaining informed consent, the colonoscope was                         passed under direct vision. Throughout  the procedure,                         the patient's blood pressure, pulse, and oxygen                         saturations were monitored continuously. The                         Colonoscope was introduced through  the anus and                         advanced to the the cecum, identified by appendiceal                         orifice and ileocecal valve. The colonoscopy was                         performed without difficulty. The patient tolerated                         the procedure well. The quality of the bowel                         preparation was excellent. Findings:      The perianal and digital rectal examinations were normal.      A few small-mouthed diverticula were found in the sigmoid colon.      Non-bleeding internal hemorrhoids were found during retroflexion. The       hemorrhoids were Grade I (internal hemorrhoids that do not prolapse). Impression:            - Diverticulosis in the sigmoid colon.                        - Non-bleeding internal hemorrhoids.                        - No specimens collected. Recommendation:        - Discharge patient to home.                        - Resume previous diet.                        - Continue present medications.                        - Repeat colonoscopy in 7 years for surveillance. Procedure Code(s):     --- Professional ---                        [phone], Colonoscopy, flexible; diagnostic, including                         collection of specimen(s) by brushing or washing, when                         performed (separate procedure) Diagnosis Code(s):     --- Professional ---  Z86.010, Personal history of colonic polyps CPT copyright 2022 American Medical Association. All rights reserved. The codes documented in this report are preliminary and upon coder review may  be revised to meet current compliance requirements. Lucilla Lame MD, MD 06/30/2022 9:43:41 AM This report has been signed electronically. Number of Addenda: 0 Note Initiated On: 06/30/2022 9:05 AM Scope Withdrawal Time: 0 hours 9 minutes 21 seconds  Total Procedure Duration: 0 hours 14 minutes 14 seconds  Estimated Blood Loss:  Estimated blood loss:  none.      [facility]

## 2022-07-02 ENCOUNTER — Encounter: Payer: Self-pay | Admitting: Gastroenterology

## 2022-07-09 ENCOUNTER — Other Ambulatory Visit: Payer: Self-pay

## 2022-07-09 MED ORDER — SEMAGLUTIDE (1 MG/DOSE) 4 MG/3ML ~~LOC~~ SOPN: 1.0000 mg | PEN_INJECTOR | SUBCUTANEOUS | 0 refills | Status: DC

## 2022-07-23 ENCOUNTER — Ambulatory Visit
Payer: Medicare Other | Attending: Student in an Organized Health Care Education/Training Program | Admitting: Student in an Organized Health Care Education/Training Program

## 2022-07-23 ENCOUNTER — Encounter: Payer: Self-pay | Admitting: Student in an Organized Health Care Education/Training Program

## 2022-07-23 VITALS — BP 119/78 | HR 76 | Temp 98.2°F | Resp 17 | Ht 72.0 in | Wt 240.0 lb

## 2022-07-23 DIAGNOSIS — M48062 Spinal stenosis, lumbar region with neurogenic claudication: Secondary | ICD-10-CM | POA: Diagnosis not present

## 2022-07-23 DIAGNOSIS — Z79891 Long term (current) use of opiate analgesic: Secondary | ICD-10-CM | POA: Diagnosis not present

## 2022-07-23 DIAGNOSIS — G8929 Other chronic pain: Secondary | ICD-10-CM | POA: Insufficient documentation

## 2022-07-23 DIAGNOSIS — M5410 Radiculopathy, site unspecified: Secondary | ICD-10-CM | POA: Insufficient documentation

## 2022-07-23 DIAGNOSIS — M545 Low back pain, unspecified: Secondary | ICD-10-CM | POA: Insufficient documentation

## 2022-07-23 DIAGNOSIS — Z981 Arthrodesis status: Secondary | ICD-10-CM | POA: Insufficient documentation

## 2022-07-23 DIAGNOSIS — G894 Chronic pain syndrome: Secondary | ICD-10-CM | POA: Insufficient documentation

## 2022-07-23 MED ORDER — OXYCODONE-ACETAMINOPHEN 10-325 MG PO TABS: 1.0000 | ORAL_TABLET | Freq: Two times a day (BID) | ORAL | 0 refills | Status: DC | PRN

## 2022-07-23 MED ORDER — OXYCODONE HCL ER 20 MG PO T12A: 20.0000 mg | EXTENDED_RELEASE_TABLET | Freq: Two times a day (BID) | ORAL | 0 refills | Status: DC

## 2022-07-23 MED ORDER — OXYCODONE HCL ER 20 MG PO T12A: 20.0000 mg | EXTENDED_RELEASE_TABLET | Freq: Two times a day (BID) | ORAL | 0 refills | Status: AC

## 2022-07-23 NOTE — Progress Notes (Signed)
Nursing Pain Medication Assessment:  Safety precautions to be maintained throughout the outpatient stay will include: orient to surroundings, keep bed in low position, maintain call bell within reach at all times, provide assistance with transfer out of bed and ambulation.  Medication Inspection Compliance: Pill count conducted under aseptic conditions, in front of the patient. Neither the pills nor the bottle was removed from the patient's sight at any time. Once count was completed pills were immediately returned to the patient in their original bottle.  Medication #1: Oxycodone ER (OxyContin) Pill/Patch Count:  39 of 60 pills remain Pill/Patch Appearance: Markings consistent with prescribed medication Bottle Appearance: Standard pharmacy container. Clearly labeled. Filled Date: 04 / 08 / 2024 Last Medication intake:  Today  Medication #2: Oxycodone IR Pill/Patch Count:  39 of 60 pills remain Pill/Patch Appearance: Markings consistent with prescribed medication Bottle Appearance: Standard pharmacy container. Clearly labeled. Filled Date: 04 / 08 / 2024 Last Medication intake:  Today

## 2022-07-23 NOTE — Progress Notes (Addendum)
PROVIDER NOTE: Information contained herein reflects review and annotations entered in association with encounter. Interpretation of such information and data should be left to medically-trained personnel. Information provided to patient can be located elsewhere in the medical record under "Patient Instructions". Document created using STT-dictation technology, any transcriptional errors that may result from process are unintentional.    Patient: Lawrence Thornton.  Service Category: E/M  Provider: Edward Jolly, MD  DOB: [birth year]  DOS: 07/23/2022  Referring Provider: Alba Cory, MD  MRN: [id number]  Specialty: Interventional Pain Management  PCP: Alba Cory, MD  Type: Established Patient  Setting: Ambulatory outpatient    Location: Office  Delivery: Face-to-face     HPI  Mr. Rayan Ines., a 67 y.o. year old male, is here today because of his Spinal stenosis, lumbar region, with neurogenic claudication [M48.062]. Mr. Hands primary complain today is Back Pain (Low and right) Last encounter: My last encounter with him was on 05/20/22 Pertinent problems: Mr. Preece has Chronic right-sided low back pain without sciatica; Continuous opioid dependence (HCC); S/P knee replacement; S/P lumbar fusion; Osteoarthritis of acromioclavicular joint; Long term current use of opiate analgesic; Lumbar spondylosis; Morbid obesity; and Chronic use of opiate for therapeutic purpose on their pertinent problem list. Pain Assessment: Severity of Chronic pain is reported as a 5 /10. Location: Back Lower/denies. Onset: More than a month ago. Quality: Dull, Aching. Timing: Intermittent. Modifying factor(s): medications, ice. Vitals:  height is 6' (1.829 m) and weight is 240 lb (108.9 kg). His temporal temperature is 98.2 F (36.8 C). His blood pressure is 119/78 and his pulse is 76. His respiration is 17 and oxygen saturation is 98%.  BMI: Estimated body mass index is 32.55 kg/m as calculated  from the following:   Height as of this encounter: 6' (1.829 m).   Weight as of this encounter: 240 lb (108.9 kg).  Reason for encounter: medication management  Has lost approx 50 lbs with Ozempic  No recent radicular pain flares States that he is dealing with more pain as the duration of pain relief that he experiences is not as long He was weaned from a total MME >130 to 90 now.   Pharmacotherapy Assessment  Analgesic: 07/13/2022 04/21/2022  1 Oxycontin Er 20 Mg Tablet 60.00 30 Bi Lat 132440 [facility] (0252) 0/0 60.00 MME Medicare [place]  07/13/2022 04/21/2022  1 Oxycodone-Acetaminophen 10-325 60.00 30 Bi Lat [id number] [facility] (0252) 0/0 30.00 MME Medicare [place]    Monitoring: [place] PMP: PDMP reviewed during this encounter.       Pharmacotherapy: No side-effects or adverse reactions reported. Compliance: No problems identified. Effectiveness: Clinically acceptable.  Rickey Barbara, RN  07/23/2022  1:11 PM  Sign when Signing Visit Nursing Pain Medication Assessment:  Safety precautions to be maintained throughout the outpatient stay will include: orient to surroundings, keep bed in low position, maintain call bell within reach at all times, provide assistance with transfer out of bed and ambulation.  Medication Inspection Compliance: Pill count conducted under aseptic conditions, in front of the patient. Neither the pills nor the bottle was removed from the patient's sight at any time. Once count was completed pills were immediately returned to the patient in their original bottle.  Medication #1: Oxycodone ER (OxyContin) Pill/Patch Count:  39 of 60 pills remain Pill/Patch Appearance: Markings consistent with prescribed medication Bottle Appearance: Standard pharmacy container. Clearly labeled. Filled Date: 04 / 08 / 2024 Last Medication intake:  Today  Medication #2: Oxycodone IR Pill/Patch Count:  39 of 60 pills remain Pill/Patch Appearance: Markings consistent with prescribed  medication Bottle Appearance: Standard pharmacy container. Clearly labeled. Filled Date: 04 / 08 / 2024 Last Medication intake:  Today    No results found for: "CBDTHCR" No results found for: "D8THCCBX" No results found for: "D9THCCBX"  UDS:  Summary  Date Value Ref Range Status  07/29/2021 Note  Final    Comment:    ==================================================================== [facility] Select 13 (MW) ==================================================================== Test                             Result       Flag       Units  Drug Present and Declared for Prescription Verification   Oxycodone                      3407         EXPECTED   ng/mg creat   Oxymorphone                    3352         EXPECTED   ng/mg creat   Noroxycodone                   3414         EXPECTED   ng/mg creat   Noroxymorphone                 1116         EXPECTED   ng/mg creat    Sources of oxycodone are scheduled prescription medications.    Oxymorphone, noroxycodone, and noroxymorphone are expected    metabolites of oxycodone. Oxymorphone is also available as a    scheduled prescription medication.  ==================================================================== Test                      Result    Flag   Units      Ref Range   Creatinine              182              mg/dL      >=16 ==================================================================== Declared Medications:  The flagging and interpretation on this report are based on the  following declared medications.  Unexpected results may arise from  inaccuracies in the declared medications.   **Note: The testing scope of this panel includes these medications:   Oxycodone (Oxycontin)  Oxycodone (Percocet)   **Note: The testing scope of this panel does not include the  following reported medications:   Acetaminophen (Percocet)  Amlodipine  Aspirin  Atorvastatin (Lipitor)  Hydrochlorothiazide  Linaclotide (Linzess)   Metformin (Glucophage)  Naloxone (Narcan)  Omega-3 Fatty Acids  Sildenafil (Viagra)  Tamsulosin (Flomax)  Tizanidine (Zanaflex)  Valsartan ==================================================================== For clinical consultation, please call [phone]. ====================================================================       ROS  Constitutional: Denies any fever or chills Gastrointestinal: No reported hemesis, hematochezia, vomiting, or acute GI distress Musculoskeletal:  Low back, left leg pain Neurological: No reported episodes of acute onset apraxia, aphasia, dysarthria, agnosia, amnesia, paralysis, loss of coordination, or loss of consciousness  Medication Review  Semaglutide (1 MG/DOSE), amLODIPine-Valsartan-HCTZ, aspirin EC, atorvastatin, linaclotide, naloxone, omega-3 acid ethyl esters, oxyCODONE, oxyCODONE-acetaminophen, polyethylene glycol powder, sildenafil, tamsulosin, and tiZANidine  History Review  Allergy: Mr. Detjen has No Known Allergies. Drug: Mr. Stanco  reports no history of drug use. Alcohol:  reports  no history of alcohol use. Tobacco:  reports that he quit smoking about 23 years ago. His smoking use included cigarettes. He has a 30.00 pack-year smoking history. He has never used smokeless tobacco. Social: Mr. Nestle  reports that he quit smoking about 23 years ago. His smoking use included cigarettes. He has a 30.00 pack-year smoking history. He has never used smokeless tobacco. He reports that he does not drink alcohol and does not use drugs. Medical:  has a past medical history of Arthritis and Hypertension. Surgical: Mr. Fulghum  has a past surgical history that includes Back surgery (lower back surgery x 3 ); Appendectomy (age 23); sebaceous cyst removed (Right, 15 years ago); Toe Fusion (Right); metal removed from eye (years ago); Total knee arthroplasty (Left, 08/10/2014); Total knee arthroplasty (Right, 12/21/2014); Total knee arthroplasty  (Right, 12/21/14); Lumbar fusion (1999); Colonoscopy with propofol (N/A, 05/04/2017); arthroscopic shoulder surgery (Right, 08/31/2017); and Colonoscopy with propofol (N/A, 06/30/2022). Family: family history includes Cancer in his mother; Heart attack in his mother; Hyperlipidemia in his father; Hypertension in his father.  Laboratory Chemistry Profile   Renal Lab Results  Component Value Date   BUN 13 04/30/2022   CREATININE 0.84 04/30/2022   LABCREA 274 04/30/2022   BCR SEE NOTE: 04/30/2022   GFRAA 108 05/03/2020   GFRNONAA 93 05/03/2020    Hepatic Lab Results  Component Value Date   AST 20 04/30/2022   ALT 23 04/30/2022   ALBUMIN 4.6 11/11/2015   ALKPHOS 76 11/11/2015   HCVAB NEGATIVE 10/06/2016    Electrolytes Lab Results  Component Value Date   NA 137 04/30/2022   K 3.9 04/30/2022   CL 99 04/30/2022   CALCIUM 9.7 04/30/2022    Bone Lab Results  Component Value Date   VD25OH 43 10/06/2016    Inflammation (CRP: Acute Phase) (ESR: Chronic Phase) No results found for: "CRP", "ESRSEDRATE", "LATICACIDVEN"       Note: Above Lab results reviewed.  Recent Imaging Review  MR LUMBAR SPINE W WO CONTRAST CLINICAL DATA:  67 year old male with persistent low back pain. Left leg radicular pain. Prior surgery.  EXAM: MRI LUMBAR SPINE WITHOUT AND WITH CONTRAST  TECHNIQUE: Multiplanar and multiecho pulse sequences of the lumbar spine were obtained without and with intravenous contrast.  CONTRAST:  10mL GADAVIST GADOBUTROL 1 MMOL/ML IV SOLN  COMPARISON:  Lumbar radiographs 03/16/2013.  FINDINGS: Segmentation:  Normal on the comparison.  Alignment: Straightening of lumbar lordosis not significantly changed since 2014. No significant scoliosis or spondylolisthesis.  Vertebrae: Abundant susceptibility artifact associated with chronic posterior spinal fusion hardware L3 through the sacrum.  Evidence of solid arthrodesis at L4-L5 and L5-S1, and possibly at the posterior  elements of L3-L4. Advanced mostly chronic appearing degenerative endplate marrow changes at L1-L2, see details below. Background bone marrow signal within normal limits. No definite No acute osseous abnormality identified.  Conus medullaris and cauda equina: Conus extends to the L1, extending toward the L1-L2 disc level. Degenerative conus mass effect (series 5, image 10), but no lower spinal cord or conus signal abnormality.  No abnormal intradural enhancement or dural thickening is evident.  Paraspinal and other soft tissues: Postoperative changes to the posterior lumbar paraspinal soft tissues. Negative visible abdominal viscera.  Disc levels:  T12-L1: Some disc space loss with bulky circumferential disc osteophyte complex. Broad-based posterior component and mild to moderate facet and ligament flavum hypertrophy greater on the left. Mild spinal stenosis, with up to mild mass effect on the lower thoracic spinal cord here (  series 5, image 10). Mild left lateral recess stenosis (left L1 nerve level). And moderate to severe multifactorial left T12 foraminal stenosis (series 5, image 12).  L1-L2: Severe disc space loss, bulky circumferential disc osteophyte complex. Broad-based posterior component. Relatively mild facet and ligament flavum hypertrophy. Some epidural lipomatosis. Severe spinal stenosis (series 8, image 9) with some mass effect on the conus. Moderate to severe bilateral L1 foraminal stenosis.  L2-L3: Better preserved disc space, but circumferential disc osteophyte complex with a bulky and broad-based posterior component eccentric to the left. Partially visible at least moderate facet and ligament flavum hypertrophy with some epidural lipomatosis.  Severe spinal stenosis, with lateral recess stenosis greater on the left (series 8, image 18, descending left L3 nerve level). Mild to moderate L2 foraminal stenosis greater on the left.  L3-L4:  Prior decompression  and fusion.  No adverse features.  L4-L5: Prior decompression and fusion. Evidence of solid arthrodesis, no adverse features.  L5-S1: Prior decompression and fusion. Evidence of solid arthrodesis. No adverse features.  IMPRESSION: 1. Prior decompression and fusion from L3-L4 through L5-S1 with no adverse features.  2. But bulky disc and endplate degeneration elsewhere T12-L1 through the adjacent L2-L3 segment. Subsequent moderate to severe multifactorial spinal stenosis at L1-L2 (level of the conus), and L2-L3 (left lateral recess more affected). Mild spinal stenosis at T12-L1. Associated mass effect on the lower thoracic spinal cord and conus, but no cord/conus signal abnormality.  3. Associated moderate to severe left T12 and bilateral L1 neural foraminal stenosis. Up to moderate left L2 foraminal stenosis.  Electronically Signed   By: Odessa Fleming M.D.   On: 05/02/2022 08:00 Note: Reviewed        Physical Exam  General appearance: Well nourished, well developed, and well hydrated. In no apparent acute distress Mental status: Alert, oriented x 3 (person, place, & time)       Respiratory: No evidence of acute respiratory distress Eyes: PERLA Vitals: BP 119/78   Pulse 76   Temp 98.2 F (36.8 C) (Temporal)   Resp 17   Ht 6' (1.829 m)   Wt 240 lb (108.9 kg)   SpO2 98%   BMI 32.55 kg/m  BMI: Estimated body mass index is 32.55 kg/m as calculated from the following:   Height as of this encounter: 6' (1.829 m).   Weight as of this encounter: 240 lb (108.9 kg). Ideal: Ideal body weight: 77.6 kg (171 lb 1.2 oz) Adjusted ideal body weight: 90.1 kg (198 lb 10.3 oz)  Lumbar Spine Area Exam  Skin & Axial Inspection: Well healed scar from previous spine surgery detected Alignment: Symmetrical Functional ROM: Pain restricted ROM affecting both sides Stability: No instability detected Muscle Tone/Strength: Functionally intact. No obvious neuro-muscular anomalies detected. Sensory  (Neurological): Dermatomal with radiation into left buttock and left lateral thigh   Gait & Posture Assessment  Ambulation: Unassisted Gait: Relatively normal for age and body habitus Posture: WNL  Lower Extremity Exam      Side: Right lower extremity   Side: Left lower extremity  Stability: No instability observed           Stability: No instability observed          Skin & Extremity Inspection: Skin color, temperature, and hair growth are WNL. No peripheral edema or cyanosis. No masses, redness, swelling, asymmetry, or associated skin lesions. No contractures.   Skin & Extremity Inspection: Skin color, temperature, and hair growth are WNL. No peripheral edema or cyanosis. No masses, redness,  swelling, asymmetry, or associated skin lesions. No contractures.  Functional ROM: Unrestricted ROM                   Functional ROM: Unrestricted ROM                  Muscle Tone/Strength: Functionally intact. No obvious neuro-muscular anomalies detected.   Muscle Tone/Strength: Functionally intact. No obvious neuro-muscular anomalies detected.  Sensory (Neurological): Unimpaired         Sensory (Neurological): Dermatomal       DTR: Patellar: deferred today Achilles: deferred today Plantar: deferred today   DTR: Patellar: deferred today Achilles: deferred today Plantar: deferred today  Palpation: No palpable anomalies   Palpation: No palpable anomalies    Assessment   Diagnosis Status  1. Spinal stenosis, lumbar region, with neurogenic claudication   2. Chronic pain syndrome   3. Chronic use of opiate for therapeutic purpose   4. S/P lumbar fusion   5. Radicular pain of left lower extremity   6. Chronic right-sided low back pain without sciatica     Controlled Controlled Controlled     Plan of Care   1. Chronic pain syndrome - oxyCODONE-acetaminophen (PERCOCET) 10-325 MG tablet; Take 1 tablet by mouth 2 (two) times daily as needed for pain.  Dispense: 60 tablet; Refill: 0 -  oxyCODONE-acetaminophen (PERCOCET) 10-325 MG tablet; Take 1 tablet by mouth 2 (two) times daily as needed for pain.  Dispense: 60 tablet; Refill: 0 - oxyCODONE-acetaminophen (PERCOCET) 10-325 MG tablet; Take 1 tablet by mouth 2 (two) times daily as needed for pain.  Dispense: 60 tablet; Refill: 0 - ToxASSURE Select 13 (MW), Urine  2. Chronic use of opiate for therapeutic purpose - oxyCODONE-acetaminophen (PERCOCET) 10-325 MG tablet; Take 1 tablet by mouth 2 (two) times daily as needed for pain.  Dispense: 60 tablet; Refill: 0 - oxyCODONE-acetaminophen (PERCOCET) 10-325 MG tablet; Take 1 tablet by mouth 2 (two) times daily as needed for pain.  Dispense: 60 tablet; Refill: 0 - oxyCODONE-acetaminophen (PERCOCET) 10-325 MG tablet; Take 1 tablet by mouth 2 (two) times daily as needed for pain.  Dispense: 60 tablet; Refill: 0 - ToxASSURE Select 13 (MW), Urine  3. Spinal stenosis, lumbar region, with neurogenic claudication  4. S/P lumbar fusion  5. Radicular pain of left lower extremity  6. Chronic right-sided low back pain without sciatica  Have discussed spinal injections and spinal cord stimulator for chronic pain management Discussed the importance of not escalating opioid medications due to opioid induced hyperalgesia and central sensitization syndrome    Follow-up plan:   Return in about 4 months (around 11/10/2022) for Medication Management, in person.    Recent Visits Date Type Provider Dept  05/20/22 Office Visit Edward Jolly, MD [facility]  Showing recent visits within past 90 days and meeting all other requirements Today's Visits Date Type Provider Dept  07/23/22 Office Visit Edward Jolly, MD [facility]  Showing today's visits and meeting all other requirements Future Appointments No visits were found meeting these conditions. Showing future appointments within next 90 days and meeting all other requirements  I discussed the assessment and treatment  plan with the patient. The patient was provided an opportunity to ask questions and all were answered. The patient agreed with the plan and demonstrated an understanding of the instructions.  Patient advised to call back or seek an in-person evaluation if the symptoms or condition worsens.  Duration of encounter: .  Total time on encounter, as  per AMA guidelines included both the face-to-face and non-face-to-face time personally spent by the physician and/or other qualified health care professional(s) on the day of the encounter (includes time in activities that require the physician or other qualified health care professional and does not include time in activities normally performed by clinical staff). Physician's time may include the following activities when performed: Preparing to see the patient (e.g., pre-charting review of records, searching for previously ordered imaging, lab work, and nerve conduction tests) Review of prior analgesic pharmacotherapies. Reviewing PMP Interpreting ordered tests (e.g., lab work, imaging, nerve conduction tests) Performing post-procedure evaluations, including interpretation of diagnostic procedures Obtaining and/or reviewing separately obtained history Performing a medically appropriate examination and/or evaluation Counseling and educating the patient/family/caregiver Ordering medications, tests, or procedures Referring and communicating with other health care professionals (when not separately reported) Documenting clinical information in the electronic or other health record Independently interpreting results (not separately reported) and communicating results to the patient/ family/caregiver Care coordination (not separately reported)  Note by: Edward Jolly, MD Date: 07/23/2022; Time: 1:40 PM

## 2022-07-27 ENCOUNTER — Telehealth: Payer: Self-pay | Admitting: Family Medicine

## 2022-07-27 NOTE — Telephone Encounter (Signed)
Contacted Lillia Pauls. to schedule their annual wellness visit. Appointment made for 07/30/2022.  [facility] Care Guide [facility] AWV TEAM Direct Dial: [phone]

## 2022-07-27 NOTE — Telephone Encounter (Signed)
Contacted Lawrence Thornton. to schedule their annual wellness visit. Appointment made for 07/30/2022.  Lawrence Thornton Care Guide [facility] AWV TEAM Direct Dial: [phone]   

## 2022-07-30 ENCOUNTER — Ambulatory Visit (INDEPENDENT_AMBULATORY_CARE_PROVIDER_SITE_OTHER): Payer: Medicare Other

## 2022-07-30 ENCOUNTER — Encounter: Payer: Medicare Other | Admitting: Student in an Organized Health Care Education/Training Program

## 2022-07-30 VITALS — Ht 72.0 in | Wt 240.0 lb

## 2022-07-30 DIAGNOSIS — Z0000 Encounter for general adult medical examination without abnormal findings: Secondary | ICD-10-CM | POA: Diagnosis not present

## 2022-07-30 LAB — TOXASSURE SELECT 13 (MW), URINE

## 2022-07-30 NOTE — Patient Instructions (Signed)
[name] , Thank you for taking time to come for your Medicare Wellness Visit. I appreciate your ongoing commitment to your health goals. Please review the following plan we discussed and let me know if I can assist you in the future.   These are the goals we discussed:  Goals      Patient Stated     Keep health from getting worse        This is a list of the screening recommended for you and due dates:  Health Maintenance  Topic Date Due   COVID-19 Vaccine (5 - 2023-24 season) 12/05/2021   Complete foot exam   05/05/2022   Hemoglobin A1C  10/29/2022   Flu Shot  11/05/2022   Eye exam for diabetics  11/12/2022   Yearly kidney function blood test for diabetes  05/01/2023   Yearly kidney health urinalysis for diabetes  05/01/2023   Medicare Annual Wellness Visit  07/30/2023   Colon Cancer Screening  06/30/2027   DTaP/Tdap/Td vaccine (3 - Td or Tdap) 11/01/2031   Pneumonia Vaccine  Completed   Hepatitis C Screening: USPSTF Recommendation to screen - Ages 70-79 yo.  Completed   Zoster (Shingles) Vaccine  Completed   HPV Vaccine  Aged Out    Advanced directives:   Discussed with patient  Conditions/risks identified: Keep up the good work  Next appointment: Follow up in one year for your annual wellness visit.   08/05/2023  Preventive Care 65 Years and Older, Male  Preventive care refers to lifestyle choices and visits with your health care provider that can promote health and wellness. What does preventive care include? A yearly physical exam. This is also called an annual well check. Dental exams once or twice a year. Routine eye exams. Ask your health care provider how often you should have your eyes checked. Personal lifestyle choices, including: Daily care of your teeth and gums. Regular physical activity. Eating a healthy diet. Avoiding tobacco and drug use. Limiting alcohol use. Practicing safe sex. Taking low doses of aspirin every day. Taking vitamin and  mineral supplements as recommended by your health care provider. What happens during an annual well check? The services and screenings done by your health care provider during your annual well check will depend on your age, overall health, lifestyle risk factors, and family history of disease. Counseling  Your health care provider may ask you questions about your: Alcohol use. Tobacco use. Drug use. Emotional well-being. Home and relationship well-being. Sexual activity. Eating habits. History of falls. Memory and ability to understand (cognition). Work and work Astronomer. Screening  You may have the following tests or measurements: Height, weight, and BMI. Blood pressure. Lipid and cholesterol levels. These may be checked every 5 years, or more frequently if you are over 67 years old. Skin check. Lung cancer screening. You may have this screening every year starting at age 59 if you have a 30-pack-year history of smoking and currently smoke or have quit within the past 15 years. Fecal occult blood test (FOBT) of the stool. You may have this test every year starting at age 59. Flexible sigmoidoscopy or colonoscopy. You may have a sigmoidoscopy every 5 years or a colonoscopy every 10 years starting at age 67. Prostate cancer screening. Recommendations will vary depending on your family history and other risks. Hepatitis C blood test. Hepatitis B blood test. Sexually transmitted disease (STD) testing. Diabetes screening. This is done by checking your blood sugar (glucose) after you have not eaten for  a while (fasting). You may have this done every 1-3 years. Abdominal aortic aneurysm (AAA) screening. You may need this if you are a current or former smoker. Osteoporosis. You may be screened starting at age 51 if you are at high risk. Talk with your health care provider about your test results, treatment options, and if necessary, the need for more tests. Vaccines  Your health care  provider may recommend certain vaccines, such as: Influenza vaccine. This is recommended every year. Tetanus, diphtheria, and acellular pertussis (Tdap, Td) vaccine. You may need a Td booster every 10 years. Zoster vaccine. You may need this after age 43. Pneumococcal 13-valent conjugate (PCV13) vaccine. One dose is recommended after age 34. Pneumococcal polysaccharide (PPSV23) vaccine. One dose is recommended after age 37. Talk to your health care provider about which screenings and vaccines you need and how often you need them. This information is not intended to replace advice given to you by your health care provider. Make sure you discuss any questions you have with your health care provider. Document Released: 04/19/2015 Document Revised: 12/11/2015 Document Reviewed: 01/22/2015 Elsevier Interactive Patient Education  2017 [place] Prevention in the Home Falls can cause injuries. They can happen to people of all ages. There are many things you can do to make your home safe and to help prevent falls. What can I do on the outside of my home? Regularly fix the edges of walkways and driveways and fix any cracks. Remove anything that might make you trip as you walk through a door, such as a raised step or threshold. Trim any bushes or trees on the path to your home. Use bright outdoor lighting. Clear any walking paths of anything that might make someone trip, such as rocks or tools. Regularly check to see if handrails are loose or broken. Make sure that both sides of any steps have handrails. Any raised decks and porches should have guardrails on the edges. Have any leaves, snow, or ice cleared regularly. Use sand or salt on walking paths during winter. Clean up any spills in your garage right away. This includes oil or grease spills. What can I do in the bathroom? Use night lights. Install grab bars by the toilet and in the tub and shower. Do not use towel bars as grab  bars. Use non-skid mats or decals in the tub or shower. If you need to sit down in the shower, use a plastic, non-slip stool. Keep the floor dry. Clean up any water that spills on the floor as soon as it happens. Remove soap buildup in the tub or shower regularly. Attach bath mats securely with double-sided non-slip rug tape. Do not have throw rugs and other things on the floor that can make you trip. What can I do in the bedroom? Use night lights. Make sure that you have a light by your bed that is easy to reach. Do not use any sheets or blankets that are too big for your bed. They should not hang down onto the floor. Have a firm chair that has side arms. You can use this for support while you get dressed. Do not have throw rugs and other things on the floor that can make you trip. What can I do in the kitchen? Clean up any spills right away. Avoid walking on wet floors. Keep items that you use a lot in easy-to-reach places. If you need to reach something above you, use a strong step stool that has a  grab bar. Keep electrical cords out of the way. Do not use floor polish or wax that makes floors slippery. If you must use wax, use non-skid floor wax. Do not have throw rugs and other things on the floor that can make you trip. What can I do with my stairs? Do not leave any items on the stairs. Make sure that there are handrails on both sides of the stairs and use them. Fix handrails that are broken or loose. Make sure that handrails are as long as the stairways. Check any carpeting to make sure that it is firmly attached to the stairs. Fix any carpet that is loose or worn. Avoid having throw rugs at the top or bottom of the stairs. If you do have throw rugs, attach them to the floor with carpet tape. Make sure that you have a light switch at the top of the stairs and the bottom of the stairs. If you do not have them, ask someone to add them for you. What else can I do to help prevent  falls? Wear shoes that: Do not have high heels. Have rubber bottoms. Are comfortable and fit you well. Are closed at the toe. Do not wear sandals. If you use a stepladder: Make sure that it is fully opened. Do not climb a closed stepladder. Make sure that both sides of the stepladder are locked into place. Ask someone to hold it for you, if possible. Clearly mark and make sure that you can see: Any grab bars or handrails. First and last steps. Where the edge of each step is. Use tools that help you move around (mobility aids) if they are needed. These include: Canes. Walkers. Scooters. Crutches. Turn on the lights when you go into a dark area. Replace any light bulbs as soon as they burn out. Set up your furniture so you have a clear path. Avoid moving your furniture around. If any of your floors are uneven, fix them. If there are any pets around you, be aware of where they are. Review your medicines with your doctor. Some medicines can make you feel dizzy. This can increase your chance of falling. Ask your doctor what other things that you can do to help prevent falls. This information is not intended to replace advice given to you by your health care provider. Make sure you discuss any questions you have with your health care provider. Document Released: 01/17/2009 Document Revised: 08/29/2015 Document Reviewed: 04/27/2014 Elsevier Interactive Patient Education  2017 [employer].

## 2022-07-30 NOTE — Progress Notes (Signed)
Subjective:   Lawrence Mostafa. is a 67 y.o. male who presents for Medicare Annual/Subsequent preventive examination.  I connected with  Lawrence Thornton. on 07/30/22 by a audio enabled telemedicine application and verified that I am speaking with the correct person using two identifiers.  Patient Location: Home  Provider Location: Home Office  I discussed the limitations of evaluation and management by telemedicine. The patient expressed understanding and agreed to proceed.        Review of Systems    Cardiac Risk Factors include: advanced age (>103men, >96 women);diabetes mellitus;dyslipidemia;male gender;hypertension     Objective:    Today's Vitals   07/30/22 0820 07/30/22 0821  Weight: 240 lb (108.9 kg)   Height: 6' (1.829 m)   PainSc:  5    Body mass index is 32.55 kg/m.     07/30/2022    8:31 AM 06/30/2022    8:39 AM 05/20/2022    1:05 PM 01/27/2022    8:20 AM 10/30/2021    9:17 AM 07/30/2020    8:11 AM 05/07/2020    8:17 AM  Advanced Directives  Does Patient Have a Medical Advance Directive? Yes Yes Yes Yes Yes Yes Yes  Type of Estate agent of [place];Living will Living will  Healthcare Power of [place];Living will Healthcare Power of [place];Living will    Does patient want to make changes to medical advance directive?       No - Patient declined  Copy of Healthcare Power of Attorney in Chart? No - copy requested          Current Medications (verified) Outpatient Encounter Medications as of 07/30/2022  Medication Sig   amLODIPine-Valsartan-HCTZ 10-320-25 MG TABS One tablet by mouth once daily   aspirin EC 81 MG tablet Take 1 tablet (81 mg total) by mouth daily.   atorvastatin (LIPITOR) 20 MG tablet Take 1 tablet (20 mg total) by mouth daily.   linaclotide (LINZESS) 72 MCG capsule Take 1 capsule (72 mcg total) by mouth daily before breakfast.   omega-3 acid ethyl esters (LOVAZA) 1 g capsule Take 2 capsules (2 g total) by  mouth 2 (two) times daily.   [START ON 08/12/2022] oxyCODONE (OXYCONTIN) 20 mg 12 hr tablet Take 1 tablet (20 mg total) by mouth every 12 (twelve) hours. Months last 30 days.   [START ON 09/11/2022] oxyCODONE (OXYCONTIN) 20 mg 12 hr tablet Take 1 tablet (20 mg total) by mouth every 12 (twelve) hours. Months last 30 days.   [START ON 10/11/2022] oxyCODONE (OXYCONTIN) 20 mg 12 hr tablet Take 1 tablet (20 mg total) by mouth every 12 (twelve) hours. Months last 30 days.   [START ON 08/12/2022] oxyCODONE-acetaminophen (PERCOCET) 10-325 MG tablet Take 1 tablet by mouth 2 (two) times daily as needed for pain.   [START ON 09/11/2022] oxyCODONE-acetaminophen (PERCOCET) 10-325 MG tablet Take 1 tablet by mouth 2 (two) times daily as needed for pain.   [START ON 10/11/2022] oxyCODONE-acetaminophen (PERCOCET) 10-325 MG tablet Take 1 tablet by mouth 2 (two) times daily as needed for pain.   polyethylene glycol powder (GLYCOLAX/MIRALAX) 17 GM/SCOOP powder Take 17 g by mouth daily.   Semaglutide, 1 MG/DOSE, 4 MG/3ML SOPN Inject 1 mg as directed once a week.   sildenafil (VIAGRA) 100 MG tablet Take 1 tablet (100 mg total) by mouth daily as needed for erectile dysfunction.   tamsulosin (FLOMAX) 0.4 MG CAPS capsule Take 1 capsule (0.4 mg total) by mouth daily.   tiZANidine (ZANAFLEX) 4  MG tablet Take 1 tablet (4 mg total) by mouth 2 (two) times daily as needed for muscle spasms.   naloxone (NARCAN) 2 MG/2ML injection Inject 1 mL (1 mg total) into the muscle as needed for up to 2 doses (for opioid overdose). Inject content of syringe into thigh muscle. Call 911. (Patient not taking: Reported on 07/30/2022)   No facility-administered encounter medications on file as of 07/30/2022.    Allergies (verified) Patient has no known allergies.   History: Past Medical History:  Diagnosis Date   Arthritis    oa   Hypertension    Past Surgical History:  Procedure Laterality Date   APPENDECTOMY  age 58   arthroscopic shoulder  surgery Right 08/31/2017   BACK SURGERY  lower back surgery x 3    L 3 L 4 and L 5 are fused together   COLONOSCOPY WITH PROPOFOL N/A 05/04/2017   Procedure: COLONOSCOPY WITH PROPOFOL;  Surgeon: Midge Minium, MD;  Location: [facility] ENDOSCOPY;  Service: Endoscopy;  Laterality: N/A;   COLONOSCOPY WITH PROPOFOL N/A 06/30/2022   Procedure: COLONOSCOPY WITH PROPOFOL;  Surgeon: Midge Minium, MD;  Location: [facility] ENDOSCOPY;  Service: Endoscopy;  Laterality: N/A;   LUMBAR FUSION  1999   L- to L5   metal removed from eye  years ago   sebaceous cyst removed Right 15 years ago   middle  finger    TOE FUSION Right    4th toe   TOTAL KNEE ARTHROPLASTY Left 08/10/2014   Procedure: LEFT TOTAL KNEE ARTHROPLASTY;  Surgeon: Eugenia Mcalpine, MD;  Location: [facility] ORS;  Service: Orthopedics;  Laterality: Left;   TOTAL KNEE ARTHROPLASTY Right 12/21/2014   Procedure: RIGHT TOTAL KNEE ARTHROPLASTY;  Surgeon: Eugenia Mcalpine, MD;  Location: [facility];  Service: Orthopedics;  Laterality: Right;   TOTAL KNEE ARTHROPLASTY Right 12/21/14   Family History  Problem Relation Age of Onset   Cancer Mother        Throat   Heart attack Mother    Hypertension Father    Hyperlipidemia Father    Social History   Socioeconomic History   Marital status: Married    Spouse name: Darl Pikes   Number of children: 0   Years of education: Not on file   Highest education level: Not on file  Occupational History   Occupation: mill write work   Tobacco Use   Smoking status: Former    Packs/day: 1.50    Years: 20.00    Additional pack years: 0.00    Total pack years: 30.00    Types: Cigarettes    Quit date: 04/07/1999    Years since quitting: 23.3   Smokeless tobacco: Never   Tobacco comments:    not smoking   Vaping Use   Vaping Use: Never used  Substance and Sexual Activity   Alcohol use: No   Drug use: No   Sexual activity: Yes    Birth control/protection: Condom  Other Topics Concern   Not on file  Social History Narrative    Not on file   Social Determinants of Health   Financial Resource Strain: Low Risk  (10/06/2021)   Overall Financial Resource Strain (CARDIA)    Difficulty of Paying Living Expenses: Not hard at all  Food Insecurity: No Food Insecurity (07/30/2022)   Hunger Vital Sign    Worried About Running Out of Food in the Last Year: Never true    Ran Out of Food in the Last Year: Never true  Transportation Needs: No Transportation  Needs (07/30/2022)   PRAPARE - Administrator, Civil Service (Medical): No    Lack of Transportation (Non-Medical): No  Physical Activity: Inactive (07/30/2022)   Exercise Vital Sign    Days of Exercise per Week: 0 days    Minutes of Exercise per Session: 0 min  Stress: No Stress Concern Present (07/30/2022)   [employer] of Occupational Health - Occupational Stress Questionnaire    Feeling of Stress : Not at all  Social Connections: Moderately Isolated (07/30/2022)   Social Connection and Isolation Panel [NHANES]    Frequency of Communication with Friends and Family: More than three times a week    Frequency of Social Gatherings with Friends and Family: Never    Attends Religious Services: Never    Database administrator or Organizations: No    Attends Engineer, structural: Never    Marital Status: Married    Tobacco Counseling Counseling given: Not Answered Tobacco comments: not smoking    Clinical Intake:  Pre-visit preparation completed: Yes  Pain Score: 5  Pain Type: Chronic pain Pain Location: Back Pain Descriptors / Indicators: Dull, Sharp Pain Onset: More than a month ago Pain Frequency: Constant     BMI - recorded: 32.55 Nutritional Status: BMI > 30  Obese Diabetes: Yes CBG done?: No Did pt. bring in CBG monitor from home?: No  How often do you need to have someone help you when you read instructions, pamphlets, or other written materials from your doctor or pharmacy?: 1 - Never  Diabetic?    Yes  Interpreter  Needed?: No  Information entered by :: Epimenio Foot Dacoda Spallone,CMA   Activities of Daily Living    07/30/2022    8:29 AM 04/30/2022    7:53 AM  In your present state of health, do you have any difficulty performing the following activities:  Hearing? 0 1  Vision? 0 0  Difficulty concentrating or making decisions? 0 0  Walking or climbing stairs? 0 0  Dressing or bathing? 0 0  Doing errands, shopping? 0 0  Preparing Food and eating ? N   Using the Toilet? N   In the past six months, have you accidently leaked urine? N   Do you have problems with loss of bowel control? N   Managing your Medications? N   Managing your Finances? N   Housekeeping or managing your Housekeeping? N     Patient Care Team: Alba Cory, MD as PCP - General (Family Medicine) Edward Jolly, MD as Consulting Physician (Pain Medicine)  Indicate any recent Medical Services you may have received from other than [facility] providers in the past year (date may be approximate).     Assessment:   This is a routine wellness examination for [facility].  Hearing/Vision screen Hearing Screening - Comments:: Patient states does not hear as well in both ears Vision Screening - Comments:: Wears rx glasses - up to date with routine eye exams with  Dr. Larence Penning in [place]  in 2023  Dietary issues and exercise activities discussed:     Goals Addressed             This Visit's Progress    Patient Stated       Keep health from getting worse       Depression Screen    07/30/2022    8:33 AM 07/23/2022    1:06 PM 05/20/2022    1:04 PM 04/30/2022    7:53 AM 01/27/2022    8:20 AM 10/31/2021  7:43 AM 10/30/2021    9:17 AM  PHQ 2/9 Scores  PHQ - 2 Score 0 0 0 0 0 0 0  PHQ- 9 Score    0  0     Fall Risk    07/30/2022    8:25 AM 07/23/2022    1:05 PM 05/20/2022    1:04 PM 04/30/2022    7:53 AM 04/21/2022    9:53 AM  Fall Risk   Falls in the past year? 0 0 0 0 0  Number falls in past yr: 0   0   Injury with Fall? 0   0    Risk for fall due to : No Fall Risks   No Fall Risks   Follow up Falls prevention discussed   Falls prevention discussed     FALL RISK PREVENTION PERTAINING TO THE HOME:  Any stairs in or around the home? Yes  If so, are there any without handrails? No  Home free of loose throw rugs in walkways, pet beds, electrical cords, etc? No  Adequate lighting in your home to reduce risk of falls? Yes   ASSISTIVE DEVICES UTILIZED TO PREVENT FALLS:  Life alert? No  Use of a cane, walker or w/c? No  Grab bars in the bathroom? Yes  Shower chair or bench in shower? Yes  Elevated toilet seat or a handicapped toilet? No   TIMED UP AND GO:  Was the test performed? No .   Televisit         07/30/2022    8:31 AM  6CIT Screen  What Year? 0 points  What month? 0 points  What time? 0 points  Count back from 20 0 points  Months in reverse 0 points  Repeat phrase 0 points  Total Score 0 points    Immunizations Immunization History  Administered Date(s) Administered   Influenza Split 01/05/2012   Influenza, High Dose Seasonal PF 05/05/2021   Influenza, Seasonal, Injecte, Preservative Fre 12/05/2012, 12/28/2013   Influenza,inj,Quad PF,6+ Mos 02/22/2015, 01/07/2017, 01/12/2018, 01/27/2019, 01/02/2020   Influenza-Unspecified 11/12/2015, 01/27/2019, 02/16/2022   Janssen (J&J) SARS-COV-2 Vaccination 06/23/2019   Moderna SARS-COV2 Booster Vaccination 07/04/2020, 10/28/2020   Moderna Sars-Covid-2 Vaccination 01/31/2020   PNEUMOCOCCAL CONJUGATE-20 05/05/2021   Pneumococcal Polysaccharide-23 08/13/2017   Tdap 05/07/2011, 10/31/2021   Zoster Recombinat (Shingrix) 10/06/2016, 08/13/2017    TDAP status: Up to date  Flu Vaccine status: Up to date  Pneumococcal vaccine status: Up to date  Covid Vaccine.  Information given how to obtain needed vaccine  Qualifies for Shingles Vaccine? Yes   Zostavax completed Yes   Shingrix Completed?: Yes  Screening Tests Health Maintenance  Topic Date  Due   COVID-19 Vaccine (5 - 2023-24 season) 12/05/2021   FOOT EXAM  05/05/2022   HEMOGLOBIN A1C  10/29/2022   INFLUENZA VACCINE  11/05/2022   OPHTHALMOLOGY EXAM  11/12/2022   Diabetic kidney evaluation - eGFR measurement  05/01/2023   Diabetic kidney evaluation - Urine ACR  05/01/2023   Medicare Annual Wellness (AWV)  07/30/2023   COLONOSCOPY (Pts 45-14yrs Insurance coverage will need to be confirmed)  06/30/2027   DTaP/Tdap/Td (3 - Td or Tdap) 11/01/2031   Pneumonia Vaccine 16+ Years old  Completed   Hepatitis C Screening  Completed   Zoster Vaccines- Shingrix  Completed   HPV VACCINES  Aged Out    Health Maintenance  Health Maintenance Due  Topic Date Due   COVID-19 Vaccine (5 - 2023-24 season) 12/05/2021   FOOT EXAM  05/05/2022  Colorectal cancer screening: Type of screening: Colonoscopy. Completed Yes. Repeat every 5 years  Lung Cancer Screening: (Low Dose CT Chest recommended if Age 70-80 years, 30 pack-year currently smoking OR have quit w/in 15years.) does not qualify.   Lung Cancer Screening Referral:   Additional Screening:  Hepatitis C Screening: does qualify; Completed 10/06/2016  Vision Screening: Recommended annual ophthalmology exams for early detection of glaucoma and other disorders of the eye. Is the patient up to date with their annual eye exam?  Yes   Who is the provider or what is the name of the office in which the patient attends annual eye exams? 2023 by Dr. Larence Penning in [place]  If pt is not established with a provider, would they like to be referred to a provider to establish care? No .   Dental Screening: Recommended annual dental exams for proper oral hygiene  Community Resource Referral / Chronic Care Management: CRR required this visit?  No   CCM required this visit?  No      Plan:     I have personally reviewed and noted the following in the patient's chart:   Medical and social history Use of alcohol, tobacco or illicit drugs   Current medications and supplements including opioid prescriptions. Patient is currently taking opioid prescriptions. Information provided to patient regarding non-opioid alternatives. Patient advised to discuss non-opioid treatment plan with their provider. Functional ability and status Nutritional status Physical activity Advanced directives List of other physicians Hospitalizations, surgeries, and ER visits in previous 12 months Vitals Screenings to include cognitive, depression, and falls Referrals and appointments  In addition, I have reviewed and discussed with patient certain preventive protocols, quality metrics, and best practice recommendations. A written personalized care plan for preventive services as well as general preventive health recommendations were provided to patient.     Milus Mallick, CMA   07/30/2022   Nurse Notes: No concerns

## 2022-07-31 ENCOUNTER — Ambulatory Visit: Payer: Medicare Other

## 2022-08-12 ENCOUNTER — Other Ambulatory Visit: Payer: Self-pay | Admitting: *Deleted

## 2022-08-12 ENCOUNTER — Telehealth: Payer: Self-pay

## 2022-08-12 DIAGNOSIS — G894 Chronic pain syndrome: Secondary | ICD-10-CM

## 2022-08-12 DIAGNOSIS — Z79891 Long term (current) use of opiate analgesic: Secondary | ICD-10-CM

## 2022-08-12 MED ORDER — OXYCODONE-ACETAMINOPHEN 10-325 MG PO TABS: 1.0000 | ORAL_TABLET | Freq: Two times a day (BID) | ORAL | 0 refills | Status: DC | PRN

## 2022-08-12 MED ORDER — OXYCODONE HCL ER 20 MG PO T12A: 20.0000 mg | EXTENDED_RELEASE_TABLET | Freq: Two times a day (BID) | ORAL | 0 refills | Status: AC

## 2022-08-12 MED ORDER — OXYCODONE HCL ER 20 MG PO T12A: 20.0000 mg | EXTENDED_RELEASE_TABLET | Freq: Two times a day (BID) | ORAL | 0 refills | Status: DC

## 2022-08-12 NOTE — Telephone Encounter (Signed)
Attempted to call patient to notify, no answer, mailbox full.

## 2022-08-12 NOTE — Telephone Encounter (Signed)
His [facility] is closing on the 30th. He wants his scripts transferred to [address] [place]. The pharmacy [facility] told [facility] they will not transfer the scripts. He wants someone to call him about this.

## 2022-08-12 NOTE — Telephone Encounter (Signed)
Rx request sent to Dr. Lateef 

## 2022-09-11 ENCOUNTER — Telehealth: Payer: Self-pay | Admitting: Student in an Organized Health Care Education/Training Program

## 2022-09-11 NOTE — Telephone Encounter (Signed)
Pharmacy is out of stock of his medication, states they will receive more in 2 days. Informed patient that Dr. Cherylann Ratel is out of the office today, unable to send a new prescription.

## 2022-09-11 NOTE — Telephone Encounter (Signed)
Says he is unable to get his meds filled, please call

## 2022-09-24 DIAGNOSIS — K080 Exfoliation of teeth due to systemic causes: Secondary | ICD-10-CM | POA: Diagnosis not present

## 2022-10-06 ENCOUNTER — Other Ambulatory Visit: Payer: Self-pay | Admitting: Family Medicine

## 2022-10-06 NOTE — Telephone Encounter (Signed)
Medication Refill - Medication: Ozempic  Has the patient contacted their pharmacy? Yes.   (Agent: If no, request that the patient contact the pharmacy for the refill. If patient does not wish to contact the pharmacy document the reason why and proceed with request.) (Agent: If yes, when and what did the pharmacy advise?)  Preferred Pharmacy (with phone number or street name): [facility] [place] Has the patient been seen for an appointment in the last year OR does the patient have an upcoming appointment? yes  Agent: Please be advised that RX refills may take up to 3 business days. We ask that you follow-up with your pharmacy.

## 2022-10-07 DIAGNOSIS — K080 Exfoliation of teeth due to systemic causes: Secondary | ICD-10-CM | POA: Diagnosis not present

## 2022-10-07 MED ORDER — SEMAGLUTIDE (1 MG/DOSE) 4 MG/3ML ~~LOC~~ SOPN: 1.0000 mg | PEN_INJECTOR | SUBCUTANEOUS | 0 refills | Status: DC

## 2022-10-07 NOTE — Telephone Encounter (Signed)
Requested Prescriptions  Pending Prescriptions Disp Refills   Semaglutide, 1 MG/DOSE, 4 MG/3ML SOPN 9 mL 0    Sig: Inject 1 mg as directed once a week.     Endocrinology:  Diabetes - GLP-1 Receptor Agonists - semaglutide Passed - 10/06/2022 11:13 AM      Passed - HBA1C in normal range and within 180 days    Hemoglobin A1C  Date Value Ref Range Status  04/30/2022 5.6 4.0 - 5.6 % Final   HbA1c, POC (controlled diabetic range)  Date Value Ref Range Status  01/31/2019 5.8 0.0 - 7.0 % Final         Passed - Cr in normal range and within 360 days    Creat  Date Value Ref Range Status  04/30/2022 0.84 0.70 - 1.35 mg/dL Final   Creatinine, Urine  Date Value Ref Range Status  04/30/2022 274 20 - 320 mg/dL Final         Passed - Valid encounter within last 6 months    Recent Outpatient Visits           5 months ago Dyslipidemia associated with type 2 diabetes mellitus [facility])   [place] [facility] Alba Cory, MD   11 months ago Dyslipidemia associated with type 2 diabetes mellitus [facility])   [place] [facility] Alba Cory, MD   1 year ago Well adult exam   [facility] Alba Cory, MD   1 year ago Dyslipidemia associated with type 2 diabetes mellitus [facility])   [place] [facility] Alba Cory, MD   1 year ago Dyslipidemia associated with type 2 diabetes mellitus [facility])   [place] [facility] Alba Cory, MD       Future Appointments             In 3 weeks Carlynn Purl, Danna Hefty, MD [facility], [facility]

## 2022-10-19 DIAGNOSIS — K080 Exfoliation of teeth due to systemic causes: Secondary | ICD-10-CM | POA: Diagnosis not present

## 2022-10-28 NOTE — Progress Notes (Unsigned)
Name: Lawrence Thornton.   MRN: [id number]    DOB: July 10, 1955   Date:10/29/2022       Progress Note  Subjective  Chief Complaint  Follow Up  HPI  DMII: diagnosed May 2019 with a A1C was 6.9% he was  on Metformin 750 mg daily but stopped due to side effects. He has been on Ozempic since July 2023, he is down 46 lbs since started on medication, A1C is normal now, he wants to continue taking medication.   He denies polydipsia ( but states likes water), no polyphagia, has nocturia from BPH that is better with Tamsulosin at night .  He is taking Lovaza two daily instead of four daily  and Atorvastatin He has Viagra and takes it prn only for ED. HTN is under control .   Dyslipidemia: he is on Lovaza and Atorvastatin and last LDL was 42, we will recheck labs yearly    Chronic pain and opioid use: he was given Linzess and only takes it prn, very expensive .  He states bowel movements are now daily, but states needs to take medication occasionally to help him have a bowel movement, he states going about 4 days without a bowel movement because he only takes it prn and when he takes he has diarrhea, advised to go down on dose and consider miralax. He is under the care of [facility] and Dr. Cherylann Ratel, pain level now is 3/10 right lower back, early in 2024 he developed Left lower extremity radiculopathy, so  he had a repeat MRI that showed L3-L4 through L5-S1 showed prior decompression and fusion that were stable but new findings on T12-L1 and L2-L3 with DDD and some bulky disc, Dr. Cherylann Ratel offered injections and stimulator but he states he is doing better now  Obesity: his BMI is now below 35, he is doing well on Ozempic   BPH: Taking Tamsulosin at night and gets up twice to void but states sometimes only once.  Last PSA was normal January 2024   HTN: no chest pain, palpitation or SOB. No side effects of medication BP is at goal today,  continue current regiment for now and monitor for low bp  Patient Active  Problem List   Diagnosis Date Noted   Spinal stenosis, lumbar region, with neurogenic claudication 05/20/2022   Radicular pain of left lower extremity 04/21/2022   Chronic use of opiate for therapeutic purpose 12/15/2018   Morbid obesity (HCC) 07/28/2018   Lumbar spondylosis 03/15/2018   Long term current use of opiate analgesic 12/15/2017   Dyslipidemia associated with type 2 diabetes mellitus (HCC) 08/13/2017   Osteoarthritis of acromioclavicular joint 07/30/2017   S/P lumbar fusion 07/13/2017   Chronic pain syndrome 07/13/2017   Personal history of colonic polyps    Constipation due to opioid therapy 07/18/2015   Hypertriglyceridemia 01/22/2015   S/P knee replacement 12/21/2014   Chronic right-sided low back pain without sciatica 09/21/2014   BPH associated with nocturia 09/21/2014   Dyslipidemia 09/21/2014   Continuous opioid dependence (HCC) 09/21/2014   Lumbar degenerative disc disease 09/21/2014   Failure of erection 09/21/2014    Past Surgical History:  Procedure Laterality Date   APPENDECTOMY  age 67   arthroscopic shoulder surgery Right 08/31/2017   BACK SURGERY  lower back surgery x 3    L 3 L 4 and L 5 are fused together   COLONOSCOPY WITH PROPOFOL N/A 05/04/2017   Procedure: COLONOSCOPY WITH PROPOFOL;  Surgeon: Midge Minium, MD;  Location:  [facility] ENDOSCOPY;  Service: Endoscopy;  Laterality: N/A;   COLONOSCOPY WITH PROPOFOL N/A 06/30/2022   Procedure: COLONOSCOPY WITH PROPOFOL;  Surgeon: Midge Minium, MD;  Location: [facility] ENDOSCOPY;  Service: Endoscopy;  Laterality: N/A;   LUMBAR FUSION  1999   L- to L5   metal removed from eye  years ago   sebaceous cyst removed Right 15 years ago   middle  finger    TOE FUSION Right    4th toe   TOTAL KNEE ARTHROPLASTY Left 08/10/2014   Procedure: LEFT TOTAL KNEE ARTHROPLASTY;  Surgeon: Eugenia Mcalpine, MD;  Location: [facility] ORS;  Service: Orthopedics;  Laterality: Left;   TOTAL KNEE ARTHROPLASTY Right 12/21/2014   Procedure: RIGHT TOTAL  KNEE ARTHROPLASTY;  Surgeon: Eugenia Mcalpine, MD;  Location: [facility]S;  Service: Orthopedics;  Laterality: Right;   TOTAL KNEE ARTHROPLASTY Right 12/21/14    Family History  Problem Relation Age of Onset   Cancer Mother        Throat   Heart attack Mother    Hypertension Father    Hyperlipidemia Father     Social History   Tobacco Use   Smoking status: Former    Current packs/day: 0.00    Average packs/day: 1.5 packs/day for 20.0 years (30.0 ttl pk-yrs)    Types: Cigarettes    Start date: 04/07/1979    Quit date: 04/07/1999    Years since quitting: 23.5   Smokeless tobacco: Never   Tobacco comments:    not smoking   Substance Use Topics   Alcohol use: No     Current Outpatient Medications:    aspirin EC 81 MG tablet, Take 1 tablet (81 mg total) by mouth daily., Disp: 30 tablet, Rfl: 0   linaclotide (LINZESS) 72 MCG capsule, Take 1 capsule (72 mcg total) by mouth daily before breakfast., Disp: 90 capsule, Rfl: 1   naloxone (NARCAN) 2 MG/2ML injection, Inject 1 mL (1 mg total) into the muscle as needed for up to 2 doses (for opioid overdose). Inject content of syringe into thigh muscle. Call 911., Disp: 2 mL, Rfl: 1   oxyCODONE (OXYCONTIN) 20 mg 12 hr tablet, Take 1 tablet (20 mg total) by mouth every 12 (twelve) hours. Months last 30 days., Disp: 60 tablet, Rfl: 0   oxyCODONE-acetaminophen (PERCOCET) 10-325 MG tablet, Take 1 tablet by mouth 2 (two) times daily as needed for pain., Disp: 60 tablet, Rfl: 0   sildenafil (VIAGRA) 100 MG tablet, Take 1 tablet (100 mg total) by mouth daily as needed for erectile dysfunction., Disp: 30 tablet, Rfl: 0   tiZANidine (ZANAFLEX) 4 MG tablet, Take 1 tablet (4 mg total) by mouth 2 (two) times daily as needed for muscle spasms., Disp: 60 tablet, Rfl: 5   amLODIPine-Valsartan-HCTZ 10-320-25 MG TABS, One tablet by mouth once daily, Disp: 90 tablet, Rfl: 1   atorvastatin (LIPITOR) 20 MG tablet, Take 1 tablet (20 mg total) by mouth daily., Disp: 90  tablet, Rfl: 1   omega-3 acid ethyl esters (LOVAZA) 1 g capsule, Take 2 capsules (2 g total) by mouth 2 (two) times daily., Disp: 360 capsule, Rfl: 1   oxyCODONE-acetaminophen (PERCOCET) 10-325 MG tablet, Take 1 tablet by mouth 2 (two) times daily as needed for pain., Disp: 60 tablet, Rfl: 0   oxyCODONE-acetaminophen (PERCOCET) 10-325 MG tablet, Take 1 tablet by mouth 2 (two) times daily as needed for pain., Disp: 60 tablet, Rfl: 0   Semaglutide, 1 MG/DOSE, 4 MG/3ML SOPN, Inject 1 mg as directed once a  week., Disp: 9 mL, Rfl: 0   tamsulosin (FLOMAX) 0.4 MG CAPS capsule, Take 1 capsule (0.4 mg total) by mouth daily., Disp: 90 capsule, Rfl: 1  No Known Allergies  I personally reviewed active problem list, medication list, allergies, family history, social history, health maintenance with the patient/caregiver today.   ROS  Ten systems reviewed and is negative except as mentioned in HPI    Objective  Vitals:   10/29/22 0743  BP: 126/72  Pulse: 90  Resp: 16  SpO2: 96%  Weight: 236 lb (107 kg)  Height: 6' (1.829 m)    Body mass index is 32.01 kg/m.  Physical Exam  Constitutional: Patient appears well-developed and well-nourished. Obese  No distress.  HEENT: head atraumatic, normocephalic, pupils equal and reactive to light, neck supple, throat within normal limits Cardiovascular: Normal rate, regular rhythm and normal heart sounds.  No murmur heard. No BLE edema. Pulmonary/Chest: Effort normal and breath sounds normal. No respiratory distress. Abdominal: Soft.  There is no tenderness. Psychiatric: Patient has a normal mood and affect. behavior is normal. Judgment and thought content normal.   Recent Results (from the past 2160 hour(s))  POCT HgB A1C     Status: None   Collection Time: 10/29/22  7:44 AM  Result Value Ref Range   Hemoglobin A1C 5.3 4.0 - 5.6 %   HbA1c POC (<> result, manual entry)     HbA1c, POC (prediabetic range)     HbA1c, POC (controlled diabetic range)       Diabetic Foot Exam: Diabetic Foot Exam - Simple   Simple Foot Form Visual Inspection No deformities, no ulcerations, no other skin breakdown bilaterally: Yes Sensation Testing Intact to touch and monofilament testing bilaterally: Yes Pulse Check Posterior Tibialis and Dorsalis pulse intact bilaterally: Yes Comments      PHQ2/9:    10/29/2022    7:44 AM 07/30/2022    8:33 AM 07/23/2022    1:06 PM 05/20/2022    1:04 PM 04/30/2022    7:53 AM  Depression screen PHQ 2/9  Decreased Interest 0 0 0 0 0  Down, Depressed, Hopeless 0 0 0 0 0  PHQ - 2 Score 0 0 0 0 0  Altered sleeping 0    0  Tired, decreased energy 0    0  Change in appetite 0    0  Feeling bad or failure about yourself  0    0  Trouble concentrating 0    0  Moving slowly or fidgety/restless 0    0  Suicidal thoughts 0    0  PHQ-9 Score 0    0    phq 9 is negative   Fall Risk:    10/29/2022    7:44 AM 07/30/2022    8:25 AM 07/23/2022    1:05 PM 05/20/2022    1:04 PM 04/30/2022    7:53 AM  Fall Risk   Falls in the past year? 0 0 0 0 0  Number falls in past yr: 0 0   0  Injury with Fall? 0 0   0  Risk for fall due to : No Fall Risks No Fall Risks   No Fall Risks  Follow up Falls prevention discussed Falls prevention discussed   Falls prevention discussed      Functional Status Survey: Is the patient deaf or have difficulty hearing?: No Does the patient have difficulty seeing, even when wearing glasses/contacts?: No Does the patient have difficulty concentrating, remembering, or making decisions?:  No Does the patient have difficulty walking or climbing stairs?: No Does the patient have difficulty dressing or bathing?: No Does the patient have difficulty doing errands alone such as visiting a doctor's office or shopping?: No    Assessment & Plan  1. Dyslipidemia associated with type 2 diabetes mellitus (HCC)  - POCT HgB A1C - HM Diabetes Foot Exam - Semaglutide, 1 MG/DOSE, 4 MG/3ML SOPN; Inject 1  mg as directed once a week.  Dispense: 9 mL; Refill: 0 - omega-3 acid ethyl esters (LOVAZA) 1 g capsule; Take 2 capsules (2 g total) by mouth 2 (two) times daily.  Dispense: 360 capsule; Refill: 1 - atorvastatin (LIPITOR) 20 MG tablet; Take 1 tablet (20 mg total) by mouth daily.  Dispense: 90 tablet; Refill: 1  2. Obesity (BMI 30.0-34.9)  Weight is stable now, lost about 50 lbs so far on Ozempic   3. Hypertension associated with diabetes (HCC)  - amLODIPine-Valsartan-HCTZ 10-320-25 MG TABS; One tablet by mouth once daily  Dispense: 90 tablet; Refill: 1  4. Continuous opioid dependence (HCC)  He has Narcan   5. Chronic pain syndrome  Under the care of pain clinic   6. Constipation due to opioid therapy  Taking Linzess most of the time  7. BPH associated with nocturia  - tamsulosin (FLOMAX) 0.4 MG CAPS capsule; Take 1 capsule (0.4 mg total) by mouth daily.  Dispense: 90 capsule; Refill: 1  8. Essential hypertension  - amLODIPine-Valsartan-HCTZ 10-320-25 MG TABS; One tablet by mouth once daily  Dispense: 90 tablet; Refill: 1

## 2022-10-29 ENCOUNTER — Encounter: Payer: Self-pay | Admitting: Family Medicine

## 2022-10-29 ENCOUNTER — Ambulatory Visit (INDEPENDENT_AMBULATORY_CARE_PROVIDER_SITE_OTHER): Payer: Medicare Other | Admitting: Family Medicine

## 2022-10-29 VITALS — BP 126/72 | HR 90 | Resp 16 | Ht 72.0 in | Wt 236.0 lb

## 2022-10-29 DIAGNOSIS — E785 Hyperlipidemia, unspecified: Secondary | ICD-10-CM

## 2022-10-29 DIAGNOSIS — E1159 Type 2 diabetes mellitus with other circulatory complications: Secondary | ICD-10-CM | POA: Diagnosis not present

## 2022-10-29 DIAGNOSIS — E1169 Type 2 diabetes mellitus with other specified complication: Secondary | ICD-10-CM

## 2022-10-29 DIAGNOSIS — F1120 Opioid dependence, uncomplicated: Secondary | ICD-10-CM | POA: Diagnosis not present

## 2022-10-29 DIAGNOSIS — E669 Obesity, unspecified: Secondary | ICD-10-CM | POA: Diagnosis not present

## 2022-10-29 DIAGNOSIS — T402X5A Adverse effect of other opioids, initial encounter: Secondary | ICD-10-CM

## 2022-10-29 DIAGNOSIS — R351 Nocturia: Secondary | ICD-10-CM

## 2022-10-29 DIAGNOSIS — G894 Chronic pain syndrome: Secondary | ICD-10-CM

## 2022-10-29 DIAGNOSIS — I10 Essential (primary) hypertension: Secondary | ICD-10-CM

## 2022-10-29 DIAGNOSIS — K5903 Drug induced constipation: Secondary | ICD-10-CM

## 2022-10-29 DIAGNOSIS — N401 Enlarged prostate with lower urinary tract symptoms: Secondary | ICD-10-CM

## 2022-10-29 DIAGNOSIS — I152 Hypertension secondary to endocrine disorders: Secondary | ICD-10-CM

## 2022-10-29 LAB — POCT GLYCOSYLATED HEMOGLOBIN (HGB A1C): Hemoglobin A1C: 5.3 % (ref 4.0–5.6)

## 2022-10-29 MED ORDER — ATORVASTATIN CALCIUM 20 MG PO TABS: 20.0000 mg | ORAL_TABLET | Freq: Every day | ORAL | 1 refills | Status: DC

## 2022-10-29 MED ORDER — OMEGA-3-ACID ETHYL ESTERS 1 G PO CAPS: 2.0000 g | ORAL_CAPSULE | Freq: Two times a day (BID) | ORAL | 1 refills | Status: DC

## 2022-10-29 MED ORDER — SEMAGLUTIDE (1 MG/DOSE) 4 MG/3ML ~~LOC~~ SOPN: 1.0000 mg | PEN_INJECTOR | SUBCUTANEOUS | 0 refills | Status: DC

## 2022-10-29 MED ORDER — TAMSULOSIN HCL 0.4 MG PO CAPS: 0.4000 mg | ORAL_CAPSULE | Freq: Every day | ORAL | 1 refills | Status: DC

## 2022-10-29 MED ORDER — AMLODIPINE-VALSARTAN-HCTZ 10-320-25 MG PO TABS: ORAL_TABLET | ORAL | 1 refills | Status: DC

## 2022-11-03 ENCOUNTER — Ambulatory Visit
Payer: Medicare Other | Attending: Student in an Organized Health Care Education/Training Program | Admitting: Student in an Organized Health Care Education/Training Program

## 2022-11-03 ENCOUNTER — Encounter: Payer: Self-pay | Admitting: Student in an Organized Health Care Education/Training Program

## 2022-11-03 VITALS — BP 129/70 | HR 64 | Temp 98.1°F | Resp 16 | Ht 72.0 in | Wt 240.0 lb

## 2022-11-03 DIAGNOSIS — Z79891 Long term (current) use of opiate analgesic: Secondary | ICD-10-CM | POA: Diagnosis not present

## 2022-11-03 DIAGNOSIS — M5410 Radiculopathy, site unspecified: Secondary | ICD-10-CM | POA: Diagnosis not present

## 2022-11-03 DIAGNOSIS — G894 Chronic pain syndrome: Secondary | ICD-10-CM | POA: Diagnosis not present

## 2022-11-03 DIAGNOSIS — M545 Low back pain, unspecified: Secondary | ICD-10-CM | POA: Insufficient documentation

## 2022-11-03 DIAGNOSIS — Z981 Arthrodesis status: Secondary | ICD-10-CM | POA: Diagnosis not present

## 2022-11-03 DIAGNOSIS — M48062 Spinal stenosis, lumbar region with neurogenic claudication: Secondary | ICD-10-CM | POA: Insufficient documentation

## 2022-11-03 DIAGNOSIS — G8929 Other chronic pain: Secondary | ICD-10-CM | POA: Diagnosis not present

## 2022-11-03 MED ORDER — OXYCODONE-ACETAMINOPHEN 10-325 MG PO TABS: 1.0000 | ORAL_TABLET | Freq: Two times a day (BID) | ORAL | 0 refills | Status: DC | PRN

## 2022-11-03 MED ORDER — OXYCODONE HCL ER 20 MG PO T12A: 20.0000 mg | EXTENDED_RELEASE_TABLET | Freq: Two times a day (BID) | ORAL | 0 refills | Status: AC

## 2022-11-03 MED ORDER — OXYCODONE HCL ER 20 MG PO T12A: 20.0000 mg | EXTENDED_RELEASE_TABLET | Freq: Two times a day (BID) | ORAL | 0 refills | Status: DC

## 2022-11-03 NOTE — Progress Notes (Signed)
PROVIDER NOTE: Information contained herein reflects review and annotations entered in association with encounter. Interpretation of such information and data should be left to medically-trained personnel. Information provided to patient can be located elsewhere in the medical record under "Patient Instructions". Document created using STT-dictation technology, any transcriptional errors that may result from process are unintentional.    Patient: Lawrence Thornton.  Service Category: E/M  Provider: Edward Jolly, MD  DOB: April 28, 1955  DOS: 11/03/2022  Referring Provider: Alba Cory, MD  MRN: [id number]  Specialty: Interventional Pain Management  PCP: Alba Cory, MD  Type: Established Patient  Setting: Ambulatory outpatient    Location: Office  Delivery: Face-to-face     HPI  [name]., a 67 y.o. year old male, is here today because of his Chronic pain syndrome [G89.4]. [name] primary complain today is Back Pain (Right, lower) Last encounter: My last encounter with him was on 07/23/22 Pertinent problems: [name] has Chronic right-sided low back pain without sciatica; Continuous opioid dependence (HCC); S/P knee replacement; S/P lumbar fusion; Osteoarthritis of acromioclavicular joint; Long term current use of opiate analgesic; Lumbar spondylosis; Morbid obesity (HCC); and Chronic use of opiate for therapeutic purpose on their pertinent problem list. Pain Assessment: Severity of Chronic pain is reported as a 4 /10. Location: Back Right, Upper/denies. Onset: More than a month ago. Quality: Archie Patten. Timing: Constant. Modifying factor(s): medications, exercise. Vitals:  height is 6' (1.829 m) and weight is 240 lb (108.9 kg). His temporal temperature is 98.1 F (36.7 C). His blood pressure is 129/70 and his pulse is 64. His respiration is 16 and oxygen saturation is 100%.  BMI: Estimated body mass index is 32.55 kg/m as calculated from the following:   Height  as of this encounter: 6' (1.829 m).   Weight as of this encounter: 240 lb (108.9 kg).  Reason for encounter: medication management  No change in medical history since last visit.  Patient's pain is at baseline.  Patient continues multimodal pain regimen as prescribed.  States that it provides pain relief and improvement in functional status. No recent radicular pain flares He was weaned from a total MME >130 to 90 now. Has been stretching and exercising more   Pharmacotherapy Assessment  Analgesic: Oxycontin ER 20 mg BID, Oxycodone 10 mg BID PRN breakthrough pain  Monitoring: [place] PMP: PDMP reviewed during this encounter.       Pharmacotherapy: No side-effects or adverse reactions reported. Compliance: No problems identified. Effectiveness: Clinically acceptable.  [name], RN  11/03/2022  8:57 AM  Sign when Signing Visit Nursing Pain Medication Assessment:  Safety precautions to be maintained throughout the outpatient stay will include: orient to surroundings, keep bed in low position, maintain call bell within reach at all times, provide assistance with transfer out of bed and ambulation.  Medication Inspection Compliance: Pill count conducted under aseptic conditions, in front of the patient. Neither the pills nor the bottle was removed from the patient's sight at any time. Once count was completed pills were immediately returned to the patient in their original bottle.  Medication #1: Oxycodone/APAP Pill/Patch Count:  19 of 60 pills remain Pill/Patch Appearance: Markings consistent with prescribed medication Bottle Appearance: Standard pharmacy container. Clearly labeled. Filled Date: 07 / 02 / 2024 Last Medication intake:  Today  Medication #2: Oxycodone IR Pill/Patch Count:  19 of 60 pills remain Pill/Patch Appearance: Markings consistent with prescribed medication Bottle Appearance: Standard pharmacy container. Clearly labeled. Filled Date: 07 /  02 / 2024 Last  Medication intake:  TodaySafety precautions to be maintained throughout the outpatient stay will include: orient to surroundings, keep bed in low position, maintain call bell within reach at all times, provide assistance with transfer out of bed and ambulation.     No results found for: "CBDTHCR" No results found for: "D8THCCBX" No results found for: "D9THCCBX"  UDS:  Summary  Date Value Ref Range Status  07/23/2022 Note  Final    Comment:    ==================================================================== [employer] [facility] 13 (MW) ==================================================================== Test                             Result       Flag       Units  Drug Present and Declared for Prescription Verification   Oxycodone                      3671         EXPECTED   ng/mg creat   Oxymorphone                    2119         EXPECTED   ng/mg creat   Noroxycodone                   >3968        EXPECTED   ng/mg creat   Noroxymorphone                 1881         EXPECTED   ng/mg creat    Sources of oxycodone are scheduled prescription medications.    Oxymorphone, noroxycodone, and noroxymorphone are expected    metabolites of oxycodone. Oxymorphone is also available as a    scheduled prescription medication.  ==================================================================== Test                      Result    Flag   Units      Ref Range   Creatinine              252              mg/dL      >=52 ==================================================================== Declared Medications:  The flagging and interpretation on this report are based on the  following declared medications.  Unexpected results may arise from  inaccuracies in the declared medications.   **Note: The testing scope of this panel includes these medications:   Oxycodone (Oxycontin)  Oxycodone (Percocet)   **Note: The testing scope of this panel does not include the  following reported medications:    Acetaminophen (Percocet)  Amlodipine  Aspirin  Atorvastatin (Lipitor)  Hydrochlorothiazide  Linaclotide (Linzess)  Naloxone (Narcan)  Omega-3 Fatty Acids  Polyethylene Glycol  Semaglutide  Sildenafil (Viagra)  Tamsulosin (Flomax)  Tizanidine (Zanaflex)  Valsartan ==================================================================== For clinical consultation, please call [phone]. ====================================================================       ROS  Constitutional: Denies any fever or chills Gastrointestinal: No reported hemesis, hematochezia, vomiting, or acute GI distress Musculoskeletal:  Low back, left leg pain Neurological: No reported episodes of acute onset apraxia, aphasia, dysarthria, agnosia, amnesia, paralysis, loss of coordination, or loss of consciousness  Medication Review  Semaglutide (1 MG/DOSE), amLODIPine-Valsartan-HCTZ, aspirin EC, atorvastatin, linaclotide, naloxone, omega-3 acid ethyl esters, oxyCODONE, oxyCODONE-acetaminophen, sildenafil, tamsulosin, and tiZANidine  History Review  Allergy: Mr. Bigos has No Known Allergies. Drug: Mr.  Grazioli  reports no history of drug use. Alcohol:  reports no history of alcohol use. Tobacco:  reports that he quit smoking about 23 years ago. His smoking use included cigarettes. He started smoking about 43 years ago. He has a 30 pack-year smoking history. He has never used smokeless tobacco. Social: Mr. Catching  reports that he quit smoking about 23 years ago. His smoking use included cigarettes. He started smoking about 43 years ago. He has a 30 pack-year smoking history. He has never used smokeless tobacco. He reports that he does not drink alcohol and does not use drugs. Medical:  has a past medical history of Arthritis and Hypertension. Surgical: Mr. Pavese  has a past surgical history that includes Back surgery (lower back surgery x 3 ); Appendectomy (age 71); sebaceous cyst removed (Right, 15 years  ago); Toe Fusion (Right); metal removed from eye (years ago); Total knee arthroplasty (Left, 08/10/2014); Total knee arthroplasty (Right, 12/21/2014); Total knee arthroplasty (Right, 12/21/14); Lumbar fusion (1999); Colonoscopy with propofol (N/A, 05/04/2017); arthroscopic shoulder surgery (Right, 08/31/2017); and Colonoscopy with propofol (N/A, 06/30/2022). Family: family history includes Cancer in his mother; Heart attack in his mother; Hyperlipidemia in his father; Hypertension in his father.  Laboratory Chemistry Profile   Renal Lab Results  Component Value Date   BUN 13 04/30/2022   CREATININE 0.84 04/30/2022   LABCREA 274 04/30/2022   BCR SEE NOTE: 04/30/2022   GFRAA 108 05/03/2020   GFRNONAA 93 05/03/2020    Hepatic Lab Results  Component Value Date   AST 20 04/30/2022   ALT 23 04/30/2022   ALBUMIN 4.6 11/11/2015   ALKPHOS 76 11/11/2015   HCVAB NEGATIVE 10/06/2016    Electrolytes Lab Results  Component Value Date   NA 137 04/30/2022   K 3.9 04/30/2022   CL 99 04/30/2022   CALCIUM 9.7 04/30/2022    Bone Lab Results  Component Value Date   VD25OH 43 10/06/2016    Inflammation (CRP: Acute Phase) (ESR: Chronic Phase) No results found for: "CRP", "ESRSEDRATE", "LATICACIDVEN"       Note: Above Lab results reviewed.  Recent Imaging Review  MR LUMBAR SPINE W WO CONTRAST CLINICAL DATA:  67 year old male with persistent low back pain. Left leg radicular pain. Prior surgery.  EXAM: MRI LUMBAR SPINE WITHOUT AND WITH CONTRAST  TECHNIQUE: Multiplanar and multiecho pulse sequences of the lumbar spine were obtained without and with intravenous contrast.  CONTRAST:  10mL GADAVIST GADOBUTROL 1 MMOL/ML IV SOLN  COMPARISON:  Lumbar radiographs 03/16/2013.  FINDINGS: Segmentation:  Normal on the comparison.  Alignment: Straightening of lumbar lordosis not significantly changed since 2014. No significant scoliosis or spondylolisthesis.  Vertebrae: Abundant susceptibility  artifact associated with chronic posterior spinal fusion hardware L3 through the sacrum.  Evidence of solid arthrodesis at L4-L5 and L5-S1, and possibly at the posterior elements of L3-L4. Advanced mostly chronic appearing degenerative endplate marrow changes at L1-L2, see details below. Background bone marrow signal within normal limits. No definite No acute osseous abnormality identified.  Conus medullaris and cauda equina: Conus extends to the L1, extending toward the L1-L2 disc level. Degenerative conus mass effect (series 5, image 10), but no lower spinal cord or conus signal abnormality.  No abnormal intradural enhancement or dural thickening is evident.  Paraspinal and other soft tissues: Postoperative changes to the posterior lumbar paraspinal soft tissues. Negative visible abdominal viscera.  Disc levels:  T12-L1: Some disc space loss with bulky circumferential disc osteophyte complex. Broad-based posterior component and mild to moderate  facet and ligament flavum hypertrophy greater on the left. Mild spinal stenosis, with up to mild mass effect on the lower thoracic spinal cord here (series 5, image 10). Mild left lateral recess stenosis (left L1 nerve level). And moderate to severe multifactorial left T12 foraminal stenosis (series 5, image 12).  L1-L2: Severe disc space loss, bulky circumferential disc osteophyte complex. Broad-based posterior component. Relatively mild facet and ligament flavum hypertrophy. Some epidural lipomatosis. Severe spinal stenosis (series 8, image 9) with some mass effect on the conus. Moderate to severe bilateral L1 foraminal stenosis.  L2-L3: Better preserved disc space, but circumferential disc osteophyte complex with a bulky and broad-based posterior component eccentric to the left. Partially visible at least moderate facet and ligament flavum hypertrophy with some epidural lipomatosis.  Severe spinal stenosis, with lateral recess  stenosis greater on the left (series 8, image 18, descending left L3 nerve level). Mild to moderate L2 foraminal stenosis greater on the left.  L3-L4:  Prior decompression and fusion.  No adverse features.  L4-L5: Prior decompression and fusion. Evidence of solid arthrodesis, no adverse features.  L5-S1: Prior decompression and fusion. Evidence of solid arthrodesis. No adverse features.  IMPRESSION: 1. Prior decompression and fusion from L3-L4 through L5-S1 with no adverse features.  2. But bulky disc and endplate degeneration elsewhere T12-L1 through the adjacent L2-L3 segment. Subsequent moderate to severe multifactorial spinal stenosis at L1-L2 (level of the conus), and L2-L3 (left lateral recess more affected). Mild spinal stenosis at T12-L1. Associated mass effect on the lower thoracic spinal cord and conus, but no cord/conus signal abnormality.  3. Associated moderate to severe left T12 and bilateral L1 neural foraminal stenosis. Up to moderate left L2 foraminal stenosis.  Electronically Signed   By: Odessa Fleming M.D.   On: 05/02/2022 08:00 Note: Reviewed        Physical Exam  General appearance: Well nourished, well developed, and well hydrated. In no apparent acute distress Mental status: Alert, oriented x 3 (person, place, & time)       Respiratory: No evidence of acute respiratory distress Eyes: PERLA Vitals: BP 129/70   Pulse 64   Temp 98.1 F (36.7 C) (Temporal)   Resp 16   Ht 6' (1.829 m)   Wt 240 lb (108.9 kg)   SpO2 100%   BMI 32.55 kg/m  BMI: Estimated body mass index is 32.55 kg/m as calculated from the following:   Height as of this encounter: 6' (1.829 m).   Weight as of this encounter: 240 lb (108.9 kg). Ideal: Ideal body weight: 77.6 kg (171 lb 1.2 oz) Adjusted ideal body weight: 90.1 kg (198 lb 10.3 oz)  Lumbar Spine Area Exam  Skin & Axial Inspection: Well healed scar from previous spine surgery detected Alignment: Symmetrical Functional  ROM: Pain restricted ROM affecting both sides Stability: No instability detected Muscle Tone/Strength: Functionally intact. No obvious neuro-muscular anomalies detected. Sensory (Neurological): Dermatomal with radiation into left buttock and left lateral thigh   Gait & Posture Assessment  Ambulation: Unassisted Gait: Relatively normal for age and body habitus Posture: WNL  Lower Extremity Exam      Side: Right lower extremity   Side: Left lower extremity  Stability: No instability observed           Stability: No instability observed          Skin & Extremity Inspection: Skin color, temperature, and hair growth are WNL. No peripheral edema or cyanosis. No masses, redness, swelling, asymmetry, or associated skin  lesions. No contractures.   Skin & Extremity Inspection: Skin color, temperature, and hair growth are WNL. No peripheral edema or cyanosis. No masses, redness, swelling, asymmetry, or associated skin lesions. No contractures.  Functional ROM: Unrestricted ROM                   Functional ROM: Unrestricted ROM                  Muscle Tone/Strength: Functionally intact. No obvious neuro-muscular anomalies detected.   Muscle Tone/Strength: Functionally intact. No obvious neuro-muscular anomalies detected.  Sensory (Neurological): Unimpaired         Sensory (Neurological): Dermatomal       DTR: Patellar: deferred today Achilles: deferred today Plantar: deferred today   DTR: Patellar: deferred today Achilles: deferred today Plantar: deferred today  Palpation: No palpable anomalies   Palpation: No palpable anomalies    Assessment   Diagnosis Status  1. Chronic pain syndrome   2. Chronic use of opiate for therapeutic purpose   3. Spinal stenosis, lumbar region, with neurogenic claudication   4. S/P lumbar fusion   5. Radicular pain of left lower extremity   6. Chronic right-sided low back pain without sciatica    Controlled Controlled Controlled    Plan of Care   1.  Chronic pain syndrome - oxyCODONE-acetaminophen (PERCOCET) 10-325 MG tablet; Take 1 tablet by mouth 2 (two) times daily as needed for pain.  Dispense: 60 tablet; Refill: 0 - oxyCODONE-acetaminophen (PERCOCET) 10-325 MG tablet; Take 1 tablet by mouth 2 (two) times daily as needed for pain.  Dispense: 60 tablet; Refill: 0 - oxyCODONE-acetaminophen (PERCOCET) 10-325 MG tablet; Take 1 tablet by mouth 2 (two) times daily as needed for pain.  Dispense: 60 tablet; Refill: 0 - oxyCODONE (OXYCONTIN) 20 mg 12 hr tablet; Take 1 tablet (20 mg total) by mouth every 12 (twelve) hours. Months last 30 days.  Dispense: 60 tablet; Refill: 0 - oxyCODONE (OXYCONTIN) 20 mg 12 hr tablet; Take 1 tablet (20 mg total) by mouth every 12 (twelve) hours. Months last 30 days.  Dispense: 60 tablet; Refill: 0 - oxyCODONE (OXYCONTIN) 20 mg 12 hr tablet; Take 1 tablet (20 mg total) by mouth every 12 (twelve) hours. Months last 30 days.  Dispense: 60 tablet; Refill: 0  2. Chronic use of opiate for therapeutic purpose - oxyCODONE-acetaminophen (PERCOCET) 10-325 MG tablet; Take 1 tablet by mouth 2 (two) times daily as needed for pain.  Dispense: 60 tablet; Refill: 0 - oxyCODONE-acetaminophen (PERCOCET) 10-325 MG tablet; Take 1 tablet by mouth 2 (two) times daily as needed for pain.  Dispense: 60 tablet; Refill: 0 - oxyCODONE-acetaminophen (PERCOCET) 10-325 MG tablet; Take 1 tablet by mouth 2 (two) times daily as needed for pain.  Dispense: 60 tablet; Refill: 0 - oxyCODONE (OXYCONTIN) 20 mg 12 hr tablet; Take 1 tablet (20 mg total) by mouth every 12 (twelve) hours. Months last 30 days.  Dispense: 60 tablet; Refill: 0 - oxyCODONE (OXYCONTIN) 20 mg 12 hr tablet; Take 1 tablet (20 mg total) by mouth every 12 (twelve) hours. Months last 30 days.  Dispense: 60 tablet; Refill: 0 - oxyCODONE (OXYCONTIN) 20 mg 12 hr tablet; Take 1 tablet (20 mg total) by mouth every 12 (twelve) hours. Months last 30 days.  Dispense: 60 tablet; Refill: 0  3.  Spinal stenosis, lumbar region, with neurogenic claudication  4. S/P lumbar fusion  5. Radicular pain of left lower extremity  6. 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    Recent Visits No visits were found meeting these conditions. Showing recent visits within past 90 days and meeting all other requirements Today's Visits Date Type Provider Dept  11/03/22 Office Visit Edward Jolly, MD [facility]  Showing today's visits and meeting all other requirements Future Appointments No visits were found meeting these conditions. Showing future appointments within next 90 days and meeting all other requirements  I discussed the assessment and treatment plan with the patient. The patient was provided an opportunity to ask questions and all were answered. The patient agreed with the plan and demonstrated an understanding of the instructions.  Patient advised to call back or seek an in-person evaluation if the symptoms or condition worsens.  Duration of encounter: .  Total time on encounter, as per AMA guidelines included both the face-to-face and non-face-to-face time personally spent by the physician and/or other qualified health care professional(s) on the day of the encounter (includes time in activities that require the physician or other qualified health care professional and does not include time in activities normally performed by clinical staff). Physician's time may include the following activities when performed: Preparing to see the patient (e.g., pre-charting review of records, searching for previously ordered imaging, lab work, and  nerve conduction tests) Review of prior analgesic pharmacotherapies. Reviewing PMP Interpreting ordered tests (e.g., lab work, imaging, nerve conduction tests) Performing post-procedure evaluations, including interpretation of diagnostic procedures Obtaining and/or reviewing separately obtained history Performing a medically appropriate examination and/or evaluation Counseling and educating the patient/family/caregiver Ordering medications, tests, or procedures Referring and communicating with other health care professionals (when not separately reported) Documenting clinical information in the electronic or other health record Independently interpreting results (not separately reported) and communicating results to the patient/ family/caregiver Care coordination (not separately reported)  Note by: Edward Jolly, MD Date: 11/03/2022; Time: 9:03 AM

## 2022-11-03 NOTE — Progress Notes (Signed)
Nursing Pain Medication Assessment:  Safety precautions to be maintained throughout the outpatient stay will include: orient to surroundings, keep bed in low position, maintain call bell within reach at all times, provide assistance with transfer out of bed and ambulation.  Medication Inspection Compliance: Pill count conducted under aseptic conditions, in front of the patient. Neither the pills nor the bottle was removed from the patient's sight at any time. Once count was completed pills were immediately returned to the patient in their original bottle.  Medication #1: Oxycodone/APAP Pill/Patch Count:  19 of 60 pills remain Pill/Patch Appearance: Markings consistent with prescribed medication Bottle Appearance: Standard pharmacy container. Clearly labeled. Filled Date: 07 / 02 / 2024 Last Medication intake:  Today  Medication #2: Oxycodone IR Pill/Patch Count:  19 of 60 pills remain Pill/Patch Appearance: Markings consistent with prescribed medication Bottle Appearance: Standard pharmacy container. Clearly labeled. Filled Date: 07 / 02 / 2024 Last Medication intake:  TodaySafety precautions to be maintained throughout the outpatient stay will include: orient to surroundings, keep bed in low position, maintain call bell within reach at all times, provide assistance with transfer out of bed and ambulation.

## 2022-12-23 DIAGNOSIS — K080 Exfoliation of teeth due to systemic causes: Secondary | ICD-10-CM | POA: Diagnosis not present

## 2023-01-11 ENCOUNTER — Other Ambulatory Visit: Payer: Self-pay | Admitting: Family Medicine

## 2023-01-11 DIAGNOSIS — E1169 Type 2 diabetes mellitus with other specified complication: Secondary | ICD-10-CM

## 2023-01-11 NOTE — Telephone Encounter (Signed)
Medication Refill - Medication: Semaglutide, 1 MG/DOSE, 4 MG/3ML SOPN   Has the patient contacted their pharmacy? Yes.    Preferred Pharmacy (with phone number or street name):  [id number] - [place], [place] - [address] AT [facility] OF [address] [facility] Phone: [phone]  Fax: [phone]     Has the patient been seen for an appointment in the last year OR does the patient have an upcoming appointment? Yes.    Agent: Please be advised that RX refills may take up to 3 business days. We ask that you follow-up with your pharmacy.

## 2023-01-12 MED ORDER — SEMAGLUTIDE (1 MG/DOSE) 4 MG/3ML ~~LOC~~ SOPN: 1.0000 mg | PEN_INJECTOR | SUBCUTANEOUS | 0 refills | Status: DC

## 2023-01-12 NOTE — Telephone Encounter (Signed)
Requested Prescriptions  Pending Prescriptions Disp Refills   Semaglutide, 1 MG/DOSE, 4 MG/3ML SOPN 9 mL 0    Sig: Inject 1 mg as directed once a week.     Endocrinology:  Diabetes - GLP-1 Receptor Agonists - semaglutide Passed - 01/11/2023 11:26 AM      Passed - HBA1C in normal range and within 180 days    Hemoglobin A1C  Date Value Ref Range Status  10/29/2022 5.3 4.0 - 5.6 % Final   HbA1c, POC (controlled diabetic range)  Date Value Ref Range Status  01/31/2019 5.8 0.0 - 7.0 % Final         Passed - Cr in normal range and within 360 days    Creat  Date Value Ref Range Status  04/30/2022 0.84 0.70 - 1.35 mg/dL Final   Creatinine, Urine  Date Value Ref Range Status  04/30/2022 274 20 - 320 mg/dL Final         Passed - Valid encounter within last 6 months    Recent Outpatient Visits           2 months ago Dyslipidemia associated with type 2 diabetes mellitus [facility])   [facility] Alba Cory, MD   8 months ago Dyslipidemia associated with type 2 diabetes mellitus [facility])   [facility] Alba Cory, MD   1 year ago Dyslipidemia associated with type 2 diabetes mellitus [facility])   [facility] Alba Cory, MD   1 year ago Well adult exam   [facility] Alba Cory, MD   1 year ago Dyslipidemia associated with type 2 diabetes mellitus [facility])   [facility] Alba Cory, MD       Future Appointments             In 2 months Carlynn Purl, Danna Hefty, MD [facility]

## 2023-01-26 ENCOUNTER — Encounter: Payer: Medicare Other | Admitting: Student in an Organized Health Care Education/Training Program

## 2023-02-02 ENCOUNTER — Encounter: Payer: Medicare Other | Admitting: Student in an Organized Health Care Education/Training Program

## 2023-02-09 ENCOUNTER — Ambulatory Visit
Payer: Medicare Other | Attending: Student in an Organized Health Care Education/Training Program | Admitting: Student in an Organized Health Care Education/Training Program

## 2023-02-09 ENCOUNTER — Encounter: Payer: Self-pay | Admitting: Student in an Organized Health Care Education/Training Program

## 2023-02-09 VITALS — BP 125/66 | HR 72 | Temp 97.7°F | Resp 16 | Ht 72.0 in | Wt 230.0 lb

## 2023-02-09 DIAGNOSIS — Z79891 Long term (current) use of opiate analgesic: Secondary | ICD-10-CM | POA: Diagnosis not present

## 2023-02-09 DIAGNOSIS — Z981 Arthrodesis status: Secondary | ICD-10-CM | POA: Diagnosis not present

## 2023-02-09 DIAGNOSIS — M5410 Radiculopathy, site unspecified: Secondary | ICD-10-CM | POA: Diagnosis not present

## 2023-02-09 DIAGNOSIS — M48062 Spinal stenosis, lumbar region with neurogenic claudication: Secondary | ICD-10-CM | POA: Insufficient documentation

## 2023-02-09 DIAGNOSIS — G894 Chronic pain syndrome: Secondary | ICD-10-CM | POA: Diagnosis not present

## 2023-02-09 MED ORDER — OXYCODONE-ACETAMINOPHEN 10-325 MG PO TABS: 1.0000 | ORAL_TABLET | Freq: Two times a day (BID) | ORAL | 0 refills | Status: DC | PRN

## 2023-02-09 MED ORDER — OXYCODONE HCL ER 20 MG PO T12A: 20.0000 mg | EXTENDED_RELEASE_TABLET | Freq: Two times a day (BID) | ORAL | 0 refills | Status: AC

## 2023-02-09 MED ORDER — OXYCODONE HCL ER 20 MG PO T12A: 20.0000 mg | EXTENDED_RELEASE_TABLET | Freq: Two times a day (BID) | ORAL | 0 refills | Status: DC

## 2023-02-09 MED ORDER — TIZANIDINE HCL 4 MG PO TABS: 4.0000 mg | ORAL_TABLET | Freq: Two times a day (BID) | ORAL | 5 refills | Status: DC | PRN

## 2023-02-09 NOTE — Patient Instructions (Signed)
Medication Rules  Applies to: All patients receiving prescriptions (written or electronic).  Pharmacy of record: Pharmacy where electronic prescriptions will be sent. If written prescriptions are taken to a different pharmacy, please inform the nursing staff. The pharmacy listed in the electronic medical record should be the one where you would like electronic prescriptions to be sent.  Prescription refills: Only during scheduled appointments. Applies to both, written and electronic prescriptions.  NOTE: The following applies primarily to controlled substances (Opioid* Pain Medications).   Patient's responsibilities: Pain Pills: Bring all pain pills to every appointment (except for procedure appointments). Pill Bottles: Bring pills in original pharmacy bottle. Always bring newest bottle. Bring bottle, even if empty. Medication refills: You are responsible for knowing and keeping track of what medications you need refilled. The day before your appointment, write a list of all prescriptions that need to be refilled. Bring that list to your appointment and give it to the admitting nurse. Prescriptions will be written only during appointments. If you forget a medication, it will not be "Called in", "Faxed", or "electronically sent". You will need to get another appointment to get these prescribed. Prescription Accuracy: You are responsible for carefully inspecting your prescriptions before leaving our office. Have the discharge nurse carefully go over each prescription with you, before taking them home. Make sure that your name is accurately spelled, that your address is correct. Check the name and dose of your medication to make sure it is accurate. Check the number of pills, and the written instructions to make sure they are clear and accurate. Make sure that you are given enough medication to last until your next medication refill appointment. Taking Medication: Take medication as prescribed. Never  take more pills than instructed. Never take medication more frequently than prescribed. Taking less pills or less frequently is permitted and encouraged, when it comes to controlled substances (written prescriptions).  Inform other Doctors: Always inform, all of your healthcare providers, of all the medications you take. Pain Medication from other Providers: You are not allowed to accept any additional pain medication from any other Doctor or Healthcare provider. There are two exceptions to this rule. (see below) In the event that you require additional pain medication, you are responsible for notifying us, as stated below. Medication Agreement: You are responsible for carefully reading and following our Medication Agreement. This must be signed before receiving any prescriptions from our practice. Safely store a copy of your signed Agreement. Violations to the Agreement will result in no further prescriptions. (Additional copies of our Medication Agreement are available upon request.) Laws, Rules, & Regulations: All patients are expected to follow all Federal and State Laws, Statutes, Rules, & Regulations. Ignorance of the Laws does not constitute a valid excuse. The use of any illegal substances is prohibited. Adopted CDC guidelines & recommendations: Target dosing levels will be at or below 60 MME/day. Use of benzodiazepines** is not recommended.  Exceptions: There are only two exceptions to the rule of not receiving pain medications from other Healthcare Providers. Exception #1 (Emergencies): In the event of an emergency (i.e.: accident requiring emergency care), you are allowed to receive additional pain medication. However, you are responsible for: As soon as you are able, call our office [phone], at any time of the day or night, and leave a message stating your name, the date and nature of the emergency, and the name and dose of the medication prescribed. In the event that your call is answered  by a member of   our staff, make sure to document and save the date, time, and the name of the person that took your information.  Exception #2 (Planned Surgery): In the event that you are scheduled by another doctor or dentist to have any type of surgery or procedure, you are allowed (for a period no longer than 30 days), to receive additional pain medication, for the acute post-op pain. However, in this case, you are responsible for picking up a copy of our "Post-op Pain Management for Surgeons" handout, and giving it to your surgeon or dentist. This document is available at our office, and does not require an appointment to obtain it. Simply go to our office during business hours (Monday-Thursday from 8:00 AM to 4:00 PM) (Friday 8:00 AM to 12:00 Noon) or if you have a scheduled appointment with us, prior to your surgery, and ask for it by name. In addition, you will need to provide us with your name, name of your surgeon, type of surgery, and date of procedure or surgery.  *Opioid medications include: morphine, codeine, oxycodone, oxymorphone, hydrocodone, hydromorphone, meperidine, tramadol, tapentadol, buprenorphine, fentanyl, methadone. **Benzodiazepine medications include: diazepam (Valium), alprazolam (Xanax), clonazepam (Klonopine), lorazepam (Ativan), clorazepate (Tranxene), chlordiazepoxide (Librium), estazolam (Prosom), oxazepam (Serax), temazepam (Restoril), triazolam (Halcion) (Last updated: 06/03/2017)  

## 2023-02-09 NOTE — Progress Notes (Signed)
PROVIDER NOTE: Information contained herein reflects review and annotations entered in association with encounter. Interpretation of such information and data should be left to medically-trained personnel. Information provided to patient can be located elsewhere in the medical record under "Patient Instructions". Document created using STT-dictation technology, any transcriptional errors that may result from process are unintentional.    Patient: Lawrence Thornton.  Service Category: E/M  Provider: Edward Jolly, MD  DOB: [birth year]  DOS: 02/09/2023  Referring Provider: Alba Cory, MD  MRN: [id number]  Specialty: Interventional Pain Management  PCP: Alba Cory, MD  Type: Established Patient  Setting: Ambulatory outpatient    Location: Office  Delivery: Face-to-face     HPI  [name]., a 67 y.o. year old male, is here today because of his Chronic pain syndrome [G89.4]. [name] primary complain today is Back Pain Last encounter: My last encounter with him was on 11/03/22 Pertinent problems: [name] has Chronic right-sided low back pain without sciatica; Continuous opioid dependence (HCC); S/P knee replacement; S/P lumbar fusion; Osteoarthritis of acromioclavicular joint; Long term current use of opiate analgesic; Lumbar spondylosis; Morbid obesity (HCC); and Chronic use of opiate for therapeutic purpose on their pertinent problem list. Pain Assessment: Severity of Chronic pain is reported as a 4 /10. Location: Back Lower, Right/Denies. Onset: More than a month ago. Quality: Constant, Aching, Dull, Sharp. Timing: Constant. Modifying factor(s): Pain medication and walking. Vitals:  height is 6' (1.829 m) and weight is 230 lb (104.3 kg). His temporal temperature is 97.7 F (36.5 C). His blood pressure is 125/66 and his pulse is 72. His respiration is 16 and oxygen saturation is 96%.  BMI: Estimated body mass index is 31.19 kg/m as calculated from the following:    Height as of this encounter: 6' (1.829 m).   Weight as of this encounter: 230 lb (104.3 kg).  Reason for encounter: medication management  No change in medical history since last visit.  Patient's pain is at baseline.  Patient continues multimodal pain regimen as prescribed.  States that it provides pain relief and improvement in functional status. No recent radicular pain flares Was weaned from a total MME >130 to 90 now.    Pharmacotherapy Assessment  Analgesic: Oxycontin ER 20 mg BID, Oxycodone 10 mg BID PRN breakthrough pain  Monitoring: [place] PMP: PDMP reviewed during this encounter.       Pharmacotherapy: No side-effects or adverse reactions reported. Compliance: No problems identified. Effectiveness: Clinically acceptable.  Earlyne Iba, RN  02/09/2023  8:05 AM  Sign when Signing Visit Nursing Pain Medication Assessment:  Safety precautions to be maintained throughout the outpatient stay will include: orient to surroundings, keep bed in low position, maintain call bell within reach at all times, provide assistance with transfer out of bed and ambulation.   Medication Inspection Compliance: Pill count conducted under aseptic conditions, in front of the patient. Neither the pills nor the bottle was removed from the patient's sight at any time. Once count was completed pills were immediately returned to the patient in their original bottle.  Medication #1: Oxycotin 20mg  Pill/Patch Count:  4 of 60 pills remain Pill/Patch Appearance: Markings consistent with prescribed medication Bottle Appearance: Standard pharmacy container. Clearly labeled. Filled Date: 10 / 02 / 2024 Last Medication intake:  Today  Medication #2: Oxycodone/APAP Pill/Patch Count:  5 of 60 pills remain Pill/Patch Appearance: Markings consistent with prescribed medication Bottle Appearance: Standard pharmacy container. Clearly labeled. Filled Date: 10 / 02 / 2024 Last  Medication intake:  Today    No results  found for: "CBDTHCR" No results found for: "D8THCCBX" No results found for: "D9THCCBX"  UDS:  Summary  Date Value Ref Range Status  07/23/2022 Note  Final    Comment:    ==================================================================== [facility] (MW) ==================================================================== Test                             Result       Flag       Units  Drug Present and Declared for Prescription Verification   Oxycodone                      3671         EXPECTED   ng/mg creat   Oxymorphone                    2119         EXPECTED   ng/mg creat   Noroxycodone                   >3968        EXPECTED   ng/mg creat   Noroxymorphone                 1881         EXPECTED   ng/mg creat    Sources of oxycodone are scheduled prescription medications.    Oxymorphone, noroxycodone, and noroxymorphone are expected    metabolites of oxycodone. Oxymorphone is also available as a    scheduled prescription medication.  ==================================================================== Test                      Result    Flag   Units      Ref Range   Creatinine              252              mg/dL      >=09 ==================================================================== Declared Medications:  The flagging and interpretation on this report are based on the  following declared medications.  Unexpected results may arise from  inaccuracies in the declared medications.   **Note: The testing scope of this panel includes these medications:   Oxycodone (Oxycontin)  Oxycodone (Percocet)   **Note: The testing scope of this panel does not include the  following reported medications:   Acetaminophen (Percocet)  Amlodipine  Aspirin  Atorvastatin (Lipitor)  Hydrochlorothiazide  Linaclotide (Linzess)  Naloxone (Narcan)  Omega-3 Fatty Acids  Polyethylene Glycol  Semaglutide  Sildenafil (Viagra)  Tamsulosin (Flomax)  Tizanidine (Zanaflex)   Valsartan ==================================================================== For clinical consultation, please call [phone]. ====================================================================       ROS  Constitutional: Denies any fever or chills Gastrointestinal: No reported hemesis, hematochezia, vomiting, or acute GI distress Musculoskeletal:  Low back, left leg pain Neurological: No reported episodes of acute onset apraxia, aphasia, dysarthria, agnosia, amnesia, paralysis, loss of coordination, or loss of consciousness  Medication Review  Semaglutide (1 MG/DOSE), amLODIPine-Valsartan-HCTZ, aspirin EC, atorvastatin, linaclotide, naloxone, omega-3 acid ethyl esters, oxyCODONE, oxyCODONE-acetaminophen, sildenafil, tamsulosin, and tiZANidine  History Review  Allergy: [name] has No Known Allergies. Drug: [name]  reports no history of drug use. Alcohol:  reports no history of alcohol use. Tobacco:  reports that he quit smoking about 23 years ago. His smoking use included cigarettes. He started smoking about 43 years ago. He  has a 30 pack-year smoking history. He has never used smokeless tobacco. Social: [name]  reports that he quit smoking about 23 years ago. His smoking use included cigarettes. He started smoking about 43 years ago. He has a 30 pack-year smoking history. He has never used smokeless tobacco. He reports that he does not drink alcohol and does not use drugs. Medical:  has a past medical history of Arthritis and Hypertension. Surgical: [name]  has a past surgical history that includes Back surgery (lower back surgery x 3 ); Appendectomy (age 14); sebaceous cyst removed (Right, 15 years ago); Toe Fusion (Right); metal removed from eye (years ago); Total knee arthroplasty (Left, 08/10/2014); Total knee arthroplasty (Right, 12/21/2014); Total knee arthroplasty (Right, 12/21/14); Lumbar fusion (1999); Colonoscopy with propofol (N/A, 05/04/2017);  arthroscopic shoulder surgery (Right, 08/31/2017); and Colonoscopy with propofol (N/A, 06/30/2022). Family: family history includes Cancer in his mother; Heart attack in his mother; Hyperlipidemia in his father; Hypertension in his father.  Laboratory Chemistry Profile   Renal Lab Results  Component Value Date   BUN 13 04/30/2022   CREATININE 0.84 04/30/2022   LABCREA 274 04/30/2022   BCR SEE NOTE: 04/30/2022   GFRAA 108 05/03/2020   GFRNONAA 93 05/03/2020    Hepatic Lab Results  Component Value Date   AST 20 04/30/2022   ALT 23 04/30/2022   ALBUMIN 4.6 11/11/2015   ALKPHOS 76 11/11/2015   HCVAB NEGATIVE 10/06/2016    Electrolytes Lab Results  Component Value Date   NA 137 04/30/2022   K 3.9 04/30/2022   CL 99 04/30/2022   CALCIUM 9.7 04/30/2022    Bone Lab Results  Component Value Date   VD25OH 43 10/06/2016    Inflammation (CRP: Acute Phase) (ESR: Chronic Phase) No results found for: "CRP", "ESRSEDRATE", "LATICACIDVEN"       Note: Above Lab results reviewed.  Recent Imaging Review  MR LUMBAR SPINE W WO CONTRAST CLINICAL DATA:  67 year old male with persistent low back pain. Left leg radicular pain. Prior surgery.  EXAM: MRI LUMBAR SPINE WITHOUT AND WITH CONTRAST  TECHNIQUE: Multiplanar and multiecho pulse sequences of the lumbar spine were obtained without and with intravenous contrast.  CONTRAST:  10mL GADAVIST GADOBUTROL 1 MMOL/ML IV SOLN  COMPARISON:  Lumbar radiographs 03/16/2013.  FINDINGS: Segmentation:  Normal on the comparison.  Alignment: Straightening of lumbar lordosis not significantly changed since 2014. No significant scoliosis or spondylolisthesis.  Vertebrae: Abundant susceptibility artifact associated with chronic posterior spinal fusion hardware L3 through the sacrum.  Evidence of solid arthrodesis at L4-L5 and L5-S1, and possibly at the posterior elements of L3-L4. Advanced mostly chronic appearing degenerative endplate marrow  changes at L1-L2, see details below. Background bone marrow signal within normal limits. No definite No acute osseous abnormality identified.  Conus medullaris and cauda equina: Conus extends to the L1, extending toward the L1-L2 disc level. Degenerative conus mass effect (series 5, image 10), but no lower spinal cord or conus signal abnormality.  No abnormal intradural enhancement or dural thickening is evident.  Paraspinal and other soft tissues: Postoperative changes to the posterior lumbar paraspinal soft tissues. Negative visible abdominal viscera.  Disc levels:  T12-L1: Some disc space loss with bulky circumferential disc osteophyte complex. Broad-based posterior component and mild to moderate facet and ligament flavum hypertrophy greater on the left. Mild spinal stenosis, with up to mild mass effect on the lower thoracic spinal cord here (series 5, image 10). Mild left lateral recess stenosis (left L1 nerve level). And moderate  to severe multifactorial left T12 foraminal stenosis (series 5, image 12).  L1-L2: Severe disc space loss, bulky circumferential disc osteophyte complex. Broad-based posterior component. Relatively mild facet and ligament flavum hypertrophy. Some epidural lipomatosis. Severe spinal stenosis (series 8, image 9) with some mass effect on the conus. Moderate to severe bilateral L1 foraminal stenosis.  L2-L3: Better preserved disc space, but circumferential disc osteophyte complex with a bulky and broad-based posterior component eccentric to the left. Partially visible at least moderate facet and ligament flavum hypertrophy with some epidural lipomatosis.  Severe spinal stenosis, with lateral recess stenosis greater on the left (series 8, image 18, descending left L3 nerve level). Mild to moderate L2 foraminal stenosis greater on the left.  L3-L4:  Prior decompression and fusion.  No adverse features.  L4-L5: Prior decompression and fusion. Evidence  of solid arthrodesis, no adverse features.  L5-S1: Prior decompression and fusion. Evidence of solid arthrodesis. No adverse features.  IMPRESSION: 1. Prior decompression and fusion from L3-L4 through L5-S1 with no adverse features.  2. But bulky disc and endplate degeneration elsewhere T12-L1 through the adjacent L2-L3 segment. Subsequent moderate to severe multifactorial spinal stenosis at L1-L2 (level of the conus), and L2-L3 (left lateral recess more affected). Mild spinal stenosis at T12-L1. Associated mass effect on the lower thoracic spinal cord and conus, but no cord/conus signal abnormality.  3. Associated moderate to severe left T12 and bilateral L1 neural foraminal stenosis. Up to moderate left L2 foraminal stenosis.  Electronically Signed   By: Odessa Fleming M.D.   On: 05/02/2022 08:00 Note: Reviewed        Physical Exam  General appearance: Well nourished, well developed, and well hydrated. In no apparent acute distress Mental status: Alert, oriented x 3 (person, place, & time)       Respiratory: No evidence of acute respiratory distress Eyes: PERLA Vitals: BP 125/66   Pulse 72   Temp 97.7 F (36.5 C) (Temporal)   Resp 16   Ht 6' (1.829 m)   Wt 230 lb (104.3 kg)   SpO2 96%   BMI 31.19 kg/m  BMI: Estimated body mass index is 31.19 kg/m as calculated from the following:   Height as of this encounter: 6' (1.829 m).   Weight as of this encounter: 230 lb (104.3 kg). Ideal: Ideal body weight: 77.6 kg (171 lb 1.2 oz) Adjusted ideal body weight: 88.3 kg (194 lb 10.3 oz)  Lumbar Spine Area Exam  Skin & Axial Inspection: Well healed scar from previous spine surgery detected Alignment: Symmetrical Functional ROM: Pain restricted ROM affecting both sides Stability: No instability detected Muscle Tone/Strength: Functionally intact. No obvious neuro-muscular anomalies detected. Sensory (Neurological): Dermatomal with radiation into left buttock and left lateral thigh    Gait & Posture Assessment  Ambulation: Unassisted Gait: Relatively normal for age and body habitus Posture: WNL  Lower Extremity Exam      Side: Right lower extremity   Side: Left lower extremity  Stability: No instability observed           Stability: No instability observed          Skin & Extremity Inspection: Skin color, temperature, and hair growth are WNL. No peripheral edema or cyanosis. No masses, redness, swelling, asymmetry, or associated skin lesions. No contractures.   Skin & Extremity Inspection: Skin color, temperature, and hair growth are WNL. No peripheral edema or cyanosis. No masses, redness, swelling, asymmetry, or associated skin lesions. No contractures.  Functional ROM: Unrestricted ROM  Functional ROM: Unrestricted ROM                  Muscle Tone/Strength: Functionally intact. No obvious neuro-muscular anomalies detected.   Muscle Tone/Strength: Functionally intact. No obvious neuro-muscular anomalies detected.  Sensory (Neurological): Unimpaired         Sensory (Neurological): Dermatomal       DTR: Patellar: deferred today Achilles: deferred today Plantar: deferred today   DTR: Patellar: deferred today Achilles: deferred today Plantar: deferred today  Palpation: No palpable anomalies   Palpation: No palpable anomalies    Assessment   Diagnosis Status  1. Chronic pain syndrome   2. Chronic use of opiate for therapeutic purpose   3. Spinal stenosis, lumbar region, with neurogenic claudication   4. S/P lumbar fusion   5. Radicular pain of left lower extremity     Controlled Controlled Controlled    Plan of Care   1. Chronic pain syndrome - oxyCODONE-acetaminophen (PERCOCET) 10-325 MG tablet; Take 1 tablet by mouth 2 (two) times daily as needed for pain.  Dispense: 60 tablet; Refill: 0 - oxyCODONE-acetaminophen (PERCOCET) 10-325 MG tablet; Take 1 tablet by mouth 2 (two) times daily as needed for pain.  Dispense: 60 tablet; Refill: 0 -  oxyCODONE-acetaminophen (PERCOCET) 10-325 MG tablet; Take 1 tablet by mouth 2 (two) times daily as needed for pain.  Dispense: 60 tablet; Refill: 0 - oxyCODONE (OXYCONTIN) 20 mg 12 hr tablet; Take 1 tablet (20 mg total) by mouth every 12 (twelve) hours. Months last 30 days.  Dispense: 60 tablet; Refill: 0 - oxyCODONE (OXYCONTIN) 20 mg 12 hr tablet; Take 1 tablet (20 mg total) by mouth every 12 (twelve) hours. Months last 30 days.  Dispense: 60 tablet; Refill: 0 - oxyCODONE (OXYCONTIN) 20 mg 12 hr tablet; Take 1 tablet (20 mg total) by mouth every 12 (twelve) hours. Months last 30 days.  Dispense: 60 tablet; Refill: 0 - tiZANidine (ZANAFLEX) 4 MG tablet; Take 1 tablet (4 mg total) by mouth 2 (two) times daily as needed for muscle spasms.  Dispense: 60 tablet; Refill: 5  2. Chronic use of opiate for therapeutic purpose - oxyCODONE-acetaminophen (PERCOCET) 10-325 MG tablet; Take 1 tablet by mouth 2 (two) times daily as needed for pain.  Dispense: 60 tablet; Refill: 0 - oxyCODONE-acetaminophen (PERCOCET) 10-325 MG tablet; Take 1 tablet by mouth 2 (two) times daily as needed for pain.  Dispense: 60 tablet; Refill: 0 - oxyCODONE-acetaminophen (PERCOCET) 10-325 MG tablet; Take 1 tablet by mouth 2 (two) times daily as needed for pain.  Dispense: 60 tablet; Refill: 0 - oxyCODONE (OXYCONTIN) 20 mg 12 hr tablet; Take 1 tablet (20 mg total) by mouth every 12 (twelve) hours. Months last 30 days.  Dispense: 60 tablet; Refill: 0 - oxyCODONE (OXYCONTIN) 20 mg 12 hr tablet; Take 1 tablet (20 mg total) by mouth every 12 (twelve) hours. Months last 30 days.  Dispense: 60 tablet; Refill: 0 - oxyCODONE (OXYCONTIN) 20 mg 12 hr tablet; Take 1 tablet (20 mg total) by mouth every 12 (twelve) hours. Months last 30 days.  Dispense: 60 tablet; Refill: 0  3. Spinal stenosis, lumbar region, with neurogenic claudication  4. S/P lumbar fusion  5. Radicular pain of left lower extremity   Have discussed spinal injections and  spinal cord stimulator for chronic pain management Discussed the importance of not escalating opioid medications due to opioid induced hyperalgesia and central sensitization syndrome    Follow-up plan:   Return in about 3 months (around 05/12/2023) for  MM, F2F.    Recent Visits No visits were found meeting these conditions. Showing recent visits within past 90 days and meeting all other requirements Today's Visits Date Type Provider Dept  02/09/23 Office Visit Edward Jolly, MD [facility]  Showing today's visits and meeting all other requirements Future Appointments No visits were found meeting these conditions. Showing future appointments within next 90 days and meeting all other requirements  I discussed the assessment and treatment plan with the patient. The patient was provided an opportunity to ask questions and all were answered. The patient agreed with the plan and demonstrated an understanding of the instructions.  Patient advised to call back or seek an in-person evaluation if the symptoms or condition worsens.  Duration of encounter: .  Total time on encounter, as per AMA guidelines included both the face-to-face and non-face-to-face time personally spent by the physician and/or other qualified health care professional(s) on the day of the encounter (includes time in activities that require the physician or other qualified health care professional and does not include time in activities normally performed by clinical staff). Physician's time may include the following activities when performed: Preparing to see the patient (e.g., pre-charting review of records, searching for previously ordered imaging, lab work, and nerve conduction tests) Review of prior analgesic pharmacotherapies. Reviewing PMP Interpreting ordered tests (e.g., lab work, imaging, nerve conduction tests) Performing post-procedure evaluations, including interpretation of diagnostic  procedures Obtaining and/or reviewing separately obtained history Performing a medically appropriate examination and/or evaluation Counseling and educating the patient/family/caregiver Ordering medications, tests, or procedures Referring and communicating with other health care professionals (when not separately reported) Documenting clinical information in the electronic or other health record Independently interpreting results (not separately reported) and communicating results to the patient/ family/caregiver Care coordination (not separately reported)  Note by: Edward Jolly, MD Date: 02/09/2023; Time: 8:15 AM

## 2023-02-09 NOTE — Progress Notes (Signed)
Nursing Pain Medication Assessment:  Safety precautions to be maintained throughout the outpatient stay will include: orient to surroundings, keep bed in low position, maintain call bell within reach at all times, provide assistance with transfer out of bed and ambulation.   Medication Inspection Compliance: Pill count conducted under aseptic conditions, in front of the patient. Neither the pills nor the bottle was removed from the patient's sight at any time. Once count was completed pills were immediately returned to the patient in their original bottle.  Medication #1: Oxycotin 20mg  Pill/Patch Count:  4 of 60 pills remain Pill/Patch Appearance: Markings consistent with prescribed medication Bottle Appearance: Standard pharmacy container. Clearly labeled. Filled Date: 10 / 02 / 2024 Last Medication intake:  Today  Medication #2: Oxycodone/APAP Pill/Patch Count:  5 of 60 pills remain Pill/Patch Appearance: Markings consistent with prescribed medication Bottle Appearance: Standard pharmacy container. Clearly labeled. Filled Date: 10 / 02 / 2024 Last Medication intake:  Today

## 2023-03-02 DIAGNOSIS — K080 Exfoliation of teeth due to systemic causes: Secondary | ICD-10-CM | POA: Diagnosis not present

## 2023-03-12 NOTE — Progress Notes (Signed)
Name: Lawrence Thornton.   MRN: [id number]    DOB: January 10, 1956   Date:03/24/2023       Progress Note  Subjective  Chief Complaint  Chief Complaint  Patient presents with   Medical Management of Chronic Issues    HPI  Discussed the use of AI scribe software for clinical note transcription with the patient, who gave verbal consent to proceed.  History of Present Illness   The patient, a 67 year old with a history of diabetes, hypertension, and benign prostatic hyperplasia (BPH), presents for a regular follow-up appointment. He has been managing his diabetes with Ozempic 1mg  once a week and reports a recent increase in appetite. He also notes a decrease in hair and nail growth since starting the medication, but denies any hair loss. Despite the increased appetite, the patient's weight has remained relatively stable, fluctuating between 230 and 240 pounds over the past year.  The patient's hypertension is well-controlled with amlodipine, valsartan, and HCTZ. He denies any symptoms of chest pain, shortness of breath, or dizziness. His BPH is managed with tamsulosin, and he reports waking up twice a night to urinate, which he believes is an improvement due to the medication.  The patient also has a history of chronic pain, for which he takes oxycodone and OxyContin. He reports some constipation as a side effect of the opioids, which he manages with Linzess as needed. He also takes atorvastatin for cholesterol control and omega-3 supplements.  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 He copes with this stress by staying active, working on projects such as painting cars, and playing golf. He quit smoking 25 years ago and has not experienced any residual cough or other respiratory symptoms since quitting.         Patient Active Problem List   Diagnosis Date Noted   Spinal stenosis, lumbar region, with neurogenic claudication 05/20/2022    Radicular pain of left lower extremity 04/21/2022   Chronic use of opiate for therapeutic purpose 12/15/2018   Morbid obesity (HCC) 07/28/2018   Lumbar spondylosis 03/15/2018   Long term current use of opiate analgesic 12/15/2017   Dyslipidemia associated with type 2 diabetes mellitus (HCC) 08/13/2017   Osteoarthritis of acromioclavicular joint 07/30/2017   S/P lumbar fusion 07/13/2017   Chronic pain syndrome 07/13/2017   History of colonic polyps    Constipation due to opioid therapy 07/18/2015   Hypertriglyceridemia 01/22/2015   S/P knee replacement 12/21/2014   Chronic right-sided low back pain without sciatica 09/21/2014   BPH associated with nocturia 09/21/2014   Dyslipidemia 09/21/2014   Continuous opioid dependence (HCC) 09/21/2014   Lumbar degenerative disc disease 09/21/2014   Failure of erection 09/21/2014    Past Surgical History:  Procedure Laterality Date   APPENDECTOMY  age 57   arthroscopic shoulder surgery Right 08/31/2017   BACK SURGERY  lower back surgery x 3    L 3 L 4 and L 5 are fused together   COLONOSCOPY WITH PROPOFOL N/A 05/04/2017   Procedure: COLONOSCOPY WITH PROPOFOL;  Surgeon: Midge Minium, MD;  Location: [facility] ENDOSCOPY;  Service: Endoscopy;  Laterality: N/A;   COLONOSCOPY WITH PROPOFOL N/A 06/30/2022   Procedure: COLONOSCOPY WITH PROPOFOL;  Surgeon: Midge Minium, MD;  Location: [facility] ENDOSCOPY;  Service: Endoscopy;  Laterality: N/A;   LUMBAR FUSION  1999   L- to L5   metal removed from eye  years ago   sebaceous cyst removed Right 15 years ago  middle  finger    TOE FUSION Right    4th toe   TOTAL KNEE ARTHROPLASTY Left 08/10/2014   Procedure: LEFT TOTAL KNEE ARTHROPLASTY;  Surgeon: Eugenia Mcalpine, MD;  Location: [facility] ORS;  Service: Orthopedics;  Laterality: Left;   TOTAL KNEE ARTHROPLASTY Right 12/21/2014   Procedure: RIGHT TOTAL KNEE ARTHROPLASTY;  Surgeon: Eugenia Mcalpine, MD;  Location: [facility] ORS;  Service: Orthopedics;  Laterality: Right;   TOTAL  KNEE ARTHROPLASTY Right 12/21/14    Family History  Problem Relation Age of Onset   Cancer Mother        Throat   Heart attack Mother    Hypertension Father    Hyperlipidemia Father     Social History   Tobacco Use   Smoking status: Former    Current packs/day: 0.00    Average packs/day: 1.5 packs/day for 20.0 years (30.0 ttl pk-yrs)    Types: Cigarettes    Start date: 04/07/1979    Quit date: 04/07/1999    Years since quitting: 23.9   Smokeless tobacco: Never   Tobacco comments:    not smoking   Substance Use Topics   Alcohol use: No     Current Outpatient Medications:    amLODIPine-Valsartan-HCTZ 10-320-25 MG TABS, One tablet by mouth once daily, Disp: 90 tablet, Rfl: 1   aspirin EC 81 MG tablet, Take 1 tablet (81 mg total) by mouth daily., Disp: 30 tablet, Rfl: 0   atorvastatin (LIPITOR) 20 MG tablet, Take 1 tablet (20 mg total) by mouth daily., Disp: 90 tablet, Rfl: 1   linaclotide (LINZESS) 72 MCG capsule, Take 1 capsule (72 mcg total) by mouth daily before breakfast., Disp: 90 capsule, Rfl: 1   naloxone (NARCAN) 2 MG/2ML injection, Inject 1 mL (1 mg total) into the muscle as needed for up to 2 doses (for opioid overdose). Inject content of syringe into thigh muscle. Call 911., Disp: 2 mL, Rfl: 1   omega-3 acid ethyl esters (LOVAZA) 1 g capsule, Take 2 capsules (2 g total) by mouth 2 (two) times daily., Disp: 360 capsule, Rfl: 1   oxyCODONE (OXYCONTIN) 20 mg 12 hr tablet, Take 1 tablet (20 mg total) by mouth every 12 (twelve) hours. Months last 30 days., Disp: 60 tablet, Rfl: 0   [START ON 04/12/2023] oxyCODONE (OXYCONTIN) 20 mg 12 hr tablet, Take 1 tablet (20 mg total) by mouth every 12 (twelve) hours. Months last 30 days., Disp: 60 tablet, Rfl: 0   oxyCODONE-acetaminophen (PERCOCET) 10-325 MG tablet, Take 1 tablet by mouth 2 (two) times daily as needed for pain., Disp: 60 tablet, Rfl: 0   [START ON 04/12/2023] oxyCODONE-acetaminophen (PERCOCET) 10-325 MG tablet, Take 1 tablet by  mouth 2 (two) times daily as needed for pain., Disp: 60 tablet, Rfl: 0   Semaglutide, 1 MG/DOSE, 4 MG/3ML SOPN, Inject 1 mg as directed once a week., Disp: 9 mL, Rfl: 0   sildenafil (VIAGRA) 100 MG tablet, Take 1 tablet (100 mg total) by mouth daily as needed for erectile dysfunction., Disp: 30 tablet, Rfl: 0   tamsulosin (FLOMAX) 0.4 MG CAPS capsule, Take 1 capsule (0.4 mg total) by mouth daily., Disp: 90 capsule, Rfl: 1   tiZANidine (ZANAFLEX) 4 MG tablet, Take 1 tablet (4 mg total) by mouth 2 (two) times daily as needed for muscle spasms., Disp: 60 tablet, Rfl: 5   oxyCODONE-acetaminophen (PERCOCET) 10-325 MG tablet, Take 1 tablet by mouth 2 (two) times daily as needed for pain., Disp: 60 tablet, Rfl: 0  No Known  Allergies  I personally reviewed active problem list, medication list, allergies, family history with the patient/caregiver today.   ROS  Ten systems reviewed and is negative except as mentioned in HPI    Objective  Vitals:   03/24/23 0803  BP: 124/70  Pulse: 87  Resp: 16  Temp: 97.7 F (36.5 C)  TempSrc: Oral  SpO2: 98%  Weight: 240 lb 6.4 oz (109 kg)  Height: 6' (1.829 m)    Body mass index is 32.6 kg/m.  Physical Exam  Constitutional: Patient appears well-developed and well-nourished. Obese  No distress.  HEENT: head atraumatic, normocephalic, pupils equal and reactive to light, neck supple Cardiovascular: Normal rate, regular rhythm and normal heart sounds.  No murmur heard. No BLE edema. Pulmonary/Chest: Effort normal and breath sounds normal. No respiratory distress. Abdominal: Soft.  There is no tenderness. Psychiatric: Patient has a normal mood and affect. behavior is normal. Judgment and thought content normal.   Recent Results (from the past 2160 hours)  POCT glycosylated hemoglobin (Hb A1C)     Status: None   Collection Time: 03/24/23  8:08 AM  Result Value Ref Range   Hemoglobin A1C 5.6 4.0 - 5.6 %   HbA1c POC (<> result, manual entry)      HbA1c, POC (prediabetic range)     HbA1c, POC (controlled diabetic range)        PHQ2/9:    03/24/2023    8:02 AM 02/09/2023    8:08 AM 11/03/2022    8:43 AM 10/29/2022    7:44 AM 07/30/2022    8:33 AM  Depression screen PHQ 2/9  Decreased Interest 0 0 0 0 0  Down, Depressed, Hopeless 0 0 0 0 0  PHQ - 2 Score 0 0 0 0 0  Altered sleeping 0   0   Tired, decreased energy 0   0   Change in appetite 0   0   Feeling bad or failure about yourself  0   0   Trouble concentrating 0   0   Moving slowly or fidgety/restless 0   0   Suicidal thoughts 0   0   PHQ-9 Score 0   0   Difficult doing work/chores Not difficult at all        phq 9 is negative  Fall Risk:    03/24/2023    8:01 AM 02/09/2023    8:07 AM 11/03/2022    8:43 AM 10/29/2022    7:44 AM 07/30/2022    8:25 AM  Fall Risk   Falls in the past year? 0 0 0 0 0  Number falls in past yr: 0 0  0 0  Injury with Fall? 0 0  0 0  Risk for fall due to : No Fall Risks   No Fall Risks No Fall Risks  Follow up Falls prevention discussed;Education provided;Falls evaluation completed   Falls prevention discussed Falls prevention discussed    Assessment & Plan  Assessment and Plan    Type 2 Diabetes Mellitus Well controlled with Ozempic 1mg  weekly, A1c 5.6%. Noted increased appetite and slowed hair/nail growth as side effects. -Increase Ozempic to 2mg  weekly to help control appetite. -Continue monitoring blood glucose levels and report any changes.  Obesity Weight fluctuating between 230-240 lbs. Patient is actively trying to maintain weight and has a goal of staying around 230 lbs or less. -Encourage continued efforts in weight management and healthy eating habits.  Hypertension Well controlled on amlodipine/valsartan/HCTZ 10/320/25mg . No symptoms of chest  pain, shortness of breath, or dizziness. -Continue current medication regimen.  Benign Prostatic Hyperplasia (BPH) No current issues, urination frequency acceptable to  patient. On tamsulosin. -Continue tamsulosin as prescribed.  Chronic Pain/opioid dependence continuous  Managed by Dr. Lourdes Sledge with oxycodone and OxyContin. No constipation issues due to intermittent use of Linzess. -Continue current pain management plan with Dr. Lourdes Sledge. -Refill Linzess prescription for as-needed use.  Hyperlipidemia Well controlled on atorvastatin 20mg  and omega-3 1g twice daily. -Continue current medication regimen.  General Health Maintenance -Administer influenza vaccine today. -Order labs for end of January 2025, including PSA. -Schedule physical exam when due. -Encourage continued self-care and stress management activities.

## 2023-03-22 ENCOUNTER — Encounter: Payer: Self-pay | Admitting: Pharmacist

## 2023-03-22 NOTE — Progress Notes (Signed)
   03/22/2023  Patient ID: Lawrence Pauls., male   DOB: [birth year], 67 y.o.   MRN: [id number]  Pharmacy Quality Measure Review  This patient is appearing on a report for being at risk of failing the adherence measure for cholesterol (statin) and hypertension (ACEi/ARB) medications this calendar year.   Medication: atorvastatin 20 mg Last fill date: 10/29/2022 for 90 day supply  Medication: amlodipine/valsartan/HCTZ 10-320-25 mg Last fill date: 10/30/2022 for 90 day supply  Left voicemail for patient to return my call at their convenience.  Estelle Grumbles, PharmD, [facility] [phone]

## 2023-03-24 ENCOUNTER — Encounter: Payer: Self-pay | Admitting: Family Medicine

## 2023-03-24 ENCOUNTER — Ambulatory Visit (INDEPENDENT_AMBULATORY_CARE_PROVIDER_SITE_OTHER): Payer: Medicare Other | Admitting: Family Medicine

## 2023-03-24 VITALS — BP 124/70 | HR 87 | Temp 97.7°F | Resp 16 | Ht 72.0 in | Wt 240.4 lb

## 2023-03-24 DIAGNOSIS — Z23 Encounter for immunization: Secondary | ICD-10-CM | POA: Diagnosis not present

## 2023-03-24 DIAGNOSIS — F1120 Opioid dependence, uncomplicated: Secondary | ICD-10-CM | POA: Diagnosis not present

## 2023-03-24 DIAGNOSIS — T402X5A Adverse effect of other opioids, initial encounter: Secondary | ICD-10-CM

## 2023-03-24 DIAGNOSIS — E1169 Type 2 diabetes mellitus with other specified complication: Secondary | ICD-10-CM | POA: Diagnosis not present

## 2023-03-24 DIAGNOSIS — N401 Enlarged prostate with lower urinary tract symptoms: Secondary | ICD-10-CM

## 2023-03-24 DIAGNOSIS — R351 Nocturia: Secondary | ICD-10-CM

## 2023-03-24 DIAGNOSIS — E66811 Obesity, class 1: Secondary | ICD-10-CM

## 2023-03-24 DIAGNOSIS — K5903 Drug induced constipation: Secondary | ICD-10-CM

## 2023-03-24 DIAGNOSIS — I152 Hypertension secondary to endocrine disorders: Secondary | ICD-10-CM

## 2023-03-24 DIAGNOSIS — Z7985 Long-term (current) use of injectable non-insulin antidiabetic drugs: Secondary | ICD-10-CM

## 2023-03-24 DIAGNOSIS — G894 Chronic pain syndrome: Secondary | ICD-10-CM | POA: Diagnosis not present

## 2023-03-24 DIAGNOSIS — E1159 Type 2 diabetes mellitus with other circulatory complications: Secondary | ICD-10-CM | POA: Diagnosis not present

## 2023-03-24 DIAGNOSIS — E785 Hyperlipidemia, unspecified: Secondary | ICD-10-CM

## 2023-03-24 LAB — POCT GLYCOSYLATED HEMOGLOBIN (HGB A1C): Hemoglobin A1C: 5.6 % (ref 4.0–5.6)

## 2023-03-24 MED ORDER — TAMSULOSIN HCL 0.4 MG PO CAPS: 0.4000 mg | ORAL_CAPSULE | Freq: Every day | ORAL | 1 refills | Status: DC

## 2023-03-24 MED ORDER — ATORVASTATIN CALCIUM 20 MG PO TABS: 20.0000 mg | ORAL_TABLET | Freq: Every day | ORAL | 1 refills | Status: DC

## 2023-03-24 MED ORDER — SEMAGLUTIDE (2 MG/DOSE) 8 MG/3ML ~~LOC~~ SOPN: 2.0000 mg | PEN_INJECTOR | SUBCUTANEOUS | 1 refills | Status: DC

## 2023-03-24 MED ORDER — LINACLOTIDE 72 MCG PO CAPS: 72.0000 ug | ORAL_CAPSULE | Freq: Every day | ORAL | 1 refills | Status: DC

## 2023-03-24 MED ORDER — OMEGA-3-ACID ETHYL ESTERS 1 G PO CAPS: 2.0000 g | ORAL_CAPSULE | Freq: Two times a day (BID) | ORAL | 1 refills | Status: DC

## 2023-03-24 MED ORDER — AMLODIPINE-VALSARTAN-HCTZ 10-320-25 MG PO TABS: ORAL_TABLET | ORAL | 1 refills | Status: DC

## 2023-03-26 ENCOUNTER — Encounter: Payer: Self-pay | Admitting: Pharmacist

## 2023-03-26 NOTE — Progress Notes (Signed)
   03/26/2023  Patient ID: Lawrence Pauls., male   DOB: 1955-04-22, 67 y.o.   MRN: [id number]  Pharmacy Quality Measure Review  This patient is appearing on a report for being at risk of failing the adherence measure for cholesterol (statin) and hypertension (ACEi/ARB) medications this calendar year.   Medication: atorvastatin 20 mg Last fill date: 10/29/2022 for 90 day supply  Medication: amlodipine/valsartan/HCTZ 10-320-25 mg Last fill date: 10/30/2022 for 90 day supply  Reach patient by telephone today. Reports taking both atorvastatin and amlodipine/valsartan/HCTZ consistently; denies missed doses. Note patient seen for office visit with PCP on 03/24/2023 and refills of both medications sent to [facility] for patient. Patient advises that he will pick up these prescriptions and continue taking both as directed   Estelle Grumbles, PharmD, [facility] [phone]

## 2023-04-19 ENCOUNTER — Other Ambulatory Visit: Payer: Self-pay | Admitting: Family Medicine

## 2023-04-19 DIAGNOSIS — E1159 Type 2 diabetes mellitus with other circulatory complications: Secondary | ICD-10-CM

## 2023-04-19 NOTE — Telephone Encounter (Signed)
 Medication Refill -  Most Recent Primary Care Visit:  Provider: GLENARD MIRE  Department: [facility]  Visit Type: OFFICE VISIT  Date: 03/24/2023  Medication: amLODIPine -Valsartan -HCTZ 10-320-25 MG TABS   Has the patient contacted their pharmacy? No  Is this the correct pharmacy for this prescription? Yes  This is the patient's preferred pharmacy:  [id number] - [place], [place] - [address]  [place] [address] Phone: [phone] Fax: [phone]   Has the prescription been filled recently? Yes  Is the patient out of the medication? Yes  Has the patient been seen for an appointment in the last year OR does the patient have an upcoming appointment? Yes  Can we respond through MyChart? No  Agent: Please be advised that Rx refills may take up to 3 business days. We ask that you follow-up with your pharmacy.  Patient says he has not had his meds in over a week

## 2023-04-20 MED ORDER — AMLODIPINE-VALSARTAN-HCTZ 10-320-25 MG PO TABS: ORAL_TABLET | ORAL | 0 refills | Status: DC

## 2023-04-20 NOTE — Telephone Encounter (Signed)
 Change in pharmacy requested by patient. Requested Prescriptions  Pending Prescriptions Disp Refills   amLODIPine -Valsartan -HCTZ 10-320-25 MG TABS 90 tablet 0    Sig: One tablet by mouth once daily     Cardiovascular: CCB + ARB + Diuretic Combos Failed - 04/20/2023  3:01 PM      Failed - K in normal range and within 180 days    Potassium  Date Value Ref Range Status  04/30/2022 3.9 3.5 - 5.3 mmol/L Final         Failed - Na in normal range and within 180 days    Sodium  Date Value Ref Range Status  04/30/2022 137 135 - 146 mmol/L Final  11/11/2015 141 134 - 144 mmol/L Final         Failed - Cr in normal range and within 180 days    Creat  Date Value Ref Range Status  04/30/2022 0.84 0.70 - 1.35 mg/dL Final   Creatinine, Urine  Date Value Ref Range Status  04/30/2022 274 20 - 320 mg/dL Final         Failed - eGFR is 10 or above and within 180 days    GFR, Est African American  Date Value Ref Range Status  05/03/2020 108 > OR = 60 mL/min/1.12m2 Final   GFR, Est Non African American  Date Value Ref Range Status  05/03/2020 93 > OR = 60 mL/min/1.63m2 Final   eGFR  Date Value Ref Range Status  04/30/2022 96 > OR = 60 mL/min/1.33m2 Final         Passed - Patient is not pregnant      Passed - Last BP in normal range    BP Readings from Last 1 Encounters:  03/24/23 124/70         Passed - Last Heart Rate in normal range    Pulse Readings from Last 1 Encounters:  03/24/23 87         Passed - Valid encounter within last 6 months    Recent Outpatient Visits           3 weeks ago Dyslipidemia associated with type 2 diabetes mellitus [facility])   [place] [facility] Glenard Mire, MD   5 months ago Dyslipidemia associated with type 2 diabetes mellitus [facility])   [place] [facility] Glenard Mire, MD   11 months ago Dyslipidemia associated with type 2 diabetes mellitus [facility])   [place] [facility] Sowles, Krichna,  MD   1 year ago Dyslipidemia associated with type 2 diabetes mellitus [facility])   [place] [facility] Sowles, Krichna, MD   1 year ago Well adult exam   [facility] Glenard Mire, MD       Future Appointments             In 2 weeks Sowles, Krichna, MD [facility], [facility]

## 2023-05-04 ENCOUNTER — Encounter: Payer: Self-pay | Admitting: Family Medicine

## 2023-05-04 ENCOUNTER — Ambulatory Visit (INDEPENDENT_AMBULATORY_CARE_PROVIDER_SITE_OTHER): Payer: Medicare Other | Admitting: Family Medicine

## 2023-05-04 VITALS — BP 126/72 | HR 84 | Temp 97.8°F | Resp 16 | Ht 72.0 in | Wt 241.8 lb

## 2023-05-04 DIAGNOSIS — Z0000 Encounter for general adult medical examination without abnormal findings: Secondary | ICD-10-CM | POA: Diagnosis not present

## 2023-05-04 DIAGNOSIS — E785 Hyperlipidemia, unspecified: Secondary | ICD-10-CM | POA: Diagnosis not present

## 2023-05-04 DIAGNOSIS — E1159 Type 2 diabetes mellitus with other circulatory complications: Secondary | ICD-10-CM | POA: Diagnosis not present

## 2023-05-04 DIAGNOSIS — I152 Hypertension secondary to endocrine disorders: Secondary | ICD-10-CM | POA: Diagnosis not present

## 2023-05-04 DIAGNOSIS — R351 Nocturia: Secondary | ICD-10-CM | POA: Diagnosis not present

## 2023-05-04 DIAGNOSIS — E1169 Type 2 diabetes mellitus with other specified complication: Secondary | ICD-10-CM | POA: Diagnosis not present

## 2023-05-04 NOTE — Progress Notes (Signed)
Name: Lawrence Thornton.   MRN: [id number]    DOB: 04-01-1956   Date:05/04/2023       Progress Note  Subjective  Chief Complaint  Chief Complaint  Patient presents with   Annual Exam    HPI  Patient presents for annual CPE .   IPSS     Row Name 05/04/23 0751         International Prostate Symptom Score   How often have you had the sensation of not emptying your bladder? Not at All     How often have you had to urinate less than every two hours? Not at All     How often have you found you stopped and started again several times when you urinated? Not at All     How often have you found it difficult to postpone urination? Not at All     How often have you had a weak urinary stream? Not at All     How often have you had to strain to start urination? Not at All     How many times did you typically get up at night to urinate? 2 Times     Total IPSS Score 2       Quality of Life due to urinary symptoms   If you were to spend the rest of your life with your urinary condition just the way it is now how would you feel about that? Pleased              Diet: eating a balanced diet  Exercise: he walks a half mile daily with his dog  Last Dental Exam: yes, getting dental implants done now  Last Eye Exam: he is due and will schedule this Summer   Depression: phq 9 is negative    05/04/2023    7:54 AM 03/24/2023    8:02 AM 02/09/2023    8:08 AM 11/03/2022    8:43 AM 10/29/2022    7:44 AM  Depression screen PHQ 2/9  Decreased Interest 0 0 0 0 0  Down, Depressed, Hopeless 0 0 0 0 0  PHQ - 2 Score 0 0 0 0 0  Altered sleeping 0 0   0  Tired, decreased energy 0 0   0  Change in appetite 0 0   0  Feeling bad or failure about yourself  0 0   0  Trouble concentrating 0 0   0  Moving slowly or fidgety/restless 0 0   0  Suicidal thoughts 0 0   0  PHQ-9 Score 0 0   0  Difficult doing work/chores Not difficult at all Not difficult at all       Hypertension:  BP Readings  from Last 3 Encounters:  05/04/23 126/72  03/24/23 124/70  02/09/23 125/66    Obesity: Wt Readings from Last 3 Encounters:  05/04/23 241 lb 12.8 oz (109.7 kg)  03/24/23 240 lb 6.4 oz (109 kg)  02/09/23 230 lb (104.3 kg)   BMI Readings from Last 3 Encounters:  05/04/23 32.79 kg/m  03/24/23 32.60 kg/m  02/09/23 31.19 kg/m     [employer] Visit from 05/04/2023 in [facility]  AUDIT-C Score 0        Married STD testing and prevention (HIV/chl/gon/syphilis):  not applicable Sexual history: not sexually active  Hep C Screening: completed Skin cancer: Discussed monitoring for atypical lesions Colorectal cancer: up to date, repeat in 2031 Prostate cancer:  yes  Lab Results  Component Value Date   PSA 0.24 04/30/2022   PSA 0.14 05/05/2021   PSA 0.17 05/03/2020     Lung cancer:  Low Dose CT Chest recommended if Age 27-80 years, 30 pack-year currently smoking OR have quit w/in 15years. Patient  is not a candidate for screening   AAA: The USPSTF recommends one-time screening with ultrasonography in men ages 58 to 75 years who have ever smoked. Patient   is a candidate for screening but not interested in having the study done ECG:  2017   Vaccines: reviewed with the patient.   Advanced Care Planning: A voluntary discussion about advance care planning including the explanation and discussion of advance directives.  Discussed health care proxy and Living will, and the patient was able to identify a health care proxy as wife.  Patient does not have a living will and power of attorney of health care   Patient Active Problem List   Diagnosis Date Noted   Spinal stenosis, lumbar region, with neurogenic claudication 05/20/2022   Radicular pain of left lower extremity 04/21/2022   Chronic use of opiate for therapeutic purpose 12/15/2018   Lumbar spondylosis 03/15/2018   Long term current use of opiate analgesic 12/15/2017   Dyslipidemia  associated with type 2 diabetes mellitus (HCC) 08/13/2017   Osteoarthritis of acromioclavicular joint 07/30/2017   S/P lumbar fusion 07/13/2017   Chronic pain syndrome 07/13/2017   History of colonic polyps    Constipation due to opioid therapy 07/18/2015   Hypertriglyceridemia 01/22/2015   S/P knee replacement 12/21/2014   Chronic right-sided low back pain without sciatica 09/21/2014   BPH associated with nocturia 09/21/2014   Dyslipidemia 09/21/2014   Continuous opioid dependence (HCC) 09/21/2014   Lumbar degenerative disc disease 09/21/2014   Failure of erection 09/21/2014    Past Surgical History:  Procedure Laterality Date   APPENDECTOMY  age 8   arthroscopic shoulder surgery Right 08/31/2017   BACK SURGERY  lower back surgery x 3    L 3 L 4 and L 5 are fused together   COLONOSCOPY WITH PROPOFOL N/A 05/04/2017   Procedure: COLONOSCOPY WITH PROPOFOL;  Surgeon: Midge Minium, MD;  Location: [facility] ENDOSCOPY;  Service: Endoscopy;  Laterality: N/A;   COLONOSCOPY WITH PROPOFOL N/A 06/30/2022   Procedure: COLONOSCOPY WITH PROPOFOL;  Surgeon: Midge Minium, MD;  Location[facility] ENDOSCOPY;  Service: Endoscopy;  Laterality: N/A;   LUMBAR FUSION  1999   L- to L5   metal removed from eye  years ago   sebaceous cyst removed Right 15 years ago   middle  finger    TOE FUSION Right    4th toe   TOTAL KNEE ARTHROPLASTY Left 08/10/2014   Procedure: LEFT TOTAL KNEE ARTHROPLASTY;  Surgeon: Eugenia Mcalpine, MD;  Location: [facility] ORS;  Service: Orthopedics;  Laterality: Left;   TOTAL KNEE ARTHROPLASTY Right 12/21/2014   Procedure: RIGHT TOTAL KNEE ARTHROPLASTY;  Surgeon: Eugenia Mcalpine, MD;  Location: [facility] ORS;  Service: Orthopedics;  Laterality: Right;   TOTAL KNEE ARTHROPLASTY Right 12/21/14    Family History  Problem Relation Age of Onset   Cancer Mother        Throat   Heart attack Mother    Hypertension Father    Hyperlipidemia Father     Social History   Socioeconomic History   Marital  status: Married    Spouse name: Darl Pikes   Number of children: 0   Years of education: Not on file   Highest education level:  Not on file  Occupational History   Occupation: mill write work   Tobacco Use   Smoking status: Former    Current packs/day: 0.00    Average packs/day: 1.5 packs/day for 20.0 years (30.0 ttl pk-yrs)    Types: Cigarettes    Start date: 04/07/1979    Quit date: 04/07/1999    Years since quitting: 24.0   Smokeless tobacco: Never   Tobacco comments:    not smoking   Vaping Use   Vaping status: Never Used  Substance and Sexual Activity   Alcohol use: No   Drug use: No   Sexual activity: Not Currently    Birth control/protection: Condom  Other Topics Concern   Not on file  Social History Narrative   Not on file   Social Drivers of Health   Financial Resource Strain: Low Risk  (05/04/2023)   Overall Financial Resource Strain (CARDIA)    Difficulty of Paying Living Expenses: Not hard at all  Food Insecurity: No Food Insecurity (05/04/2023)   Hunger Vital Sign    Worried About Running Out of Food in the Last Year: Never true    Ran Out of Food in the Last Year: Never true  Transportation Needs: No Transportation Needs (05/04/2023)   PRAPARE - Administrator, Civil Service (Medical): No    Lack of Transportation (Non-Medical): No  Physical Activity: Inactive (05/04/2023)   Exercise Vital Sign    Days of Exercise per Week: 0 days    Minutes of Exercise per Session: 0 min  Stress: No Stress Concern Present (05/04/2023)   [employer] of Occupational Health - Occupational Stress Questionnaire    Feeling of Stress : Only a little  Social Connections: Moderately Isolated (05/04/2023)   Social Connection and Isolation Panel [NHANES]    Frequency of Communication with Friends and Family: Never    Frequency of Social Gatherings with Friends and Family: Never    Attends Religious Services: Never    Database administrator or Organizations: Yes    Attends  Banker Meetings: Never    Marital Status: Married  Catering manager Violence: Not At Risk (05/04/2023)   Humiliation, Afraid, Rape, and Kick questionnaire    Fear of Current or Ex-Partner: No    Emotionally Abused: No    Physically Abused: No    Sexually Abused: No     Current Outpatient Medications:    amLODIPine-Valsartan-HCTZ 10-320-25 MG TABS, One tablet by mouth once daily, Disp: 90 tablet, Rfl: 0   aspirin EC 81 MG tablet, Take 1 tablet (81 mg total) by mouth daily., Disp: 30 tablet, Rfl: 0   atorvastatin (LIPITOR) 20 MG tablet, Take 1 tablet (20 mg total) by mouth daily., Disp: 90 tablet, Rfl: 1   linaclotide (LINZESS) 72 MCG capsule, Take 1 capsule (72 mcg total) by mouth daily before breakfast., Disp: 90 capsule, Rfl: 1   naloxone (NARCAN) 2 MG/2ML injection, Inject 1 mL (1 mg total) into the muscle as needed for up to 2 doses (for opioid overdose). Inject content of syringe into thigh muscle. Call 911., Disp: 2 mL, Rfl: 1   omega-3 acid ethyl esters (LOVAZA) 1 g capsule, Take 2 capsules (2 g total) by mouth 2 (two) times daily., Disp: 360 capsule, Rfl: 1   oxyCODONE (OXYCONTIN) 20 mg 12 hr tablet, Take 1 tablet (20 mg total) by mouth every 12 (twelve) hours. Months last 30 days., Disp: 60 tablet, Rfl: 0   oxyCODONE-acetaminophen (PERCOCET) 10-325 MG tablet,  Take 1 tablet by mouth 2 (two) times daily as needed for pain., Disp: 60 tablet, Rfl: 0   Semaglutide, 2 MG/DOSE, 8 MG/3ML SOPN, Inject 2 mg as directed once a week., Disp: 9 mL, Rfl: 1   sildenafil (VIAGRA) 100 MG tablet, Take 1 tablet (100 mg total) by mouth daily as needed for erectile dysfunction., Disp: 30 tablet, Rfl: 0   tamsulosin (FLOMAX) 0.4 MG CAPS capsule, Take 1 capsule (0.4 mg total) by mouth daily., Disp: 90 capsule, Rfl: 1   tiZANidine (ZANAFLEX) 4 MG tablet, Take 1 tablet (4 mg total) by mouth 2 (two) times daily as needed for muscle spasms., Disp: 60 tablet, Rfl: 5   oxyCODONE-acetaminophen  (PERCOCET) 10-325 MG tablet, Take 1 tablet by mouth 2 (two) times daily as needed for pain., Disp: 60 tablet, Rfl: 0   oxyCODONE-acetaminophen (PERCOCET) 10-325 MG tablet, Take 1 tablet by mouth 2 (two) times daily as needed for pain., Disp: 60 tablet, Rfl: 0  No Known Allergies   ROS  Constitutional: Negative for fever or weight change.  Respiratory: Negative for cough and shortness of breath.   Cardiovascular: Negative for chest pain or palpitations.  Gastrointestinal: Negative for abdominal pain, no bowel changes.  Musculoskeletal: Negative for gait problem or joint swelling.  Skin: Negative for rash.  Neurological: Negative for dizziness or headache.  No other specific complaints in a complete review of systems (except as listed in HPI above).    Objective  Vitals:   05/04/23 0755  BP: 126/72  Pulse: 84  Resp: 16  Temp: 97.8 F (36.6 C)  TempSrc: Oral  SpO2: 99%  Weight: 241 lb 12.8 oz (109.7 kg)  Height: 6' (1.829 m)    Body mass index is 32.79 kg/m.  Physical Exam  Constitutional: Patient appears well-developed and well-nourished. No distress.  HENT: Head: Normocephalic and atraumatic. Ears: B TMs ok, no erythema or effusion; Nose: Nose normal. Mouth/Throat: Oropharynx is clear and moist. No oropharyngeal exudate.  Eyes: Conjunctivae and EOM are normal. Pupils are equal, round, and reactive to light. No scleral icterus.  Neck: Normal range of motion. Neck supple. No JVD present. No thyromegaly present.  Cardiovascular: Normal rate, regular rhythm and normal heart sounds.  No murmur heard. No BLE edema. Pulmonary/Chest: Effort normal and breath sounds normal. No respiratory distress. Abdominal: Soft. Bowel sounds are normal, no distension. There is no tenderness. no masses MALE GENITALIA: Normal descended testes bilaterally, no masses palpated, no hernias, no lesions, no discharge RECTAL: not done  Musculoskeletal: Normal range of motion, no joint effusions. No  gross deformities Neurological: he is alert and oriented to person, place, and time. No cranial nerve deficit. Coordination, balance, strength, speech and gait are normal.  Skin: Skin is warm and dry. No rash noted. No erythema.  Psychiatric: Patient has a normal mood and affect. behavior is normal. Judgment and thought content normal.     Assessment & Plan  1. Well adult exam (Primary)    -Prostate cancer screening and PSA options (with potential risks and benefits of testing vs not testing) were discussed along with recent recs/guidelines. -USPSTF grade A and B recommendations reviewed with patient; age-appropriate recommendations, preventive care, screening tests, etc discussed and encouraged; healthy living encouraged; see AVS for patient education given to patient -Discussed importance of 150 minutes of physical activity weekly, eat two servings of fish weekly, eat one serving of tree nuts ( cashews, pistachios, pecans, almonds.Marland Kitchen) every other day, eat 6 servings of fruit/vegetables daily and drink plenty  of water and avoid sweet beverages.  -Reviewed Health Maintenance: yes

## 2023-05-05 LAB — LIPID PANEL
Cholesterol: 104 mg/dL (ref <200)
HDL: 48 mg/dL (ref >=40)
LDL Cholesterol (Calc): 44 mg/dL
Non-HDL Cholesterol (Calc): 56 mg/dL (ref <130)
Total CHOL/HDL Ratio: 2.2 (calc) (ref <5.0)
Triglycerides: 50 mg/dL (ref <150)

## 2023-05-05 LAB — COMPLETE METABOLIC PANEL WITH GFR
AG Ratio: 1.9 (calc) (ref 1.0–2.5)
ALT: 18 U/L (ref 9–46)
AST: 15 U/L (ref 10–35)
Albumin: 4.9 g/dL (ref 3.6–5.1)
Alkaline phosphatase (APISO): 80 U/L (ref 35–144)
BUN: 12 mg/dL (ref 7–25)
CO2: 30 mmol/L (ref 20–32)
Calcium: 10 mg/dL (ref 8.6–10.3)
Chloride: 101 mmol/L (ref 98–110)
Creat: 0.81 mg/dL (ref 0.70–1.35)
Globulin: 2.6 g/dL (ref 1.9–3.7)
Glucose, Bld: 110 mg/dL — ABNORMAL HIGH (ref 65–99)
Potassium: 4.5 mmol/L (ref 3.5–5.3)
Sodium: 139 mmol/L (ref 135–146)
Total Bilirubin: 0.6 mg/dL (ref 0.2–1.2)
Total Protein: 7.5 g/dL (ref 6.1–8.1)
eGFR: 97 mL/min/{1.73_m2} (ref >=60)

## 2023-05-05 LAB — CBC WITH DIFFERENTIAL/PLATELET
Absolute Lymphocytes: 1733 {cells}/uL (ref 850–3900)
Absolute Monocytes: 408 {cells}/uL (ref 200–950)
Basophils Absolute: 42 {cells}/uL (ref 0–200)
Basophils Relative: 0.8 %
Eosinophils Absolute: 159 {cells}/uL (ref 15–500)
Eosinophils Relative: 3 %
HCT: 45.5 % (ref 38.5–50.0)
Hemoglobin: 15.8 g/dL (ref 13.2–17.1)
MCH: 31.6 pg (ref 27.0–33.0)
MCHC: 34.7 g/dL (ref 32.0–36.0)
MCV: 91 fL (ref 80.0–100.0)
MPV: 9.6 fL (ref 7.5–12.5)
Monocytes Relative: 7.7 %
Neutro Abs: 2957 {cells}/uL (ref 1500–7800)
Neutrophils Relative %: 55.8 %
Platelets: 266 10*3/uL (ref 140–400)
RBC: 5 10*6/uL (ref 4.20–5.80)
RDW: 12 % (ref 11.0–15.0)
Total Lymphocyte: 32.7 %
WBC: 5.3 10*3/uL (ref 3.8–10.8)

## 2023-05-05 LAB — MICROALBUMIN / CREATININE URINE RATIO
Creatinine, Urine: 278 mg/dL (ref 20–320)
Microalb Creat Ratio: 4 mg/g{creat} (ref <30)
Microalb, Ur: 1.1 mg/dL

## 2023-05-05 LAB — PSA: PSA: 0.19 ng/mL (ref <=4.00)

## 2023-05-06 ENCOUNTER — Ambulatory Visit
Payer: Medicare Other | Attending: Student in an Organized Health Care Education/Training Program | Admitting: Student in an Organized Health Care Education/Training Program

## 2023-05-06 ENCOUNTER — Encounter: Payer: Self-pay | Admitting: Student in an Organized Health Care Education/Training Program

## 2023-05-06 VITALS — BP 125/74 | HR 78 | Temp 98.4°F | Resp 16 | Ht 72.0 in | Wt 241.0 lb

## 2023-05-06 DIAGNOSIS — G894 Chronic pain syndrome: Secondary | ICD-10-CM | POA: Diagnosis not present

## 2023-05-06 DIAGNOSIS — Z981 Arthrodesis status: Secondary | ICD-10-CM | POA: Diagnosis not present

## 2023-05-06 DIAGNOSIS — M5410 Radiculopathy, site unspecified: Secondary | ICD-10-CM | POA: Diagnosis not present

## 2023-05-06 DIAGNOSIS — Z79891 Long term (current) use of opiate analgesic: Secondary | ICD-10-CM | POA: Diagnosis not present

## 2023-05-06 DIAGNOSIS — M48062 Spinal stenosis, lumbar region with neurogenic claudication: Secondary | ICD-10-CM | POA: Diagnosis not present

## 2023-05-06 DIAGNOSIS — M5136 Other intervertebral disc degeneration, lumbar region with discogenic back pain only: Secondary | ICD-10-CM | POA: Diagnosis not present

## 2023-05-06 DIAGNOSIS — M545 Low back pain, unspecified: Secondary | ICD-10-CM | POA: Diagnosis not present

## 2023-05-06 DIAGNOSIS — G8929 Other chronic pain: Secondary | ICD-10-CM | POA: Diagnosis not present

## 2023-05-06 MED ORDER — OXYCODONE HCL ER 20 MG PO T12A: 20.0000 mg | EXTENDED_RELEASE_TABLET | Freq: Two times a day (BID) | ORAL | 0 refills | Status: DC

## 2023-05-06 MED ORDER — OXYCODONE HCL ER 20 MG PO T12A: 20.0000 mg | EXTENDED_RELEASE_TABLET | Freq: Two times a day (BID) | ORAL | 0 refills | Status: AC

## 2023-05-06 MED ORDER — OXYCODONE-ACETAMINOPHEN 10-325 MG PO TABS: 1.0000 | ORAL_TABLET | Freq: Two times a day (BID) | ORAL | 0 refills | Status: DC | PRN

## 2023-05-06 NOTE — Progress Notes (Signed)
Nursing Pain Medication Assessment:  Safety precautions to be maintained throughout the outpatient stay will include: orient to surroundings, keep bed in low position, maintain call bell within reach at all times, provide assistance with transfer out of bed and ambulation.  Medication Inspection Compliance: Pill count conducted under aseptic conditions, in front of the patient. Neither the pills nor the bottle was removed from the patient's sight at any time. Once count was completed pills were immediately returned to the patient in their original bottle.  Medication #1:  oxycontin Pill/Patch Count:  13 of 60 pills remain Pill/Patch Appearance: Markings consistent with prescribed medication Bottle Appearance: Standard pharmacy container. Clearly labeled. Filled Date: 01 / 05 / 2025 Last Medication intake:  Today  Medication #2: Oxycodone/APAP Pill/Patch Count:  13 of 60 pills remain Pill/Patch Appearance: Markings consistent with prescribed medication Bottle Appearance: Standard pharmacy container. Clearly labeled. Filled Date: 01 / 05 / 2025 Last Medication intake:  Today

## 2023-05-06 NOTE — Progress Notes (Signed)
PROVIDER NOTE: Information contained herein reflects review and annotations entered in association with encounter. Interpretation of such information and data should be left to medically-trained personnel. Information provided to patient can be located elsewhere in the medical record under "Patient Instructions". Document created using STT-dictation technology, any transcriptional errors that may result from process are unintentional.    Patient: Lawrence Thornton.  Service Category: E/M  Provider: Edward Jolly, MD  DOB: [birth year]  DOS: 05/06/2023  Referring Provider: Alba Cory, MD  MRN: [id number]  Specialty: Interventional Pain Management  PCP: Alba Cory, MD  Type: Established Patient  Setting: Ambulatory outpatient    Location: Office  Delivery: Face-to-face     HPI  Mr. Jock Mahon., a 68 y.o. year old male, is here today because of his Chronic pain syndrome [G89.4]. Mr. Wisehart primary complain today is Back Pain Last encounter: My last encounter with him was on 11/03/22 Pertinent problems: Mr. Dahm has Chronic right-sided low back pain without sciatica; Continuous opioid dependence (HCC); S/P knee replacement; S/P lumbar fusion; Osteoarthritis of acromioclavicular joint; Long term current use of opiate analgesic; Lumbar spondylosis; and Chronic use of opiate for therapeutic purpose on their pertinent problem list. Pain Assessment: Severity of Chronic pain is reported as a 4 /10. Location: Back Right, Lower/denies; however right big toe :is a little bit numb". Onset: More than a month ago. Quality: Archie Patten. Timing: Constant. Modifying factor(s): meds, walking. Vitals:  height is 6' (1.829 m) and weight is 241 lb (109.3 kg). His temperature is 98.4 F (36.9 C). His blood pressure is 125/74 and his pulse is 78. His respiration is 16 and oxygen saturation is 100%.  BMI: Estimated body mass index is 32.69 kg/m as calculated from the following:   Height as of  this encounter: 6' (1.829 m).   Weight as of this encounter: 241 lb (109.3 kg).  Reason for encounter: medication management  No change in medical history since last visit.  Patient's pain is at baseline.  Patient continues multimodal pain regimen as prescribed.  States that it provides pain relief and improvement in functional status. No recent radicular pain flares Was weaned from a total MME >130 to 90 now. States that wife fractured her right hip so has been taking care of her    Pharmacotherapy Assessment  Analgesic: Oxycontin ER 20 mg BID, Oxycodone 10 mg BID PRN breakthrough pain  Monitoring: [place] PMP: PDMP reviewed during this encounter.       Pharmacotherapy: No side-effects or adverse reactions reported. Compliance: No problems identified. Effectiveness: Clinically acceptable.  Nonah Mattes, RN  05/06/2023  8:08 AM  Sign when Signing Visit Nursing Pain Medication Assessment:  Safety precautions to be maintained throughout the outpatient stay will include: orient to surroundings, keep bed in low position, maintain call bell within reach at all times, provide assistance with transfer out of bed and ambulation.  Medication Inspection Compliance: Pill count conducted under aseptic conditions, in front of the patient. Neither the pills nor the bottle was removed from the patient's sight at any time. Once count was completed pills were immediately returned to the patient in their original bottle.  Medication #1:  oxycontin Pill/Patch Count:  13 of 60 pills remain Pill/Patch Appearance: Markings consistent with prescribed medication Bottle Appearance: Standard pharmacy container. Clearly labeled. Filled Date: 01 / 05 / 2025 Last Medication intake:  Today  Medication #2: Oxycodone/APAP Pill/Patch Count:  13 of 60 pills remain Pill/Patch Appearance: Markings consistent with prescribed medication Bottle  Appearance: Standard pharmacy container. Clearly labeled. Filled Date: 01 /  05 / 2025 Last Medication intake:  Today    No results found for: "CBDTHCR" No results found for: "D8THCCBX" No results found for: "D9THCCBX"  UDS:  Summary  Date Value Ref Range Status  07/23/2022 Note  Final    Comment:    ==================================================================== [facility] (MW) ==================================================================== Test                             Result       Flag       Units  Drug Present and Declared for Prescription Verification   Oxycodone                      3671         EXPECTED   ng/mg creat   Oxymorphone                    2119         EXPECTED   ng/mg creat   Noroxycodone                   >3968        EXPECTED   ng/mg creat   Noroxymorphone                 1881         EXPECTED   ng/mg creat    Sources of oxycodone are scheduled prescription medications.    Oxymorphone, noroxycodone, and noroxymorphone are expected    metabolites of oxycodone. Oxymorphone is also available as a    scheduled prescription medication.  ==================================================================== Test                      Result    Flag   Units      Ref Range   Creatinine              252              mg/dL      >=21 ==================================================================== Declared Medications:  The flagging and interpretation on this report are based on the  following declared medications.  Unexpected results may arise from  inaccuracies in the declared medications.   **Note: The testing scope of this panel includes these medications:   Oxycodone (Oxycontin)  Oxycodone (Percocet)   **Note: The testing scope of this panel does not include the  following reported medications:   Acetaminophen (Percocet)  Amlodipine  Aspirin  Atorvastatin (Lipitor)  Hydrochlorothiazide  Linaclotide (Linzess)  Naloxone (Narcan)  Omega-3 Fatty Acids  Polyethylene Glycol  Semaglutide  Sildenafil (Viagra)   Tamsulosin (Flomax)  Tizanidine (Zanaflex)  Valsartan ==================================================================== For clinical consultation, please call [phone]. ====================================================================       ROS  Constitutional: Denies any fever or chills Gastrointestinal: No reported hemesis, hematochezia, vomiting, or acute GI distress Musculoskeletal:  Low back, left leg pain Neurological: No reported episodes of acute onset apraxia, aphasia, dysarthria, agnosia, amnesia, paralysis, loss of coordination, or loss of consciousness  Medication Review  Semaglutide (2 MG/DOSE), amLODIPine-Valsartan-HCTZ, aspirin EC, atorvastatin, linaclotide, naloxone, omega-3 acid ethyl esters, oxyCODONE, oxyCODONE-acetaminophen, sildenafil, tamsulosin, and tiZANidine  History Review  Allergy: Mr. Shin has no known allergies. Drug: Mr. Sjogren  reports no history of drug use. Alcohol:  reports no history of alcohol use. Tobacco:  reports that he quit smoking about 24 years  ago. His smoking use included cigarettes. He started smoking about 44 years ago. He has a 29.9 pack-year smoking history. He has never used smokeless tobacco. Social: Mr. Caggiano  reports that he quit smoking about 24 years ago. His smoking use included cigarettes. He started smoking about 44 years ago. He has a 29.9 pack-year smoking history. He has never used smokeless tobacco. He reports that he does not drink alcohol and does not use drugs. Medical:  has a past medical history of Arthritis and Hypertension. Surgical: Mr. Passow  has a past surgical history that includes Back surgery (lower back surgery x 3 ); Appendectomy (age 39); sebaceous cyst removed (Right, 15 years ago); Toe Fusion (Right); metal removed from eye (years ago); Total knee arthroplasty (Left, 08/10/2014); Total knee arthroplasty (Right, 12/21/2014); Total knee arthroplasty (Right, 12/21/14); Lumbar fusion (1999);  Colonoscopy with propofol (N/A, 05/04/2017); arthroscopic shoulder surgery (Right, 08/31/2017); and Colonoscopy with propofol (N/A, 06/30/2022). Family: family history includes Cancer in his mother; Heart attack in his mother; Hyperlipidemia in his father; Hypertension in his father.  Laboratory Chemistry Profile   Renal Lab Results  Component Value Date   BUN 12 05/04/2023   CREATININE 0.81 05/04/2023   LABCREA 278 05/04/2023   BCR SEE NOTE: 05/04/2023   GFRAA 108 05/03/2020   GFRNONAA 93 05/03/2020    Hepatic Lab Results  Component Value Date   AST 15 05/04/2023   ALT 18 05/04/2023   ALBUMIN 4.6 11/11/2015   ALKPHOS 76 11/11/2015   HCVAB NEGATIVE 10/06/2016    Electrolytes Lab Results  Component Value Date   NA 139 05/04/2023   K 4.5 05/04/2023   CL 101 05/04/2023   CALCIUM 10.0 05/04/2023    Bone Lab Results  Component Value Date   VD25OH 43 10/06/2016    Inflammation (CRP: Acute Phase) (ESR: Chronic Phase) No results found for: "CRP", "ESRSEDRATE", "LATICACIDVEN"       Note: Above Lab results reviewed.  Recent Imaging Review  MR LUMBAR SPINE W WO CONTRAST CLINICAL DATA:  68 year old male with persistent low back pain. Left leg radicular pain. Prior surgery.  EXAM: MRI LUMBAR SPINE WITHOUT AND WITH CONTRAST  TECHNIQUE: Multiplanar and multiecho pulse sequences of the lumbar spine were obtained without and with intravenous contrast.  CONTRAST:  10mL GADAVIST GADOBUTROL 1 MMOL/ML IV SOLN  COMPARISON:  Lumbar radiographs 03/16/2013.  FINDINGS: Segmentation:  Normal on the comparison.  Alignment: Straightening of lumbar lordosis not significantly changed since 2014. No significant scoliosis or spondylolisthesis.  Vertebrae: Abundant susceptibility artifact associated with chronic posterior spinal fusion hardware L3 through the sacrum.  Evidence of solid arthrodesis at L4-L5 and L5-S1, and possibly at the posterior elements of L3-L4. Advanced mostly  chronic appearing degenerative endplate marrow changes at L1-L2, see details below. Background bone marrow signal within normal limits. No definite No acute osseous abnormality identified.  Conus medullaris and cauda equina: Conus extends to the L1, extending toward the L1-L2 disc level. Degenerative conus mass effect (series 5, image 10), but no lower spinal cord or conus signal abnormality.  No abnormal intradural enhancement or dural thickening is evident.  Paraspinal and other soft tissues: Postoperative changes to the posterior lumbar paraspinal soft tissues. Negative visible abdominal viscera.  Disc levels:  T12-L1: Some disc space loss with bulky circumferential disc osteophyte complex. Broad-based posterior component and mild to moderate facet and ligament flavum hypertrophy greater on the left. Mild spinal stenosis, with up to mild mass effect on the lower thoracic spinal cord here (series  5, image 10). Mild left lateral recess stenosis (left L1 nerve level). And moderate to severe multifactorial left T12 foraminal stenosis (series 5, image 12).  L1-L2: Severe disc space loss, bulky circumferential disc osteophyte complex. Broad-based posterior component. Relatively mild facet and ligament flavum hypertrophy. Some epidural lipomatosis. Severe spinal stenosis (series 8, image 9) with some mass effect on the conus. Moderate to severe bilateral L1 foraminal stenosis.  L2-L3: Better preserved disc space, but circumferential disc osteophyte complex with a bulky and broad-based posterior component eccentric to the left. Partially visible at least moderate facet and ligament flavum hypertrophy with some epidural lipomatosis.  Severe spinal stenosis, with lateral recess stenosis greater on the left (series 8, image 18, descending left L3 nerve level). Mild to moderate L2 foraminal stenosis greater on the left.  L3-L4:  Prior decompression and fusion.  No adverse  features.  L4-L5: Prior decompression and fusion. Evidence of solid arthrodesis, no adverse features.  L5-S1: Prior decompression and fusion. Evidence of solid arthrodesis. No adverse features.  IMPRESSION: 1. Prior decompression and fusion from L3-L4 through L5-S1 with no adverse features.  2. But bulky disc and endplate degeneration elsewhere T12-L1 through the adjacent L2-L3 segment. Subsequent moderate to severe multifactorial spinal stenosis at L1-L2 (level of the conus), and L2-L3 (left lateral recess more affected). Mild spinal stenosis at T12-L1. Associated mass effect on the lower thoracic spinal cord and conus, but no cord/conus signal abnormality.  3. Associated moderate to severe left T12 and bilateral L1 neural foraminal stenosis. Up to moderate left L2 foraminal stenosis.  Electronically Signed   By: Odessa Fleming M.D.   On: 05/02/2022 08:00 Note: Reviewed        Physical Exam  General appearance: Well nourished, well developed, and well hydrated. In no apparent acute distress Mental status: Alert, oriented x 3 (person, place, & time)       Respiratory: No evidence of acute respiratory distress Eyes: PERLA Vitals: BP 125/74 (BP Location: Left Arm, Patient Position: Sitting, Cuff Size: Large)   Pulse 78   Temp 98.4 F (36.9 C)   Resp 16   Ht 6' (1.829 m)   Wt 241 lb (109.3 kg)   SpO2 100%   BMI 32.69 kg/m  BMI: Estimated body mass index is 32.69 kg/m as calculated from the following:   Height as of this encounter: 6' (1.829 m).   Weight as of this encounter: 241 lb (109.3 kg). Ideal: Ideal body weight: 77.6 kg (171 lb 1.2 oz) Adjusted ideal body weight: 90.3 kg (199 lb 0.7 oz)  Lumbar Spine Area Exam  Skin & Axial Inspection: Well healed scar from previous spine surgery detected Alignment: Symmetrical Functional ROM: Pain restricted ROM affecting both sides Stability: No instability detected Muscle Tone/Strength: Functionally intact. No obvious  neuro-muscular anomalies detected. Sensory (Neurological): Dermatomal with radiation into left buttock and left lateral thigh   Gait & Posture Assessment  Ambulation: Unassisted Gait: Relatively normal for age and body habitus Posture: WNL  Lower Extremity Exam      Side: Right lower extremity   Side: Left lower extremity  Stability: No instability observed           Stability: No instability observed          Skin & Extremity Inspection: Skin color, temperature, and hair growth are WNL. No peripheral edema or cyanosis. No masses, redness, swelling, asymmetry, or associated skin lesions. No contractures.   Skin & Extremity Inspection: Skin color, temperature, and hair growth are WNL.  No peripheral edema or cyanosis. No masses, redness, swelling, asymmetry, or associated skin lesions. No contractures.  Functional ROM: Unrestricted ROM                   Functional ROM: Unrestricted ROM                  Muscle Tone/Strength: Functionally intact. No obvious neuro-muscular anomalies detected.   Muscle Tone/Strength: Functionally intact. No obvious neuro-muscular anomalies detected.  Sensory (Neurological): Unimpaired         Sensory (Neurological): Dermatomal       DTR: Patellar: deferred today Achilles: deferred today Plantar: deferred today   DTR: Patellar: deferred today Achilles: deferred today Plantar: deferred today  Palpation: No palpable anomalies   Palpation: No palpable anomalies    Assessment   Diagnosis Status  1. Chronic pain syndrome   2. Chronic use of opiate for therapeutic purpose   3. Spinal stenosis, lumbar region, with neurogenic claudication   4. S/P lumbar fusion   5. Radicular pain of left lower extremity   6. Chronic right-sided low back pain without sciatica   7. Degeneration of intervertebral disc of lumbar region with discogenic back pain      Controlled Controlled Controlled    Plan of Care   1. Chronic pain syndrome (Primary) -  oxyCODONE-acetaminophen (PERCOCET) 10-325 MG tablet; Take 1 tablet by mouth 2 (two) times daily as needed for pain.  Dispense: 60 tablet; Refill: 0 - oxyCODONE-acetaminophen (PERCOCET) 10-325 MG tablet; Take 1 tablet by mouth 2 (two) times daily as needed for pain.  Dispense: 60 tablet; Refill: 0 - oxyCODONE-acetaminophen (PERCOCET) 10-325 MG tablet; Take 1 tablet by mouth 2 (two) times daily as needed for pain.  Dispense: 60 tablet; Refill: 0 - oxyCODONE (OXYCONTIN) 20 mg 12 hr tablet; Take 1 tablet (20 mg total) by mouth every 12 (twelve) hours. Months last 30 days.  Dispense: 60 tablet; Refill: 0 - oxyCODONE (OXYCONTIN) 20 mg 12 hr tablet; Take 1 tablet (20 mg total) by mouth every 12 (twelve) hours. Months last 30 days.  Dispense: 60 tablet; Refill: 0 - oxyCODONE (OXYCONTIN) 20 mg 12 hr tablet; Take 1 tablet (20 mg total) by mouth every 12 (twelve) hours. Months last 30 days.  Dispense: 60 tablet; Refill: 0  2. Chronic use of opiate for therapeutic purpose - oxyCODONE-acetaminophen (PERCOCET) 10-325 MG tablet; Take 1 tablet by mouth 2 (two) times daily as needed for pain.  Dispense: 60 tablet; Refill: 0 - oxyCODONE-acetaminophen (PERCOCET) 10-325 MG tablet; Take 1 tablet by mouth 2 (two) times daily as needed for pain.  Dispense: 60 tablet; Refill: 0 - oxyCODONE-acetaminophen (PERCOCET) 10-325 MG tablet; Take 1 tablet by mouth 2 (two) times daily as needed for pain.  Dispense: 60 tablet; Refill: 0 - oxyCODONE (OXYCONTIN) 20 mg 12 hr tablet; Take 1 tablet (20 mg total) by mouth every 12 (twelve) hours. Months last 30 days.  Dispense: 60 tablet; Refill: 0 - oxyCODONE (OXYCONTIN) 20 mg 12 hr tablet; Take 1 tablet (20 mg total) by mouth every 12 (twelve) hours. Months last 30 days.  Dispense: 60 tablet; Refill: 0 - oxyCODONE (OXYCONTIN) 20 mg 12 hr tablet; Take 1 tablet (20 mg total) by mouth every 12 (twelve) hours. Months last 30 days.  Dispense: 60 tablet; Refill: 0  3. Spinal stenosis, lumbar  region, with neurogenic claudication  4. S/P lumbar fusion  5. Radicular pain of left lower extremity  6. Chronic right-sided low back pain without sciatica  7. 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    Recent Visits Date Type Provider Dept  02/09/23 Office Visit Edward Jolly, MD [facility]  Showing recent visits within past 90 days and meeting all other requirements Today's Visits Date Type Provider Dept  05/06/23 Office Visit Edward Jolly, MD [facility]  Showing today's visits and meeting all other requirements Future Appointments No visits were found meeting these conditions. Showing future appointments within next 90 days and meeting all other requirements  I discussed the assessment and treatment plan with the patient. The patient was provided an opportunity to ask questions and all were answered. The patient agreed with the plan and demonstrated an understanding of the instructions.  Patient advised to call back or seek an in-person evaluation if the symptoms or condition worsens.  Duration of encounter: .  Total time on encounter, as per AMA guidelines included both the face-to-face and non-face-to-face time personally spent by the physician and/or other qualified health care professional(s) on the day of the encounter (includes time in activities that require the physician or other qualified health care professional and does not include time in activities normally performed by clinical staff). Physician's time may include the following activities when performed: Preparing to see the patient (e.g.,  pre-charting review of records, searching for previously ordered imaging, lab work, and nerve conduction tests) Review of prior analgesic pharmacotherapies. Reviewing PMP Interpreting ordered tests (e.g., lab work, imaging, nerve conduction tests) Performing post-procedure evaluations, including interpretation of diagnostic procedures Obtaining and/or reviewing separately obtained history Performing a medically appropriate examination and/or evaluation Counseling and educating the patient/family/caregiver Ordering medications, tests, or procedures Referring and communicating with other health care professionals (when not separately reported) Documenting clinical information in the electronic or other health record Independently interpreting results (not separately reported) and communicating results to the patient/ family/caregiver Care coordination (not separately reported)  Note by: Edward Jolly, MD Date: 05/06/2023; Time: 8:19 AM

## 2023-05-06 NOTE — Patient Instructions (Signed)

## 2023-06-14 ENCOUNTER — Telehealth: Payer: Self-pay

## 2023-06-14 NOTE — Telephone Encounter (Signed)
 Spoke with pharmacy and they states his medications are ready for pick up. Patient notified.

## 2023-06-14 NOTE — Telephone Encounter (Signed)
 He called and left a vm on 3/6 stating the pharmacy does not have his oxycodone script. He wants someone to call him to figure out what's going on.

## 2023-06-17 DIAGNOSIS — Z7984 Long term (current) use of oral hypoglycemic drugs: Secondary | ICD-10-CM | POA: Diagnosis not present

## 2023-06-17 DIAGNOSIS — E119 Type 2 diabetes mellitus without complications: Secondary | ICD-10-CM | POA: Diagnosis not present

## 2023-06-17 DIAGNOSIS — H2513 Age-related nuclear cataract, bilateral: Secondary | ICD-10-CM | POA: Diagnosis not present

## 2023-06-17 DIAGNOSIS — D3132 Benign neoplasm of left choroid: Secondary | ICD-10-CM | POA: Diagnosis not present

## 2023-06-17 LAB — HM DIABETES EYE EXAM

## 2023-07-05 DIAGNOSIS — K080 Exfoliation of teeth due to systemic causes: Secondary | ICD-10-CM | POA: Diagnosis not present

## 2023-07-26 NOTE — Progress Notes (Unsigned)
 PROVIDER NOTE: Interpretation of information contained herein should be left to medically-trained personnel. Specific patient instructions are provided elsewhere under "Patient Instructions" section of medical record. This document was created in part using AI and STT-dictation technology, any transcriptional errors that may result from this process are unintentional.  Patient: Lawrence Thornton.  Service: E/M   PCP: Sowles, Krichna, MD  DOB: 1955-07-22  DOS: 07/27/2023  Provider: Cherylin Corrigan, NP  MRN: [id number]  Delivery: Face-to-face  Specialty: Interventional Pain Management  Type: Established Patient  Setting: Ambulatory outpatient facility  Specialty designation: 09  Referring Prov.: Arleen Lacer, MD  Location: Outpatient office facility       HPI  [name], a 68 y.o. year old male, is here today because of his Chronic pain syndrome [G89.4]. [name] primary complain today is No chief complaint on file.   Pain Assessment: Severity of   is reported as a  /10. Location:    / . Onset:  . Quality:  . Timing:  . Modifying factor(s):  Lawrence Thornton Vitals:  vitals were not taken for this visit.  BMI: Estimated body mass index is 32.69 kg/m as calculated from the following:   Height as of 05/06/23: 6' (1.829 m).   Weight as of 05/06/23: 241 lb (109.3 kg). Last encounter: 06/14/2023.  Reason for encounter: medication management.  The patient indicates doing well with current medication regimen.  No adverse reaction or side effects reported to the medication.  Prescribing drug monitoring (PDMP) with prescribed medication regimen.  Pharmacotherapy Assessment  Analgesic: Oxycodone -acetaminophen  (Percocet) 10-325 mg 2 times daily as needed for pain. MME=30 OxyContin  ER 20 Mg every 12 hours as needed for chronic pain. MME=60  Monitoring: [place] PMP: PDMP reviewed during this encounter.       Pharmacotherapy: No side-effects or adverse reactions reported. Compliance: No problems  identified. Effectiveness: Clinically acceptable.  No notes on file  No results found for: "CBDTHCR" No results found for: "D8THCCBX" No results found for: "D9THCCBX"  UDS:  Summary  Date Value Ref Range Status  07/23/2022 Note  Final    Comment:    ==================================================================== [facility] (MW) ==================================================================== Test                             Result       Flag       Units  Drug Present and Declared for Prescription Verification   Oxycodone                       3671         EXPECTED   ng/mg creat   Oxymorphone                    2119         EXPECTED   ng/mg creat   Noroxycodone                   >3968        EXPECTED   ng/mg creat   Noroxymorphone                 1881         EXPECTED   ng/mg creat    Sources of oxycodone  are scheduled prescription medications.    Oxymorphone, noroxycodone, and noroxymorphone are expected    metabolites of oxycodone . Oxymorphone is also available as a  scheduled prescription medication.  ==================================================================== Test                      Result    Flag   Units      Ref Range   Creatinine              252              mg/dL      >=45 ==================================================================== Declared Medications:  The flagging and interpretation on this report are based on the  following declared medications.  Unexpected results may arise from  inaccuracies in the declared medications.   **Note: The testing scope of this panel includes these medications:   Oxycodone  (Oxycontin )  Oxycodone  (Percocet)   **Note: The testing scope of this panel does not include the  following reported medications:   Acetaminophen  (Percocet)  Amlodipine   Aspirin   Atorvastatin  (Lipitor)  Hydrochlorothiazide   Linaclotide  (Linzess )  Naloxone  (Narcan )  Omega-3 Fatty Acids  Polyethylene Glycol   Semaglutide   Sildenafil  (Viagra )  Tamsulosin  (Flomax )  Tizanidine  (Zanaflex )  Valsartan  ==================================================================== For clinical consultation, please call [phone]. ====================================================================       ROS  Constitutional: Denies any fever or chills Gastrointestinal: No reported hemesis, hematochezia, vomiting, or acute GI distress Musculoskeletal: Denies any acute onset joint swelling, redness, loss of ROM, or weakness Neurological: No reported episodes of acute onset apraxia, aphasia, dysarthria, agnosia, amnesia, paralysis, loss of coordination, or loss of consciousness  Medication Review  Semaglutide  (2 MG/DOSE), amLODIPine -Valsartan -HCTZ, aspirin  EC, atorvastatin , linaclotide , naloxone , omega-3 acid ethyl esters, oxyCODONE , oxyCODONE -acetaminophen , sildenafil , tamsulosin , and tiZANidine   History Review  Allergy: Lawrence Thornton has no known allergies. Drug: Lawrence Thornton  reports no history of drug use. Alcohol :  reports no history of alcohol  use. Tobacco:  reports that he quit smoking about 24 years ago. His smoking use included cigarettes. He started smoking about 44 years ago. He has a 29.9 pack-year smoking history. He has never used smokeless tobacco. Social: Lawrence Thornton  reports that he quit smoking about 24 years ago. His smoking use included cigarettes. He started smoking about 44 years ago. He has a 29.9 pack-year smoking history. He has never used smokeless tobacco. He reports that he does not drink alcohol  and does not use drugs. Medical:  has a past medical history of Arthritis and Hypertension. 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 Family: family history includes Cancer in his mother; Heart attack in his mother; Hyperlipidemia in his father; Hypertension in his father.  Laboratory Chemistry Profile   Renal Lab Results  Component Value Date   BUN 12 05/04/2023   CREATININE 0.81 05/04/2023   LABCREA 278 05/04/2023   BCR SEE NOTE: 05/04/2023   GFRAA 108 05/03/2020   GFRNONAA 93 05/03/2020    Hepatic Lab Results  Component Value Date   AST 15 05/04/2023   ALT 18 05/04/2023   ALBUMIN 4.6 11/11/2015   ALKPHOS 76 11/11/2015   HCVAB NEGATIVE 10/06/2016    Electrolytes Lab Results  Component Value Date   NA 139 05/04/2023   K 4.5 05/04/2023   CL 101 05/04/2023  CALCIUM  10.0 05/04/2023    Bone Lab Results  Component Value Date   VD25OH 43 10/06/2016    Inflammation (CRP: Acute Phase) (ESR: Chronic Phase) No results found for: "CRP", "ESRSEDRATE", "LATICACIDVEN"       Note: Above Lab results reviewed.  Recent Imaging Review  MR LUMBAR SPINE W WO CONTRAST CLINICAL DATA:  68 year old male with persistent low back pain. Left leg radicular pain. Prior surgery.  EXAM: MRI LUMBAR SPINE WITHOUT AND WITH CONTRAST  TECHNIQUE: Multiplanar and multiecho pulse sequences of the lumbar spine were obtained without and with intravenous contrast.  CONTRAST:  10mL GADAVIST  GADOBUTROL  1 MMOL/ML IV SOLN  COMPARISON:  Lumbar radiographs 03/16/2013.  FINDINGS: Segmentation:  Normal on the comparison.  Alignment: Straightening of lumbar lordosis not significantly changed since 2014. No significant scoliosis or spondylolisthesis.  Vertebrae: Abundant susceptibility artifact associated with chronic posterior spinal fusion hardware L3 through the sacrum.  Evidence of solid arthrodesis at  L4-L5 and L5-S1, and possibly at the posterior elements of L3-L4. Advanced mostly chronic appearing degenerative endplate marrow changes at L1-L2, see details below. Background bone marrow signal within normal limits. No definite No acute osseous abnormality identified.  Conus medullaris and cauda equina: Conus extends to the L1, extending toward the L1-L2 disc level. Degenerative conus mass effect (series 5, image 10), but no lower spinal cord or conus signal abnormality.  No abnormal intradural enhancement or dural thickening is evident.  Paraspinal and other soft tissues: Postoperative changes to the posterior lumbar paraspinal soft tissues. Negative visible abdominal viscera.  Disc levels:  T12-L1: Some disc space loss with bulky circumferential disc osteophyte complex. Broad-based posterior component and mild to moderate facet and ligament flavum hypertrophy greater on the left. Mild spinal stenosis, with up to mild mass effect on the lower thoracic spinal cord here (series 5, image 10). Mild left lateral recess stenosis (left L1 nerve level). And moderate to severe multifactorial left T12 foraminal stenosis (series 5, image 12).  L1-L2: Severe disc space loss, bulky circumferential disc osteophyte complex. Broad-based posterior component. Relatively mild facet and ligament flavum hypertrophy. Some epidural lipomatosis. Severe spinal stenosis (series 8, image 9) with some mass effect on the conus. Moderate to severe bilateral L1 foraminal stenosis.  L2-L3: Better preserved disc space, but circumferential disc osteophyte complex with a bulky and broad-based posterior component eccentric to the left. Partially visible at least moderate facet and ligament flavum hypertrophy with some epidural lipomatosis.  Severe spinal stenosis, with lateral recess stenosis greater on the left (series 8, image 18, descending left L3 nerve level). Mild to moderate L2 foraminal stenosis  greater on the left.  L3-L4:  Prior decompression and fusion.  No adverse features.  L4-L5: Prior decompression and fusion. Evidence of solid arthrodesis, no adverse features.  L5-S1: Prior decompression and fusion. Evidence of solid arthrodesis. No adverse features.  IMPRESSION: 1. Prior decompression and fusion from L3-L4 through L5-S1 with no adverse features.  2. But bulky disc and endplate degeneration elsewhere T12-L1 through the adjacent L2-L3 segment. Subsequent moderate to severe multifactorial spinal stenosis at L1-L2 (level of the conus), and L2-L3 (left lateral recess more affected). Mild spinal stenosis at T12-L1. Associated mass effect on the lower thoracic spinal cord and conus, but no cord/conus signal abnormality.  3. Associated moderate to severe left T12 and bilateral L1 neural foraminal stenosis. Up to moderate left L2 foraminal stenosis.  Electronically Signed   By: Marlise Simpers M.D.   On: 05/02/2022 08:00 Note: Reviewed  Physical Exam  General appearance: Well nourished, well developed, and well hydrated. In no apparent acute distress Mental status: Alert, oriented x 3 (person, place, & time)       Respiratory: No evidence of acute respiratory distress Eyes: PERLA Vitals: There were no vitals taken for this visit. BMI: Estimated body mass index is 32.69 kg/m as calculated from the following:   Height as of 05/06/23: 6' (1.829 m).   Weight as of 05/06/23: 241 lb (109.3 kg). Ideal: Patient weight not recorded  Assessment   Diagnosis Status  1. Chronic pain syndrome   2. Chronic use of opiate for therapeutic purpose   3. Long term prescription opiate use   4. Degeneration of intervertebral disc of lumbar region with discogenic back pain   5. Encounter for long-term opiate analgesic use    Controlled Controlled Controlled   Plan of Care  Assessment and Plan We will continue with current medication regimen. Routine UDS ordered today.     Pharmacotherapy (Medications Ordered): No orders of the defined types were placed in this encounter.  Orders:  No orders of the defined types were placed in this encounter.  Follow-up plan:   No follow-ups on file.    Recent Visits Date Type Provider Dept  05/06/23 Office Visit Cephus Collin, MD [facility]  Showing recent visits within past 90 days and meeting all other requirements Future Appointments Date Type Provider Dept  07/27/23 Appointment Cephus Collin, MD [facility]  Showing future appointments within next 90 days and meeting all other requirements  I discussed the assessment and treatment plan with the patient. The patient was provided an opportunity to ask questions and all were answered. The patient agreed with the plan and demonstrated an understanding of the instructions.  Patient advised to call back or seek an in-person evaluation if the symptoms or condition worsens.  Duration of encounter: *** minutes.  Total time on encounter, as per AMA guidelines included both the face-to-face and non-face-to-face time personally spent by the physician and/or other qualified health care professional(s) on the day of the encounter (includes time in activities that require the physician or other qualified health care professional and does not include time in activities normally performed by clinical staff). Physician's time may include the following activities when performed: Preparing to see the patient (e.g., pre-charting review of records, searching for previously ordered imaging, lab work, and nerve conduction tests) Review of prior analgesic pharmacotherapies. Reviewing PMP Interpreting ordered tests (e.g., lab work, imaging, nerve conduction tests) Performing post-procedure evaluations, including interpretation of diagnostic procedures Obtaining and/or reviewing separately obtained history Performing a medically appropriate examination and/or  evaluation Counseling and educating the patient/family/caregiver Ordering medications, tests, or procedures Referring and communicating with other health care professionals (when not separately reported) Documenting clinical information in the electronic or other health record Independently interpreting results (not separately reported) and communicating results to the patient/ family/caregiver Care coordination (not separately reported)  Note by: Fredie Majano K Beckey Polkowski, NP (TTS and AI technology used. I apologize for any typographical errors that were not detected and corrected.) Date: 07/27/2023; Time: 4:20 PM

## 2023-07-27 ENCOUNTER — Encounter: Payer: Self-pay | Admitting: Student in an Organized Health Care Education/Training Program

## 2023-07-27 ENCOUNTER — Ambulatory Visit
Payer: Medicare Other | Attending: Student in an Organized Health Care Education/Training Program | Admitting: Student in an Organized Health Care Education/Training Program

## 2023-07-27 VITALS — BP 115/73 | HR 80 | Temp 97.5°F | Resp 16 | Ht 72.0 in | Wt 240.0 lb

## 2023-07-27 DIAGNOSIS — G894 Chronic pain syndrome: Secondary | ICD-10-CM | POA: Diagnosis not present

## 2023-07-27 DIAGNOSIS — Z79891 Long term (current) use of opiate analgesic: Secondary | ICD-10-CM | POA: Insufficient documentation

## 2023-07-27 DIAGNOSIS — M5136 Other intervertebral disc degeneration, lumbar region with discogenic back pain only: Secondary | ICD-10-CM | POA: Insufficient documentation

## 2023-07-27 MED ORDER — OXYCODONE HCL ER 20 MG PO T12A: 20.0000 mg | EXTENDED_RELEASE_TABLET | Freq: Two times a day (BID) | ORAL | 0 refills | Status: AC

## 2023-07-27 MED ORDER — OXYCODONE-ACETAMINOPHEN 10-325 MG PO TABS: 1.0000 | ORAL_TABLET | Freq: Two times a day (BID) | ORAL | 0 refills | Status: DC | PRN

## 2023-07-27 MED ORDER — OXYCODONE HCL ER 20 MG PO T12A: 20.0000 mg | EXTENDED_RELEASE_TABLET | Freq: Two times a day (BID) | ORAL | 0 refills | Status: DC

## 2023-07-27 NOTE — Progress Notes (Signed)
 Nursing Pain Medication Assessment:  Safety precautions to be maintained throughout the outpatient stay will include: orient to surroundings, keep bed in low position, maintain call bell within reach at all times, provide assistance with transfer out of bed and ambulation.  Medication Inspection Compliance: Pill count conducted under aseptic conditions, in front of the patient. Neither the pills nor the bottle was removed from the patient's sight at any time. Once count was completed pills were immediately returned to the patient in their original bottle.  Medication #1: Oxycodone  IR Pill/Patch Count:  34 of 60 pills remain Pill/Patch Appearance: Markings consistent with prescribed medication Bottle Appearance: Standard pharmacy container. Clearly labeled. Filled Date: 04 / 09 / 2025 Last Medication intake:  Yesterday  Medication #2: Oxycodone /APAP Pill/Patch Count:  35 of 60 pills remain Pill/Patch Appearance: Markings consistent with prescribed medication Bottle Appearance: Standard pharmacy container. Clearly labeled. Filled Date: 04 / 07 / 2025 Last Medication intake:  Yesterday

## 2023-07-27 NOTE — Patient Instructions (Signed)

## 2023-07-30 ENCOUNTER — Other Ambulatory Visit: Payer: Self-pay | Admitting: Family Medicine

## 2023-07-30 DIAGNOSIS — E1169 Type 2 diabetes mellitus with other specified complication: Secondary | ICD-10-CM

## 2023-07-30 LAB — TOXASSURE SELECT 13 (MW), URINE

## 2023-08-04 ENCOUNTER — Other Ambulatory Visit: Payer: Self-pay | Admitting: Family Medicine

## 2023-08-04 DIAGNOSIS — E1169 Type 2 diabetes mellitus with other specified complication: Secondary | ICD-10-CM

## 2023-08-05 ENCOUNTER — Ambulatory Visit: Payer: Self-pay

## 2023-08-05 VITALS — BP 124/62 | Ht 72.0 in | Wt 234.5 lb

## 2023-08-05 DIAGNOSIS — Z0001 Encounter for general adult medical examination with abnormal findings: Secondary | ICD-10-CM

## 2023-08-05 DIAGNOSIS — E1159 Type 2 diabetes mellitus with other circulatory complications: Secondary | ICD-10-CM | POA: Diagnosis not present

## 2023-08-05 DIAGNOSIS — I152 Hypertension secondary to endocrine disorders: Secondary | ICD-10-CM

## 2023-08-05 DIAGNOSIS — Z0000 Encounter for general adult medical examination without abnormal findings: Secondary | ICD-10-CM

## 2023-08-05 DIAGNOSIS — Z7985 Long-term (current) use of injectable non-insulin antidiabetic drugs: Secondary | ICD-10-CM | POA: Diagnosis not present

## 2023-08-05 NOTE — Progress Notes (Signed)
 Subjective:   Lawrence Thornton. is a 68 y.o. who presents for a Medicare Wellness preventive visit.  Visit Complete: In person   Persons Participating in Visit: Patient.  AWV Questionnaire: No: Patient Medicare AWV questionnaire was not completed prior to this visit.  Cardiac Risk Factors include: advanced age (>16men, >63 women);dyslipidemia;male gender;diabetes mellitus;obesity (BMI >30kg/m2)     Objective:    Today's Vitals   08/05/23 0803 08/05/23 0805  BP: 124/62   Weight: 234 lb 8 oz (106.4 kg)   Height: 6' (1.829 m)   PainSc:  3    Body mass index is 31.8 kg/m.     08/05/2023    8:13 AM 07/27/2023    7:57 AM 05/06/2023    7:58 AM 02/09/2023    8:08 AM 11/03/2022    8:44 AM 07/30/2022    8:31 AM 06/30/2022    8:39 AM  Advanced Directives  Does Patient Have a Medical Advance Directive? Yes Yes Yes Yes Yes Yes Yes  Type of Estate agent of [place];Living will   Healthcare Power of [place];Living will  Healthcare Power of [place];Living will Living will  Does patient want to make changes to medical advance directive?   No - Patient declined      Copy of Healthcare Power of Attorney in Chart? No - copy requested   No - copy requested  No - copy requested     Current Medications (verified) Outpatient Encounter Medications as of 08/05/2023  Medication Sig   amLODIPine -Valsartan -HCTZ 10-320-25 MG TABS One tablet by mouth once daily   aspirin  EC 81 MG tablet Take 1 tablet (81 mg total) by mouth daily.   atorvastatin  (LIPITOR) 20 MG tablet Take 1 tablet (20 mg total) by mouth daily.   linaclotide  (LINZESS ) 72 MCG capsule Take 1 capsule (72 mcg total) by mouth daily before breakfast.   naloxone  (NARCAN ) 2 MG/2ML injection Inject 1 mL (1 mg total) into the muscle as needed for up to 2 doses (for opioid overdose). Inject content of syringe into thigh muscle. Call 911.   omega-3 acid ethyl esters (LOVAZA ) 1 g capsule Take 2 capsules (2 g total) by  mouth 2 (two) times daily.   [START ON 08/13/2023] oxyCODONE  (OXYCONTIN ) 20 mg 12 hr tablet Take 1 tablet (20 mg total) by mouth every 12 (twelve) hours. Months last 30 days.   [START ON 09/12/2023] oxyCODONE  (OXYCONTIN ) 20 mg 12 hr tablet Take 1 tablet (20 mg total) by mouth every 12 (twelve) hours. Months last 30 days.   [START ON 10/12/2023] oxyCODONE  (OXYCONTIN ) 20 mg 12 hr tablet Take 1 tablet (20 mg total) by mouth every 12 (twelve) hours. Months last 30 days.   [START ON 08/12/2023] oxyCODONE -acetaminophen  (PERCOCET) 10-325 MG tablet Take 1 tablet by mouth 2 (two) times daily as needed for pain.   [START ON 09/11/2023] oxyCODONE -acetaminophen  (PERCOCET) 10-325 MG tablet Take 1 tablet by mouth 2 (two) times daily as needed for pain.   [START ON 10/11/2023] oxyCODONE -acetaminophen  (PERCOCET) 10-325 MG tablet Take 1 tablet by mouth 2 (two) times daily as needed for pain.   Semaglutide , 2 MG/DOSE, 8 MG/3ML SOPN Inject 2 mg as directed once a week.   sildenafil  (VIAGRA ) 100 MG tablet Take 1 tablet (100 mg total) by mouth daily as needed for erectile dysfunction.   tamsulosin  (FLOMAX ) 0.4 MG CAPS capsule Take 1 capsule (0.4 mg total) by mouth daily.   tiZANidine  (ZANAFLEX ) 4 MG tablet Take 1 tablet (4 mg total) by  mouth 2 (two) times daily as needed for muscle spasms.   No facility-administered encounter medications on file as of 08/05/2023.    Allergies (verified) Patient has no known allergies.   History: Past Medical History:  Diagnosis Date   Arthritis    oa   Hypertension    Past Surgical History:  Procedure Laterality Date   APPENDECTOMY  age 33   arthroscopic shoulder surgery Right 08/31/2017   BACK SURGERY  lower back surgery x 3    L 3 L 4 and L 5 are fused together   COLONOSCOPY WITH PROPOFOL  N/A 05/04/2017   Procedure: COLONOSCOPY WITH PROPOFOL ;  Surgeon: Marnee Sink, MD;  Location: [facility] ENDOSCOPY;  Service: Endoscopy;  Laterality: N/A;   COLONOSCOPY WITH PROPOFOL  N/A 06/30/2022    Procedure: COLONOSCOPY WITH PROPOFOL ;  Surgeon: Marnee Sink, MD;  Location: [facility] ENDOSCOPY;  Service: Endoscopy;  Laterality: N/A;   LUMBAR FUSION  1999   L- to L5   metal removed from eye  years ago   sebaceous cyst removed Right 15 years ago   middle  finger    TOE FUSION Right    4th toe   TOTAL KNEE ARTHROPLASTY Left 08/10/2014   Procedure: LEFT TOTAL KNEE ARTHROPLASTY;  Surgeon: Genevie Kerns, MD;  Location: [facility] ORS;  Service: Orthopedics;  Laterality: Left;   TOTAL KNEE ARTHROPLASTY Right 12/21/2014   Procedure: RIGHT TOTAL KNEE ARTHROPLASTY;  Surgeon: Genevie Kerns, MD;  Location: [facility] ORS;  Service: Orthopedics;  Laterality: Right;   TOTAL KNEE ARTHROPLASTY Right 12/21/14   Family History  Problem Relation Age of Onset   Cancer Mother        Throat   Heart attack Mother    Hypertension Father    Hyperlipidemia Father    Social History   Socioeconomic History   Marital status: Married    Spouse name: Amalia Badder   Number of children: 0   Years of education: Not on file   Highest education level: Not on file  Occupational History   Occupation: mill write work   Tobacco Use   Smoking status: Former    Current packs/day: 0.00    Average packs/day: 1.5 packs/day for 19.9 years (29.9 ttl pk-yrs)    Types: Cigarettes    Start date: 04/07/1979    Quit date: 04/07/1999    Years since quitting: 24.3   Smokeless tobacco: Never   Tobacco comments:    not smoking   Vaping Use   Vaping status: Never Used  Substance and Sexual Activity   Alcohol  use: No   Drug use: No   Sexual activity: Not Currently    Birth control/protection: Condom  Other Topics Concern   Not on file  Social History Narrative   Not on file   Social Drivers of Health   Financial Resource Strain: Low Risk  (08/05/2023)   Overall Financial Resource Strain (CARDIA)    Difficulty of Paying Living Expenses: Not hard at all  Food Insecurity: No Food Insecurity (08/05/2023)   Hunger Vital Sign    Worried About  Running Out of Food in the Last Year: Never true    Ran Out of Food in the Last Year: Never true  Transportation Needs: No Transportation Needs (08/05/2023)   PRAPARE - Administrator, Civil Service (Medical): No    Lack of Transportation (Non-Medical): No  Physical Activity: Insufficiently Active (08/05/2023)   Exercise Vital Sign    Days of Exercise per Week: 7 days    Minutes  of Exercise per Session: 20 min  Stress: No Stress Concern Present (08/05/2023)   [employer] of Occupational Health - Occupational Stress Questionnaire    Feeling of Stress : Only a little  Social Connections: Socially Isolated (08/05/2023)   Social Connection and Isolation Panel [NHANES]    Frequency of Communication with Friends and Family: Twice a week    Frequency of Social Gatherings with Friends and Family: Never    Attends Religious Services: Never    Diplomatic Services operational officer: No    Attends Engineer, structural: Never    Marital Status: Married    Tobacco Counseling Counseling given: Not Answered Tobacco comments: not smoking     Clinical Intake:  Pre-visit preparation completed: Yes  Pain : 0-10 Pain Score: 3  Pain Type: Chronic pain Pain Location: Back Pain Orientation: Lower Pain Descriptors / Indicators: Aching, Discomfort, Constant Pain Onset: More than a month ago Pain Frequency: Constant Pain Relieving Factors: meds  Pain Relieving Factors: meds  BMI - recorded: 31.8 Nutritional Status: BMI > 30  Obese Nutritional Risks: None Diabetes: Yes CBG done?: No Did pt. bring in CBG monitor from home?: No  Lab Results  Component Value Date   HGBA1C 5.6 03/24/2023   HGBA1C 5.3 10/29/2022   HGBA1C 5.6 04/30/2022     How often do you need to have someone help you when you read instructions, pamphlets, or other written materials from your doctor or pharmacy?: 1 - Never  Interpreter Needed?: No  Information entered by :: Dellie Fergusson,  LPN   Activities of Daily Living     08/05/2023    8:14 AM 10/29/2022    7:44 AM  In your present state of health, do you have any difficulty performing the following activities:  Hearing? 0 0  Vision? 0 0  Difficulty concentrating or making decisions? 0 0  Walking or climbing stairs? 0 0  Dressing or bathing? 0 0  Doing errands, shopping? 0 0  Preparing Food and eating ? N   Using the Toilet? N   In the past six months, have you accidently leaked urine? N   Do you have problems with loss of bowel control? N   Managing your Medications? N   Managing your Finances? N   Housekeeping or managing your Housekeeping? N     Patient Care Team: Sowles, Krichna, MD as PCP - General (Family Medicine) Cephus Collin, MD as Consulting Physician (Pain Medicine) Reche Canales, OD (Optometry)  Indicate any recent Medical Services you may have received from other than [facility] providers in the past year (date may be approximate).     Assessment:   This is a routine wellness examination for [facility].  Hearing/Vision screen Hearing Screening - Comments:: NO AIDS Vision Screening - Comments:: WEARS GLASSES ALL DAY- NICE- HAD APPT A FEW WEEKS AGO   Goals Addressed             This Visit's Progress    DIET - EAT MORE FRUITS AND VEGETABLES         Depression Screen     08/05/2023    8:10 AM 07/27/2023    7:57 AM 05/06/2023    7:58 AM 05/04/2023    7:54 AM 03/24/2023    8:02 AM 02/09/2023    8:08 AM 11/03/2022    8:43 AM  PHQ 2/9 Scores  PHQ - 2 Score 0 0 0 0 0 0 0  PHQ- 9 Score 0   0 0  Fall Risk     08/05/2023    8:14 AM 07/27/2023    7:57 AM 05/06/2023    7:58 AM 05/04/2023    7:49 AM 03/24/2023    8:01 AM  Fall Risk   Falls in the past year? 0 0 0 0 0  Number falls in past yr: 0   0 0  Injury with Fall? 0   0 0  Risk for fall due to : No Fall Risks   No Fall Risks No Fall Risks  Follow up Falls prevention discussed;Falls evaluation completed   Falls prevention  discussed;Education provided;Falls evaluation completed Falls prevention discussed;Education provided;Falls evaluation completed    MEDICARE RISK AT HOME:  Medicare Risk at Home Any stairs in or around the home?: Yes If so, are there any without handrails?: No Home free of loose throw rugs in walkways, pet beds, electrical cords, etc?: Yes Adequate lighting in your home to reduce risk of falls?: Yes Life alert?: No Use of a cane, walker or w/c?: No Grab bars in the bathroom?: Yes Shower chair or bench in shower?: Yes Elevated toilet seat or a handicapped toilet?: Yes  TIMED UP AND GO:  Was the test performed?  Yes  Length of time to ambulate 10 feet: 4 sec Gait steady and fast without use of assistive device  Cognitive Function: 6CIT completed        08/05/2023    8:16 AM 07/30/2022    8:31 AM  6CIT Screen  What Year? 0 points 0 points  What month? 0 points 0 points  What time? 0 points 0 points  Count back from 20 0 points 0 points  Months in reverse 2 points 0 points  Repeat phrase 0 points 0 points  Total Score 2 points 0 points    Immunizations Immunization History  Administered Date(s) Administered   Fluad Trivalent(High Dose 65+) 03/24/2023   Influenza Split 01/05/2012   Influenza, High Dose Seasonal PF 05/05/2021   Influenza, Seasonal, Injecte, Preservative Fre 12/05/2012, 12/28/2013   Influenza,inj,Quad PF,6+ Mos 02/22/2015, 01/07/2017, 01/12/2018, 01/27/2019, 01/02/2020   Influenza-Unspecified 11/12/2015, 01/27/2019, 02/16/2022   Janssen (J&J) SARS-COV-2 Vaccination 06/23/2019   Moderna SARS-COV2 Booster Vaccination 07/04/2020, 10/28/2020   Moderna Sars-Covid-2 Vaccination 01/31/2020   PNEUMOCOCCAL CONJUGATE-20 05/05/2021   Pneumococcal Polysaccharide-23 08/13/2017   Tdap 05/07/2011, 10/31/2021   Zoster Recombinant(Shingrix ) 10/06/2016, 08/13/2017    Screening Tests Health Maintenance  Topic Date Due   COVID-19 Vaccine (5 - 2024-25 season) 12/06/2022    HEMOGLOBIN A1C  09/22/2023   FOOT EXAM  10/29/2023   INFLUENZA VACCINE  11/05/2023   Diabetic kidney evaluation - eGFR measurement  05/03/2024   Diabetic kidney evaluation - Urine ACR  05/03/2024   OPHTHALMOLOGY EXAM  06/16/2024   Medicare Annual Wellness (AWV)  08/04/2024   Colonoscopy  06/29/2029   DTaP/Tdap/Td (3 - Td or Tdap) 11/01/2031   Pneumonia Vaccine 64+ Years old  Completed   Hepatitis C Screening  Completed   Zoster Vaccines- Shingrix   Completed   HPV VACCINES  Aged Out   Meningococcal B Vaccine  Aged Out    Health Maintenance  Health Maintenance Due  Topic Date Due   COVID-19 Vaccine (5 - 2024-25 season) 12/06/2022   Health Maintenance Items Addressed: UP TO DATE ON COLONOSCOPY; UP TO DATE ON SHOTS, WANTS NO MORE COVID  Additional Screening:  Vision Screening: Recommended annual ophthalmology exams for early detection of glaucoma and other disorders of the eye.  Dental Screening: Recommended annual dental exams for  proper oral hygiene  Community Resource Referral / Chronic Care Management: CRR required this visit?  No   CCM required this visit?  No     Plan:     I have personally reviewed and noted the following in the patient's chart:   Medical and social history Use of alcohol , tobacco or illicit drugs  Current medications and supplements including opioid prescriptions. Patient is currently taking opioid prescriptions. Information provided to patient regarding non-opioid alternatives. Patient advised to discuss non-opioid treatment plan with their provider. Functional ability and status Nutritional status Physical activity Advanced directives List of other physicians Hospitalizations, surgeries, and ER visits in previous 12 months Vitals Screenings to include cognitive, depression, and falls Referrals and appointments  In addition, I have reviewed and discussed with patient certain preventive protocols, quality metrics, and best practice  recommendations. A written personalized care plan for preventive services as well as general preventive health recommendations were provided to patient.     Pinky Bright, LPN   04/11/1094   After Visit Summary: (In Person-Declined) Patient declined AVS at this time.  Notes: DM LABS ORDERED

## 2023-08-05 NOTE — Patient Instructions (Addendum)
 Lawrence Thornton , Thank you for taking time to come for your Medicare Wellness Visit. I appreciate your ongoing commitment to your health goals. Please review the following plan we discussed and let me know if I can assist you in the future.   Referrals/Orders/Follow-Ups/Clinician Recommendations: NONE  This is a list of the screening recommended for you and due dates:  Health Maintenance  Topic Date Due   COVID-19 Vaccine (5 - 2024-25 season) 12/06/2022   Hemoglobin A1C  09/22/2023   Complete foot exam   10/29/2023   Flu Shot  11/05/2023   Yearly kidney function blood test for diabetes  05/03/2024   Yearly kidney health urinalysis for diabetes  05/03/2024   Eye exam for diabetics  06/16/2024   Medicare Annual Wellness Visit  08/04/2024   Colon Cancer Screening  06/29/2029   DTaP/Tdap/Td vaccine (3 - Td or Tdap) 11/01/2031   Pneumonia Vaccine  Completed   Hepatitis C Screening  Completed   Zoster (Shingles) Vaccine  Completed   HPV Vaccine  Aged Out   Meningitis B Vaccine  Aged Out    Advanced directives: (ACP Link)Information on Advanced Care Planning can be found at [place]  Secretary of State Advance Health Care Directives Advance Health Care Directives. [email or web address]   Next Medicare Annual Wellness Visit scheduled for next year: Yes  08/10/24 @ 9:30 AM BY PHONE

## 2023-08-23 ENCOUNTER — Other Ambulatory Visit: Payer: Self-pay | Admitting: Family Medicine

## 2023-08-23 DIAGNOSIS — E1169 Type 2 diabetes mellitus with other specified complication: Secondary | ICD-10-CM

## 2023-08-23 NOTE — Telephone Encounter (Signed)
 Copied from CRM [id number]. Topic: Clinical - Medication Refill >> Aug 23, 2023  8:22 AM Howard Macho wrote: Medication: Semaglutide , 2 MG/DOSE, 8 MG/3ML SOPN  Has the patient contacted their pharmacy? No (Agent: If no, request that the patient contact the pharmacy for the refill. If patient does not wish to contact the pharmacy document the reason why and proceed with request.) (Agent: If yes, when and what did the pharmacy advise?)  This is the patient's preferred pharmacy:   [id number] - [place], [place] - [address] [place] [address] Phone: [phone] Fax: [phone]  Is this the correct pharmacy for this prescription? Yes If no, delete pharmacy and type the correct one.   Has the prescription been filled recently? No  Is the patient out of the medication? Yes  Has the patient been seen for an appointment in the last year OR does the patient have an upcoming appointment? Yes  Can we respond through MyChart? No  Agent: Please be advised that Rx refills may take up to 3 business days. We ask that you follow-up with your pharmacy.

## 2023-08-25 MED ORDER — SEMAGLUTIDE (2 MG/DOSE) 8 MG/3ML ~~LOC~~ SOPN: 2.0000 mg | PEN_INJECTOR | SUBCUTANEOUS | 1 refills | Status: DC

## 2023-08-25 NOTE — Telephone Encounter (Signed)
 Requested Prescriptions  Pending Prescriptions Disp Refills   Semaglutide , 2 MG/DOSE, 8 MG/3ML SOPN 9 mL 1    Sig: Inject 2 mg as directed once a week.     Endocrinology:  Diabetes - GLP-1 Receptor Agonists - semaglutide  Passed - 08/25/2023  9:46 AM      Passed - HBA1C in normal range and within 180 days    Hemoglobin A1C  Date Value Ref Range Status  03/24/2023 5.6 4.0 - 5.6 % Final   HbA1c, POC (controlled diabetic range)  Date Value Ref Range Status  01/31/2019 5.8 0.0 - 7.0 % Final         Passed - Cr in normal range and within 360 days    Creat  Date Value Ref Range Status  05/04/2023 0.81 0.70 - 1.35 mg/dL Final   Creatinine, Urine  Date Value Ref Range Status  05/04/2023 278 20 - 320 mg/dL Final         Passed - Valid encounter within last 6 months    Recent Outpatient Visits   None     Future Appointments             In 1 month Sowles, Krichna, MD [facility]   In 8 months Sowles, Krichna, MD [facility], [facility]

## 2023-08-31 DIAGNOSIS — K080 Exfoliation of teeth due to systemic causes: Secondary | ICD-10-CM | POA: Diagnosis not present

## 2023-09-14 ENCOUNTER — Other Ambulatory Visit: Payer: Self-pay | Admitting: Family Medicine

## 2023-09-14 DIAGNOSIS — E1169 Type 2 diabetes mellitus with other specified complication: Secondary | ICD-10-CM

## 2023-09-28 ENCOUNTER — Encounter: Payer: Self-pay | Admitting: Family Medicine

## 2023-09-28 ENCOUNTER — Ambulatory Visit: Payer: Self-pay | Admitting: Family Medicine

## 2023-09-28 VITALS — BP 112/72 | HR 79 | Resp 16 | Ht 72.0 in | Wt 232.9 lb

## 2023-09-28 DIAGNOSIS — E1169 Type 2 diabetes mellitus with other specified complication: Secondary | ICD-10-CM

## 2023-09-28 DIAGNOSIS — I152 Hypertension secondary to endocrine disorders: Secondary | ICD-10-CM

## 2023-09-28 DIAGNOSIS — E785 Hyperlipidemia, unspecified: Secondary | ICD-10-CM

## 2023-09-28 DIAGNOSIS — N401 Enlarged prostate with lower urinary tract symptoms: Secondary | ICD-10-CM

## 2023-09-28 DIAGNOSIS — E1159 Type 2 diabetes mellitus with other circulatory complications: Secondary | ICD-10-CM

## 2023-09-28 DIAGNOSIS — F1120 Opioid dependence, uncomplicated: Secondary | ICD-10-CM | POA: Diagnosis not present

## 2023-09-28 DIAGNOSIS — G894 Chronic pain syndrome: Secondary | ICD-10-CM

## 2023-09-28 DIAGNOSIS — R351 Nocturia: Secondary | ICD-10-CM

## 2023-09-28 DIAGNOSIS — Z7985 Long-term (current) use of injectable non-insulin antidiabetic drugs: Secondary | ICD-10-CM

## 2023-09-28 DIAGNOSIS — M7662 Achilles tendinitis, left leg: Secondary | ICD-10-CM

## 2023-09-28 LAB — POCT GLYCOSYLATED HEMOGLOBIN (HGB A1C): Hemoglobin A1C: 5.2 % (ref 4.0–5.6)

## 2023-09-28 MED ORDER — CELECOXIB 100 MG PO CAPS: 100.0000 mg | ORAL_CAPSULE | Freq: Two times a day (BID) | ORAL | 0 refills | Status: DC

## 2023-09-28 NOTE — Progress Notes (Signed)
 Name: Lawrence Thornton.   MRN: [id number]    DOB: 11-24-1955   Date:09/28/2023       Progress Note  Subjective  Chief Complaint  Chief Complaint  Patient presents with   Medical Management of Chronic Issues   HPI   DMII: diagnosed May 2019 with a A1C was 6.9% he was  on Metformin  750 mg daily but stopped due to side effects. He has been on Ozempic  since July 202 , weight was 279 lbs, weight today is down to 232 lbs , A1C is normal but medication is keeping healthy and denies hypoglycemic episodes .  He denies polydipsia ( but states likes water), no polyphagia, has nocturia from BPH that is better with Tamsulosin  at night .  He is taking Lovaza  two daily instead of four daily  and Atorvastatin    Achilles tendinitis, symptoms started about 6 weeks ago, pain behind left heel, doing stretching exercises. Worse when he first stands up.    Dyslipidemia: he is on Lovaza  and Atorvastatin  and last LDL was 44, we will recheck labs yearly    Chronic pain and opioid use: he was given Linzess  and only takes it prn, very expensive .  He states bowel movements are now daily, but states needs to take medication occasionally to help him have a bowel movement, he states going about 4 days without a bowel movement because he only takes it prn and when he takes he has diarrhea, advised to go down on dose and consider miralax . He is under the care of NP [facility] and Dr. Marcelino, pain level now is 3/10 right lower back, early in 2024 he developed Left lower extremity radiculopathy, so  he had a repeat MRI that showed L3-L4 through L5-S1 showed prior decompression and fusion that were stable but new findings on T12-L1 and L2-L3 with DDD and some bulky disc. He is not ready for any more procedures at this time   Obesity: his BMI is now below 35, he is doing well on Ozempic  , no side effects   BPH: Taking Tamsulosin  at night and gets up twice to void but states sometimes only once.  Last PSA was normal January 2025    HTN: no chest pain, palpitation or SOB. No side effects of medication BP is at goal today,  continue current regiment for now and monitor for low bp    Patient Active Problem List   Diagnosis Date Noted   Achilles tendinitis of left lower extremity 09/28/2023   Hypertension associated with diabetes (HCC) 09/28/2023   Spinal stenosis, lumbar region, with neurogenic claudication 05/20/2022   Radicular pain of left lower extremity 04/21/2022   Chronic use of opiate for therapeutic purpose 12/15/2018   Lumbar spondylosis 03/15/2018   Long term current use of opiate analgesic 12/15/2017   Dyslipidemia associated with type 2 diabetes mellitus (HCC) 08/13/2017   Osteoarthritis of acromioclavicular joint 07/30/2017   S/P lumbar fusion 07/13/2017   Chronic pain syndrome 07/13/2017   History of colonic polyps    Constipation due to opioid therapy 07/18/2015   Hypertriglyceridemia 01/22/2015   S/P knee replacement 12/21/2014   Chronic right-sided low back pain without sciatica 09/21/2014   BPH associated with nocturia 09/21/2014   Dyslipidemia 09/21/2014   Continuous opioid dependence (HCC) 09/21/2014   Lumbar degenerative disc disease 09/21/2014   Failure of erection 09/21/2014    Past Surgical History:  Procedure Laterality Date   APPENDECTOMY  age 66   arthroscopic shoulder surgery Right 08/31/2017  BACK SURGERY  lower back surgery x 3    L 3 L 4 and L 5 are fused together   COLONOSCOPY WITH PROPOFOL  N/A 05/04/2017   Procedure: COLONOSCOPY WITH PROPOFOL ;  Surgeon: Jinny Carmine, MD;  Location: [facility] ENDOSCOPY;  Service: Endoscopy;  Laterality: N/A;   COLONOSCOPY WITH PROPOFOL  N/A 06/30/2022   Procedure: COLONOSCOPY WITH PROPOFOL ;  Surgeon: Jinny Carmine, MD;  Location: [facility] END[facility]COPY;  Service: Endoscopy;  Laterality: N/A;   LUMBAR FUSION  1999   L- to L5   metal removed from eye  years ago   sebaceous cyst removed Right 15 years ago   middle  finger    TOE FUSION Right    4th toe    TOTAL KNEE ARTHROPLASTY Left 08/10/2014   Procedure: LEFT TOTAL KNEE ARTHROPLASTY;  Surgeon: Lamar Collet, MD;  Location: [facility];  Service: Orthopedics;  Laterality: Left;   TOTAL KNEE ARTHROPLASTY Right 12/21/2014   Procedure: RIGHT TOTAL KNEE ARTHROPLASTY;  Surgeon: Lamar Collet, MD;  Location: [facility];  Service: Orthopedics;  Laterality: Right;   TOTAL KNEE ARTHROPLASTY Right 12/21/14    Family History  Problem Relation Age of Onset   Cancer Mother        Throat   Heart attack Mother    Hypertension Father    Hyperlipidemia Father     Social History   Tobacco Use   Smoking status: Former    Current packs/day: 0.00    Average packs/day: 1.5 packs/day for 19.9 years (29.9 ttl pk-yrs)    Types: Cigarettes    Start date: 04/07/1979    Quit date: 04/07/1999    Years since quitting: 24.4   Smokeless tobacco: Never   Tobacco comments:    not smoking   Substance Use Topics   Alcohol  use: No     Current Outpatient Medications:    amLODIPine -Valsartan -HCTZ 10-320-25 MG TABS, One tablet by mouth once daily, Disp: 90 tablet, Rfl: 0   aspirin  EC 81 MG tablet, Take 1 tablet (81 mg total) by mouth daily., Disp: 30 tablet, Rfl: 0   atorvastatin  (LIPITOR) 20 MG tablet, TAKE 1 TABLET BY MOUTH ONCE A DAY, Disp: 30 tablet, Rfl: 0   celecoxib (CELEBREX) 100 MG capsule, Take 1 capsule (100 mg total) by mouth 2 (two) times daily., Disp: 60 capsule, Rfl: 0   linaclotide  (LINZESS ) 72 MCG capsule, Take 1 capsule (72 mcg total) by mouth daily before breakfast., Disp: 90 capsule, Rfl: 1   naloxone  (NARCAN ) 2 MG/2ML injection, Inject 1 mL (1 mg total) into the muscle as needed for up to 2 doses (for opioid overdose). Inject content of syringe into thigh muscle. Call 911., Disp: 2 mL, Rfl: 1   omega-3 acid ethyl esters (LOVAZA ) 1 g capsule, Take 2 capsules (2 g total) by mouth 2 (two) times daily., Disp: 360 capsule, Rfl: 1   oxyCODONE  (OXYCONTIN ) 20 mg 12 hr tablet, Take 1 tablet (20 mg total) by mouth  every 12 (twelve) hours. Months last 30 days., Disp: 60 tablet, Rfl: 0   [START ON 10/12/2023] oxyCODONE  (OXYCONTIN ) 20 mg 12 hr tablet, Take 1 tablet (20 mg total) by mouth every 12 (twelve) hours. Months last 30 days., Disp: 60 tablet, Rfl: 0   oxyCODONE -acetaminophen  (PERCOCET) 10-325 MG tablet, Take 1 tablet by mouth 2 (two) times daily as needed for pain., Disp: 60 tablet, Rfl: 0   oxyCODONE -acetaminophen  (PERCOCET) 10-325 MG tablet, Take 1 tablet by mouth 2 (two) times daily as needed for pain., Disp: 60 tablet,  Rfl: 0   [START ON 10/11/2023] oxyCODONE -acetaminophen  (PERCOCET) 10-325 MG tablet, Take 1 tablet by mouth 2 (two) times daily as needed for pain., Disp: 60 tablet, Rfl: 0   Semaglutide , 2 MG/DOSE, 8 MG/3ML SOPN, Inject 2 mg as directed once a week., Disp: 9 mL, Rfl: 1   sildenafil  (VIAGRA ) 100 MG tablet, Take 1 tablet (100 mg total) by mouth daily as needed for erectile dysfunction., Disp: 30 tablet, Rfl: 0   tamsulosin  (FLOMAX ) 0.4 MG CAPS capsule, Take 1 capsule (0.4 mg total) by mouth daily., Disp: 90 capsule, Rfl: 1   tiZANidine  (ZANAFLEX ) 4 MG tablet, Take 1 tablet (4 mg total) by mouth 2 (two) times daily as needed for muscle spasms., Disp: 60 tablet, Rfl: 5  No Known Allergies  I personally reviewed active problem list, medication list, allergies with the patient/caregiver today.   ROS  Ten systems reviewed and is negative except as mentioned in HPI    Objective Physical Exam Constitutional: Patient appears well-developed and well-nourished. Obese  No distress.  HEENT: head atraumatic, normocephalic, pupils equal and reactive to light, neck supple Cardiovascular: Normal rate, regular rhythm and normal heart sounds.  No murmur heard. No BLE edema. Pulmonary/Chest: Effort normal and breath sounds normal. No respiratory distress. Abdominal: Soft.  There is no tenderness. Psychiatric: Patient has a normal mood and affect. behavior is normal. Judgment and thought content  normal.   Vitals:   09/28/23 0837  BP: 112/72  Pulse: 79  Resp: 16  SpO2: 98%  Weight: 232 lb 14.4 oz (105.6 kg)  Height: 6' (1.829 m)    Body mass index is 31.59 kg/m.  Recent Results (from the past 2160 hours)  [employer]ox[employer] Select 13 (MW), Urine     Status: None   Collection Time: 07/27/23  8:20 AM  Result Value Ref Range   Summary FINAL     Comment: ==================================================================== [employer] Select 13 (MW) ==================================================================== Test                             Result       Flag       Units  Drug Present and Declared for Prescription Verification   Oxycodone                       4612         EXPECTED   ng/mg creat   Oxymorphone                    2721         EXPECTED   ng/mg creat   Noroxycodone                   5425         EXPECTED   ng/mg creat   Noroxymorphone                 837          EXPECTED   ng/mg creat    Sources of oxycodone  are scheduled prescription medications.    Oxymorphone, noroxycodone, and noroxymorphone are expected    metabolites of oxycodone . Oxymorphone is also available as a    scheduled prescription medication.  ==================================================================== Test                      Result    Flag   Units      Ref Range  Creatinine              91               mg/dL      >=79 == ================================================================== Declared Medications:  The flagging and interpretation on this report are based on the  following declared medications.  Unexpected results may arise from  inaccuracies in the declared medications.   **Note: The testing scope of this panel includes these medications:   Oxycodone  (Oxycontin )  Oxycodone  (Percocet)   **Note: The testing scope of this panel does not include the  following reported medications:   Acetaminophen  (Percocet)  Amlodipine   Aspirin   Atorvastatin  (Lipitor)   Hydrochlorothiazide   Linaclotide  (Linzess )  Naloxone  (Narcan )  Omega-3-Acid  Ethyl Esters (Lovaza )  Semaglutide   Sildenafil  (Viagra )  Tamsulosin  (Flomax )  Tizanidine  (Zanaflex )  Valsartan  ==================================================================== For clinical consultation, please call [phone]. ====================================================================   POCT glycosylated hemoglobin (Hb A1C)     Status: None   Collection Time: 09/28/23  8:42 AM  Result Value Ref Range   Hemoglobin A1C 5.2 4.0 - 5.6 %   HbA1c POC (<> result, manual entry)     HbA1c, POC (prediabetic range)     HbA1c, POC (controlled diabetic range)      Diabetic Foot Exam:  Diabetic foot exam was performed with the following findings:   No deformities, ulcerations, or other skin breakdown Normal sensation of 10g monofilament Intact posterior tibialis and dorsalis pedis pulses      PHQ2/9:    09/28/2023    8:28 AM 08/05/2023    8:10 AM 07/27/2023    7:57 AM 05/06/2023    7:58 AM 05/04/2023    7:54 AM  Depression screen PHQ 2/9  Decreased Interest 0 0 0 0 0  Down, Depressed, Hopeless 0 0 0  0  PHQ - 2 Score 0 0 0 0 0  Altered sleeping 0 0   0  Tired, decreased energy 0 0   0  Change in appetite 0 0   0  Feeling bad or failure about yourself  0 0   0  Trouble concentrating 0 0   0  Moving slowly or fidgety/restless 0 0   0  Suicidal thoughts 0 0   0  PHQ-9 Score 0 0   0  Difficult doing work/chores Not difficult at all Not difficult at all   Not difficult at all    phq 9 is negative  Fall Risk:    09/28/2023    8:27 AM 08/05/2023    8:14 AM 07/27/2023    7:57 AM 05/06/2023    7:58 AM 05/04/2023    7:49 AM  Fall Risk   Falls in the past year? 0 0 0 0 0  Number falls in past yr: 0 0   0  Injury with Fall? 0 0   0  Risk for fall due to : No Fall Risks No Fall Risks   No Fall Risks  Follow up Falls prevention discussed;Education provided;Falls evaluation completed Falls  prevention discussed;Falls evaluation completed   Falls prevention discussed;Education provided;Falls evaluation completed     Assessment & Plan  1. Dyslipidemia associated with type 2 diabetes mellitus (HCC) (Primary)  - POCT glycosylated hemoglobin (Hb A1C)  2. Continuous opioid dependence (HCC)  Under the care of Dr. Marcelino  3. Hypertension associated with diabetes (HCC)  Well controlled, continue medications  4. BPH associated with nocturia  Stable  5. Achilles tendinitis of left lower extremity  - celecoxib (  CELEBREX) 100 MG capsule; Take 1 capsule (100 mg total) by mouth 2 (two) times daily.  Dispense: 60 capsule; Refill: 0  6. Chronic pain syndrome  stable

## 2023-10-04 DIAGNOSIS — K080 Exfoliation of teeth due to systemic causes: Secondary | ICD-10-CM | POA: Diagnosis not present

## 2023-10-12 ENCOUNTER — Other Ambulatory Visit: Payer: Self-pay | Admitting: Family Medicine

## 2023-10-12 DIAGNOSIS — E1169 Type 2 diabetes mellitus with other specified complication: Secondary | ICD-10-CM

## 2023-10-15 ENCOUNTER — Other Ambulatory Visit: Payer: Self-pay | Admitting: Family Medicine

## 2023-10-15 DIAGNOSIS — N401 Enlarged prostate with lower urinary tract symptoms: Secondary | ICD-10-CM

## 2023-10-20 ENCOUNTER — Ambulatory Visit: Admitting: Podiatry

## 2023-10-20 ENCOUNTER — Ambulatory Visit (INDEPENDENT_AMBULATORY_CARE_PROVIDER_SITE_OTHER)

## 2023-10-20 DIAGNOSIS — M7662 Achilles tendinitis, left leg: Secondary | ICD-10-CM | POA: Diagnosis not present

## 2023-10-20 DIAGNOSIS — M7752 Other enthesopathy of left foot: Secondary | ICD-10-CM | POA: Diagnosis not present

## 2023-10-20 DIAGNOSIS — M19072 Primary osteoarthritis, left ankle and foot: Secondary | ICD-10-CM

## 2023-10-20 MED ORDER — METHYLPREDNISOLONE 4 MG PO TBPK: ORAL_TABLET | ORAL | 0 refills | Status: DC

## 2023-10-20 MED ORDER — MELOXICAM 15 MG PO TABS: 15.0000 mg | ORAL_TABLET | Freq: Every day | ORAL | 3 refills | Status: DC

## 2023-10-20 NOTE — Progress Notes (Signed)
 Subjective:  Patient ID: Lawrence Thornton., male    DOB: June 12, 1955,  MRN: [id number] HPI Chief Complaint  Patient presents with   Ankle Pain    np left ankle pain    68 y.o. male presents with the above complaint.   ROS: Denies fever chills nausea muscle aches pains calf pain back pain chest pain shortness of breath.  Past Medical History:  Diagnosis Date   Arthritis    oa   Hypertension    Past Surgical History:  Procedure Laterality Date   APPENDECTOMY  age 21   arthroscopic shoulder surgery Right 08/31/2017   BACK SURGERY  lower back surgery x 3    L 3 L 4 and L 5 are fused together   COLONOSCOPY WITH PROPOFOL  N/A 05/04/2017   Procedure: COLONOSCOPY WITH PROPOFOL ;  Surgeon: Jinny Carmine, MD;  Location: [facility] ENDOSCOPY;  Service: Endoscopy;  Laterality: N/A;   COLONOSCOPY WITH PROPOFOL  N/A 06/30/2022   Procedure: COLONOSCOPY WITH PROPOFOL ;  Surgeon: Jinny Carmine, MD;  Location: [facility] ENDOSCOPY;  Service: Endoscopy;  Laterality: N/A;   LUMBAR FUSION  1999   L- to L5   metal removed from eye  years ago   sebaceous cyst removed Right 15 years ago   middle  finger    TOE FUSION Right    4th toe   TOTAL KNEE ARTHROPLASTY Left 08/10/2014   Procedure: LEFT TOTAL KNEE ARTHROPLASTY;  Surgeon: Lamar Collet, MD;  Location: [facility];  Service: Orthopedics;  Laterality: Left;   TOTAL KNEE ARTHROPLASTY Right 12/21/2014   Procedure: RIGHT TOTAL KNEE ARTHROPLASTY;  Surgeon: Lamar Collet, MD;  Location: [facility];  Service: Orthopedics;  Laterality: Right;   TOTAL KNEE ARTHROPLASTY Right 12/21/14    Current Outpatient Medications:    meloxicam  (MOBIC ) 15 MG tablet, Take 1 tablet (15 mg total) by mouth daily., Disp: 30 tablet, Rfl: 3   methylPREDNISolone  (MEDROL  DOSEPAK) 4 MG TBPK tablet, 6 day dose pack - take as directed, Disp: 21 tablet, Rfl: 0   amLODIPine -Valsartan -HCTZ 10-320-25 MG TABS, One tablet by mouth once daily, Disp: 90 tablet, Rfl: 0   aspirin  EC 81 MG tablet, Take 1  tablet (81 mg total) by mouth daily., Disp: 30 tablet, Rfl: 0   atorvastatin  (LIPITOR) 20 MG tablet, TAKE 1 TABLET BY MOUTH EVERY DAY, Disp: 90 tablet, Rfl: 1   celecoxib  (CELEBREX ) 100 MG capsule, Take 1 capsule (100 mg total) by mouth 2 (two) times daily., Disp: 60 capsule, Rfl: 0   linaclotide  (LINZESS ) 72 MCG capsule, Take 1 capsule (72 mcg total) by mouth daily before breakfast., Disp: 90 capsule, Rfl: 1   naloxone  (NARCAN ) 2 MG/2ML injection, Inject 1 mL (1 mg total) into the muscle as needed for up to 2 doses (for opioid overdose). Inject content of syringe into thigh muscle. Call 911., Disp: 2 mL, Rfl: 1   omega-3 acid ethyl esters (LOVAZA ) 1 g capsule, Take 2 capsules (2 g total) by mouth 2 (two) times daily., Disp: 360 capsule, Rfl: 1   oxyCODONE  (OXYCONTIN ) 20 mg 12 hr tablet, Take 1 tablet (20 mg total) by mouth every 12 (twelve) hours. Months last 30 days., Disp: 60 tablet, Rfl: 0   oxyCODONE -acetaminophen  (PERCOCET) 10-325 MG tablet, Take 1 tablet by mouth 2 (two) times daily as needed for pain., Disp: 60 tablet, Rfl: 0   oxyCODONE -acetaminophen  (PERCOCET) 10-325 MG tablet, Take 1 tablet by mouth 2 (two) times daily as needed for pain., Disp: 60 tablet, Rfl: 0   oxyCODONE -acetaminophen  (PERCOCET)  10-325 MG tablet, Take 1 tablet by mouth 2 (two) times daily as needed for pain., Disp: 60 tablet, Rfl: 0   Semaglutide , 2 MG/DOSE, 8 MG/3ML SOPN, Inject 2 mg as directed once a week., Disp: 9 mL, Rfl: 1   sildenafil  (VIAGRA ) 100 MG tablet, Take 1 tablet (100 mg total) by mouth daily as needed for erectile dysfunction., Disp: 30 tablet, Rfl: 0   tamsulosin  (FLOMAX ) 0.4 MG CAPS capsule, TAKE 1 CAPSULE BY MOUTH ONCE A DAY 30 MINS AFTER LARGEST MEAL, Disp: 90 capsule, Rfl: 1   tiZANidine  (ZANAFLEX ) 4 MG tablet, Take 1 tablet (4 mg total) by mouth 2 (two) times daily as needed for muscle spasms., Disp: 60 tablet, Rfl: 5  No Known Allergies Review of Systems Objective:  There were no vitals filed  for this visit.  General: Well developed, nourished, in no acute distress, alert and oriented x3   Dermatological: Skin is warm, dry and supple bilateral. Nails x 10 are well maintained; remaining integument appears unremarkable at this time. There are no open sores, no preulcerative lesions, no rash or signs of infection present.  Vascular: Dorsalis Pedis artery and Posterior Tibial artery pedal pulses are 2/4 bilateral with immedate capillary fill time. Pedal hair growth present. No varicosities and no lower extremity edema present bilateral.   Neruologic: Grossly intact via light touch bilateral. Vibratory intact via tuning fork bilateral. Protective threshold with Semmes Wienstein monofilament intact to all pedal sites bilateral. Patellar and Achilles deep tendon reflexes 2+ bilateral. No Babinski or clonus noted bilateral.   Musculoskeletal: No gross boney pedal deformities bilateral. No pain, crepitus, or limitation noted with foot and ankle range of motion bilateral. Muscular strength 5/5 in all groups tested bilateral.  Pain on palpation of the posterior inferior aspect of the Achilles left.  There is an area of fluctuance on the posterior calcaneus most likely a Achilles bursitis.  There is no pain to medial lateral compression of the calcaneus.  Gait: Unassisted, Nonantalgic.    Radiographs:  Radiographs taken today demonstrate retrocalcaneal heel spurs soft tissue increase in thickening of the Achilles at its insertion site.  Assessment & Plan:   Assessment: Achilles tendinitis with bursitis left.  Plan: We injected the bursa today with dexamethasone  and local anesthetic a total of 2 mg was injected directly into the bursa.  Started him on methylprednisolone  to be followed by meloxicam .  Also placed him in a night splint and discussed appropriate shoe gear as well as ice therapy.  Follow-up with him in 1 month     Ramzey Petrovic T. [place], [place]

## 2023-10-21 DIAGNOSIS — M7662 Achilles tendinitis, left leg: Secondary | ICD-10-CM | POA: Diagnosis not present

## 2023-10-21 MED ORDER — DEXAMETHASONE SODIUM PHOSPHATE 120 MG/30ML IJ SOLN
2.0000 mg | Freq: Once | INTRAMUSCULAR | Status: AC
  Administered 2023-10-21: 2 mg via INTRA_ARTICULAR

## 2023-10-21 NOTE — Addendum Note (Signed)
 Addended by: ELAYNE ROSINA BRAVO on: 10/21/2023 07:53 PM   Modules accepted: Orders, Level of Service

## 2023-10-25 ENCOUNTER — Other Ambulatory Visit: Payer: Self-pay | Admitting: Family Medicine

## 2023-10-25 DIAGNOSIS — M7662 Achilles tendinitis, left leg: Secondary | ICD-10-CM

## 2023-10-26 ENCOUNTER — Ambulatory Visit: Attending: Nurse Practitioner | Admitting: Nurse Practitioner

## 2023-10-26 ENCOUNTER — Encounter: Payer: Self-pay | Admitting: Nurse Practitioner

## 2023-10-26 VITALS — BP 156/70 | HR 73 | Temp 99.5°F | Ht 72.0 in | Wt 236.0 lb

## 2023-10-26 DIAGNOSIS — G894 Chronic pain syndrome: Secondary | ICD-10-CM | POA: Diagnosis not present

## 2023-10-26 DIAGNOSIS — M47816 Spondylosis without myelopathy or radiculopathy, lumbar region: Secondary | ICD-10-CM | POA: Insufficient documentation

## 2023-10-26 DIAGNOSIS — Z79891 Long term (current) use of opiate analgesic: Secondary | ICD-10-CM | POA: Diagnosis not present

## 2023-10-26 DIAGNOSIS — Z79899 Other long term (current) drug therapy: Secondary | ICD-10-CM | POA: Insufficient documentation

## 2023-10-26 MED ORDER — OXYCODONE HCL ER 20 MG PO T12A: 20.0000 mg | EXTENDED_RELEASE_TABLET | Freq: Two times a day (BID) | ORAL | 0 refills | Status: DC

## 2023-10-26 MED ORDER — OXYCODONE-ACETAMINOPHEN 10-325 MG PO TABS: 1.0000 | ORAL_TABLET | Freq: Two times a day (BID) | ORAL | 0 refills | Status: DC | PRN

## 2023-10-26 MED ORDER — OXYCODONE HCL ER 20 MG PO T12A: 20.0000 mg | EXTENDED_RELEASE_TABLET | Freq: Two times a day (BID) | ORAL | 0 refills | Status: AC

## 2023-10-26 MED ORDER — TIZANIDINE HCL 4 MG PO TABS: 4.0000 mg | ORAL_TABLET | Freq: Two times a day (BID) | ORAL | 5 refills | Status: AC | PRN

## 2023-10-26 NOTE — Progress Notes (Signed)
 Nursing Pain Medication Assessment:  Safety precautions to be maintained throughout the outpatient stay will include: orient to surroundings, keep bed in low position, maintain call bell within reach at all times, provide assistance with transfer out of bed and ambulation.  Medication Inspection Compliance: Pill count conducted under aseptic conditions, in front of the patient. Neither the pills nor the bottle was removed from the patient's sight at any time. Once count was completed pills were immediately returned to the patient in their original bottle.  Medication #1: Oxycodone /APAP Pill/Patch Count: 53 of 60 pills/patches remain Pill/Patch Appearance: Markings consistent with prescribed medication Bottle Appearance: Standard pharmacy container. Clearly labeled. Filled Date: 7 / 36 / 2025 Last Medication intake:  Today  Medication #2: Oxycodone  ER (OxyContin ) Pill/Patch Count: 53 of 60 pills/patches remain Pill/Patch Appearance: Markings consistent with prescribed medication Bottle Appearance: Standard pharmacy container. Clearly labeled. Filled Date: 7 / 7 / 2025 Last Medication intake:  TodaySafety precautions to be maintained throughout the outpatient stay will include: orient to surroundings, keep bed in low position, maintain call bell within reach at all times, provide assistance with transfer out of bed and ambulation.

## 2023-10-26 NOTE — Progress Notes (Signed)
 PROVIDER NOTE: Interpretation of information contained herein should be left to medically-trained personnel. Specific patient instructions are provided elsewhere under Patient Instructions section of medical record. This document was created in part using AI and STT-dictation technology, any transcriptional errors that may result from this process are unintentional.  Patient: Lawrence Thornton.  Service: E/M   PCP: Sowles, Krichna, MD  DOB: 10/30/1955  DOS: 10/26/2023  Provider: Emmy MARLA Blanch, NP  MRN: [id number]  Delivery: Face-to-face  Specialty: Interventional Pain Management  Type: Established Patient  Setting: Ambulatory outpatient facility  Specialty designation: 09  Referring Prov.: Glenard Mire, MD  Location: Outpatient office facility       History of present illness (HPI) [name]., a 68 y.o. year old male, is here today because of his Chronic pain syndrome [G89.4]. Lawrence Thornton primary complain today is Back Pain (right)  Pertinent problems: Lawrence Thornton has Chronic right-sided low back pain without sciatica; Continues opioid dependence (HCC); S/P lumbar fusion; S/P knee replacement; Osteoarthritis of acromioclavicular joint; Long-term current use of opiate analgesics; Lumbar spondylosis; Chronic pain syndrome; and Chronic use of opiate for therapeutic purpose on their pertinent problem list.  Pain Assessment: Severity of Chronic pain is reported as a 4 /10. Location: Back Right/Denies. Onset: More than a month ago. Quality: Aching, Burning, Constant. Timing: Constant. Modifying factor(s): meds, walking, ice. Vitals:  height is 6' (1.829 m) and weight is 236 lb (107 kg). His temperature is 99.5 F (37.5 C). His blood pressure is 156/70 (abnormal) and his pulse is 73. His oxygen saturation is 100%.  BMI: Estimated body mass index is 32.01 kg/m as calculated from the following:   Height as of this encounter: 6' (1.829 m).   Weight as of this encounter: 236 lb  (107 kg).  Last encounter: 07/27/2023 Last procedure: Visit date not found.  Reason for encounter: medication management. No change in medical history since last visit.  Patient's pain is at baseline.  Patient continues multimodal pain regimen as prescribed.  States that it provides pain relief and improvement in functional status. Pharmacotherapy Assessment   Oxycodone -acetaminophen  (Percocet) 10-325 mg 2 times daily as needed for pain. MME=30 OxyContin  ER 20 Mg every 12 hours as needed for chronic pain. MME=60 Tizanidine  (Zanaflex ) 4 mg 2 times daily as needed for muscle spasm Monitoring: [place] PMP: PDMP reviewed during this encounter.       Pharmacotherapy: No side-effects or adverse reactions reported. Compliance: No problems identified. Effectiveness: Clinically acceptable.  Delores Dorothe LABOR, RN  10/26/2023  8:29 AM  Sign when Signing Visit Nursing Pain Medication Assessment:  Safety precautions to be maintained throughout the outpatient stay will include: orient to surroundings, keep bed in low position, maintain call bell within reach at all times, provide assistance with transfer out of bed and ambulation.  Medication Inspection Compliance: Pill count conducted under aseptic conditions, in front of the patient. Neither the pills nor the bottle was removed from the patient's sight at any time. Once count was completed pills were immediately returned to the patient in their original bottle.  Medication #1: Oxycodone /APAP Pill/Patch Count: 53 of 60 pills/patches remain Pill/Patch Appearance: Markings consistent with prescribed medication Bottle Appearance: Standard pharmacy container. Clearly labeled. Filled Date: 7 / 49 / 2025 Last Medication intake:  Today  Medication #2: Oxycodone  ER (OxyContin ) Pill/Patch Count: 53 of 60 pills/patches remain Pill/Patch Appearance: Markings consistent with prescribed medication Bottle Appearance: Standard pharmacy container. Clearly labeled. Filled  Date: 7 / 56 / 2025  Last Medication intake:  TodaySafety precautions to be maintained throughout the outpatient stay will include: orient to surroundings, keep bed in low position, maintain call bell within reach at all times, provide assistance with transfer out of bed and ambulation.     UDS:  Summary  Date Value Ref Range Status  07/27/2023 FINAL  Final    Comment:    ==================================================================== [facility] (MW) ==================================================================== Test                             Result       Flag       Units  Drug Present and Declared for Prescription Verification   Oxycodone                       4612         EXPECTED   ng/mg creat   Oxymorphone                    2721         EXPECTED   ng/mg creat   Noroxycodone                   5425         EXPECTED   ng/mg creat   Noroxymorphone                 837          EXPECTED   ng/mg creat    Sources of oxycodone  are scheduled prescription medications.    Oxymorphone, noroxycodone, and noroxymorphone are expected    metabolites of oxycodone . Oxymorphone is also available as a    scheduled prescription medication.  ==================================================================== Test                      Result    Flag   Units      Ref Range   Creatinine              91               mg/dL      >=79 ==================================================================== Declared Medications:  The flagging and interpretation on this report are based on the  following declared medications.  Unexpected results may arise from  inaccuracies in the declared medications.   **Note: The testing scope of this panel includes these medications:   Oxycodone  (Oxycontin )  Oxycodone  (Percocet)   **Note: The testing scope of this panel does not include the  following reported medications:   Acetaminophen  (Percocet)  Amlodipine   Aspirin   Atorvastatin   (Lipitor)  Hydrochlorothiazide   Linaclotide  (Linzess )  Naloxone  (Narcan )  Omega-3-Acid  Ethyl Esters (Lovaza )  Semaglutide   Sildenafil  (Viagra )  Tamsulosin  (Flomax )  Tizanidine  (Zanaflex )  Valsartan  ==================================================================== For clinical consultation, please call [phone]. ====================================================================     No results found for: CBDTHCR No results found for: D8THCCBX No results found for: D9THCCBX  ROS  Constitutional: Denies any fever or chills Gastrointestinal: No reported hemesis, hematochezia, vomiting, or acute GI distress Musculoskeletal: Back pain (R>L) Neurological: No reported episodes of acute onset apraxia, aphasia, dysarthria, agnosia, amnesia, paralysis, loss of coordination, or loss of consciousness  Medication Review  Semaglutide  (2 MG/DOSE), amLODIPine -Valsartan -HCTZ, aspirin  EC, atorvastatin , celecoxib , linaclotide , meloxicam , methylPREDNISolone , naloxone , omega-3 acid ethyl esters, oxyCODONE , oxyCODONE -acetaminophen , sildenafil , tamsulosin , and tiZANidine   History Review  Allergy: Lawrence Thornton has no known allergies. Drug: Lawrence Thornton  reports no history  of drug use. Alcohol :  reports no history of alcohol  use. Tobacco:  reports that he quit smoking about 24 years ago. His smoking use included cigarettes. He started smoking about 44 years ago. He has a 29.9 pack-year smoking history. He has never used smokeless tobacco. Social: Lawrence Thornton  reports that he quit smoking about 24 years ago. His smoking use included cigarettes. He started smoking about 44 years ago. He has a 29.9 pack-year smoking history. He has never used smokeless tobacco. He reports that he does not drink alcohol  and does not use drugs. Medical:  has a past medical history of Arthritis and Hypertension. Surgical: Lawrence Thornton  has a past surgical history that includes Back surgery (lower back surgery x  3 ); Appendectomy (age 72); sebaceous cyst removed (Right, 15 years ago); Toe Fusion (Right); metal removed from eye (years ago); Total knee arthroplasty (Left, 08/10/2014); Total knee arthroplasty (Right, 12/21/2014); Total knee arthroplasty (Right, 12/21/14); Lumbar fusion (1999); Colonoscopy with propofol  (N/A, 05/04/2017); arthroscopic shoulder surgery (Right, 08/31/2017); and Colonoscopy with propofol  (N/A, 06/30/2022). Family: family history includes Cancer in his mother; Heart attack in his mother; Hyperlipidemia in his father; Hypertension in his father.  Laboratory Chemistry Profile   Renal Lab Results  Component Value Date   BUN 12 05/04/2023   CREATININE 0.81 05/04/2023   LABCREA 278 05/04/2023   BCR SEE NOTE: 05/04/2023   GFRAA 108 05/03/2020   GFRNONAA 93 05/03/2020    Hepatic Lab Results  Component Value Date   AST 15 05/04/2023   ALT 18 05/04/2023   ALBUMIN 4.6 11/11/2015   ALKPHOS 76 11/11/2015   HCVAB NEGATIVE 10/06/2016    Electrolytes Lab Results  Component Value Date   NA 139 05/04/2023   K 4.5 05/04/2023   CL 101 05/04/2023   CALCIUM  10.0 05/04/2023    Bone Lab Results  Component Value Date   VD25OH 43 10/06/2016    Inflammation (CRP: Acute Phase) (ESR: Chronic Phase) No results found for: CRP, ESRSEDRATE, LATICACIDVEN       Note: Above Lab results reviewed.  Recent Imaging Review  DG Ankle Complete Left Please see detailed radiograph report in office note. Note: Reviewed        Physical Exam  Vitals: BP (!) 156/70   Pulse 73   Temp 99.5 F (37.5 C)   Ht 6' (1.829 m)   Wt 236 lb (107 kg)   SpO2 100%   BMI 32.01 kg/m  BMI: Estimated body mass index is 32.01 kg/m as calculated from the following:   Height as of this encounter: 6' (1.829 m).   Weight as of this encounter: 236 lb (107 kg). Ideal: Ideal body weight: 77.6 kg (171 lb 1.2 oz) Adjusted ideal body weight: 89.4 kg (197 lb 0.7 oz) General appearance: Well nourished, well  developed, and well hydrated. In no apparent acute distress Mental status: Alert, oriented x 3 (person, place, & time)       Respiratory: No evidence of acute respiratory distress Eyes: PERLA   Assessment   Diagnosis Status  1. Chronic pain syndrome   2. Chronic use of opiate for therapeutic purpose   3. Long term prescription opiate use   4. Encounter for long-term opiate analgesic use   5. Medication management   6. Lumbar spondylosis    Controlled Controlled Controlled   Updated Problems: Problem  Long Term Prescription Opiate Use    Plan of Care  Problem-specific:  Assessment and Plan Will continue on current medication regimen.  Prescribing drug monitoring (PDMP) reviewed; findings consistent with the use of prescribed medication and no evidence of narcotic misuse or abuse.  Urine drug screen (UDS) up-to-date.  No other new issues or problems reported to this visit.  Schedule follow-up in 90 days for medication management.   Lawrence Thornton. has a current medication list which includes the following long-term medication(s): amlodipine -valsartan -hctz, atorvastatin , linaclotide , omega-3 acid ethyl esters, sildenafil , [START ON 11/16/2023] oxycodone -acetaminophen , [START ON 12/16/2023] oxycodone -acetaminophen , and [START ON 01/15/2024] oxycodone -acetaminophen .  Pharmacotherapy (Medications Ordered): Meds ordered this encounter  Medications   oxyCODONE -acetaminophen  (PERCOCET) 10-325 MG tablet    Sig: Take 1 tablet by mouth 2 (two) times daily as needed for pain.    Dispense:  60 tablet    Refill:  0   oxyCODONE -acetaminophen  (PERCOCET) 10-325 MG tablet    Sig: Take 1 tablet by mouth 2 (two) times daily as needed for pain.    Dispense:  60 tablet    Refill:  0   oxyCODONE -acetaminophen  (PERCOCET) 10-325 MG tablet    Sig: Take 1 tablet by mouth 2 (two) times daily as needed for pain.    Dispense:  60 tablet    Refill:  0   oxyCODONE  (OXYCONTIN ) 20 mg 12 hr  tablet    Sig: Take 1 tablet (20 mg total) by mouth every 12 (twelve) hours. Months last 30 days.    Dispense:  60 tablet    Refill:  0    Chronic Pain: STOP Act (Not applicable) Fill 1 day early if closed on refill date. Avoid benzodiazepines within 8 hours of opioids   oxyCODONE  (OXYCONTIN ) 20 mg 12 hr tablet    Sig: Take 1 tablet (20 mg total) by mouth every 12 (twelve) hours. Months last 30 days.    Dispense:  60 tablet    Refill:  0    Chronic Pain: STOP Act (Not applicable) Fill 1 day early if closed on refill date. Avoid benzodiazepines within 8 hours of opioids   oxyCODONE  (OXYCONTIN ) 20 mg 12 hr tablet    Sig: Take 1 tablet (20 mg total) by mouth every 12 (twelve) hours. Months last 30 days.    Dispense:  60 tablet    Refill:  0    Chronic Pain: STOP Act (Not applicable) Fill 1 day early if closed on refill date. Avoid benzodiazepines within 8 hours of opioids   tiZANidine  (ZANAFLEX ) 4 MG tablet    Sig: Take 1 tablet (4 mg total) by mouth 2 (two) times daily as needed for muscle spasms.    Dispense:  60 tablet    Refill:  5   Orders:  No orders of the defined types were placed in this encounter.       Return in about 3 months (around 01/26/2024) for (F2F), (MM), Emmy Blanch NP.    Recent Visits No visits were found meeting these conditions. Showing recent visits within past 90 days and meeting all other requirements Today's Visits Date Type Provider Dept  10/26/23 Office Visit Tylee Newby K, NP [facility]  Showing today's visits and meeting all other requirements Future Appointments Date Type Provider Dept  01/19/24 Appointment Darilyn Storbeck K, NP [facility]  Showing future appointments within next 90 days and meeting all other requirements  I discussed the assessment and treatment plan with the patient. The patient was provided an opportunity to ask questions and all were answered. The patient agreed with the plan and demonstrated an  understanding of the  instructions.  Patient advised to call back or seek an in-person evaluation if the symptoms or condition worsens.  Duration of encounter: 30 minutes.  Total time on encounter, as per AMA guidelines included both the face-to-face and non-face-to-face time personally spent by the physician and/or other qualified health care professional(s) on the day of the encounter (includes time in activities that require the physician or other qualified health care professional and does not include time in activities normally performed by clinical staff). Physician's time may include the following activities when performed: Preparing to see the patient (e.g., pre-charting review of records, searching for previously ordered imaging, lab work, and nerve conduction tests) Review of prior analgesic pharmacotherapies. Reviewing PMP Interpreting ordered tests (e.g., lab work, imaging, nerve conduction tests) Performing post-procedure evaluations, including interpretation of diagnostic procedures Obtaining and/or reviewing separately obtained history Performing a medically appropriate examination and/or evaluation Counseling and educating the patient/family/caregiver Ordering medications, tests, or procedures Referring and communicating with other health care professionals (when not separately reported) Documenting clinical information in the electronic or other health record Independently interpreting results (not separately reported) and communicating results to the patient/ family/caregiver Care coordination (not separately reported)  Note by: Lauralye Kinn K Shon Indelicato, NP (TTS and AI technology used. I apologize for any typographical errors that were not detected and corrected.) Date: 10/26/2023; Time: 9:03 AM

## 2023-11-03 DIAGNOSIS — K080 Exfoliation of teeth due to systemic causes: Secondary | ICD-10-CM | POA: Diagnosis not present

## 2023-11-24 ENCOUNTER — Ambulatory Visit: Admitting: Podiatry

## 2023-12-27 ENCOUNTER — Ambulatory Visit (INDEPENDENT_AMBULATORY_CARE_PROVIDER_SITE_OTHER): Admitting: Podiatry

## 2023-12-27 DIAGNOSIS — M7662 Achilles tendinitis, left leg: Secondary | ICD-10-CM | POA: Diagnosis not present

## 2023-12-27 MED ORDER — CELECOXIB 200 MG PO CAPS
200.0000 mg | ORAL_CAPSULE | Freq: Two times a day (BID) | ORAL | Status: AC
Start: 2023-12-27 — End: ?

## 2023-12-27 MED ORDER — TRIAMCINOLONE ACETONIDE 40 MG/ML IJ SUSP
20.0000 mg | Freq: Once | INTRAMUSCULAR | Status: AC
  Administered 2023-12-27: 20 mg

## 2023-12-27 NOTE — Progress Notes (Signed)
 He presents today for follow-up of his Achilles tinnitus states he was doing good for a while then all of a sudden he got worse again.  Hurts for the first step in the morning and then feels a little better but hurts with pressure.  Objective: Vital signs are stable alert oriented x 3.  Pulses are palpable.  He has pain on palpation with fluctuance on palpation of the Achilles tendon at his superior insertion site on the calcaneus.  Assessment: Insertional Achilles tendinitis and bursitis.  Plan: We discussed injecting in 1 more time with betamethasone.  He would like to try this and he will start his Celebrex  200 mg twice daily.  States that he does not have time now for an MRI and for surgery because he is taking care of his sick wife.

## 2023-12-28 NOTE — Progress Notes (Signed)
 Lawrence Thornton.                                          MRN: [id number]   12/28/2023   The [facility] Quality Team Specialist reviewed this patient medical record for the purposes of chart review for care gap closure. The following were reviewed: chart review for care gap closure-controlling blood pressure.    [facility] Quality Team

## 2024-01-04 ENCOUNTER — Other Ambulatory Visit: Payer: Self-pay

## 2024-01-04 ENCOUNTER — Emergency Department
Admission: EM | Admit: 2024-01-04 | Discharge: 2024-01-04 | Disposition: A | Discharge status: Home / Self Care | Attending: Emergency Medicine | Admitting: Emergency Medicine

## 2024-01-04 ENCOUNTER — Emergency Department

## 2024-01-04 DIAGNOSIS — S3992XA Unspecified injury of lower back, initial encounter: Secondary | ICD-10-CM | POA: Diagnosis not present

## 2024-01-04 DIAGNOSIS — M2578 Osteophyte, vertebrae: Secondary | ICD-10-CM | POA: Diagnosis not present

## 2024-01-04 DIAGNOSIS — S39012A Strain of muscle, fascia and tendon of lower back, initial encounter: Secondary | ICD-10-CM | POA: Insufficient documentation

## 2024-01-04 DIAGNOSIS — I700 Atherosclerosis of aorta: Secondary | ICD-10-CM | POA: Diagnosis not present

## 2024-01-04 DIAGNOSIS — X500XXA Overexertion from strenuous movement or load, initial encounter: Secondary | ICD-10-CM | POA: Insufficient documentation

## 2024-01-04 DIAGNOSIS — Z981 Arthrodesis status: Secondary | ICD-10-CM | POA: Diagnosis not present

## 2024-01-04 DIAGNOSIS — M545 Low back pain, unspecified: Secondary | ICD-10-CM | POA: Diagnosis not present

## 2024-01-04 MED ORDER — METHOCARBAMOL 500 MG PO TABS
1000.0000 mg | ORAL_TABLET | Freq: Once | ORAL | Status: AC
  Administered 2024-01-04: 1000 mg via ORAL
  Filled 2024-01-04: qty 2

## 2024-01-04 MED ORDER — LIDOCAINE 5 % EX PTCH: 1.0000 | MEDICATED_PATCH | CUTANEOUS | 0 refills | Status: AC

## 2024-01-04 MED ORDER — LIDOCAINE 5 % EX PTCH
1.0000 | MEDICATED_PATCH | CUTANEOUS | Status: DC
  Administered 2024-01-04: 1 via TRANSDERMAL
  Filled 2024-01-04: qty 1

## 2024-01-04 MED ORDER — NAPROXEN 500 MG PO TABS: 500.0000 mg | ORAL_TABLET | Freq: Two times a day (BID) | ORAL | 0 refills | Status: DC

## 2024-01-04 MED ORDER — KETOROLAC TROMETHAMINE 15 MG/ML IJ SOLN
15.0000 mg | Freq: Once | INTRAMUSCULAR | Status: AC
Start: 2024-01-04 — End: 2024-01-04
  Administered 2024-01-04: 15 mg via INTRAMUSCULAR
  Filled 2024-01-04: qty 1

## 2024-01-04 MED ORDER — METHOCARBAMOL 500 MG PO TABS: 500.0000 mg | ORAL_TABLET | Freq: Four times a day (QID) | ORAL | 0 refills | Status: AC

## 2024-01-04 NOTE — ED Triage Notes (Signed)
 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

## 2024-01-04 NOTE — ED Provider Notes (Signed)
 [facility] Provider Note    Event Date/Time   First MD Initiated Contact with Patient 01/04/24 [phone]     (approximate)   History   Back Pain   HPI  Lawrence Feger. is a 68 y.o. male with a history of chronic low back pain after multiple back surgeries, opioid dependence who comes ED complaining of right lower back pain, nonradiating that started after having to lift his wife off the floor 1 week ago.  Denies any incontinence or other bowel or bladder dysfunction.  No lower extremity weakness or paresthesia.  No fever.  Has been putting ice on it.  Has tizanidine  at home that he tried without relief.     Physical Exam   Triage Vital Signs: ED Triage Vitals  Encounter Vitals Group     BP 01/04/24 0348 106/73     Girls Systolic BP Percentile --      Girls Diastolic BP Percentile --      Boys Systolic BP Percentile --      Boys Diastolic BP Percentile --      Pulse Rate 01/04/24 0348 92     Resp 01/04/24 0348 18     Temp 01/04/24 0348 97.7 F (36.5 C)     Temp src --      SpO2 01/04/24 0348 98 %     Weight 01/04/24 0347 220 lb (99.8 kg)     Height 01/04/24 0347 6' (1.829 m)     Head Circumference --      Peak Flow --      Pain Score 01/04/24 0347 10     Pain Loc --      Pain Education --      Exclude from Growth Chart --     Most recent vital signs: Vitals:   01/04/24 0348 01/04/24 0537  BP: 106/73 130/72  Pulse: 92 72  Resp: 18 17  Temp: 97.7 F (36.5 C) 98.3 F (36.8 C)  SpO2: 98% 98%    General: Awake, no distress.  CV:  Good peripheral perfusion.  Normal distal pulses Resp:  Normal effort.  Abd:  No distention.  Other:  No midline spinal tenderness.  Straight leg raise negative.  Reproducible pain in the right lumbar musculature   ED Results / Procedures / Treatments   Labs (all labs ordered are listed, but only abnormal results are displayed) Labs Reviewed - No data to display   RADIOLOGY X-ray lumbar spine  negative   PROCEDURES:  Procedures   MEDICATIONS ORDERED IN ED: Medications  lidocaine  (LIDODERM ) 5 % 1 patch (1 patch Transdermal Patch Applied 01/04/24 0548)  methocarbamol  (ROBAXIN ) tablet 1,000 mg (1,000 mg Oral Given 01/04/24 0547)  ketorolac  (TORADOL ) 15 MG/ML injection 15 mg (15 mg Intramuscular Given 01/04/24 0548)     IMPRESSION / MDM / ASSESSMENT AND PLAN / ED COURSE  I reviewed the triage vital signs and the nursing notes.                              Differential diagnosis includes, but is not limited to, lumbar strain, doubt herniated disc, cauda equina, infection       FINAL CLINICAL IMPRESSION(S) / ED DIAGNOSES   Final diagnoses:  Strain of lumbar region, initial encounter     Rx / DC Orders   ED Discharge Orders          Ordered    naproxen (  NAPROSYN) 500 MG tablet  2 times daily with meals        01/04/24 0625    lidocaine  (LIDODERM ) 5 %  Every 24 hours        01/04/24 0625    methocarbamol  (ROBAXIN ) 500 MG tablet  4 times daily        01/04/24 9374             Note:  This document was prepared using Dragon voice recognition software and may include unintentional dictation errors.   Viviann Pastor, MD 01/04/24 [phone]

## 2024-01-16 ENCOUNTER — Other Ambulatory Visit: Payer: Self-pay | Admitting: Family Medicine

## 2024-01-16 DIAGNOSIS — I152 Hypertension secondary to endocrine disorders: Secondary | ICD-10-CM

## 2024-01-19 ENCOUNTER — Encounter: Payer: Self-pay | Admitting: Nurse Practitioner

## 2024-01-19 ENCOUNTER — Ambulatory Visit: Attending: Nurse Practitioner | Admitting: Nurse Practitioner

## 2024-01-19 DIAGNOSIS — Z79891 Long term (current) use of opiate analgesic: Secondary | ICD-10-CM | POA: Diagnosis not present

## 2024-01-19 DIAGNOSIS — Z79899 Other long term (current) drug therapy: Secondary | ICD-10-CM | POA: Insufficient documentation

## 2024-01-19 DIAGNOSIS — G894 Chronic pain syndrome: Secondary | ICD-10-CM | POA: Insufficient documentation

## 2024-01-19 MED ORDER — OXYCODONE-ACETAMINOPHEN 10-325 MG PO TABS: 1.0000 | ORAL_TABLET | Freq: Two times a day (BID) | ORAL | 0 refills | Status: DC | PRN

## 2024-01-19 MED ORDER — OXYCODONE HCL ER 20 MG PO T12A: 20.0000 mg | EXTENDED_RELEASE_TABLET | Freq: Two times a day (BID) | ORAL | 0 refills | Status: AC

## 2024-01-19 MED ORDER — OXYCODONE HCL ER 20 MG PO T12A
20.0000 mg | EXTENDED_RELEASE_TABLET | Freq: Two times a day (BID) | ORAL | 0 refills | Status: AC
Start: 2024-02-25 — End: 2024-03-26

## 2024-01-19 MED ORDER — OXYCODONE HCL ER 20 MG PO T12A: 20.0000 mg | EXTENDED_RELEASE_TABLET | Freq: Two times a day (BID) | ORAL | 0 refills | Status: DC

## 2024-01-19 NOTE — Progress Notes (Signed)
 Nursing Pain Medication Assessment:  Safety precautions to be maintained throughout the outpatient stay will include: orient to surroundings, keep bed in low position, maintain call bell within reach at all times, provide assistance with transfer out of bed and ambulation.  Medication Inspection Compliance: Pill count conducted under aseptic conditions, in front of the patient. Neither the pills nor the bottle was removed from the patient's sight at any time. Once count was completed pills were immediately returned to the patient in their original bottle.  Medication #1: Oxycodone  IR Pill/Patch Count: 16 of 60 pills/patches remain Pill/Patch Appearance: Markings consistent with prescribed medication Bottle Appearance: Standard pharmacy container. Clearly labeled. Filled Date: 09 / 17 / 2025 Last Medication intake:  Yesterday  Medication #2: Oxycodone /APAP Pill/Patch Count: 16 of 60 pills/patches remain Pill/Patch Appearance: Markings consistent with prescribed medication Bottle Appearance: Standard pharmacy container. Clearly labeled. Filled Date: 09 / 17 / 2025 Last Medication intake:  YesterdaySafety precautions to be maintained throughout the outpatient stay will include: orient to surroundings, keep bed in low position, maintain call bell within reach at all times, provide assistance with transfer out of bed and ambulation.

## 2024-01-19 NOTE — Progress Notes (Signed)
 PROVIDER NOTE: Interpretation of information contained herein should be left to medically-trained personnel. Specific patient instructions are provided elsewhere under Patient Instructions section of medical record. This document was created in part using AI and STT-dictation technology, any transcriptional errors that may result from this process are unintentional.  Patient: Lawrence Thornton.  Service: E/M   PCP: Sowles, Krichna, MD  DOB: 05-31-1955  DOS: 01/19/2024  Provider: Emmy MARLA Blanch, NP  MRN: [id number]  Delivery: Face-to-face  Specialty: Interventional Pain Management  Type: Established Patient  Setting: Ambulatory outpatient facility  Specialty designation: 09  Referring Prov.: Glenard Mire, MD  Location: Outpatient office facility       History of present illness (HPI) Mr. Lawrence Ayo., a 68 y.o. year old male, is here today because of his Right lower back pain. Mr. Carrera primary complain today is Back Pain (Right, lower)  Pertinent problems: Mr. Shetley  has Chronic right-sided low back pain without sciatica; Continues opioid dependence (HCC); S/P lumbar fusion; S/P knee replacement; Osteoarthritis of acromioclavicular joint; Long-term current use of opiate analgesics; Lumbar spondylosis; Chronic pain syndrome; and Chronic use of opiate for therapeutic purpose on their pertinent problem list.  Pain Assessment: Severity of Chronic pain is reported as a 4 /10. Location: Back Right, Lower/denies. Onset: More than a month ago. Quality: Dull. Timing: Constant. Modifying factor(s): medications, walking. Vitals:  height is 6' (1.829 m) and weight is 223 lb (101.2 kg). His temporal temperature is 97.2 F (36.2 C) (abnormal). His blood pressure is 133/70 and his pulse is 76. His respiration is 18 and oxygen saturation is 96%.  BMI: Estimated body mass index is 30.24 kg/m as calculated from the following:   Height as of this encounter: 6' (1.829 m).   Weight as of this  encounter: 223 lb (101.2 kg).  Last encounter: 10/26/2023. Last procedure: Visit date not found.  Reason for encounter: medication management. No change in medical history since last visit.  Patient's pain is at baseline.  Patient continues multimodal pain regimen as prescribed.  States that it provides pain relief and improvement in functional status.  The patient continues struggling with low back pain, which is more pronounced on the right side.  He reports that his wife recently passed away on 06-Oct-2025and he is still recovering from emotional distress.  He states that his current pain medication regimen helps manage his daily and functional activities and provide adequate pain relief.  Discussed the use of AI scribe software for clinical note transcription with the patient, who gave verbal consent to proceed.  History of Present Illness   Lawrence Thornton. is a 68 year old male who presents with lower right back pain.  He experiences pain in his lower right back more pronounced on right side.   He is currently taking Percocet and OxyContin  for pain management, with OxyContin  20 mg every twelve hours. No side effects from these medications are reported. He also has tizanidine  available with refills.  His wife recently passed away due to renal failure, and he had been her primary caregiver. This has been a significant emotional and physical burden, impacting his daily life and routines.     Pharmacotherapy Assessment   Oxycodone -acetaminophen  (Percocet) 10-325 mg 2 times daily as needed for pain. MME=30 OxyContin  ER 20 Mg every 12 hours as needed for chronic pain. MME=60 Monitoring: [place] PMP: PDMP reviewed during this encounter.       Pharmacotherapy: No side-effects or adverse reactions reported.  Compliance: No problems identified. Effectiveness: Clinically acceptable.  Lawrence Reda CROME, RN  01/19/2024  8:14 AM  Sign when Signing Visit Nursing Pain Medication Assessment:  Safety  precautions to be maintained throughout the outpatient stay will include: orient to surroundings, keep bed in low position, maintain call bell within reach at all times, provide assistance with transfer out of bed and ambulation.  Medication Inspection Compliance: Pill count conducted under aseptic conditions, in front of the patient. Neither the pills nor the bottle was removed from the patient's sight at any time. Once count was completed pills were immediately returned to the patient in their original bottle.  Medication #1: Oxycodone  IR Pill/Patch Count: 16 of 60 pills/patches remain Pill/Patch Appearance: Markings consistent with prescribed medication Bottle Appearance: Standard pharmacy container. Clearly labeled. Filled Date: 09 / 17 / 2025 Last Medication intake:  Yesterday  Medication #2: Oxycodone /APAP Pill/Patch Count: 16 of 60 pills/patches remain Pill/Patch Appearance: Markings consistent with prescribed medication Bottle Appearance: Standard pharmacy container. Clearly labeled. Filled Date: 09 / 17 / 2025 Last Medication intake:  YesterdaySafety precautions to be maintained throughout the outpatient stay will include: orient to surroundings, keep bed in low position, maintain call bell within reach at all times, provide assistance with transfer out of bed and ambulation.     UDS:  Summary  Date Value Ref Range Status  07/27/2023 FINAL  Final    Comment:    ==================================================================== [employer] [facility] (MW) ==================================================================== Test                             Result       Flag       Units  Drug Present and Declared for Prescription Verification   Oxycodone                       4612         EXPECTED   ng/mg creat   Oxymorphone                    2721         EXPECTED   ng/mg creat   Noroxycodone                   5425         EXPECTED   ng/mg creat   Noroxymorphone                  837          EXPECTED   ng/mg creat    Sources of oxycodone  are scheduled prescription medications.    Oxymorphone, noroxycodone, and noroxymorphone are expected    metabolites of oxycodone . Oxymorphone is also available as a    scheduled prescription medication.  ==================================================================== Test                      Result    Flag   Units      Ref Range   Creatinine              91               mg/dL      >=79 ==================================================================== Declared Medications:  The flagging and interpretation on this report are based on the  following declared medications.  Unexpected results may arise from  inaccuracies in the declared medications.   **Note: The testing scope of this panel  includes these medications:   Oxycodone  (Oxycontin )  Oxycodone  (Percocet)   **Note: The testing scope of this panel does not include the  following reported medications:   Acetaminophen  (Percocet)  Amlodipine   Aspirin   Atorvastatin  (Lipitor)  Hydrochlorothiazide   Linaclotide  (Linzess )  Naloxone  (Narcan )  Omega-3-Acid  Ethyl Esters (Lovaza )  Semaglutide   Sildenafil  (Viagra )  Tamsulosin  (Flomax )  Tizanidine  (Zanaflex )  Valsartan  ==================================================================== For clinical consultation, please call [phone]. ====================================================================     No results found for: CBDTHCR No results found for: D8THCCBX No results found for: D9THCCBX  ROS  Constitutional: Denies any fever or chills Gastrointestinal: No reported hemesis, hematochezia, vomiting, or acute GI distress Musculoskeletal: Lower back pain (R>L) Neurological: No reported episodes of acute onset apraxia, aphasia, dysarthria, agnosia, amnesia, paralysis, loss of coordination, or loss of consciousness  Medication Review  Semaglutide  (2 MG/DOSE), amLODIPine -Valsartan -HCTZ,  amoxicillin, aspirin  EC, atorvastatin , celecoxib , lidocaine , linaclotide , methylPREDNISolone , naloxone , naproxen, omega-3 acid ethyl esters, oxyCODONE , oxyCODONE -acetaminophen , sildenafil , tamsulosin , and tiZANidine   History Review  Allergy: Mr. Phung has no known allergies. Drug: Mr. Malenfant  reports no history of drug use. Alcohol :  reports no history of alcohol  use. Tobacco:  reports that he quit smoking about 24 years ago. His smoking use included cigarettes. He started smoking about 44 years ago. He has a 29.9 pack-year smoking history. He has never used smokeless tobacco. Social: Mr. Bogan  reports that he quit smoking about 24 years ago. His smoking use included cigarettes. He started smoking about 44 years ago. He has a 29.9 pack-year smoking history. He has never used smokeless tobacco. He reports that he does not drink alcohol  and does not use drugs. Medical:  has a past medical history of Arthritis and Hypertension. Surgical: Mr. Morain  has a past surgical history that includes Back surgery (lower back surgery x 3 ); Appendectomy (age 20); sebaceous cyst removed (Right, 15 years ago); Toe Fusion (Right); metal removed from eye (years ago); Total knee arthroplasty (Left, 08/10/2014); Total knee arthroplasty (Right, 12/21/2014); Total knee arthroplasty (Right, 12/21/14); Lumbar fusion (1999); Colonoscopy with propofol  (N/A, 05/04/2017); arthroscopic shoulder surgery (Right, 08/31/2017); and Colonoscopy with propofol  (N/A, 06/30/2022). Family: family history includes Cancer in his mother; Heart attack in his mother; Hyperlipidemia in his father; Hypertension in his father.  Laboratory Chemistry Profile   Renal Lab Results  Component Value Date   BUN 12 05/04/2023   CREATININE 0.81 05/04/2023   LABCREA 278 05/04/2023   BCR SEE NOTE: 05/04/2023   GFRAA 108 05/03/2020   GFRNONAA 93 05/03/2020    Hepatic Lab Results  Component Value Date   AST 15 05/04/2023   ALT 18 05/04/2023    ALBUMIN 4.6 11/11/2015   ALKPHOS 76 11/11/2015   HCVAB NEGATIVE 10/06/2016    Electrolytes Lab Results  Component Value Date   NA 139 05/04/2023   K 4.5 05/04/2023   CL 101 05/04/2023   CALCIUM  10.0 05/04/2023    Bone Lab Results  Component Value Date   VD25OH 43 10/06/2016    Inflammation (CRP: Acute Phase) (ESR: Chronic Phase) No results found for: CRP, ESRSEDRATE, LATICACIDVEN       Note: Above Lab results reviewed.  Recent Imaging Review  DG Lumbar Spine 2-3 Views CLINICAL DATA:  Low back pain.  Lifting injury.  EXAM: LUMBAR SPINE - 2-3 VIEW  COMPARISON:  MRI lumbar spine 05/01/2022, lumbar spine x-rays 03/06/2013.  FINDINGS: AP, lateral and lumbosacral spot lateral three views. There are again noted bilateral posterior fusion rods L3 through S1 with pedicle screws at  L3, L4 and S1 and chronic nondisplaced fracture of the fusion rods just below the bilateral L4 pedicle screws.  No hardware migration is seen. There is dorsal bone graft solid fusion, solid interbody fusion L3-4 and L4-5 at least a partial peripheral interbody fusion at L5-S1.  There is no evidence of fractures. There is normal bone mineralization. Grade 1 likely discogenic retrolisthesis is seen again at L1-2 and L2-3.  There is no new or further abnormal alignment. There are bulky left lateral osteophyte with bridging at L2-3, bulky marginal osteophytes with attempted bridging T10-L1.  Partial disc space loss lower thoracic and upper lumbar spine with disc space loss greatest at L1-2.  The SI joints appear to have bridging osteophytes of the superiorly.  There is scattered calcific plaque in the abdominal aorta. No nephrolithiasis.  IMPRESSION: 1. No evidence of acute fractures. 2. Chronic nondisplaced fracture of the bilateral posterior fusion rods just below the L4 pedicle screws. No hardware migration. 3. Degenerative and postsurgical changes as above. 4. Aortic  atherosclerosis.  Electronically Signed   By: Francis Quam M.D.   On: 01/04/2024 04:36 Note: Reviewed        Physical Exam  Vitals: BP 133/70 (Cuff Size: Normal)   Pulse 76   Temp (!) 97.2 F (36.2 C) (Temporal)   Resp 18   Ht 6' (1.829 m)   Wt 223 lb (101.2 kg)   SpO2 96%   BMI 30.24 kg/m  BMI: Estimated body mass index is 30.24 kg/m as calculated from the following:   Height as of this encounter: 6' (1.829 m).   Weight as of this encounter: 223 lb (101.2 kg). Ideal: Ideal body weight: 77.6 kg (171 lb 1.2 oz) Adjusted ideal body weight: 87 kg (191 lb 13.5 oz) General appearance: Well nourished, well developed, and well hydrated. In no apparent acute distress Mental status: Alert, oriented x 3 (person, place, & time)       Respiratory: No evidence of acute respiratory distress Eyes: PERLA  Musculoskeletal: +LBP  Lumbar Exam  Skin & Axial Inspection: No masses, redness, or swelling Alignment: Symmetrical Functional ROM: Pain restricted ROM affecting primarily the right Stability: No instability detected Muscle Tone/Strength: Functionally intact. No obvious neuro-muscular anomalies detected. Sensory (Neurological): Dermatomal pain pattern Palpation: No palpable anomalies       Provocative Tests: Hyperextension/rotation test: deferred today       Lumbar quadrant test (Kemp's test): deferred today       Lateral bending test: deferred today       *(Flexion, ABduction and External Rotation)  Gait & Posture Assessment  Ambulation: Unassisted Gait: Relatively normal for age and body habitus Posture: WNL  Lower Extremity Exam      Side: Right lower extremity   Side: Left lower extremity  Stability: No instability observed           Stability: No instability observed          Skin & Extremity Inspection: Skin color, temperature, and hair growth are WNL. No peripheral edema or cyanosis. No masses, redness, swelling, asymmetry, or associated skin lesions. No contractures.    Skin & Extremity Inspection: Skin color, temperature, and hair growth are WNL. No peripheral edema or cyanosis. No masses, redness, swelling, asymmetry, or associated skin lesions. No contractures.  Functional ROM: Unrestricted ROM                   Functional ROM: Unrestricted ROM  Muscle Tone/Strength: Functionally intact. No obvious neuro-muscular anomalies detected.   Muscle Tone/Strength: Functionally intact. No obvious neuro-muscular anomalies detected.  Sensory (Neurological): Unimpaired         Sensory (Neurological): Dermatomal       DTR: Patellar: deferred today Achilles: deferred today Plantar: deferred today   DTR: Patellar: deferred today Achilles: deferred today Plantar: deferred today  Palpation: No palpable anomalies   Palpation: No palpable anomalies   Assessment   Diagnosis Status  1. Chronic pain syndrome   2. Chronic use of opiate for therapeutic purpose   3. Long term prescription opiate use   4. Encounter for long-term opiate analgesic use   5. Medication management    Controlled Controlled Controlled   Updated Problems: No problems updated.  Plan of Care  Problem-specific:  Assessment and Plan    Chronic pain syndrome Chronic pain localized to lower right back. Managed with Percocet and OxyContin  without side effects or adverse reaction.  - Send prescriptions for Percocet and OxyContin  to [facility] for three-month supply. Patient's pain is well-controlled with oxycodone  and OxyContin , will continue on current medication regimen.  Having drug monitoring (PMP) reviewed, findings consistent with the use of prescribed medication and no evidence of narcotic misuse or abuse.  Urine drug screening (UDS) up to date and consistent with the use of medication.  Schedule follow-up in 90 days for medication management.  Chronic use of opioid for therapeutic purpose:  oxyCODONE -acetaminophen  (PERCOCET) 10-325 MG tablet    Sig: Take 1 tablet by mouth 2  (two) times daily as needed for pain.    Dispense:  60 tablet    Refill:  0   oxyCODONE -acetaminophen  (PERCOCET) 10-325 MG tablet    Sig: Take 1 tablet by mouth 2 (two) times daily as needed for pain.    Dispense:  60 tablet    Refill:  0   oxyCODONE  (OXYCONTIN ) 20 mg 12 hr tablet    Sig: Take 1 tablet (20 mg total) by mouth every 12 (twelve) hours. Months last 30 days.    Dispense:  60 tablet    Refill:  0    Chronic Pain: STOP Act (Not applicable) Fill 1 day early if closed on refill date. Avoid benzodiazepines within 8 hours of opioids   oxyCODONE  (OXYCONTIN ) 20 mg 12 hr tablet    Sig: Take 1 tablet (20 mg total) by mouth every 12 (twelve) hours. Months last 30 days.    Dispense:  60 tablet    Refill:  0    Chronic Pain: STOP Act (Not applicable) Fill 1 day early if closed on refill date. Avoid benzodiazepines within 8 hours of opioids   oxyCODONE  (OXYCONTIN ) 20 mg 12 hr tablet    Sig: Take 1 tablet (20 mg total) by mouth every 12 (twelve) hours. Months last 30 days.    Dispense:  60 tablet    Refill:  0    Chronic Pain: STOP Act (Not applicable) Fill 1 day early if closed on refill date. Avoid benzodiazepines within 8 hours of opioids   oxyCODONE -acetaminophen  (PERCOCET) 10-325 MG tablet    Sig: Take 1 tablet by mouth 2 (two) times daily as needed for pain.    Dispense:  60 tablet    Refill:  0        [name]. has a current medication list which includes the following long-term medication(s): amlodipine -valsartan -hctz, atorvastatin , linaclotide , omega-3 acid ethyl esters, sildenafil , [START ON 01/22/2024] oxycodone -acetaminophen , [START ON 02/21/2024] oxycodone -acetaminophen , and [START ON  03/22/2024] oxycodone -acetaminophen .  Pharmacotherapy (Medications Ordered): Meds ordered this encounter  Medications   oxyCODONE -acetaminophen  (PERCOCET) 10-325 MG tablet    Sig: Take 1 tablet by mouth 2 (two) times daily as needed for pain.    Dispense:  60 tablet     Refill:  0   oxyCODONE -acetaminophen  (PERCOCET) 10-325 MG tablet    Sig: Take 1 tablet by mouth 2 (two) times daily as needed for pain.    Dispense:  60 tablet    Refill:  0   oxyCODONE  (OXYCONTIN ) 20 mg 12 hr tablet    Sig: Take 1 tablet (20 mg total) by mouth every 12 (twelve) hours. Months last 30 days.    Dispense:  60 tablet    Refill:  0    Chronic Pain: STOP Act (Not applicable) Fill 1 day early if closed on refill date. Avoid benzodiazepines within 8 hours of opioids   oxyCODONE  (OXYCONTIN ) 20 mg 12 hr tablet    Sig: Take 1 tablet (20 mg total) by mouth every 12 (twelve) hours. Months last 30 days.    Dispense:  60 tablet    Refill:  0    Chronic Pain: STOP Act (Not applicable) Fill 1 day early if closed on refill date. Avoid benzodiazepines within 8 hours of opioids   oxyCODONE  (OXYCONTIN ) 20 mg 12 hr tablet    Sig: Take 1 tablet (20 mg total) by mouth every 12 (twelve) hours. Months last 30 days.    Dispense:  60 tablet    Refill:  0    Chronic Pain: STOP Act (Not applicable) Fill 1 day early if closed on refill date. Avoid benzodiazepines within 8 hours of opioids   oxyCODONE -acetaminophen  (PERCOCET) 10-325 MG tablet    Sig: Take 1 tablet by mouth 2 (two) times daily as needed for pain.    Dispense:  60 tablet    Refill:  0   Orders:  No orders of the defined types were placed in this encounter.     Return in about 3 months (around 04/20/2024) for (F2F), (MM), Emmy Blanch NP.    Recent Visits Date Type Provider Dept  10/26/23 Office Visit Diar Berkel K, NP [facility]  Showing recent visits within past 90 days and meeting all other requirements Today's Visits Date Type Provider Dept  01/19/24 Office Visit Dorlis Judice K, NP [facility]  Showing today's visits and meeting all other requirements Future Appointments No visits were found meeting these conditions. Showing future appointments within next 90 days and meeting all other  requirements  I discussed the assessment and treatment plan with the patient. The patient was provided an opportunity to ask questions and all were answered. The patient agreed with the plan and demonstrated an understanding of the instructions.  Patient advised to call back or seek an in-person evaluation if the symptoms or condition worsens.  I personally spent a total of 30 minutes in the care of the patient today including preparing to see the patient, getting/reviewing separately obtained history, performing a medically appropriate exam/evaluation, counseling and educating, placing orders, referring and communicating with other health care professionals, documenting clinical information in the EHR, independently interpreting results, communicating results, and coordinating care.   Note by: Tinesha Siegrist K Clarke Peretz, NP (TTS and AI technology used. I apologize for any typographical errors that were not detected and corrected.) Date: 01/19/2024; Time: 8:27 AM

## 2024-01-24 ENCOUNTER — Ambulatory Visit: Admitting: Podiatry

## 2024-01-24 DIAGNOSIS — M7662 Achilles tendinitis, left leg: Secondary | ICD-10-CM

## 2024-01-24 NOTE — Progress Notes (Signed)
 He presents today for follow-up of his bursitis and Achilles tendinitis left.  He states that he is doing so much better he relates 90 to 95% at this time.  States that he has noticed a little bit of pain over the last day or so trying to creep back in.  He states that he continues to take his Celebrex  regularly.  However he has not been sleeping with a night splint recently.  He does go on to say that his wife passed away last month.  Objective: Vital signs are stable alert and oriented x 3.  Pulses are palpable.  There is no erythema edema cellulitis drainage or odor.  No reproducible pain on palpation of the posterior Achilles area no fluctuance as felt before with the bursitis.  Assessment: Resolution of the bursitis synovitis posterior aspect of his left Achilles.  Plan: I did request that he continue to sleep in the cam boot/night splint and continue the Celebrex .  Also suggest that he add ice to the area as part of his treatment plan.  I will follow-up with him in 1 month if necessary.  May still need to consider MRI at that point.

## 2024-02-04 ENCOUNTER — Other Ambulatory Visit: Payer: Self-pay | Admitting: Family Medicine

## 2024-02-04 DIAGNOSIS — E1169 Type 2 diabetes mellitus with other specified complication: Secondary | ICD-10-CM

## 2024-02-04 NOTE — Telephone Encounter (Signed)
 Pt has 6 month f/u in jan.

## 2024-02-14 DIAGNOSIS — K080 Exfoliation of teeth due to systemic causes: Secondary | ICD-10-CM | POA: Diagnosis not present

## 2024-02-21 ENCOUNTER — Ambulatory Visit: Admitting: Podiatry

## 2024-02-21 DIAGNOSIS — M7662 Achilles tendinitis, left leg: Secondary | ICD-10-CM | POA: Diagnosis not present

## 2024-02-21 NOTE — Progress Notes (Signed)
 He presents today for follow-up of pain to his left posterior heel.  He states that is still tender really has not changed a lot.  States that is sort of a low-grade tenderness.  He states that he just really does not have time to do surgery at this point so he does not even want to have the MRI done at this point.  He states that maybe in the springtime he will be in a better position to do so.  Objective: Vital signs stable alert and oriented x 3 pulses are palpable.  He does have tenderness on palpation of the posterior aspect of the Achilles at its insertion site with some fluctuance consistent with bursitis.  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  Plan: I recommended that he follow-up with us  at any point in the near future that this heel should become painful again.  I did recommend that we consider MRI prior to any surgical intervention.

## 2024-03-15 DIAGNOSIS — K080 Exfoliation of teeth due to systemic causes: Secondary | ICD-10-CM | POA: Diagnosis not present

## 2024-03-22 DIAGNOSIS — K080 Exfoliation of teeth due to systemic causes: Secondary | ICD-10-CM | POA: Diagnosis not present

## 2024-03-23 ENCOUNTER — Other Ambulatory Visit: Payer: Self-pay | Admitting: Podiatry

## 2024-04-08 ENCOUNTER — Other Ambulatory Visit: Payer: Self-pay | Admitting: Family Medicine

## 2024-04-08 DIAGNOSIS — E1169 Type 2 diabetes mellitus with other specified complication: Secondary | ICD-10-CM

## 2024-04-09 ENCOUNTER — Other Ambulatory Visit: Payer: Self-pay | Admitting: Family Medicine

## 2024-04-09 DIAGNOSIS — E1159 Type 2 diabetes mellitus with other circulatory complications: Secondary | ICD-10-CM

## 2024-04-10 ENCOUNTER — Encounter: Payer: Self-pay | Admitting: Family Medicine

## 2024-04-10 ENCOUNTER — Ambulatory Visit (INDEPENDENT_AMBULATORY_CARE_PROVIDER_SITE_OTHER): Admitting: Family Medicine

## 2024-04-10 VITALS — BP 122/74 | HR 69 | Resp 16 | Ht 72.0 in | Wt 224.8 lb

## 2024-04-10 DIAGNOSIS — E1159 Type 2 diabetes mellitus with other circulatory complications: Secondary | ICD-10-CM

## 2024-04-10 DIAGNOSIS — T402X5A Adverse effect of other opioids, initial encounter: Secondary | ICD-10-CM

## 2024-04-10 DIAGNOSIS — G894 Chronic pain syndrome: Secondary | ICD-10-CM

## 2024-04-10 DIAGNOSIS — Z7985 Long-term (current) use of injectable non-insulin antidiabetic drugs: Secondary | ICD-10-CM

## 2024-04-10 DIAGNOSIS — N401 Enlarged prostate with lower urinary tract symptoms: Secondary | ICD-10-CM

## 2024-04-10 DIAGNOSIS — F1120 Opioid dependence, uncomplicated: Secondary | ICD-10-CM

## 2024-04-10 DIAGNOSIS — Z23 Encounter for immunization: Secondary | ICD-10-CM

## 2024-04-10 DIAGNOSIS — E785 Hyperlipidemia, unspecified: Secondary | ICD-10-CM

## 2024-04-10 DIAGNOSIS — R351 Nocturia: Secondary | ICD-10-CM

## 2024-04-10 DIAGNOSIS — K5903 Drug induced constipation: Secondary | ICD-10-CM

## 2024-04-10 DIAGNOSIS — M7662 Achilles tendinitis, left leg: Secondary | ICD-10-CM

## 2024-04-10 DIAGNOSIS — E1169 Type 2 diabetes mellitus with other specified complication: Secondary | ICD-10-CM

## 2024-04-10 DIAGNOSIS — I152 Hypertension secondary to endocrine disorders: Secondary | ICD-10-CM

## 2024-04-10 LAB — POCT GLYCOSYLATED HEMOGLOBIN (HGB A1C): Hemoglobin A1C: 5.3 % (ref 4.0–5.6)

## 2024-04-10 MED ORDER — AMLODIPINE-VALSARTAN-HCTZ 10-320-25 MG PO TABS: 1.0000 | ORAL_TABLET | Freq: Every day | ORAL | 0 refills | Status: AC

## 2024-04-10 MED ORDER — LINACLOTIDE 72 MCG PO CAPS: 72.0000 ug | ORAL_CAPSULE | Freq: Every day | ORAL | 1 refills | Status: DC

## 2024-04-10 MED ORDER — ATORVASTATIN CALCIUM 20 MG PO TABS: 20.0000 mg | ORAL_TABLET | Freq: Every day | ORAL | 1 refills | Status: AC

## 2024-04-10 MED ORDER — ATORVASTATIN CALCIUM 20 MG PO TABS: 20.0000 mg | ORAL_TABLET | Freq: Every day | ORAL | 1 refills | Status: DC

## 2024-04-10 MED ORDER — TAMSULOSIN HCL 0.4 MG PO CAPS: 0.4000 mg | ORAL_CAPSULE | Freq: Every day | ORAL | 1 refills | Status: AC

## 2024-04-10 MED ORDER — TAMSULOSIN HCL 0.4 MG PO CAPS: 0.4000 mg | ORAL_CAPSULE | Freq: Every day | ORAL | 1 refills | Status: DC

## 2024-04-10 MED ORDER — OMEGA-3-ACID ETHYL ESTERS 1 G PO CAPS: 2.0000 g | ORAL_CAPSULE | Freq: Two times a day (BID) | ORAL | 1 refills | Status: AC

## 2024-04-10 MED ORDER — AMLODIPINE-VALSARTAN-HCTZ 10-320-25 MG PO TABS: 1.0000 | ORAL_TABLET | Freq: Every day | ORAL | 0 refills | Status: DC

## 2024-04-10 MED ORDER — OZEMPIC (2 MG/DOSE) 8 MG/3ML ~~LOC~~ SOPN: 1.0000 | PEN_INJECTOR | SUBCUTANEOUS | 1 refills | Status: DC

## 2024-04-10 MED ORDER — LINACLOTIDE 72 MCG PO CAPS: 72.0000 ug | ORAL_CAPSULE | Freq: Every day | ORAL | 1 refills | Status: AC

## 2024-04-10 MED ORDER — OZEMPIC (2 MG/DOSE) 8 MG/3ML ~~LOC~~ SOPN: 1.0000 | PEN_INJECTOR | SUBCUTANEOUS | 1 refills | Status: AC

## 2024-04-10 MED ORDER — OMEGA-3-ACID ETHYL ESTERS 1 G PO CAPS: 2.0000 g | ORAL_CAPSULE | Freq: Two times a day (BID) | ORAL | 1 refills | Status: DC

## 2024-04-10 NOTE — Progress Notes (Signed)
 Name: Lawrence Thornton.   MRN: [id number]    DOB: 08-Jun-1955   Date:04/10/2024       Progress Note  Subjective  Chief Complaint  Chief Complaint  Patient presents with   Medical Management of Chronic Issues   Discussed the use of AI scribe software for clinical note transcription with the patient, who gave verbal consent to proceed.  History of Present Illness Lawrence Thornton. is a 69 year old male with chronic pain and diabetes who presents for a follow-up visit.  He experiences chronic back pain, managed with oxycodone  and oxycontin . He takes oxycontin  3 mg twice a day and oxycodone  as needed, along with tizanidine  4 mg. His pain level is 4 out of 10, which is manageable with medication. He has been on this regimen for 15 to 20 years.  He has left heel pain, for which he takes Celebrex . He takes Celebrex  100 mg twice a day. His heel pain worsens in the morning and improves with rest and ice application. He has not scheduled surgery for his heel because he does not have anyone to help care for him at home.  He has type 2 diabetes, hypertension, and dyslipidemia. He takes Ozempic  2 mg weekly for diabetes, atorvastatin  20 mg and Lovaza  for dyslipidemia, and a combination of amlodipine , valsartan , and hydrochlorothiazide  for hypertension. He does not monitor his blood sugar at home but has no symptoms of hyperglycemia or hypoglycemia.     He reports occasional right hand tremors and numbness in his fingers when lying down. He does not want to be evaluated at this time, afraid of cost  He experiences chronic constipation, managed with Linzess , which helps with the constipation caused by opioid use.  His BMI is 30.49, and he is trying to manage his weight through diet. He avoids fried foods and tries to eat vegetables but struggles with meal preparation since his wife's passing. He often eats at [employer].  He takes Flomax  for benign prostatic hyperplasia and reports no  significant urinary symptoms.  He is dealing with the aftermath of his wife's passing, including financial and administrative tasks, with the help of his cousin. He is a widower and lives alone. He does not consume alcohol  or tobacco and has been playing the guitar as a form of therapy.    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/30/2017   S/P lumbar fusion 07/13/2017   Chronic pain syndrome 07/13/2017   History of colonic polyps    Constipation due to opioid therapy 07/18/2015   Hypertriglyceridemia 01/22/2015   S/P knee replacement 12/21/2014   Chronic right-sided low back pain without sciatica 09/21/2014   BPH associated with nocturia 09/21/2014   Dyslipidemia 09/21/2014   Continuous opioid dependence (HCC) 09/21/2014   Lumbar degenerative disc disease 09/21/2014   Failure of erection 09/21/2014    Past Surgical History:  Procedure Laterality Date   APPENDECTOMY  age 38   arthroscopic shoulder surgery Right 08/31/2017  BACK SURGERY  lower back surgery x 3    L 3 L 4 and L 5 are fused together   COLONOSCOPY WITH PROPOFOL  N/A 05/04/2017   Procedure: COLONOSCOPY WITH PROPOFOL ;  Surgeon: Jinny Carmine, MD;  Location: [facility];  Service: Endoscopy;  Laterality: N/A;   COLONOSCOPY WITH PROPOFOL  N/A 06/30/2022   Procedure: COLONOSCOPY WITH PROPOFOL ;  Surgeon: Jinny Carmine, MD;  Location: [facility];  Service: Endoscopy;   Laterality: N/A;   LUMBAR FUSION  1999   L- to L5   metal removed from eye  years ago   sebaceous cyst removed Right 15 years ago   middle  finger    TOE FUSION Right    4th toe   TOTAL KNEE ARTHROPLASTY Left 08/10/2014   Procedure: LEFT TOTAL KNEE ARTHROPLASTY;  Surgeon: Lamar Collet, MD;  Location: [facility];  Service: Orthopedics;  Laterality: Left;   TOTAL KNEE ARTHROPLASTY Right 12/21/2014   Procedure: RIGHT TOTAL KNEE ARTHROPLASTY;  Surgeon: Lamar Collet, MD;  Location: [facility];  Service: Orthopedics;  Laterality: Right;   TOTAL KNEE ARTHROPLASTY Right 12/21/14    Family History  Problem Relation Age of Onset   Cancer Mother        Throat   Heart attack Mother    Hypertension Father    Hyperlipidemia Father     Social History   Tobacco Use   Smoking status: Former    Current packs/day: 0.00    Average packs/day: 1.5 packs/day for 19.9 years (29.9 ttl pk-yrs)    Types: Cigarettes    Start date: 04/07/1979    Quit date: 04/07/1999    Years since quitting: 25.0   Smokeless tobacco: Never   Tobacco comments:    not smoking   Substance Use Topics   Alcohol  use: No    Current Medications[1]  Allergies[2]  I personally reviewed active problem list, medication list, allergies, family history with the patient/caregiver today.   ROS  Ten systems reviewed and is negative except as mentioned in HPI    Objective Physical Exam MEASUREMENTS: Weight- 209, BMI- 30.49. CONSTITUTIONAL: Patient appears well-developed and well-nourished. No distress. HEENT: Head atraumatic, normocephalic, neck supple. CARDIOVASCULAR: Normal rate, regular rhythm and normal heart sounds. No murmur heard. No BLE edema. PULMONARY: Effort normal and breath sounds normal. Lungs clear to auscultation. No respiratory distress. ABDOMINAL: There is no tenderness or distention. MUSCULOSKELETAL: Normal gait. Without gross motor or sensory deficit. PSYCHIATRIC: Patient has a normal mood and affect. Behavior  is normal. Judgment and thought content normal.  Vitals:   04/10/24 0838  BP: 122/74  Pulse: 69  Resp: 16  SpO2: 96%  Weight: 224 lb 12.8 oz (102 kg)  Height: 6' (1.829 m)    Body mass index is 30.49 kg/m.  Recent Results (from the past 2160 hours)  POCT glycosylated hemoglobin (Hb A1C)     Status: None   Collection Time: 04/10/24  8:41 AM  Result Value Ref Range   Hemoglobin A1C 5.3 4.0 - 5.6 %   HbA1c POC (<> result, manual entry)     HbA1c, POC (prediabetic range)     HbA1c, POC (controlled diabetic range)      Diabetic Foot Exam:  Diabetic foot exam was performed with the following findings:   No deformities, ulcerations, or other skin breakdown Normal sensation of 10g monofilament Intact posterior tibialis and dorsalis pedis pulses      PHQ2/9:    04/10/2024    8:30 AM 01/19/2024    8:12 AM  10/26/2023    8:22 AM 09/28/2023    8:28 AM 08/05/2023    8:10 AM  Depression screen PHQ 2/9  Decreased Interest 0 0 0 0 0  Down, Depressed, Hopeless 0 0 0 0 0  PHQ - 2 Score 0 0 0 0 0  Altered sleeping    0 0  Tired, decreased energy    0 0  Change in appetite    0 0  Feeling bad or failure about yourself     0 0  Trouble concentrating    0 0  Moving slowly or fidgety/restless    0 0  Suicidal thoughts    0 0  PHQ-9 Score    0  0   Difficult doing work/chores    Not difficult at all Not difficult at all     Data saved with a previous flowsheet row definition    phq 9 is negative  Fall Risk:    04/10/2024    8:30 AM 01/19/2024    8:12 AM 10/26/2023    8:22 AM 09/28/2023    8:27 AM 08/05/2023    8:14 AM  Fall Risk   Falls in the past year? 0 0 0 0 0  Number falls in past yr: 0   0 0  Injury with Fall? 0   0  0   Risk for fall due to : No Fall Risks   No Fall Risks No Fall Risks  Follow up Falls evaluation completed   Falls prevention discussed;Education provided;Falls evaluation completed Falls prevention discussed;Falls evaluation completed     Data saved with a  previous flowsheet row definition     Assessment & Plan Chronic pain syndrome with continuous opioid dependence and opioid-induced constipation Pain managed with oxycodone  and oxycontin . Constipation managed with Linzess . - Continue oxycodone  and oxycontin  as prescribed. - Continue Linzess  for opioid-induced constipation.  Achilles tendinitis, left leg Chronic tendinitis with deferred surgery due to lack of support. Pain managed with ice and rest. - Continue using ice and rest for pain management. - Deferred surgery until post-operative support is available.  Type 2 diabetes mellitus with hypertension and dyslipidemia Diabetes managed with Ozempic . Hypertension managed with amlodipine , valsartan , and hydrochlorothiazide . Dyslipidemia managed with atorvastatin  and fish oil. - Continue Ozempic  2 mg weekly. - Continue atorvastatin  20 mg daily. - Continue amlodipine , valsartan , and hydrochlorothiazide  as prescribed. - Continue fish oil supplementation.  Benign prostatic hyperplasia with lower urinary tract symptoms BPH symptoms managed with tamsulosin . - Continue tamsulosin  as prescribed.  Obesity BMI 30.49. Weight management ongoing with dietary adjustments. Goal weight 200 pounds. - Continue dietary adjustments focusing on protein and vegetables. - Encouraged weight loss to reach goal weight of 200 pounds.  General health maintenance Routine health maintenance discussed with emphasis on diet and activity. - Encouraged balanced diet with adequate protein and vegetables.        [1]  Current Outpatient Medications:    amLODIPine -Valsartan -HCTZ 10-320-25 MG TABS, TAKE 1 TABLET BY MOUTH ONCE A DAY, Disp: 90 tablet, Rfl: 0   aspirin  EC 81 MG tablet, Take 1 tablet (81 mg total) by mouth daily., Disp: 30 tablet, Rfl: 0   atorvastatin  (LIPITOR) 20 MG tablet, TAKE 1 TABLET BY MOUTH EVERY DAY, Disp: 90 tablet, Rfl: 1   celecoxib  (CELEBREX ) 200 MG capsule, TAKE 1 CAPSULE(200 MG) BY  MOUTH TWICE DAILY, Disp: 180 capsule, Rfl: 0   linaclotide  (LINZESS ) 72 MCG capsule, Take 1 capsule (72 mcg total) by mouth daily before breakfast., Disp:  90 capsule, Rfl: 1   naloxone  (NARCAN ) 2 MG/2ML injection, Inject 1 mL (1 mg total) into the muscle as needed for up to 2 doses (for opioid overdose). Inject content of syringe into thigh muscle. Call 911., Disp: 2 mL, Rfl: 1   omega-3 acid ethyl esters (LOVAZA ) 1 g capsule, Take 2 capsules (2 g total) by mouth 2 (two) times daily., Disp: 360 capsule, Rfl: 1   oxyCODONE  (OXYCONTIN ) 20 mg 12 hr tablet, Take 1 tablet (20 mg total) by mouth every 12 (twelve) hours. Months last 30 days., Disp: 60 tablet, Rfl: 0   oxyCODONE -acetaminophen  (PERCOCET) 10-325 MG tablet, Take 1 tablet by mouth 2 (two) times daily as needed for pain., Disp: 60 tablet, Rfl: 0   Semaglutide , 2 MG/DOSE, (OZEMPIC , 2 MG/DOSE,) 8 MG/3ML SOPN, INJECT 2 MG AS DIRECTED ONCE A WEEK., Disp: 9 mL, Rfl: 0   sildenafil  (VIAGRA ) 100 MG tablet, Take 1 tablet (100 mg total) by mouth daily as needed for erectile dysfunction., Disp: 30 tablet, Rfl: 0   tamsulosin  (FLOMAX ) 0.4 MG CAPS capsule, TAKE 1 CAPSULE BY MOUTH ONCE A DAY 30 MINS AFTER LARGEST MEAL, Disp: 90 capsule, Rfl: 1   tiZANidine  (ZANAFLEX ) 4 MG tablet, Take 1 tablet (4 mg total) by mouth 2 (two) times daily as needed for muscle spasms., Disp: 60 tablet, Rfl: 5   oxyCODONE -acetaminophen  (PERCOCET) 10-325 MG tablet, Take 1 tablet by mouth 2 (two) times daily as needed for pain., Disp: 60 tablet, Rfl: 0   oxyCODONE -acetaminophen  (PERCOCET) 10-325 MG tablet, Take 1 tablet by mouth 2 (two) times daily as needed for pain., Disp: 60 tablet, Rfl: 0 [2] No Known Allergies

## 2024-04-11 NOTE — Progress Notes (Signed)
 PROVIDER NOTE: Interpretation of information contained herein should be left to medically-trained personnel. Specific patient instructions are provided elsewhere under Patient Instructions section of medical record. This document was created in part using AI and STT-dictation technology, any transcriptional errors that may result from this process are unintentional.  Patient: Lawrence Thornton.  Service: E/M   PCP: Sowles, Krichna, MD  DOB: Sep 05, 1955  DOS: 04/13/2024  Provider: Emmy MARLA Blanch, NP  MRN: [id number]  Delivery: Face-to-face  Specialty: Interventional Pain Management  Type: Established Patient  Setting: Ambulatory outpatient facility  Specialty designation: 09  Referring Prov.: Glenard Mire, MD  Location: Outpatient office facility       History of present illness (HPI) Mr. Tahmir Thornton., a 69 y.o. year old male, is here today because of his low back pain. Mr. Cronkright primary complain today is Back Pain  Pertinent problems: Mr. Griep has Chronic right-sided low back pain without sciatica; Continuous opioid dependence (HCC); Lumbar degenerative disc disease; S/P knee replacement; Constipation due to opioid therapy; S/P lumbar fusion; Chronic pain syndrome; Osteoarthritis of acromioclavicular joint; Dyslipidemia associated with type 2 diabetes mellitus (HCC); Long term current use of opiate analgesic; Lumbar spondylosis; Chronic use of opiate for therapeutic purpose; Radicular pain of left lower extremity; Spinal stenosis, lumbar region, with neurogenic claudication; Achilles tendinitis of left lower extremity; and Long term prescription opiate use on their pertinent problem list.  Pain Assessment: Severity of Chronic pain is reported as a 4 /10. Location: Back Lower, Right/Denies. Onset: More than a month ago. Quality: [name]. Timing: Constant. Modifying factor(s): Walking and pain medication. Vitals:  height is 6' (1.829 m) and weight is 220 lb (99.8 kg). His  temporal temperature is 97.9 F (36.6 C). His blood pressure is 138/70 and his pulse is 94. His respiration is 20 and oxygen saturation is 93%.  BMI: Estimated body mass index is 29.84 kg/m as calculated from the following:   Height as of this encounter: 6' (1.829 m).   Weight as of this encounter: 220 lb (99.8 kg).  Last encounter: 01/19/2024. Last procedure: Visit date not found.  Reason for encounter: medication management. No change in medical history since last visit.  Patient's pain is at baseline.  Patient continues multimodal pain regimen as prescribed.  States that it provides pain relief and improvement in functional status.   Discussed the use of AI scribe software for clinical note transcription with the patient, who gave verbal consent to proceed.  History of Present Illness   Lawrence Thornton. is a 69 year old male with chronic back pain who presents for pain management follow-up.  He experiences ongoing moderate back pain, rating it as a four on a scale. The pain is managed with regular walking, which he does due to his dog, and medication. He is currently taking Oxycontin  and Percocet for pain management, with no adverse reactions reported except for occasional constipation.  For constipation, he uses Linzess  as needed and notes that he has not required it recently. He confirms having a supply of Linzess  and refills available.  He also takes tizanidine  for muscle spasms, which he recently refilled with three bottles, ensuring he has an adequate supply. His medications are filled at [facility].  He has a husky dog that requires regular walking. No adverse reactions to his pain medications, including no constipation, except for occasional constipation managed with Linzess .     Pharmacotherapy Assessment   Oxycodone -acetaminophen  (Percocet) 10-325 mg 2 times daily as needed  for pain. MME=30 OxyContin  ER 20 Mg every 12 hours as needed for chronic pain.  MME=60 Monitoring: [place] PMP: PDMP reviewed during this encounter.       Pharmacotherapy: No side-effects or adverse reactions reported. Compliance: No problems identified. Effectiveness: Clinically acceptable.  Erlene Doyal SAUNDERS, [place]  04/13/2024  8:46 AM  Sign when Signing Visit Nursing Pain Medication Assessment:  Safety precautions to be maintained throughout the outpatient stay will include: orient to surroundings, keep bed in low position, maintain call bell within reach at all times, provide assistance with transfer out of bed and ambulation.  Medication Inspection Compliance: Pill count conducted under aseptic conditions, in front of the patient. Neither the pills nor the bottle was removed from the patient's sight at any time. Once count was completed pills were immediately returned to the patient in their original bottle.  Medication: Oxycodone /APAP Pill/Patch Count: 26 of 60 pills/patches remain Pill/Patch Appearance: Markings consistent with prescribed medication Bottle Appearance: Standard pharmacy container. Clearly labeled. Filled Date: 6 / 48 / 2025 Last Medication intake:  Nilsa Erlene Doyal SAUNDERS, [facility]  04/13/2024  8:44 AM  Sign when Signing Visit Nursing Pain Medication Assessment:  Safety precautions to be maintained throughout the outpatient stay will include: orient to surroundings, keep bed in low position, maintain call bell within reach at all times, provide assistance with transfer out of bed and ambulation.  Medication Inspection Compliance: Pill count conducted under aseptic conditions, in front of the patient. Neither the pills nor the bottle was removed from the patient's sight at any time. Once count was completed pills were immediately returned to the patient in their original bottle.  Medication: Oxycontin  20mg  Pill/Patch Count: 26 of 60 pills/patches remain Pill/Patch Appearance: Markings consistent with prescribed medication Bottle Appearance: Standard pharmacy  container. Clearly labeled. Filled Date: 70 / 40 / 2025 Last Medication intake:  Yesterday    UDS:  Summary  Date Value Ref Range Status  07/27/2023 FINAL  Final    Comment:    ==================================================================== [employer] [facility] 13 (MW) ==================================================================== Test                             Result       Flag       Units  Drug Present and Declared for Prescription Verification   Oxycodone                       4612         EXPECTED   ng/mg creat   Oxymorphone                    2721         EXPECTED   ng/mg creat   Noroxycodone                   5425         EXPECTED   ng/mg creat   Noroxymorphone                 837          EXPECTED   ng/mg creat    Sources of oxycodone  are scheduled prescription medications.    Oxymorphone, noroxycodone, and noroxymorphone are expected    metabolites of oxycodone . Oxymorphone is also available as a    scheduled prescription medication.  ==================================================================== Test  Result    Flag   Units      Ref Range   Creatinine              91               mg/dL      >=79 ==================================================================== Declared Medications:  The flagging and interpretation on this report are based on the  following declared medications.  Unexpected results may arise from  inaccuracies in the declared medications.   **Note: The testing scope of this panel includes these medications:   Oxycodone  (Oxycontin )  Oxycodone  (Percocet)   **Note: The testing scope of this panel does not include the  following reported medications:   Acetaminophen  (Percocet)  Amlodipine   Aspirin   Atorvastatin  (Lipitor)  Hydrochlorothiazide   Linaclotide  (Linzess )  Naloxone  (Narcan )  Omega-3-Acid  Ethyl Esters (Lovaza )  Semaglutide   Sildenafil  (Viagra )  Tamsulosin  (Flomax )  Tizanidine  (Zanaflex )   Valsartan  ==================================================================== For clinical consultation, please call [phone]. ====================================================================     No results found for: CBDTHCR No results found for: D8THCCBX No results found for: D9THCCBX  ROS  Constitutional: Denies any fever or chills Gastrointestinal: No reported hemesis, hematochezia, vomiting, or acute GI distress Musculoskeletal: low back pain Neurological: No reported episodes of acute onset apraxia, aphasia, dysarthria, agnosia, amnesia, paralysis, loss of coordination, or loss of consciousness  Medication Review  Semaglutide  (2 MG/DOSE), amLODIPine -Valsartan -HCTZ, aspirin  EC, atorvastatin , celecoxib , linaclotide , naloxone , omega-3 acid ethyl esters, oxyCODONE , oxyCODONE -acetaminophen , sildenafil , tamsulosin , and tiZANidine   History Review  Allergy: Mr. Walle has no known allergies. Drug: Mr. Bonn  reports no history of drug use. Alcohol :  reports no history of alcohol  use. Tobacco:  reports that he quit smoking about 25 years ago. His smoking use included cigarettes. He started smoking about 45 years ago. He has a 29.9 pack-year smoking history. He has never used smokeless tobacco. Social: Mr. Pipkins  reports that he quit smoking about 25 years ago. His smoking use included cigarettes. He started smoking about 45 years ago. He has a 29.9 pack-year smoking history. He has never used smokeless tobacco. He reports that he does not drink alcohol  and does not use drugs. Medical:  has a past medical history of Arthritis and Hypertension. Surgical: Mr. Bertram  has a past surgical history that includes Back surgery (lower back surgery x 3 ); Appendectomy (age 61); sebaceous cyst removed (Right, 15 years ago); Toe Fusion (Right); metal removed from eye (years ago); Total knee arthroplasty (Left, 08/10/2014); Total knee arthroplasty (Right, 12/21/2014); Total knee  arthroplasty (Right, 12/21/14); Lumbar fusion (1999); Colonoscopy with propofol  (N/A, 05/04/2017); arthroscopic shoulder surgery (Right, 08/31/2017); and Colonoscopy with propofol  (N/A, 06/30/2022). Family: family history includes Cancer in his mother; Heart attack in his mother; Hyperlipidemia in his father; Hypertension in his father.  Laboratory Chemistry Profile   Renal Lab Results  Component Value Date   BUN 12 05/04/2023   CREATININE 0.81 05/04/2023   LABCREA 278 05/04/2023   BCR SEE NOTE: 05/04/2023   GFRAA 108 05/03/2020   GFRNONAA 93 05/03/2020    Hepatic Lab Results  Component Value Date   AST 15 05/04/2023   ALT 18 05/04/2023   ALBUMIN 4.6 11/11/2015   ALKPHOS 76 11/11/2015   HCVAB NEGATIVE 10/06/2016    Electrolytes Lab Results  Component Value Date   NA 139 05/04/2023   K 4.5 05/04/2023   CL 101 05/04/2023   CALCIUM  10.0 05/04/2023    Bone Lab Results  Component Value Date   VD25OH 43 10/06/2016  Inflammation (CRP: Acute Phase) (ESR: Chronic Phase) No results found for: CRP, ESRSEDRATE, LATICACIDVEN       Note: Above Lab results reviewed.  Recent Imaging Review  DG Lumbar Spine 2-3 Views CLINICAL DATA:  Low back pain.  Lifting injury.  EXAM: LUMBAR SPINE - 2-3 VIEW  COMPARISON:  MRI lumbar spine 05/01/2022, lumbar spine x-rays 03/06/2013.  FINDINGS: AP, lateral and lumbosacral spot lateral three views. There are again noted bilateral posterior fusion rods L3 through S1 with pedicle screws at L3, L4 and S1 and chronic nondisplaced fracture of the fusion rods just below the bilateral L4 pedicle screws.  No hardware migration is seen. There is dorsal bone graft solid fusion, solid interbody fusion L3-4 and L4-5 at least a partial peripheral interbody fusion at L5-S1.  There is no evidence of fractures. There is normal bone mineralization. Grade 1 likely discogenic retrolisthesis is seen again at L1-2 and L2-3.  There is no new or further  abnormal alignment. There are bulky left lateral osteophyte with bridging at L2-3, bulky marginal osteophytes with attempted bridging T10-L1.  Partial disc space loss lower thoracic and upper lumbar spine with disc space loss greatest at L1-2.  The SI joints appear to have bridging osteophytes of the superiorly.  There is scattered calcific plaque in the abdominal aorta. No nephrolithiasis.  IMPRESSION: 1. No evidence of acute fractures. 2. Chronic nondisplaced fracture of the bilateral posterior fusion rods just below the L4 pedicle screws. No hardware migration. 3. Degenerative and postsurgical changes as above. 4. Aortic atherosclerosis.  Electronically Signed   By: Francis Quam M.D.   On: 01/04/2024 04:36 Note: Reviewed        Physical Exam  Vitals: BP 138/70 (BP Location: Left Arm, Patient Position: Sitting, Cuff Size: Normal)   Pulse 94   Temp 97.9 F (36.6 C) (Temporal)   Resp 20   Ht 6' (1.829 m)   Wt 220 lb (99.8 kg)   SpO2 93%   BMI 29.84 kg/m  BMI: Estimated body mass index is 29.84 kg/m as calculated from the following:   Height as of this encounter: 6' (1.829 m).   Weight as of this encounter: 220 lb (99.8 kg). Ideal: Ideal body weight: 77.6 kg (171 lb 1.2 oz) Adjusted ideal body weight: 86.5 kg (190 lb 10.3 oz) General appearance: Well nourished, well developed, and well hydrated. In no apparent acute distress Mental status: Alert, oriented x 3 (person, place, & time)       Respiratory: No evidence of acute respiratory distress Eyes: PERLA  Musculoskeletal : +LBP  Lumbar Exam  Skin & Axial Inspection: No masses, redness, or swelling Alignment: Symmetrical Functional ROM: Pain restricted ROM affecting primarily the right Stability: No instability detected Muscle Tone/Strength: Functionally intact. No obvious neuro-muscular anomalies detected. Sensory (Neurological): Dermatomal pain pattern Palpation: No palpable anomalies       Provocative  Tests: Hyperextension/rotation test: deferred today       Lumbar quadrant test (Kemp's test): deferred today       Lateral bending test: deferred today       *(Flexion, ABduction and External Rotation)   Gait & Posture Assessment  Ambulation: Unassisted Gait: Relatively normal for age and body habitus Posture: WNL  Lower Extremity Exam      Side: Right lower extremity   Side: Left lower extremity  Stability: No instability observed           Stability: No instability observed  Skin & Extremity Inspection: Skin color, temperature, and hair growth are WNL. No peripheral edema or cyanosis. No masses, redness, swelling, asymmetry, or associated skin lesions. No contractures.   Skin & Extremity Inspection: Skin color, temperature, and hair growth are WNL. No peripheral edema or cyanosis. No masses, redness, swelling, asymmetry, or associated skin lesions. No contractures.  Functional ROM: Unrestricted ROM                   Functional ROM: Unrestricted ROM                  Muscle Tone/Strength: Functionally intact. No obvious neuro-muscular anomalies detected.   Muscle Tone/Strength: Functionally intact. No obvious neuro-muscular anomalies detected.  Sensory (Neurological): Unimpaired         Sensory (Neurological): Dermatomal       DTR: Patellar: deferred today Achilles: deferred today Plantar: deferred today   DTR: Patellar: deferred today Achilles: deferred today Plantar: deferred today  Palpation: No palpable anomalies   Palpation: No palpable anomalies   Assessment   Diagnosis Status  1. Lumbar spondylosis   2. Medication management   3. Chronic pain syndrome   4. Chronic use of opiate for therapeutic purpose   5. Long term prescription opiate use   6. Encounter for long-term opiate analgesic use   7. Chronic right-sided low back pain without sciatica    Controlled Controlled Controlled   Updated Problems: Problem  Encounter for Long-Term Opiate Analgesic Use   Medication Management     Plan of Care  Problem-specific:  Assessment and Plan    Chronic pain syndrome Pain level at 4/10, managed with Oxycontin  and Percocet. Walking beneficial for arthritis and pain management. - Continue Oxycontin  and Percocet. - Encouraged regular walking.  Chronic opioid therapy for pain management Chronic opioid therapy effective without significant side effects. Tizanidine  used as needed for muscle relaxation. - Continue Oxycontin  and Percocet. - Ensure Tizanidine  available as needed. Patient's pain is well-controlled with oxycodone  and OxyContin , will continue on current medication regimen.  Prescribing drug monitoring (PMP) reviewed, findings consistent with the use of prescribed medication and no evidence of narcotic misuse or abuse.  Urine drug screening (UDS) up to date and consistent with the use of medication.  Schedule follow-up in 90 days for medication management.   Constipation secondary to opioid therapy Constipation managed with Linzess  as needed. No recent need for Linzess  reported. - Continue Linzess  as needed.       Mr. Anant Agard. has a current medication list which includes the following long-term medication(s): amlodipine -valsartan -hctz, atorvastatin , linaclotide , omega-3 acid ethyl esters, sildenafil , [START ON 04/24/2024] oxycodone -acetaminophen , [START ON 05/24/2024] oxycodone -acetaminophen , and [START ON 06/23/2024] oxycodone -acetaminophen .  Pharmacotherapy (Medications Ordered): Meds ordered this encounter  Medications   oxyCODONE  (OXYCONTIN ) 20 mg 12 hr tablet    Sig: Take 1 tablet (20 mg total) by mouth every 12 (twelve) hours. Months last 30 days.    Dispense:  60 tablet    Refill:  0    Chronic Pain: STOP Act (Not applicable) Fill 1 day early if closed on refill date. Avoid benzodiazepines within 8 hours of opioids   oxyCODONE  (OXYCONTIN ) 20 mg 12 hr tablet    Sig: Take 1 tablet (20 mg total) by mouth every 12  (twelve) hours. Months last 30 days.    Dispense:  60 tablet    Refill:  0    Chronic Pain: STOP Act (Not applicable) Fill 1 day early if closed on refill date. Avoid  benzodiazepines within 8 hours of opioids   oxyCODONE  (OXYCONTIN ) 20 mg 12 hr tablet    Sig: Take 1 tablet (20 mg total) by mouth every 12 (twelve) hours. Months last 30 days.    Dispense:  60 tablet    Refill:  0    Chronic Pain: STOP Act (Not applicable) Fill 1 day early if closed on refill date. Avoid benzodiazepines within 8 hours of opioids   oxyCODONE -acetaminophen  (PERCOCET) 10-325 MG tablet    Sig: Take 1 tablet by mouth 2 (two) times daily as needed for pain.    Dispense:  60 tablet    Refill:  0   oxyCODONE -acetaminophen  (PERCOCET) 10-325 MG tablet    Sig: Take 1 tablet by mouth 2 (two) times daily as needed for pain.    Dispense:  60 tablet    Refill:  0   oxyCODONE -acetaminophen  (PERCOCET) 10-325 MG tablet    Sig: Take 1 tablet by mouth 2 (two) times daily as needed for pain.    Dispense:  60 tablet    Refill:  0   Orders:  No orders of the defined types were placed in this encounter.       Return in about 3 months (around 07/12/2024) for (F2F), (MM), Emmy Blanch NP.    Recent Visits Date Type Provider Dept  01/19/24 Office Visit Londen Lorge K, NP [facility]  Showing recent visits within past 90 days and meeting all other requirements Today's Visits Date Type Provider Dept  04/13/24 Office Visit Shaquon Gropp K, NP [facility]  Showing today's visits and meeting all other requirements Future Appointments Date Type Provider Dept  07/05/24 Appointment Hena Ewalt K, NP [facility]  Showing future appointments within next 90 days and meeting all other requirements  I discussed the assessment and treatment plan with the patient. The patient was provided an opportunity to ask questions and all were answered. The patient agreed with the plan and demonstrated an understanding  of the instructions.  Patient advised to call back or seek an in-person evaluation if the symptoms or condition worsens.  I personally spent a total of 30 minutes in the care of the patient today including preparing to see the patient, getting/reviewing separately obtained history, performing a medically appropriate exam/evaluation, counseling and educating, placing orders, referring and communicating with other health care professionals, documenting clinical information in the EHR, independently interpreting results, communicating results, and coordinating care.   Note by: Marjani Kobel K Jacolyn Joaquin, NP (TTS and AI technology used. I apologize for any typographical errors that were not detected and corrected.) Date: 04/13/2024; Time: 9:13 AM

## 2024-04-13 ENCOUNTER — Ambulatory Visit: Attending: Nurse Practitioner | Admitting: Nurse Practitioner

## 2024-04-13 ENCOUNTER — Encounter: Payer: Self-pay | Admitting: Nurse Practitioner

## 2024-04-13 VITALS — BP 138/70 | HR 94 | Temp 97.9°F | Resp 20 | Ht 72.0 in | Wt 220.0 lb

## 2024-04-13 DIAGNOSIS — G894 Chronic pain syndrome: Secondary | ICD-10-CM | POA: Insufficient documentation

## 2024-04-13 DIAGNOSIS — M545 Low back pain, unspecified: Secondary | ICD-10-CM | POA: Insufficient documentation

## 2024-04-13 DIAGNOSIS — Z79899 Other long term (current) drug therapy: Secondary | ICD-10-CM | POA: Insufficient documentation

## 2024-04-13 DIAGNOSIS — G8929 Other chronic pain: Secondary | ICD-10-CM | POA: Diagnosis present

## 2024-04-13 DIAGNOSIS — M47816 Spondylosis without myelopathy or radiculopathy, lumbar region: Secondary | ICD-10-CM | POA: Insufficient documentation

## 2024-04-13 DIAGNOSIS — Z79891 Long term (current) use of opiate analgesic: Secondary | ICD-10-CM | POA: Insufficient documentation

## 2024-04-13 MED ORDER — OXYCODONE-ACETAMINOPHEN 10-325 MG PO TABS: 1.0000 | ORAL_TABLET | Freq: Two times a day (BID) | ORAL | 0 refills | Status: AC | PRN

## 2024-04-13 MED ORDER — OXYCODONE HCL ER 20 MG PO T12A: 20.0000 mg | EXTENDED_RELEASE_TABLET | Freq: Two times a day (BID) | ORAL | 0 refills | Status: AC

## 2024-04-13 NOTE — Patient Instructions (Signed)

## 2024-04-13 NOTE — Progress Notes (Signed)
 Nursing Pain Medication Assessment:  Safety precautions to be maintained throughout the outpatient stay will include: orient to surroundings, keep bed in low position, maintain call bell within reach at all times, provide assistance with transfer out of bed and ambulation.  Medication Inspection Compliance: Pill count conducted under aseptic conditions, in front of the patient. Neither the pills nor the bottle was removed from the patient's sight at any time. Once count was completed pills were immediately returned to the patient in their original bottle.  Medication: Oxycontin  20mg  Pill/Patch Count: 26 of 60 pills/patches remain Pill/Patch Appearance: Markings consistent with prescribed medication Bottle Appearance: Standard pharmacy container. Clearly labeled. Filled Date: 43 / 77 / 2025 Last Medication intake:  Yesterday

## 2024-04-13 NOTE — Progress Notes (Signed)
 Nursing Pain Medication Assessment:  Safety precautions to be maintained throughout the outpatient stay will include: orient to surroundings, keep bed in low position, maintain call bell within reach at all times, provide assistance with transfer out of bed and ambulation.  Medication Inspection Compliance: Pill count conducted under aseptic conditions, in front of the patient. Neither the pills nor the bottle was removed from the patient's sight at any time. Once count was completed pills were immediately returned to the patient in their original bottle.  Medication: Oxycodone /APAP Pill/Patch Count: 26 of 60 pills/patches remain Pill/Patch Appearance: Markings consistent with prescribed medication Bottle Appearance: Standard pharmacy container. Clearly labeled. Filled Date: 59 / 89 / 2025 Last Medication intake:  Yesterday

## 2024-05-09 ENCOUNTER — Ambulatory Visit: Payer: Self-pay | Admitting: Family Medicine

## 2024-05-09 ENCOUNTER — Encounter: Payer: Self-pay | Admitting: Family Medicine

## 2024-05-09 VITALS — BP 122/74 | HR 74 | Resp 16 | Ht 72.0 in | Wt 227.7 lb

## 2024-05-09 DIAGNOSIS — Z0000 Encounter for general adult medical examination without abnormal findings: Secondary | ICD-10-CM

## 2024-05-09 DIAGNOSIS — E1169 Type 2 diabetes mellitus with other specified complication: Secondary | ICD-10-CM

## 2024-05-09 DIAGNOSIS — N521 Erectile dysfunction due to diseases classified elsewhere: Secondary | ICD-10-CM | POA: Diagnosis not present

## 2024-05-09 DIAGNOSIS — Z0001 Encounter for general adult medical examination with abnormal findings: Secondary | ICD-10-CM | POA: Diagnosis not present

## 2024-05-09 MED ORDER — SILDENAFIL CITRATE 100 MG PO TABS: 100.0000 mg | ORAL_TABLET | Freq: Every day | ORAL | 0 refills | Status: AC | PRN

## 2024-05-09 NOTE — Patient Instructions (Signed)
 Preventive Care 73 Years and Older, Male Preventive care refers to lifestyle choices and visits with your health care provider that can promote health and wellness. Preventive care visits are also called wellness exams. What can I expect for my preventive care visit? Counseling During your preventive care visit, your health care provider may ask about your: Medical history, including: Past medical problems. Family medical history. History of falls. Current health, including: Emotional well-being. Home life and relationship well-being. Sexual activity. Memory and ability to understand (cognition). Lifestyle, including: Alcohol, nicotine or tobacco, and drug use. Access to firearms. Diet, exercise, and sleep habits. Work and work Astronomer. Sunscreen use. Safety issues such as seatbelt and bike helmet use. Physical exam Your health care provider will check your: Height and weight. These may be used to calculate your BMI (body mass index). BMI is a measurement that tells if you are at a healthy weight. Waist circumference. This measures the distance around your waistline. This measurement also tells if you are at a healthy weight and may help predict your risk of certain diseases, such as type 2 diabetes and high blood pressure. Heart rate and blood pressure. Body temperature. Skin for abnormal spots. What immunizations do I need?  Vaccines are usually given at various ages, according to a schedule. Your health care provider will recommend vaccines for you based on your age, medical history, and lifestyle or other factors, such as travel or where you work. What tests do I need? Screening Your health care provider may recommend screening tests for certain conditions. This may include: Lipid and cholesterol levels. Diabetes screening. This is done by checking your blood sugar (glucose) after you have not eaten for a while (fasting). Hepatitis C test. Hepatitis B test. HIV (human  immunodeficiency virus) test. STI (sexually transmitted infection) testing, if you are at risk. Lung cancer screening. Colorectal cancer screening. Prostate cancer screening. Abdominal aortic aneurysm (AAA) screening. You may need this if you are a current or former smoker. Talk with your health care provider about your test results, treatment options, and if necessary, the need for more tests. Follow these instructions at home: Eating and drinking  Eat a diet that includes fresh fruits and vegetables, whole grains, lean protein, and low-fat dairy products. Limit your intake of foods with high amounts of sugar, saturated fats, and salt. Take vitamin and mineral supplements as recommended by your health care provider. Do not drink alcohol if your health care provider tells you not to drink. If you drink alcohol: Limit how much you have to 0-2 drinks a day. Know how much alcohol is in your drink. In the [place] one drink equals one 12 oz bottle of beer (355 mL), one 5 oz glass of wine (148 mL), or one 1 oz glass of hard liquor (44 mL). Lifestyle Brush your teeth every morning and night with fluoride toothpaste. Floss one time each day. Exercise for at least 30 minutes 5 or more days each week. Do not use any products that contain nicotine or tobacco. These products include cigarettes, chewing tobacco, and vaping devices, such as e-cigarettes. If you need help quitting, ask your health care provider. Do not use drugs. If you are sexually active, practice safe sex. Use a condom or other form of protection to prevent STIs. Take aspirin only as told by your health care provider. Make sure that you understand how much to take and what form to take. Work with your health care provider to find out whether it is safe  and beneficial for you to take aspirin daily. Ask your health care provider if you need to take a cholesterol-lowering medicine (statin). Find healthy ways to manage stress, such  as: Meditation, yoga, or listening to music. Journaling. Talking to a trusted person. Spending time with friends and family. Safety Always wear your seat belt while driving or riding in a vehicle. Do not drive: If you have been drinking alcohol. Do not ride with someone who has been drinking. When you are tired or distracted. While texting. If you have been using any mind-altering substances or drugs. Wear a helmet and other protective equipment during sports activities. If you have firearms in your house, make sure you follow all gun safety procedures. Minimize exposure to UV radiation to reduce your risk of skin cancer. What's next? Visit your health care provider once a year for an annual wellness visit. Ask your health care provider how often you should have your eyes and teeth checked. Stay up to date on all vaccines. This information is not intended to replace advice given to you by your health care provider. Make sure you discuss any questions you have with your health care provider. Document Revised: 09/18/2020 Document Reviewed: 09/18/2020 [employer] Patient Education  2024 [employer].

## 2024-05-10 ENCOUNTER — Ambulatory Visit: Payer: Self-pay | Admitting: Family Medicine

## 2024-05-10 LAB — COMPREHENSIVE METABOLIC PANEL WITH GFR
AG Ratio: 1.7 (calc) (ref 1.0–2.5)
ALT: 19 U/L (ref 9–46)
AST: 17 U/L (ref 10–35)
Albumin: 4.8 g/dL (ref 3.6–5.1)
Alkaline phosphatase (APISO): 82 U/L (ref 35–144)
BUN: 9 mg/dL (ref 7–25)
CO2: 31 mmol/L (ref 20–32)
Calcium: 10.2 mg/dL (ref 8.6–10.3)
Chloride: 100 mmol/L (ref 98–110)
Creat: 0.74 mg/dL (ref 0.70–1.35)
Globulin: 2.8 g/dL (ref 1.9–3.7)
Glucose, Bld: 91 mg/dL (ref 65–99)
Potassium: 4.4 mmol/L (ref 3.5–5.3)
Sodium: 138 mmol/L (ref 135–146)
Total Bilirubin: 0.6 mg/dL (ref 0.2–1.2)
Total Protein: 7.6 g/dL (ref 6.1–8.1)
eGFR: 99 mL/min/{1.73_m2}

## 2024-05-10 LAB — CBC WITH DIFFERENTIAL/PLATELET
Absolute Lymphocytes: 2470 {cells}/uL (ref 850–3900)
Absolute Monocytes: 608 {cells}/uL (ref 200–950)
Basophils Absolute: 30 {cells}/uL (ref 0–200)
Basophils Relative: 0.4 %
Eosinophils Absolute: 152 {cells}/uL (ref 15–500)
Eosinophils Relative: 2 %
HCT: 43.7 % (ref 39.4–51.1)
Hemoglobin: 14.9 g/dL (ref 13.2–17.1)
MCH: 30.8 pg (ref 27.0–33.0)
MCHC: 34.1 g/dL (ref 31.6–35.4)
MCV: 90.5 fL (ref 81.4–101.7)
MPV: 9 fL (ref 7.5–12.5)
Monocytes Relative: 8 %
Neutro Abs: 4340 {cells}/uL (ref 1500–7800)
Neutrophils Relative %: 57.1 %
Platelets: 341 10*3/uL (ref 140–400)
RBC: 4.83 Million/uL (ref 4.20–5.80)
RDW: 12 % (ref 11.0–15.0)
Total Lymphocyte: 32.5 %
WBC: 7.6 10*3/uL (ref 3.8–10.8)

## 2024-05-10 LAB — LIPID PANEL
Cholesterol: 109 mg/dL
HDL: 46 mg/dL
LDL Cholesterol (Calc): 47 mg/dL
Non-HDL Cholesterol (Calc): 63 mg/dL
Total CHOL/HDL Ratio: 2.4 (calc)
Triglycerides: 75 mg/dL

## 2024-05-10 LAB — MICROALBUMIN / CREATININE URINE RATIO
Creatinine, Urine: 37 mg/dL (ref 20–320)
Microalb, Ur: <0.2 mg/dL

## 2024-05-10 LAB — PSA: PSA: 0.22 ng/mL

## 2024-07-05 ENCOUNTER — Encounter: Admitting: Nurse Practitioner

## 2024-08-10 ENCOUNTER — Ambulatory Visit

## 2025-05-10 ENCOUNTER — Encounter: Admitting: Family Medicine
